# Patient Record
Sex: Female | Born: 1960
Health system: Southern US, Community
[De-identification: ages and names within clinical notes are randomized; demographics above are authoritative.]

## PROBLEM LIST (undated history)

## (undated) DIAGNOSIS — F32A Depression, unspecified: Secondary | ICD-10-CM

## (undated) DIAGNOSIS — K219 Gastro-esophageal reflux disease without esophagitis: Secondary | ICD-10-CM

## (undated) DIAGNOSIS — F319 Bipolar disorder, unspecified: Secondary | ICD-10-CM

## (undated) DIAGNOSIS — F419 Anxiety disorder, unspecified: Secondary | ICD-10-CM

## (undated) DIAGNOSIS — F329 Major depressive disorder, single episode, unspecified: Secondary | ICD-10-CM

## (undated) DIAGNOSIS — R0902 Hypoxemia: Secondary | ICD-10-CM

## (undated) DIAGNOSIS — T7840XA Allergy, unspecified, initial encounter: Secondary | ICD-10-CM

## (undated) DIAGNOSIS — I499 Cardiac arrhythmia, unspecified: Secondary | ICD-10-CM

## (undated) DIAGNOSIS — M1990 Unspecified osteoarthritis, unspecified site: Secondary | ICD-10-CM

## (undated) DIAGNOSIS — I219 Acute myocardial infarction, unspecified: Secondary | ICD-10-CM

## (undated) DIAGNOSIS — C801 Malignant (primary) neoplasm, unspecified: Secondary | ICD-10-CM

## (undated) DIAGNOSIS — F5105 Insomnia due to other mental disorder: Secondary | ICD-10-CM

## (undated) DIAGNOSIS — IMO0002 Reserved for concepts with insufficient information to code with codable children: Secondary | ICD-10-CM

## (undated) DIAGNOSIS — D689 Coagulation defect, unspecified: Secondary | ICD-10-CM

## (undated) DIAGNOSIS — E785 Hyperlipidemia, unspecified: Secondary | ICD-10-CM

## (undated) DIAGNOSIS — Z5189 Encounter for other specified aftercare: Secondary | ICD-10-CM

## (undated) DIAGNOSIS — D649 Anemia, unspecified: Secondary | ICD-10-CM

## (undated) DIAGNOSIS — M6740 Ganglion, unspecified site: Secondary | ICD-10-CM

## (undated) DIAGNOSIS — E039 Hypothyroidism, unspecified: Secondary | ICD-10-CM

## (undated) DIAGNOSIS — N189 Chronic kidney disease, unspecified: Secondary | ICD-10-CM

## (undated) DIAGNOSIS — Z800 Family history of malignant neoplasm of digestive organs: Secondary | ICD-10-CM

## (undated) DIAGNOSIS — G4730 Sleep apnea, unspecified: Secondary | ICD-10-CM

## (undated) HISTORY — DX: Unspecified osteoarthritis, unspecified site: M19.90

## (undated) HISTORY — PX: ABDOMINAL HYSTERECTOMY: SHX81

## (undated) HISTORY — DX: Allergy, unspecified, initial encounter: T78.40XA

## (undated) HISTORY — DX: Acute myocardial infarction, unspecified: I21.9

## (undated) HISTORY — DX: Hypoxemia: R09.02

## (undated) HISTORY — DX: Encounter for other specified aftercare: Z51.89

## (undated) HISTORY — PX: ABDOMINAL HYSTERECTOMY: SUR658

## (undated) HISTORY — PX: COLON SURGERY: SHX602

## (undated) HISTORY — DX: Insomnia due to other mental disorder: F51.05

## (undated) HISTORY — DX: Family history of malignant neoplasm of digestive organs: Z80.0

## (undated) HISTORY — PX: KNEE DISLOCATION SURGERY: SHX689

## (undated) HISTORY — PX: BREAST SURGERY: SHX581

## (undated) HISTORY — DX: Coagulation defect, unspecified: D68.9

## (undated) HISTORY — PX: HERNIA REPAIR: SHX51

## (undated) HISTORY — DX: Malignant (primary) neoplasm, unspecified: C80.1

## (undated) HISTORY — DX: Reserved for concepts with insufficient information to code with codable children: IMO0002

## (undated) HISTORY — DX: Sleep apnea, unspecified: G47.30

## (undated) HISTORY — DX: Gastro-esophageal reflux disease without esophagitis: K21.9

## (undated) HISTORY — DX: Hypothyroidism, unspecified: E03.9

## (undated) HISTORY — DX: Ganglion, unspecified site: M67.40

## (undated) HISTORY — PX: COLONOSCOPY: SHX174

## (undated) HISTORY — DX: Bipolar disorder, unspecified: F31.9

## (undated) HISTORY — DX: Chronic kidney disease, unspecified: N18.9

## (undated) HISTORY — DX: Depression, unspecified: F32.A

## (undated) HISTORY — DX: Anemia, unspecified: D64.9

## (undated) HISTORY — PX: GANGLION CYST EXCISION: SHX1691

## (undated) HISTORY — DX: Hyperlipidemia, unspecified: E78.5

## (undated) HISTORY — DX: Anxiety disorder, unspecified: F41.9

## (undated) HISTORY — DX: Cardiac arrhythmia, unspecified: I49.9

## (undated) HISTORY — DX: Major depressive disorder, single episode, unspecified: F32.9

---

## 1995-02-14 HISTORY — PX: LAPAROSCOPY: SHX197

## 1998-02-24 ENCOUNTER — Encounter: Admission: RE | Admit: 1998-02-24 | Discharge: 1998-02-24 | Payer: Self-pay | Admitting: *Deleted

## 2001-09-24 ENCOUNTER — Encounter (INDEPENDENT_AMBULATORY_CARE_PROVIDER_SITE_OTHER): Payer: Self-pay | Admitting: *Deleted

## 2001-09-24 ENCOUNTER — Ambulatory Visit (HOSPITAL_BASED_OUTPATIENT_CLINIC_OR_DEPARTMENT_OTHER): Admission: RE | Admit: 2001-09-24 | Discharge: 2001-09-24 | Payer: Self-pay | Admitting: Orthopedic Surgery

## 2004-02-14 HISTORY — PX: DILATION AND CURETTAGE OF UTERUS: SHX78

## 2004-03-20 ENCOUNTER — Emergency Department (HOSPITAL_COMMUNITY): Admission: EM | Admit: 2004-03-20 | Discharge: 2004-03-20 | Payer: Self-pay | Admitting: Family Medicine

## 2004-03-28 ENCOUNTER — Inpatient Hospital Stay (HOSPITAL_COMMUNITY): Admission: AD | Admit: 2004-03-28 | Discharge: 2004-03-28 | Payer: Self-pay | Admitting: Obstetrics & Gynecology

## 2004-03-29 ENCOUNTER — Inpatient Hospital Stay (HOSPITAL_COMMUNITY): Admission: AD | Admit: 2004-03-29 | Discharge: 2004-03-29 | Payer: Self-pay | Admitting: Obstetrics and Gynecology

## 2004-04-07 ENCOUNTER — Encounter (INDEPENDENT_AMBULATORY_CARE_PROVIDER_SITE_OTHER): Payer: Self-pay | Admitting: *Deleted

## 2004-04-07 ENCOUNTER — Ambulatory Visit (HOSPITAL_COMMUNITY): Admission: RE | Admit: 2004-04-07 | Discharge: 2004-04-07 | Payer: Self-pay | Admitting: Obstetrics and Gynecology

## 2004-10-28 ENCOUNTER — Ambulatory Visit: Payer: Self-pay | Admitting: Family Medicine

## 2004-11-08 ENCOUNTER — Ambulatory Visit: Payer: Self-pay | Admitting: Gastroenterology

## 2004-11-09 ENCOUNTER — Ambulatory Visit: Payer: Self-pay | Admitting: Gastroenterology

## 2004-11-14 ENCOUNTER — Emergency Department: Payer: Self-pay | Admitting: Emergency Medicine

## 2005-01-03 ENCOUNTER — Ambulatory Visit: Payer: Self-pay | Admitting: Family Medicine

## 2005-02-03 ENCOUNTER — Ambulatory Visit: Payer: Self-pay | Admitting: Family Medicine

## 2005-04-06 ENCOUNTER — Ambulatory Visit: Payer: Self-pay | Admitting: Family Medicine

## 2005-06-02 ENCOUNTER — Ambulatory Visit: Payer: Self-pay | Admitting: Family Medicine

## 2005-06-22 ENCOUNTER — Ambulatory Visit: Payer: Self-pay | Admitting: Family Medicine

## 2005-12-22 ENCOUNTER — Ambulatory Visit: Payer: Self-pay | Admitting: Family Medicine

## 2006-01-15 ENCOUNTER — Ambulatory Visit: Payer: Self-pay | Admitting: Family Medicine

## 2006-05-31 ENCOUNTER — Ambulatory Visit (HOSPITAL_COMMUNITY): Admission: RE | Admit: 2006-05-31 | Discharge: 2006-05-31 | Payer: Self-pay | Admitting: Obstetrics and Gynecology

## 2006-06-06 ENCOUNTER — Inpatient Hospital Stay (HOSPITAL_COMMUNITY): Admission: AD | Admit: 2006-06-06 | Discharge: 2006-06-06 | Payer: Self-pay | Admitting: Obstetrics and Gynecology

## 2006-08-13 ENCOUNTER — Telehealth (INDEPENDENT_AMBULATORY_CARE_PROVIDER_SITE_OTHER): Payer: Self-pay | Admitting: *Deleted

## 2006-09-12 ENCOUNTER — Ambulatory Visit: Payer: Self-pay | Admitting: Family Medicine

## 2006-09-12 DIAGNOSIS — J301 Allergic rhinitis due to pollen: Secondary | ICD-10-CM | POA: Insufficient documentation

## 2006-09-12 DIAGNOSIS — E039 Hypothyroidism, unspecified: Secondary | ICD-10-CM | POA: Insufficient documentation

## 2006-09-12 DIAGNOSIS — E119 Type 2 diabetes mellitus without complications: Secondary | ICD-10-CM | POA: Insufficient documentation

## 2006-09-12 DIAGNOSIS — F418 Other specified anxiety disorders: Secondary | ICD-10-CM | POA: Insufficient documentation

## 2006-09-12 DIAGNOSIS — N6019 Diffuse cystic mastopathy of unspecified breast: Secondary | ICD-10-CM | POA: Insufficient documentation

## 2006-09-12 DIAGNOSIS — Z87898 Personal history of other specified conditions: Secondary | ICD-10-CM | POA: Insufficient documentation

## 2006-09-12 DIAGNOSIS — E118 Type 2 diabetes mellitus with unspecified complications: Secondary | ICD-10-CM | POA: Insufficient documentation

## 2006-09-12 DIAGNOSIS — E1169 Type 2 diabetes mellitus with other specified complication: Secondary | ICD-10-CM | POA: Insufficient documentation

## 2006-11-07 ENCOUNTER — Encounter (INDEPENDENT_AMBULATORY_CARE_PROVIDER_SITE_OTHER): Payer: Self-pay | Admitting: Surgery

## 2006-11-07 ENCOUNTER — Ambulatory Visit (HOSPITAL_BASED_OUTPATIENT_CLINIC_OR_DEPARTMENT_OTHER): Admission: RE | Admit: 2006-11-07 | Discharge: 2006-11-07 | Payer: Self-pay | Admitting: Surgery

## 2006-12-12 ENCOUNTER — Inpatient Hospital Stay (HOSPITAL_COMMUNITY): Admission: RE | Admit: 2006-12-12 | Discharge: 2006-12-15 | Payer: Self-pay | Admitting: Obstetrics and Gynecology

## 2006-12-12 ENCOUNTER — Encounter (INDEPENDENT_AMBULATORY_CARE_PROVIDER_SITE_OTHER): Payer: Self-pay | Admitting: Obstetrics and Gynecology

## 2007-01-02 ENCOUNTER — Ambulatory Visit: Payer: Self-pay | Admitting: Family Medicine

## 2007-02-08 ENCOUNTER — Ambulatory Visit: Payer: Self-pay | Admitting: Family Medicine

## 2007-02-08 LAB — CONVERTED CEMR LAB
Bilirubin Urine: NEGATIVE
Nitrite: NEGATIVE
Specific Gravity, Urine: 1.025
Urobilinogen, UA: 0.2
WBC Urine, dipstick: NEGATIVE
pH: 5

## 2007-02-11 ENCOUNTER — Telehealth: Payer: Self-pay | Admitting: Family Medicine

## 2007-06-10 ENCOUNTER — Encounter: Payer: Self-pay | Admitting: Family Medicine

## 2007-06-14 ENCOUNTER — Telehealth: Payer: Self-pay | Admitting: Family Medicine

## 2007-06-17 ENCOUNTER — Ambulatory Visit: Payer: Self-pay | Admitting: Family Medicine

## 2007-06-17 LAB — CONVERTED CEMR LAB
Bacteria, UA: 0
Bilirubin Urine: NEGATIVE
Blood in Urine, dipstick: NEGATIVE
Casts: 0 /lpf
Epithelial cells, urine: 0 /lpf
Glucose, Urine, Semiquant: NEGATIVE
Nitrite: NEGATIVE
RBC / HPF: 1
Specific Gravity, Urine: >=1.03
pH: 6

## 2007-06-18 ENCOUNTER — Encounter: Payer: Self-pay | Admitting: Family Medicine

## 2007-07-01 ENCOUNTER — Telehealth: Payer: Self-pay | Admitting: Family Medicine

## 2007-07-05 ENCOUNTER — Ambulatory Visit: Payer: Self-pay | Admitting: Family Medicine

## 2007-08-02 ENCOUNTER — Ambulatory Visit: Payer: Self-pay | Admitting: Family Medicine

## 2007-08-03 LAB — CONVERTED CEMR LAB
AST: 31 units/L (ref 0–37)
Albumin: 3.8 g/dL (ref 3.5–5.2)
BUN: 13 mg/dL (ref 6–23)
Calcium: 9.8 mg/dL (ref 8.4–10.5)
Glucose, Bld: 120 mg/dL — ABNORMAL HIGH (ref 70–99)
HCT: 39.4 % (ref 36.0–46.0)
Hgb A1c MFr Bld: 6.5 % — ABNORMAL HIGH (ref 4.6–6.0)
Lymphocytes Relative: 36.1 % (ref 12.0–46.0)
Monocytes Absolute: 0.3 10*3/uL (ref 0.1–1.0)
Monocytes Relative: 5.5 % (ref 3.0–12.0)
Neutro Abs: 2.8 10*3/uL (ref 1.4–7.7)
Neutrophils Relative %: 55 % (ref 43.0–77.0)
Phosphorus: 4.7 mg/dL — ABNORMAL HIGH (ref 2.3–4.6)
Platelets: 247 10*3/uL (ref 150–400)
Sodium: 140 meq/L (ref 135–145)
Total CHOL/HDL Ratio: 6.9
Triglycerides: 162 mg/dL — ABNORMAL HIGH (ref 0–149)

## 2007-08-06 ENCOUNTER — Ambulatory Visit: Payer: Self-pay | Admitting: Professional

## 2008-02-14 DIAGNOSIS — C20 Malignant neoplasm of rectum: Secondary | ICD-10-CM | POA: Insufficient documentation

## 2008-06-08 ENCOUNTER — Ambulatory Visit: Payer: Self-pay | Admitting: Family Medicine

## 2008-06-08 LAB — CONVERTED CEMR LAB
Bilirubin Urine: NEGATIVE
Nitrite: NEGATIVE
Protein, U semiquant: NEGATIVE
Specific Gravity, Urine: 1.015
Urobilinogen, UA: 0.2
WBC Urine, dipstick: NEGATIVE

## 2008-06-12 LAB — CONVERTED CEMR LAB
ALT: 17 units/L (ref 0–35)
Cholesterol: 215 mg/dL — ABNORMAL HIGH (ref 0–200)
Creatinine, Ser: 0.7 mg/dL (ref 0.4–1.2)
Glucose, Bld: 107 mg/dL — ABNORMAL HIGH (ref 70–99)
HDL: 37.2 mg/dL — ABNORMAL LOW (ref >39.00)
Sodium: 144 meq/L (ref 135–145)
TSH: 0.15 microintl units/mL — ABNORMAL LOW (ref 0.35–5.50)
Total CHOL/HDL Ratio: 6

## 2008-06-15 ENCOUNTER — Encounter: Admission: RE | Admit: 2008-06-15 | Discharge: 2008-09-13 | Payer: Self-pay | Admitting: Family Medicine

## 2008-06-19 ENCOUNTER — Ambulatory Visit: Payer: Self-pay | Admitting: Family Medicine

## 2008-06-19 LAB — CONVERTED CEMR LAB
Bilirubin Urine: NEGATIVE
Blood in Urine, dipstick: NEGATIVE
Ketones, urine, test strip: NEGATIVE
Nitrite: NEGATIVE
Urobilinogen, UA: 0.2
pH: 5

## 2008-06-24 LAB — CONVERTED CEMR LAB
Basophils Relative: 0.7 % (ref 0.0–3.0)
Eosinophils Absolute: 0.3 10*3/uL (ref 0.0–0.7)
Eosinophils Relative: 3.6 % (ref 0.0–5.0)
HCT: 37.9 % (ref 36.0–46.0)
Hemoglobin: 12.9 g/dL (ref 12.0–15.0)
Lymphocytes Relative: 32.5 % (ref 12.0–46.0)
MCV: 89.7 fL (ref 78.0–100.0)
Neutrophils Relative %: 59.3 % (ref 43.0–77.0)

## 2008-07-08 ENCOUNTER — Ambulatory Visit: Payer: Self-pay | Admitting: Family Medicine

## 2008-07-08 DIAGNOSIS — E785 Hyperlipidemia, unspecified: Secondary | ICD-10-CM

## 2008-07-08 DIAGNOSIS — E1169 Type 2 diabetes mellitus with other specified complication: Secondary | ICD-10-CM | POA: Insufficient documentation

## 2008-07-10 LAB — CONVERTED CEMR LAB: TSH: 3.54 microintl units/mL (ref 0.35–5.50)

## 2008-07-14 HISTORY — PX: COLON RESECTION: SHX5231

## 2008-07-16 ENCOUNTER — Ambulatory Visit: Payer: Self-pay | Admitting: Gastroenterology

## 2008-07-23 ENCOUNTER — Telehealth: Payer: Self-pay | Admitting: Gastroenterology

## 2008-07-28 ENCOUNTER — Ambulatory Visit: Payer: Self-pay | Admitting: Gastroenterology

## 2008-08-03 ENCOUNTER — Telehealth: Payer: Self-pay | Admitting: Gastroenterology

## 2008-08-03 ENCOUNTER — Ambulatory Visit: Payer: Self-pay | Admitting: Gastroenterology

## 2008-08-03 ENCOUNTER — Encounter: Payer: Self-pay | Admitting: Gastroenterology

## 2008-08-04 ENCOUNTER — Telehealth: Payer: Self-pay | Admitting: Gastroenterology

## 2008-08-05 ENCOUNTER — Ambulatory Visit: Payer: Self-pay | Admitting: Cardiology

## 2008-08-11 ENCOUNTER — Ambulatory Visit: Payer: Self-pay | Admitting: Gastroenterology

## 2008-08-11 ENCOUNTER — Encounter: Payer: Self-pay | Admitting: Family Medicine

## 2008-08-11 DIAGNOSIS — C189 Malignant neoplasm of colon, unspecified: Secondary | ICD-10-CM | POA: Insufficient documentation

## 2008-08-11 DIAGNOSIS — C187 Malignant neoplasm of sigmoid colon: Secondary | ICD-10-CM | POA: Insufficient documentation

## 2008-08-11 DIAGNOSIS — Z85038 Personal history of other malignant neoplasm of large intestine: Secondary | ICD-10-CM | POA: Insufficient documentation

## 2008-08-13 ENCOUNTER — Encounter: Payer: Self-pay | Admitting: Family Medicine

## 2008-08-18 ENCOUNTER — Encounter (INDEPENDENT_AMBULATORY_CARE_PROVIDER_SITE_OTHER): Payer: Self-pay | Admitting: Surgery

## 2008-08-18 ENCOUNTER — Inpatient Hospital Stay (HOSPITAL_COMMUNITY): Admission: RE | Admit: 2008-08-18 | Discharge: 2008-08-26 | Payer: Self-pay | Admitting: Surgery

## 2008-08-28 ENCOUNTER — Ambulatory Visit: Payer: Self-pay | Admitting: Genetic Counselor

## 2008-08-29 ENCOUNTER — Emergency Department (HOSPITAL_COMMUNITY): Admission: EM | Admit: 2008-08-29 | Discharge: 2008-08-29 | Payer: Self-pay | Admitting: Emergency Medicine

## 2008-09-01 ENCOUNTER — Encounter: Payer: Self-pay | Admitting: Gastroenterology

## 2008-09-01 ENCOUNTER — Ambulatory Visit: Payer: Self-pay | Admitting: Oncology

## 2008-09-07 ENCOUNTER — Ambulatory Visit: Payer: Self-pay | Admitting: Family Medicine

## 2008-09-08 LAB — CONVERTED CEMR LAB
Albumin: 3.8 g/dL (ref 3.5–5.2)
Calcium: 9.9 mg/dL (ref 8.4–10.5)
Cholesterol: 197 mg/dL (ref 0–200)
Creatinine, Ser: 0.7 mg/dL (ref 0.4–1.2)
Glucose, Bld: 126 mg/dL — ABNORMAL HIGH (ref 70–99)
Hgb A1c MFr Bld: 6.4 % (ref 4.6–6.5)
Phosphorus: 4.3 mg/dL (ref 2.3–4.6)
Total CHOL/HDL Ratio: 6
Triglycerides: 248 mg/dL — ABNORMAL HIGH (ref 0.0–149.0)

## 2008-09-11 ENCOUNTER — Ambulatory Visit: Payer: Self-pay | Admitting: Family Medicine

## 2008-09-14 ENCOUNTER — Ambulatory Visit (HOSPITAL_COMMUNITY): Admission: RE | Admit: 2008-09-14 | Discharge: 2008-09-14 | Payer: Self-pay | Admitting: Gastroenterology

## 2008-09-17 ENCOUNTER — Encounter: Payer: Self-pay | Admitting: Family Medicine

## 2008-09-17 LAB — CBC WITH DIFFERENTIAL/PLATELET
Eosinophils Absolute: 0.2 10*3/uL (ref 0.0–0.5)
HCT: 36.3 % (ref 34.8–46.6)
LYMPH%: 35.9 % (ref 14.0–49.7)
MONO#: 0.3 10*3/uL (ref 0.1–0.9)
NEUT#: 3.6 10*3/uL (ref 1.5–6.5)
NEUT%: 56.5 % (ref 38.4–76.8)
Platelets: 270 10*3/uL (ref 145–400)
WBC: 6.4 10*3/uL (ref 3.9–10.3)

## 2008-09-17 LAB — COMPREHENSIVE METABOLIC PANEL
BUN: 14 mg/dL (ref 6–23)
CO2: 28 mEq/L (ref 19–32)
Creatinine, Ser: 0.57 mg/dL (ref 0.40–1.20)
Glucose, Bld: 143 mg/dL — ABNORMAL HIGH (ref 70–99)
Total Bilirubin: 0.5 mg/dL (ref 0.3–1.2)
Total Protein: 7 g/dL (ref 6.0–8.3)

## 2008-09-17 LAB — LACTATE DEHYDROGENASE: LDH: 97 U/L (ref 94–250)

## 2008-09-17 LAB — CEA: CEA: 1.3 ng/mL (ref 0.0–5.0)

## 2008-09-18 ENCOUNTER — Encounter: Payer: Self-pay | Admitting: Gastroenterology

## 2008-09-28 ENCOUNTER — Telehealth: Payer: Self-pay | Admitting: Gastroenterology

## 2008-09-29 ENCOUNTER — Encounter: Payer: Self-pay | Admitting: Gastroenterology

## 2008-10-01 ENCOUNTER — Ambulatory Visit: Payer: Self-pay | Admitting: Gastroenterology

## 2008-10-01 ENCOUNTER — Encounter (INDEPENDENT_AMBULATORY_CARE_PROVIDER_SITE_OTHER): Payer: Self-pay | Admitting: *Deleted

## 2008-10-02 ENCOUNTER — Encounter: Admission: RE | Admit: 2008-10-02 | Discharge: 2008-10-02 | Payer: Self-pay | Admitting: Surgery

## 2008-10-02 ENCOUNTER — Encounter (INDEPENDENT_AMBULATORY_CARE_PROVIDER_SITE_OTHER): Payer: Self-pay | Admitting: *Deleted

## 2008-10-05 ENCOUNTER — Encounter: Payer: Self-pay | Admitting: Gastroenterology

## 2008-10-09 ENCOUNTER — Encounter: Payer: Self-pay | Admitting: Gastroenterology

## 2008-10-14 ENCOUNTER — Ambulatory Visit: Payer: Self-pay | Admitting: Genetic Counselor

## 2008-10-14 ENCOUNTER — Encounter: Payer: Self-pay | Admitting: Gastroenterology

## 2008-11-10 ENCOUNTER — Encounter: Payer: Self-pay | Admitting: Gastroenterology

## 2008-11-27 ENCOUNTER — Encounter: Payer: Self-pay | Admitting: Gastroenterology

## 2008-12-11 ENCOUNTER — Ambulatory Visit: Payer: Self-pay | Admitting: Family Medicine

## 2008-12-11 ENCOUNTER — Telehealth (INDEPENDENT_AMBULATORY_CARE_PROVIDER_SITE_OTHER): Payer: Self-pay | Admitting: *Deleted

## 2008-12-15 LAB — CONVERTED CEMR LAB
ALT: 16 units/L (ref 0–35)
AST: 16 units/L (ref 0–37)
BUN: 12 mg/dL (ref 6–23)
CO2: 29 meq/L (ref 19–32)
Chloride: 105 meq/L (ref 96–112)
Cholesterol: 201 mg/dL — ABNORMAL HIGH (ref 0–200)
Direct LDL: 147.2 mg/dL
Potassium: 4.6 meq/L (ref 3.5–5.1)
Sodium: 144 meq/L (ref 135–145)
VLDL: 23.4 mg/dL (ref 0.0–40.0)

## 2008-12-25 ENCOUNTER — Encounter: Admission: RE | Admit: 2008-12-25 | Discharge: 2008-12-25 | Payer: Self-pay | Admitting: Surgery

## 2008-12-28 ENCOUNTER — Encounter (INDEPENDENT_AMBULATORY_CARE_PROVIDER_SITE_OTHER): Payer: Self-pay | Admitting: *Deleted

## 2008-12-28 ENCOUNTER — Encounter: Admission: RE | Admit: 2008-12-28 | Discharge: 2008-12-28 | Payer: Self-pay | Admitting: Surgery

## 2009-01-05 ENCOUNTER — Encounter: Payer: Self-pay | Admitting: Gastroenterology

## 2009-01-05 ENCOUNTER — Telehealth: Payer: Self-pay | Admitting: Gastroenterology

## 2009-01-09 ENCOUNTER — Encounter: Payer: Self-pay | Admitting: Family Medicine

## 2009-01-13 ENCOUNTER — Ambulatory Visit: Payer: Self-pay | Admitting: Oncology

## 2009-01-14 ENCOUNTER — Ambulatory Visit: Payer: Self-pay | Admitting: Gastroenterology

## 2009-01-15 ENCOUNTER — Ambulatory Visit: Payer: Self-pay | Admitting: Gastroenterology

## 2009-01-15 LAB — HM SIGMOIDOSCOPY

## 2009-01-25 ENCOUNTER — Encounter: Payer: Self-pay | Admitting: Family Medicine

## 2009-01-25 LAB — COMPREHENSIVE METABOLIC PANEL
ALT: 11 U/L (ref 0–35)
AST: 15 U/L (ref 0–37)
Albumin: 4 g/dL (ref 3.5–5.2)
BUN: 15 mg/dL (ref 6–23)
Calcium: 9.1 mg/dL (ref 8.4–10.5)
Chloride: 103 mEq/L (ref 96–112)
Potassium: 4.1 mEq/L (ref 3.5–5.3)

## 2009-01-25 LAB — CBC WITH DIFFERENTIAL/PLATELET
BASO%: 0.7 % (ref 0.0–2.0)
Eosinophils Absolute: 0.1 10*3/uL (ref 0.0–0.5)
HCT: 36.3 % (ref 34.8–46.6)
MCHC: 33.7 g/dL (ref 31.5–36.0)
MONO#: 0.2 10*3/uL (ref 0.1–0.9)
NEUT#: 4.1 10*3/uL (ref 1.5–6.5)
NEUT%: 61.7 % (ref 38.4–76.8)
WBC: 6.7 10*3/uL (ref 3.9–10.3)
lymph#: 2.2 10*3/uL (ref 0.9–3.3)

## 2009-01-25 LAB — LACTATE DEHYDROGENASE: LDH: 123 U/L (ref 94–250)

## 2009-01-26 ENCOUNTER — Encounter (INDEPENDENT_AMBULATORY_CARE_PROVIDER_SITE_OTHER): Payer: Self-pay | Admitting: *Deleted

## 2009-01-26 ENCOUNTER — Encounter: Payer: Self-pay | Admitting: Gastroenterology

## 2009-01-27 ENCOUNTER — Encounter: Payer: Self-pay | Admitting: Gastroenterology

## 2009-03-22 ENCOUNTER — Encounter: Admission: RE | Admit: 2009-03-22 | Discharge: 2009-03-22 | Payer: Self-pay | Admitting: Surgery

## 2009-03-25 ENCOUNTER — Encounter: Payer: Self-pay | Admitting: Gastroenterology

## 2009-03-30 ENCOUNTER — Inpatient Hospital Stay (HOSPITAL_COMMUNITY): Admission: RE | Admit: 2009-03-30 | Discharge: 2009-04-02 | Payer: Self-pay | Admitting: Surgery

## 2009-05-14 ENCOUNTER — Encounter: Payer: Self-pay | Admitting: Gastroenterology

## 2009-05-22 ENCOUNTER — Encounter: Payer: Self-pay | Admitting: Gastroenterology

## 2009-07-01 ENCOUNTER — Encounter: Admission: RE | Admit: 2009-07-01 | Discharge: 2009-07-01 | Payer: Self-pay | Admitting: Surgery

## 2009-07-02 ENCOUNTER — Telehealth: Payer: Self-pay | Admitting: Family Medicine

## 2009-07-15 ENCOUNTER — Telehealth: Payer: Self-pay | Admitting: Gastroenterology

## 2009-07-22 ENCOUNTER — Ambulatory Visit: Payer: Self-pay | Admitting: Oncology

## 2009-07-28 ENCOUNTER — Telehealth (INDEPENDENT_AMBULATORY_CARE_PROVIDER_SITE_OTHER): Payer: Self-pay | Admitting: *Deleted

## 2009-07-29 ENCOUNTER — Encounter (INDEPENDENT_AMBULATORY_CARE_PROVIDER_SITE_OTHER): Payer: Self-pay | Admitting: *Deleted

## 2009-09-02 ENCOUNTER — Encounter (INDEPENDENT_AMBULATORY_CARE_PROVIDER_SITE_OTHER): Payer: Self-pay | Admitting: *Deleted

## 2009-09-06 ENCOUNTER — Encounter (INDEPENDENT_AMBULATORY_CARE_PROVIDER_SITE_OTHER): Payer: Self-pay | Admitting: *Deleted

## 2009-09-06 ENCOUNTER — Ambulatory Visit: Payer: Self-pay | Admitting: Gastroenterology

## 2009-09-22 ENCOUNTER — Ambulatory Visit: Payer: Self-pay | Admitting: Gastroenterology

## 2009-09-22 LAB — HM COLONOSCOPY

## 2009-10-14 ENCOUNTER — Encounter: Payer: Self-pay | Admitting: Family Medicine

## 2009-10-14 LAB — HM MAMMOGRAPHY: HM Mammogram: NEGATIVE

## 2009-10-22 ENCOUNTER — Encounter: Payer: Self-pay | Admitting: Family Medicine

## 2009-12-06 ENCOUNTER — Telehealth: Payer: Self-pay | Admitting: Family Medicine

## 2009-12-07 ENCOUNTER — Encounter: Payer: Self-pay | Admitting: Family Medicine

## 2009-12-08 ENCOUNTER — Ambulatory Visit: Payer: Self-pay | Admitting: Family Medicine

## 2009-12-08 ENCOUNTER — Encounter: Payer: Self-pay | Admitting: Family Medicine

## 2009-12-09 ENCOUNTER — Encounter: Payer: Self-pay | Admitting: Family Medicine

## 2009-12-10 LAB — CONVERTED CEMR LAB
Albumin: 4.1 g/dL (ref 3.5–5.2)
Basophils Absolute: 0 10*3/uL (ref 0.0–0.1)
Basophils Relative: 0 % (ref 0–1)
Bilirubin, Direct: 0.1 mg/dL (ref 0.0–0.3)
Calcium: 9.3 mg/dL (ref 8.4–10.5)
Chloride: 103 meq/L (ref 96–112)
Eosinophils Absolute: 0.2 10*3/uL (ref 0.0–0.7)
HCT: 41.6 % (ref 36.0–46.0)
Hemoglobin: 13.4 g/dL (ref 12.0–15.0)
Indirect Bilirubin: 0.2 mg/dL (ref 0.0–0.9)
MCHC: 32.2 g/dL (ref 30.0–36.0)
MCV: 94.3 fL (ref 78.0–100.0)
Monocytes Absolute: 0.3 10*3/uL (ref 0.1–1.0)
Neutro Abs: 4.1 10*3/uL (ref 1.7–7.7)
Potassium: 4.7 meq/L (ref 3.5–5.3)
WBC: 6.8 10*3/uL (ref 4.0–10.5)

## 2009-12-13 ENCOUNTER — Encounter: Payer: Self-pay | Admitting: Family Medicine

## 2009-12-14 LAB — CONVERTED CEMR LAB
Cholesterol: 282 mg/dL — ABNORMAL HIGH (ref 0–200)
HDL: 41 mg/dL (ref >39)
Hgb A1c MFr Bld: 7.1 % — ABNORMAL HIGH (ref <5.7)
Total CHOL/HDL Ratio: 6.9

## 2009-12-16 ENCOUNTER — Encounter (INDEPENDENT_AMBULATORY_CARE_PROVIDER_SITE_OTHER): Payer: Self-pay | Admitting: *Deleted

## 2010-03-06 ENCOUNTER — Encounter: Payer: Self-pay | Admitting: *Deleted

## 2010-03-06 ENCOUNTER — Encounter (HOSPITAL_COMMUNITY): Payer: Self-pay | Admitting: Oncology

## 2010-03-14 ENCOUNTER — Ambulatory Visit: Admit: 2010-03-14 | Payer: Self-pay | Admitting: Family Medicine

## 2010-03-15 NOTE — Letter (Signed)
Summary: Generic Letter  [facility]  [address] [place], [place] [address]   Phone: [phone]  Fax: [phone]    12/16/2009    [facility] [address] [place], [place]  [address]     Dear Ms. Levingston,     Your mammogram was normal, please repeat this screening in one year.     Sincerely,   Liane Comber CMA (AAMA)

## 2010-03-15 NOTE — Procedures (Signed)
Summary: Colonoscopy  Patient: Kelli Cruz Note: All result statuses are Final unless otherwise noted.  Tests: (1) Colonoscopy (COL)   COL Colonoscopy           DONE     [facility]     [address]     [place], [place]  [address]           COLONOSCOPY PROCEDURE REPORT           PATIENT:  Kelli, Cruz  MR#:  [id number]     BIRTHDATE:  12/15/1960, 48 yrs. old  GENDER:  female           ENDOSCOPIST:  Barbette Hair. Arlyce Dice, MD     Referred by:           PROCEDURE DATE:  09/22/2009     PROCEDURE:  Diagnostic Colonoscopy     ASA CLASS:  Class II     INDICATIONS:  1) screening  2) history of colon cancer Colon Ca     dxed 6/10           MEDICATIONS:   Fentanyl 100 mcg IV, Versed 9 mg IV           DESCRIPTION OF PROCEDURE:   After the risks benefits and     alternatives of the procedure were thoroughly explained, informed     consent was obtained.  Digital rectal exam was performed and     revealed no abnormalities.   The LB CF-[id number] endoscope     was introduced through the anus and advanced to the cecum, which     was identified by the ileocecal valve, limited by poor     preparation.  Large amount of retained, liquid stool  The quality     of the prep was poor, using MoviPrep.  The instrument was then     slowly withdrawn as the colon was fully examined.     <<PROCEDUREIMAGES>>           FINDINGS:  A normal appearing cecum, ileocecal valve, and     appendiceal orifice were identified. The ascending, hepatic     flexure, transverse, splenic flexure, descending, sigmoid colon,     and rectum appeared unremarkable (see image1, image3, image4,     image5, image6, image7, image8, and image9).   Retroflexed views     in the rectum revealed Unable to retroflex.    The time to cecum =     5.30  minutes. The scope was then withdrawn (time =  6.5  min)     from the patient and the procedure completed.           COMPLICATIONS:  None           ENDOSCOPIC IMPRESSION:    1) Normal colon (limited due to poor prep)     RECOMMENDATIONS:     1) Colonoscopy in 1 year           REPEAT EXAM:  In 1 year(s) for Colonoscopy.           ______________________________     Barbette Hair. Arlyce Dice, MD           CC: Judy Pimple, MD, Kimberlee Nearing, MD           n.     Rosalie Doctor:   Barbette Hair. Kaplan at 09/22/2009 08:53 AM           Mcwilliams, [name], [id number]  Note: An exclamation mark (!) indicates  a result that was not dispersed into the flowsheet. Document Creation Date: 09/22/2009 8:54 AM _______________________________________________________________________  (1) Order result status: Final Collection or observation date-time: 09/22/2009 08:42 Requested date-time:  Receipt date-time:  Reported date-time:  Referring Physician:   Ordering Physician: Melvia Heaps [phone]) Specimen Source:  Source: Launa Grill Order Number: [id number] Lab site:   Appended Document: Colonoscopy    Clinical Lists Changes  Observations: Added new observation of COLONNXTDUE: 09/2010 (09/22/2009 12:45)

## 2010-03-15 NOTE — Letter (Signed)
Summary: [facility]   Imported By: Lester Luttrell 04/16/2009 08:35:44  _____________________________________________________________________  External Attachment:    Type:   Image     Comment:   External Document

## 2010-03-15 NOTE — Progress Notes (Signed)
Summary: Patient is due for colonoscopy  Phone Note Outgoing Call Call back at [facility] Phone [phone]   Call placed by: Harlow Mares CMA Duncan Dull),  July 15, 2009 9:11 AM Call placed to: Patient Summary of Call: Left message on patients machine to call back. patient is due for colonosocpy to follow up on her personal hx of colon cancer.  Initial call taken by: Harlow Mares CMA Duncan Dull),  July 15, 2009 9:12 AM  Follow-up for Phone Call        previsit scheduled for 08/27/2009, colonoscopy scheduled for 09/09/2009. Follow-up by: Harlow Mares CMA Duncan Dull),  July 26, 2009 2:59 PM

## 2010-03-15 NOTE — Progress Notes (Signed)
Summary: Metformin HCL 500mg  rx  Phone Note Refill Request Call back at [phone] Message from:  [facility] on Jul 02, 2009 1:08 PM  Refills Requested: Medication #1:  METFORMIN HCL 500 MG TABS 1 by mouth two times a day [facility] sent refill request for Metformin 500mg . Cannot see where our office has ever prescribed med according to med list. Also pt has not been seen by you since 09/11/08. No appt scheduled. Should pt be seen?Please advise.    Method Requested: Telephone to Pharmacy Initial call taken by: Lewanda Rife LPN,  Jul 02, 2009 1:10 PM  Follow-up for Phone Call        she has been out of the loop due to beingdx with colon cancer- and is now doing better schedule please fasting lab in mid summer lipid/ast/alt/renal / AIC 272, 250.0  then f/u px written on EMR for call in  Follow-up by: Judith Part MD,  Jul 02, 2009 1:25 PM  Additional Follow-up for Phone Call Additional follow up Details #1::        Medication phoned to [facility] pharmacy as instructed. Unable to reach pt by phone to schedule lab appt and then f/u appt with Dr Milinda Antis Mid summer. Irving Burton at [facility] will put note when pt picks up rx to call for appts.Lewanda Rife LPN  Jul 02, 2009 2:52 PM     New/Updated Medications: METFORMIN HCL 500 MG TABS (METFORMIN HCL) 1 by mouth two times a day Prescriptions: METFORMIN HCL 500 MG TABS (METFORMIN HCL) 1 by mouth two times a day  #60 x 11   Entered and Authorized by:   Judith Part MD   Signed by:   Lewanda Rife LPN on 98/12/9145   Method used:   Telephoned to ...       [id number] (retail)       [address]       [place], [place]  [address]       Ph: [phone] or [phone]       Fax: [phone]   RxID:   [id number]

## 2010-03-15 NOTE — Letter (Signed)
Summary: [facility]   Imported By: Lester Alta 02/17/2009 09:19:33  _____________________________________________________________________  External Attachment:    Type:   Image     Comment:   External Document

## 2010-03-15 NOTE — Letter (Signed)
[facility]   Imported By: Lester Prescott 06/03/2009 09:34:00  _____________________________________________________________________  External Attachment:    Type:   Image     Comment:   External Document

## 2010-03-15 NOTE — Progress Notes (Signed)
  Phone Note Outgoing Call   Call placed by: Clide Cliff RN,  July 28, 2009 10:39 AM Summary of Call: Called patient at  home due to NOS for previsit appointment, and there was no ID on the Voice Mail.  No message left.  Called work number and no one answered.  Will try calling again later.   Initial call taken by: Clide Cliff RN,  July 28, 2009 10:40 AM  Follow-up for Phone Call        Called both numbers and the same thing happened as earlier today.   Since the patient has a significant history, I will leave a note for the next previsit nurse to attempt to call her in the am.  I am reluctant to cancel her colonoscopy at this time. Follow-up by: Clide Cliff RN,  July 28, 2009 2:56 PM     Appended Document:  Called both phone numbers, no answer. Will cancel colonoscopy and send NOS letter.

## 2010-03-15 NOTE — Letter (Signed)
Summary: [facility]   Imported By: Lester Metcalf 06/03/2009 09:29:32  _____________________________________________________________________  External Attachment:    Type:   Image     Comment:   External Document

## 2010-03-15 NOTE — Letter (Signed)
Summary: Pre Visit No Show Letter  [facility]  [address] [place], [place] [address]   Phone: [phone]  Fax: [phone]        July 29, 2009 MRN: [id number]    [facility] [address] [place], [place]  [address]    Dear Kelli Cruz,   We have been unable to reach you by phone concerning the pre-procedure visit that you missed on 07/28/2009. For this reason,your procedure scheduled on Tuesday 08/10/2009 has been cancelled. Our scheduling staff will gladly assist you with rescheduling your appointments at a more convenient time. Please call our office at [phone] between the hours of 8:00am and 5:00pm, press option #2 to reach an appointment scheduler. Please consider updating your contact numbers at this time so that we can reach you by phone in the future with schedule changes or results.    Thank you,    Ezra Sites RN [facility]

## 2010-03-15 NOTE — Letter (Signed)
Summary: Moviprep Instructions  [facility]  [address]   [place], [place] [address]   Phone: [phone]  Fax: [phone]       Kelli Cruz    [birth year]    MRN: [id number]        Procedure Day Dorna Bloom: Wednesday, 09-22-09     Arrival Time: 7:30 a.m.      Procedure Time: 8:00 a.m.     Location of Procedure:                     x   [facility] (4th Floor)  PREPARATION FOR COLONOSCOPY WITH MOVIPREP   Starting 5 days prior to your procedure 09-17-09 do not eat nuts, seeds, popcorn, corn, beans, peas,  salads, or any raw vegetables.  Do not take any fiber supplements (e.g. Metamucil, Citrucel, and Benefiber).  THE DAY BEFORE YOUR PROCEDURE         DATE: 09-21-09   DAY: Tuesday  1.  Drink clear liquids the entire day-NO SOLID FOOD  2.  Do not drink anything colored red or purple.  Avoid juices with pulp.  No orange juice.  3.  Drink at least 64 oz. (8 glasses) of fluid/clear liquids during the day to prevent dehydration and help the prep work efficiently.  CLEAR LIQUIDS INCLUDE: Water Jello Ice Popsicles Tea (sugar ok, no milk/cream) Powdered fruit flavored drinks Coffee (sugar ok, no milk/cream) Gatorade Juice: apple, white grape, white cranberry  Lemonade Clear bullion, consomm, broth Carbonated beverages (any kind) Strained chicken noodle soup Hard Candy                             4.  In the morning, mix first dose of MoviPrep solution:    Empty 1 Pouch A and 1 Pouch B into the disposable container    Add lukewarm drinking water to the top line of the container. Mix to dissolve    Refrigerate (mixed solution should be used within 24 hrs)  5.  Begin drinking the prep at 5:00 p.m. The MoviPrep container is divided by 4 marks.   Every 15 minutes drink the solution down to the next mark (approximately 8 oz) until the full liter is complete.   6.  Follow completed prep with 16 oz of clear liquid of your choice (Nothing red or purple).   Continue to drink clear liquids until bedtime.  7.  Before going to bed, mix second dose of MoviPrep solution:    Empty 1 Pouch A and 1 Pouch B into the disposable container    Add lukewarm drinking water to the top line of the container. Mix to dissolve    Refrigerate  THE DAY OF YOUR PROCEDURE      DATE: 09-22-09  DAY: Wednesday  Beginning at 3:00 a.m. (5 hours before procedure):         1. Every 15 minutes, drink the solution down to the next mark (approx 8 oz) until the full liter is complete.  2. Follow completed prep with 16 oz. of clear liquid of your choice.    3. You may drink clear liquids until 6:00 a.m. (2 HOURS BEFORE PROCEDURE).   MEDICATION INSTRUCTIONS  Unless otherwise instructed, you should take regular prescription medications with a small sip of water   as early as possible the morning of your procedure.  Diabetic patients - see separate instructions.  OTHER INSTRUCTIONS  You will need a responsible adult at least 48 years of age to accompany you and drive you home.   This person must remain in the waiting room during your procedure.  Wear loose fitting clothing that is easily removed.  Leave jewelry and other valuables at home.  However, you may wish to bring a book to read or  an iPod/MP3 player to listen to music as you wait for your procedure to start.  Remove all body piercing jewelry and leave at home.  Total time from sign-in until discharge is approximately 2-3 hours.  You should go home directly after your procedure and rest.  You can resume normal activities the  day after your procedure.  The day of your procedure you should not:   Drive   Make legal decisions   Operate machinery   Drink alcohol   Return to work  You will receive specific instructions about eating, activities and medications before you leave.    The above instructions have been reviewed and explained to me by   Ezra Sites RN  September 06, 2009  10:28 AM     I fully understand and can verbalize these instructions _____________________________ Date _________

## 2010-03-15 NOTE — Progress Notes (Signed)
Summary: Lab Work  Phone Note Call from Patient   Caller: Patient Summary of Call: Patient called in this morning wanting to get a CPX. Dr. Milinda Antis had a cancellation this Lulu Riding so the patient took it. Patient would like to go the [place] office to have blood work done tomorrow morning on her way to work in [place] instead of having to drive all the way out here first. I spoke with [facility] office and they ok'd it. Thet just need the lab orders put into the chart so they can print them off and have time. Please advise.  Initial call taken by: Harold Barban,  December 06, 2009 1:42 PM  Follow-up for Phone Call        that sounds good please check wellness/ lipid/ AIC and microalbumin v70.0, 244.9, 250.0 thanks  Follow-up by: Judith Part MD,  December 06, 2009 4:59 PM  Additional Follow-up for Phone Call Additional follow up Details #1::        I tried to call order to [facility] office but they had closed. Left message on pt's cell # that order was in the EMR system for when she goes to [place] in AM. Will try again later.Lewanda Rife LPN  December 06, 2009 5:16 PM   Spoke with pt this AM. Pt did get my message on 12/06/09. I tried to call [facility] office  and got v/m  and I left v/m for the lab to call me.Lewanda Rife LPN  December 07, 2009 8:04 AM     Additional Follow-up for Phone Call Additional follow up Details #2::    Rodney Booze put lab order in system and [place] lab said they received.Lewanda Rife LPN  December 07, 2009 11:59 AM    Appended Document: Lab Work Cordelia Pen from [employer] called and when tasha put in orders from the phone note the cholesterol and A1c was omitted. sherry can add on those test. the microalbumin was also not ordered and cannot be done because a urine was not collected.  Appended Document: Lab Work thanks- can skip the microalb until next time  Appended Document: Lab Work Not sure what happened with this patient. I don't recall  ever ordering anything from [facility] b/c pt did not have labs here. I did order all labs according to phone note on 12/06/09 (see under orders tab)  I had initally mistakenly ordered for our lab but I canceled that and reordered in computer, all tests were included when I ordered. I don't recall the specifics ie if was the pt given the order or not but I did no do anything besides order in our system, not [facility]. Thanks Rodney Booze

## 2010-03-15 NOTE — Miscellaneous (Signed)
Summary: [facility]  Clinical Lists Changes  Medications: Added new medication of MOVIPREP 100 GM  SOLR (PEG-KCL-NACL-NASULF-NA ASC-C) As per prep instructions. - Signed Rx of MOVIPREP 100 GM  SOLR (PEG-KCL-NACL-NASULF-NA ASC-C) As per prep instructions.;  #1 x 0;  Signed;  Entered by: Ezra Sites RN;  Authorized by: Louis Meckel MD;  Method used: Electronically to [id number] [address] [place], [place]  [address], Ph: [phone] or [phone], Fax: [phone] Observations: Added new observation of ALLERGY REV: Done (09/06/2009 10:02)    Prescriptions: MOVIPREP 100 GM  SOLR (PEG-KCL-NACL-NASULF-NA ASC-C) As per prep instructions.  #1 x 0   Entered by:   Ezra Sites RN   Authorized by:   Louis Meckel MD   Signed by:   Ezra Sites RN on 09/06/2009   Method used:   Electronically to        [facility] (retail)       [address]       [place], [place]  [address]       Ph: [phone] or [phone]       Fax: [phone]   RxID:   [id number]

## 2010-03-15 NOTE — Letter (Signed)
Summary: Diabetic Instructions  [facility]  [address] [place], [place] [address]   Phone: [phone]  Fax: [phone]    Kelli Cruz 1960-11-10 MRN: [id number]   _  _   ORAL DIABETIC MEDICATION INSTRUCTIONS  The day before your procedure:   Take your diabetic pill as you do normally  The day of your procedure:   Do not take your diabetic pill    We will check your blood sugar levels during the admission process and again in Recovery before discharging you home  ________________________________________________________________________

## 2010-03-15 NOTE — Assessment & Plan Note (Signed)
Summary: cpx//lch   Vital Signs:  Patient profile:   50 year old female Height:      62.25 inches Weight:      211.75 pounds BMI:     38.56 Temp:     98.2 degrees F oral Pulse rate:   80 / minute Pulse rhythm:   regular BP sitting:   116 / 74  (left arm) Cuff size:   regular  Vitals Entered By: Lewanda Rife LPN (December 08, 2009 2:20 PM) CC: CPX LMP Hyst complete 2007   History of Present Illness: here for wellness exam and to disc chronic med problems  wt is up 16 lb  bp 116/74  Dm- pend AIc  sugars have been running in 150s-160s usually in the am  out of metfomin  thyroid--tsh is very high  ran out of her thyroid med -- and tired and gaining weight  wanted to see what would happen if she stopped taking it  wants to eat all the time   tot hyst for endometriosis no symptoms or problems  missed her gyn appt    mam 12/06 had a mammogram done - at gyn office  m with breast ca   colon 8/11  flu shot   Td06  lost her mother  has been close with her family   mentally doing pretty well - mental health  some grief -- and some relief also   also a lot going on with her job - her boss was fired   pend chol -- off zocor so it will be high     Allergies: 1)  Hydrocodone  Past History:  Past Medical History: Anxiety Depression ? bipolar diz Diabetes mellitus, type II Hypothyroidism endometriosis colon cancer - surgery  psychiatry- Dr Waverly [facility]  Review of Systems General:  Complains of fatigue; denies loss of appetite and malaise. Eyes:  Denies blurring and eye irritation. CV:  Denies chest pain or discomfort, fatigue, palpitations, shortness of breath with exertion, and swelling of feet. Resp:  Denies cough, shortness of breath, sputum productive, and wheezing. GI:  Complains of indigestion; one episode of heartburn last week. GU:  Denies discharge, dysuria, and urinary frequency. MS:  Denies muscle aches and cramps. Derm:  Denies itching,  lesion(s), poor wound healing, and rash. Neuro:  Denies headaches, numbness, and tingling. Psych:  Denies panic attacks, sense of great danger, and suicidal thoughts/plans. Endo:  Denies cold intolerance, excessive thirst, excessive urination, and heat intolerance. Heme:  Denies abnormal bruising, bleeding, and enlarge lymph nodes.  Physical Exam  General:  overweight but generally well appearing-- wt gain noted   Head:  normocephalic, atraumatic, and no abnormalities observed.   Eyes:  vision grossly intact, pupils equal, pupils round, and pupils reactive to light.  no conjunctival pallor, injection or icterus  Mouth:  pharynx pink and moist.   Neck:  supple with full rom and no masses or thyromegally, no JVD or carotid bruit  Chest Wall:  No deformities, masses, or tenderness noted. Breasts:  No mass, nodules, thickening, tenderness, bulging, retraction, inflamation, nipple discharge or skin changes noted.   Lungs:  Normal respiratory effort, chest expands symmetrically. Lungs are clear to auscultation, no crackles or wheezes. Heart:  Normal rate and regular rhythm. S1 and S2 normal without gallop, murmur, click, rub or other extra sounds. Abdomen:  Bowel sounds positive,abdomen soft and non-tender without masses, organomegaly or hernias noted. no renal bruits  Msk:  No deformity or scoliosis noted of thoracic or lumbar  spine.  no acute joint changes Pulses:  R and L carotid,radial,femoral,dorsalis pedis and posterior tibial pulses are full and equal bilaterally Extremities:  No clubbing, cyanosis, edema, or deformity noted with normal full range of motion of all joints.   Neurologic:  sensation intact to light touch, gait normal, and DTRs symmetrical and normal.   Skin:  Intact without suspicious lesions or rashes Cervical Nodes:  No lymphadenopathy noted Inguinal Nodes:  No significant adenopathy Psych:  affect seems overall ok - but pt seems somewhat indifferent about her current  health problems (? denial) -- and taking her medication  Diabetes Management Exam:    Foot Exam (with socks and/or shoes not present):       Sensory-Pinprick/Light touch:          Left medial foot (L-4): normal          Left dorsal foot (L-5): normal          Left lateral foot (S-1): normal          Right medial foot (L-4): normal          Right dorsal foot (L-5): normal          Right lateral foot (S-1): normal       Sensory-Monofilament:          Left foot: normal          Right foot: normal       Inspection:          Left foot: normal          Right foot: normal       Nails:          Left foot: normal          Right foot: normal   Impression & Recommendations:  Problem # 1:  HEALTH MAINTENANCE EXAM (ICD-V70.0) Assessment Comment Only reviewed health habits including diet, exercise and skin cancer prevention reviewed health maintenance list and family history   Problem # 2:  HYPERLIPIDEMIA (ICD-272.4) Assessment: Deteriorated  pt ran out of her zocor expect it to be high  inst to get back on it  rev low sat fat diet  Her updated medication list for this problem includes:    Zocor 20 Mg Tabs (Simvastatin) .Marland Kitchen... Take 1 tab by mouth at bedtime  Labs Reviewed: SGOT: 16 (12/11/2008)   SGPT: 16 (12/11/2008)   HDL:38.30 (12/11/2008), 35.70 (09/07/2008)  LDL:DEL (08/02/2007)  Chol:201 (12/11/2008), 197 (09/07/2008)  Trig:117.0 (12/11/2008), 248.0 (09/07/2008)  Problem # 3:  ADENOCARCINOMA, SIGMOID COLON (ICD-153.3) Assessment: Improved doing well and cancer free at this time   Problem # 4:  HYPOTHYROIDISM (ICD-244.9) Assessment: Deteriorated  out of thyroid med  will start back on it asap  disc need for tx tsh and imp to overall health Her updated medication list for this problem includes:    Synthroid 150 Mcg Tabs (Levothyroxine sodium) .Marland Kitchen... Take 1 tablet by mouth once a day  Labs Reviewed: TSH: 3.54 (07/08/2008)    HgBA1c: 6.6 (12/11/2008) Chol: 201  (12/11/2008)   HDL: 38.30 (12/11/2008)   LDL: DEL (08/02/2007)   TG: 117.0 (12/11/2008)  Problem # 5:  DIABETES MELLITUS, TYPE II (ICD-250.00) Assessment: Deteriorated expect AIC -out of metformin and not good diet plus wt gain  will get back on track / on med  f/u 3 mo  Her updated medication list for this problem includes:    Metformin Hcl 500 Mg Tabs (Metformin hcl) .Marland Kitchen... 1 by mouth two times a day  Problem # 6:  DEPRESSION (ICD-311) some grief but overall fairly stable with psychiatric care disc imp of getting the thyroid in check for depression control pt does seem a bit uninterested in general care -- interesting in light of stopping her meds  Her updated medication list for this problem includes:    Alprazolam 0.5 Mg Tabs (Alprazolam) .Marland Kitchen... Take 1-2 by mouth daily prn    Cymbalta 30 Mg Cpep (Duloxetine hcl) .Marland Kitchen... Take 3  tablet by mouth once a day  Complete Medication List: 1)  Synthroid 150 Mcg Tabs (Levothyroxine sodium) .... Take 1 tablet by mouth once a day 2)  Alprazolam 0.5 Mg Tabs (Alprazolam) .... Take 1-2 by mouth daily prn 3)  Cymbalta 30 Mg Cpep (Duloxetine hcl) .... Take 3  tablet by mouth once a day 4)  Diabetic Test Strips and Lancets  .... To check glucose two times a day and as needed for out of control dm 250.0 5)  Metformin Hcl 500 Mg Tabs (Metformin hcl) .Marland Kitchen.. 1 by mouth two times a day 6)  Adderall 10 Mg Tabs (Amphetamine-dextroamphetamine) .... One tablet by mouth  twice a day 7)  Reglan 5 Mg/ml Soln (Metoclopramide hcl) .... Take one tab one half hour before meals and at bedtime 8)  Glucose Monitor Accu- Check  .... To use to check glucose in pt with diabetes 250.0 as directed 9)  Zocor 20 Mg Tabs (Simvastatin) .... Take 1 tab by mouth at bedtime 10)  Advil 200 Mg Tabs (Ibuprofen) .... Otc as directed.  Other Orders: Admin 1st Vaccine (40102) Flu Vaccine 15yrs + [phone])  Patient Instructions: 1)  please call [place] office-- I'm waiting on AIC and  cholesterol profile  2)  please send for dexa and mam report from Dr Henderson Cloud  3)  the current recommendation for calcium intake is 1200-1500 mg daily with [phone] IU of vitamin D  4)  pending cholesterol and AIC I will update you  5)  flu shot today  6)  follow up with me in 3 months  Prescriptions: ZOCOR 20 MG TABS (SIMVASTATIN) Take 1 tab by mouth at bedtime  #30 x 11   Entered and Authorized by:   Judith Part MD   Signed by:   Judith Part MD on 12/08/2009   Method used:   Electronically to        [id number] (retail)       [address]       [place], [place]  [address]       Ph: [phone] or [phone]       Fax: [phone]   RxID:   [id number] METFORMIN HCL 500 MG TABS (METFORMIN HCL) 1 by mouth two times a day  #60 x 11   Entered and Authorized by:   Judith Part MD   Signed by:   Judith Part MD on 12/08/2009   Method used:   Electronically to        [id number] (retail)       [address]       [place], [place]  [address]       Ph: [phone] or [phone]       Fax: [phone]   RxID:   [id number] SYNTHROID 150 MCG TABS (LEVOTHYROXINE SODIUM) Take 1 tablet by mouth once a day  #30 x 11   Entered and Authorized by:   Judith Part MD   Signed by:   [employer]  MD on 12/08/2009   Method used:   Electronically to        [id number] (retail)       [address]       [place], [place]  [address]       Ph: [phone] or [phone]       Fax: [phone]   RxID:   [id number]    Orders Added: 1)  Admin 1st Vaccine [90471] 2)  Flu Vaccine 75yrs + [93235] 3)  Est. Patient 40-64 years [57322]    Current Allergies (reviewed today): HYDROCODONE   Flu Vaccine Consent Questions     Do you have a history of severe allergic reactions to this vaccine? no    Any prior history of allergic reactions to egg and/or gelatin? no    Do you have a sensitivity to the preservative Thimersol? no    Do you have a past  history of Guillan-Barre Syndrome? no    Do you currently have an acute febrile illness? no    Have you ever had a severe reaction to latex? no    Vaccine information given and explained to patient? yes    Are you currently pregnant? no    Lot Number:[id number]   Exp Date:08/13/2010   Site Given  Left Deltoid [id number] Lewanda Rife LPN  December 08, 2009 3:35 PM

## 2010-03-17 ENCOUNTER — Inpatient Hospital Stay (INDEPENDENT_AMBULATORY_CARE_PROVIDER_SITE_OTHER)
Admission: RE | Admit: 2010-03-17 | Discharge: 2010-03-17 | Disposition: A | Discharge status: Home / Self Care | Payer: BC Managed Care – PPO | Source: Ambulatory Visit | Attending: Emergency Medicine | Admitting: Emergency Medicine

## 2010-03-17 ENCOUNTER — Other Ambulatory Visit: Payer: Self-pay

## 2010-03-17 DIAGNOSIS — F411 Generalized anxiety disorder: Secondary | ICD-10-CM

## 2010-03-17 LAB — GLUCOSE, CAPILLARY: Glucose-Capillary: 175 mg/dL — ABNORMAL HIGH (ref 70–99)

## 2010-03-21 ENCOUNTER — Ambulatory Visit (HOSPITAL_COMMUNITY)
Admission: RE | Admit: 2010-03-21 | Discharge: 2010-03-21 | Disposition: A | Discharge status: Home / Self Care | Payer: BC Managed Care – PPO | Source: Intra-hospital | Attending: Psychiatry | Admitting: Psychiatry

## 2010-03-21 ENCOUNTER — Encounter: Payer: Self-pay | Admitting: Family Medicine

## 2010-03-21 ENCOUNTER — Ambulatory Visit (INDEPENDENT_AMBULATORY_CARE_PROVIDER_SITE_OTHER): Payer: BC Managed Care – PPO | Admitting: Family Medicine

## 2010-03-21 DIAGNOSIS — E785 Hyperlipidemia, unspecified: Secondary | ICD-10-CM

## 2010-03-21 DIAGNOSIS — E039 Hypothyroidism, unspecified: Secondary | ICD-10-CM

## 2010-03-21 DIAGNOSIS — F329 Major depressive disorder, single episode, unspecified: Secondary | ICD-10-CM | POA: Insufficient documentation

## 2010-03-21 DIAGNOSIS — F3289 Other specified depressive episodes: Secondary | ICD-10-CM | POA: Insufficient documentation

## 2010-03-21 DIAGNOSIS — E119 Type 2 diabetes mellitus without complications: Secondary | ICD-10-CM

## 2010-03-28 ENCOUNTER — Other Ambulatory Visit (HOSPITAL_COMMUNITY): Payer: BC Managed Care – PPO | Attending: Psychiatry | Admitting: Psychiatry

## 2010-03-28 DIAGNOSIS — E119 Type 2 diabetes mellitus without complications: Secondary | ICD-10-CM | POA: Insufficient documentation

## 2010-03-28 DIAGNOSIS — Z85038 Personal history of other malignant neoplasm of large intestine: Secondary | ICD-10-CM | POA: Insufficient documentation

## 2010-03-28 DIAGNOSIS — F39 Unspecified mood [affective] disorder: Secondary | ICD-10-CM

## 2010-03-28 DIAGNOSIS — E039 Hypothyroidism, unspecified: Secondary | ICD-10-CM | POA: Insufficient documentation

## 2010-03-28 DIAGNOSIS — Z818 Family history of other mental and behavioral disorders: Secondary | ICD-10-CM | POA: Insufficient documentation

## 2010-03-28 DIAGNOSIS — E7800 Pure hypercholesterolemia, unspecified: Secondary | ICD-10-CM | POA: Insufficient documentation

## 2010-03-29 ENCOUNTER — Other Ambulatory Visit (HOSPITAL_COMMUNITY): Payer: BC Managed Care – PPO | Admitting: Psychiatry

## 2010-03-30 ENCOUNTER — Other Ambulatory Visit (HOSPITAL_COMMUNITY): Payer: BC Managed Care – PPO | Admitting: Psychiatry

## 2010-03-31 ENCOUNTER — Other Ambulatory Visit (HOSPITAL_COMMUNITY): Payer: BC Managed Care – PPO | Admitting: Psychiatry

## 2010-04-01 ENCOUNTER — Other Ambulatory Visit (HOSPITAL_COMMUNITY): Payer: BC Managed Care – PPO | Admitting: Psychiatry

## 2010-04-04 ENCOUNTER — Other Ambulatory Visit (HOSPITAL_COMMUNITY): Payer: BC Managed Care – PPO | Admitting: Psychiatry

## 2010-04-05 ENCOUNTER — Encounter (INDEPENDENT_AMBULATORY_CARE_PROVIDER_SITE_OTHER): Payer: Self-pay | Admitting: *Deleted

## 2010-04-05 ENCOUNTER — Other Ambulatory Visit: Payer: Self-pay | Admitting: Family Medicine

## 2010-04-05 ENCOUNTER — Other Ambulatory Visit (HOSPITAL_COMMUNITY): Payer: BC Managed Care – PPO | Admitting: Psychiatry

## 2010-04-05 ENCOUNTER — Other Ambulatory Visit (INDEPENDENT_AMBULATORY_CARE_PROVIDER_SITE_OTHER): Payer: BC Managed Care – PPO

## 2010-04-05 DIAGNOSIS — E785 Hyperlipidemia, unspecified: Secondary | ICD-10-CM

## 2010-04-05 DIAGNOSIS — E119 Type 2 diabetes mellitus without complications: Secondary | ICD-10-CM

## 2010-04-05 DIAGNOSIS — E039 Hypothyroidism, unspecified: Secondary | ICD-10-CM

## 2010-04-05 DIAGNOSIS — R5383 Other fatigue: Secondary | ICD-10-CM

## 2010-04-05 DIAGNOSIS — R5381 Other malaise: Secondary | ICD-10-CM

## 2010-04-05 LAB — LIPID PANEL
Cholesterol: 166 mg/dL (ref 0–200)
HDL: 39.1 mg/dL (ref >39.00)
LDL Cholesterol: 87 mg/dL (ref 0–99)
VLDL: 39.8 mg/dL (ref 0.0–40.0)

## 2010-04-05 LAB — RENAL FUNCTION PANEL
Albumin: 3.8 g/dL (ref 3.5–5.2)
BUN: 18 mg/dL (ref 6–23)
Calcium: 9 mg/dL (ref 8.4–10.5)
Creatinine, Ser: 0.6 mg/dL (ref 0.4–1.2)
Glucose, Bld: 134 mg/dL — ABNORMAL HIGH (ref 70–99)
Phosphorus: 3.3 mg/dL (ref 2.3–4.6)
Potassium: 4.2 mEq/L (ref 3.5–5.1)

## 2010-04-05 LAB — HEMOGLOBIN A1C: Hgb A1c MFr Bld: 7.3 % — ABNORMAL HIGH (ref 4.6–6.5)

## 2010-04-06 ENCOUNTER — Other Ambulatory Visit (HOSPITAL_COMMUNITY): Payer: BC Managed Care – PPO | Admitting: Psychiatry

## 2010-04-06 ENCOUNTER — Telehealth (INDEPENDENT_AMBULATORY_CARE_PROVIDER_SITE_OTHER): Payer: Self-pay | Admitting: *Deleted

## 2010-04-06 LAB — VITAMIN B12: Vitamin B-12: 189 pg/mL — ABNORMAL LOW (ref 211–911)

## 2010-04-06 NOTE — Assessment & Plan Note (Signed)
Summary: 3 MONTH FOLLOW UP/[facility]  Nurse Visit   Vital Signs:  Patient profile:   50 year old female Height:      62.25 inches Weight:      209.75 pounds BMI:     38.19 Temp:     98.1 degrees F oral Pulse rate:   84 / minute Pulse rhythm:   regular BP sitting:   122 / 80  (left arm) Cuff size:   regular  Vitals Entered By: Lewanda Rife LPN (March 21, 2010 4:16 PM)  Physical Exam  General:  overweight but generally well appearing-- wt gain noted   Head:  normocephalic, atraumatic, and no abnormalities observed.   Eyes:  vision grossly intact, pupils equal, pupils round, and pupils reactive to light.  no conjunctival pallor, injection or icterus  Mouth:  pharynx pink and moist.   Neck:  supple with full rom and no masses or thyromegally, no JVD or carotid bruit  Chest Wall:  No deformities, masses, or tenderness noted. Lungs:  Normal respiratory effort, chest expands symmetrically. Lungs are clear to auscultation, no crackles or wheezes. Heart:  Normal rate and regular rhythm. S1 and S2 normal without gallop, murmur, click, rub or other extra sounds. Abdomen:  Bowel sounds positive,abdomen soft and non-tender without masses, organomegaly or hernias noted. no renal bruits  Msk:  No deformity or scoliosis noted of thoracic or lumbar spine.  no acute joint changes Pulses:  R and L carotid,radial,femoral,dorsalis pedis and posterior tibial pulses are full and equal bilaterally Extremities:  No clubbing, cyanosis, edema, or deformity noted with normal full range of motion of all joints.   Neurologic:  sensation intact to light touch, gait normal, and DTRs symmetrical and normal.   Skin:  Intact without suspicious lesions or rashes Cervical Nodes:  No lymphadenopathy noted Psych:  depressed / monotone poor eye contact  little facial exp no SI  Diabetes Management Exam:    Foot Exam (with socks and/or shoes not present):       Sensory-Pinprick/Light touch:          Left medial foot  (L-4): normal          Left dorsal foot (L-5): normal          Left lateral foot (S-1): normal          Right medial foot (L-4): normal          Right dorsal foot (L-5): normal          Right lateral foot (S-1): normal       Sensory-Monofilament:          Left foot: normal          Right foot: normal       Inspection:          Left foot: normal          Right foot: normal       Nails:          Left foot: normal          Right foot: normal  CC: three month f/u   History of Present Illness: here for f/u of DM and lipids and hypothyroidism  her depression is worse lately - her psychiatrist is going to take her out of work  a lot of changes at home  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  work is hard - a total change in management  will be going to intensive outpt plan [place]   was taking everything until last thursday    got a Cytogeneticist and a liscence   AIC last was 7.1 - which agreed with sugars in the 150s(off med) metformin --her strips ran out  her sugars had run in the 130s  for a while was doing better with diet - then depression caused her to get off the wagon   lipids were very high off zocor with LDL of 194 and trig 233 diet not great  is back on thyroid med- due for check as well  wt is down 3 lb  bp 122/80     Impression & Recommendations:  Problem # 1:  HYPERLIPIDEMIA (ICD-272.4) Assessment Deteriorated  will plan to check labs in 2 weeks after back on meds  rev low sat fat diet - though not very motivated due to depression Her updated medication list for this problem includes:    Zocor 20 Mg Tabs (Simvastatin) .Marland Kitchen... Take 1 tab by mouth at bedtime  Labs Reviewed: SGOT: 16 (12/08/2009)   SGPT: 20 (12/08/2009)   HDL:41 (12/13/2009), 38.30 (12/11/2008)  LDL:194 (12/13/2009), DEL (08/02/2007)  Chol:282 (12/13/2009), 274 (12/09/2009)  Trig:233 (12/13/2009), 117.0 (12/11/2008)  Problem # 2:  HYPOTHYROIDISM (ICD-244.9) Assessment:  Unchanged back on med  still depressed -otherwise clinically stable check tsh in 2 wk on current dose stressed imp of compliance  Her updated medication list for this problem includes:    Synthroid 150 Mcg Tabs (Levothyroxine sodium) .Marland Kitchen... Take 1 tablet by mouth once a day  Problem # 3:  DIABETES MELLITUS, TYPE II (ICD-250.00) Assessment: Deteriorated  this is not improved due to very bad diet and lack of exercise with depression labs 2 wk and make plan hope for imp in motivatio disc low glycemic diet- she knows what to do  Her updated medication list for this problem includes:    Metformin Hcl 500 Mg Tabs (Metformin hcl) .Marland Kitchen... 1 by mouth two times a day  Labs Reviewed: Creat: 0.64 (12/08/2009)     Last Eye Exam: normal (05/15/2007) Reviewed HgBA1c results: 7.1 (12/09/2009)  6.6 (12/11/2008)  Problem # 4:  DEPRESSION (ICD-311) Assessment: Deteriorated worse with continued f/u with psychiatrist  adv counseling disc stressors and coping tch in detail today tough situation - lot of loss latley- and it may be all catching up with her   Her updated medication list for this problem includes:    Alprazolam 0.5 Mg Tabs (Alprazolam) .Marland Kitchen... Take 1-2 by mouth daily as needed    Cymbalta 30 Mg Cpep (Duloxetine hcl) .Marland Kitchen... Take 4  tablets by mouth once a day  Complete Medication List: 1)  Synthroid 150 Mcg Tabs (Levothyroxine sodium) .... Take 1 tablet by mouth once a day 2)  Alprazolam 0.5 Mg Tabs (Alprazolam) .... Take 1-2 by mouth daily as needed 3)  Cymbalta 30 Mg Cpep (Duloxetine hcl) .... Take 4  tablets by mouth once a day 4)  Diabetic Test Strips and Lancets  .... To check glucose two times a day and as needed for out of control dm 250.0 5)  Metformin Hcl 500 Mg Tabs (Metformin hcl) .Marland Kitchen.. 1 by mouth two times a day 6)  Reglan 5 Mg/ml Soln (Metoclopramide hcl) .... Take one tab  at bedtime 7)  Glucose Monitor Accu- Check  .... To use to check glucose in pt with diabetes 250.0 as  directed 8)  Zocor 20 Mg Tabs (Simvastatin) .... Take 1 tab by mouth at bedtime 9)  Advil 200 Mg Tabs (Ibuprofen) .Marland KitchenMarland KitchenMarland Kitchen  Otc as directed. 10)  Adderall 15 Mg Tabs (Amphetamine-dextroamphetamine) .... Take 1 tablet by mouth once a day   Patient Instructions: 1)  schedule fasting labs for 2 weeks lipid/ast/alt/ renal/ TSh/ free /T4 and AIC for 24.9 and 272 , 250.0  2)  follow up with me in about 3 months  3)  try to aim for 20-30 minutes of exercise per day  4)  no change in medicines 5)  make big effort not to miss doses  6)  go forward with the therapy as planned     Past History:  Past Medical History: Last updated: 12/08/2009 Anxiety Depression ? bipolar diz Diabetes mellitus, type II Hypothyroidism endometriosis colon cancer - surgery  psychiatry- Dr Waverly [facility]  Past Surgical History: Last updated: 06/19/2008 GYN surgery- laparoscopy,  D & C for endometriosis (1997) Ganglion cyst (2003) D & C- miscarriage (2006) Knee surgery- dislocation (06/2004) Colonoscopy- normal (10/2004) R F- 51 years old total hysterectomy   Family History: Last updated: 07/05/2007 Father: CAD, DM, colon cancer, lung tumor Mother: breast cancer, DM, renal insuff, chol  Siblings:   Social History: Last updated: 10/01/2008 Marital Status: Married Children: none Occupation: [employer] 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  Alcohol Use - yes -socially Daily Caffeine Use  Risk Factors: Smoking Status: never (10/01/2008)   Review of Systems General:  Complains of fatigue; denies loss of appetite and malaise. Eyes:  Denies blurring and eye irritation. CV:  Denies chest pain or discomfort, palpitations, and shortness of breath with exertion. Resp:  Denies cough, shortness of breath, and wheezing. GI:  Denies indigestion and nausea. GU:  Denies urinary frequency. MS:  Denies muscle aches and cramps. Derm:  Denies lesion(s), poor wound healing, and rash. Neuro:   Denies headaches and tingling. Psych:  Complains of anxiety, depression, easily tearful, and irritability; denies sense of great danger and suicidal thoughts/plans. Endo:  Denies cold intolerance, excessive thirst, excessive urination, and heat intolerance. Heme:  Denies abnormal bruising and bleeding.   Allergies: 1)  Hydrocodone  Orders Added: 1)  Est. Patient Level IV [16109] Prescriptions: DIABETIC TEST STRIPS AND LANCETS to check glucose two times a day and as needed for out of control DM 250.0  #100 x 3   Entered and Authorized by:   Judith Part MD   Signed by:   Judith Part MD on 03/21/2010   Method used:   Print then Give to Patient   RxID:   [id number]   Current Allergies (reviewed today): HYDROCODONE

## 2010-04-07 ENCOUNTER — Other Ambulatory Visit (HOSPITAL_COMMUNITY): Payer: BC Managed Care – PPO | Admitting: Psychiatry

## 2010-04-08 ENCOUNTER — Other Ambulatory Visit (HOSPITAL_COMMUNITY): Payer: BC Managed Care – PPO | Admitting: Psychiatry

## 2010-04-11 ENCOUNTER — Other Ambulatory Visit (HOSPITAL_COMMUNITY): Payer: BC Managed Care – PPO | Admitting: Psychiatry

## 2010-04-12 ENCOUNTER — Other Ambulatory Visit (HOSPITAL_COMMUNITY): Payer: BC Managed Care – PPO | Admitting: Psychiatry

## 2010-04-12 NOTE — Progress Notes (Signed)
----   Converted from flag ---- ---- 04/05/2010 8:52 PM, Colon Flattery Tower MD wrote: yes - B12 level for dx of fatigue- thanks   ---- 04/05/2010 7:56 AM, Liane Comber CMA (AAMA) wrote: Pt came in for labs today, she mentioned that [facility] and [facility]iatrist there wanted her to have a b12 level. Is it ok to add that to these labs? Kelli Cruz ------------------------------

## 2010-04-13 ENCOUNTER — Encounter: Payer: Self-pay | Admitting: Family Medicine

## 2010-04-13 ENCOUNTER — Other Ambulatory Visit (HOSPITAL_COMMUNITY): Payer: BC Managed Care – PPO | Admitting: Psychiatry

## 2010-04-13 ENCOUNTER — Ambulatory Visit (INDEPENDENT_AMBULATORY_CARE_PROVIDER_SITE_OTHER): Payer: BC Managed Care – PPO

## 2010-04-13 DIAGNOSIS — E538 Deficiency of other specified B group vitamins: Secondary | ICD-10-CM | POA: Insufficient documentation

## 2010-04-14 ENCOUNTER — Other Ambulatory Visit (HOSPITAL_COMMUNITY): Payer: BC Managed Care – PPO | Attending: Psychiatry | Admitting: Psychiatry

## 2010-04-14 DIAGNOSIS — E7800 Pure hypercholesterolemia, unspecified: Secondary | ICD-10-CM | POA: Insufficient documentation

## 2010-04-14 DIAGNOSIS — Z818 Family history of other mental and behavioral disorders: Secondary | ICD-10-CM | POA: Insufficient documentation

## 2010-04-14 DIAGNOSIS — Z85038 Personal history of other malignant neoplasm of large intestine: Secondary | ICD-10-CM | POA: Insufficient documentation

## 2010-04-14 DIAGNOSIS — F39 Unspecified mood [affective] disorder: Secondary | ICD-10-CM | POA: Insufficient documentation

## 2010-04-14 DIAGNOSIS — E119 Type 2 diabetes mellitus without complications: Secondary | ICD-10-CM | POA: Insufficient documentation

## 2010-04-14 DIAGNOSIS — E039 Hypothyroidism, unspecified: Secondary | ICD-10-CM | POA: Insufficient documentation

## 2010-04-15 ENCOUNTER — Other Ambulatory Visit (HOSPITAL_COMMUNITY): Payer: BC Managed Care – PPO | Admitting: Psychiatry

## 2010-04-21 ENCOUNTER — Ambulatory Visit (INDEPENDENT_AMBULATORY_CARE_PROVIDER_SITE_OTHER): Payer: BC Managed Care – PPO

## 2010-04-21 ENCOUNTER — Ambulatory Visit: Payer: BC Managed Care – PPO

## 2010-04-21 ENCOUNTER — Encounter: Payer: Self-pay | Admitting: Family Medicine

## 2010-04-21 DIAGNOSIS — E538 Deficiency of other specified B group vitamins: Secondary | ICD-10-CM

## 2010-04-21 NOTE — Assessment & Plan Note (Signed)
Summary: Vitamin b12 injection  Nurse Visit   Allergies: 1)  Hydrocodone  Medication Administration  Injection # 1:    Medication: Vit B12 1000 mcg    Diagnosis: VITAMIN B12 DEFICIENCY (ICD-266.2)    Route: IM    Site: L deltoid    Exp Date: 11/14/2011    Lot #: [id number]    Mfr: American Regent    Patient tolerated injection without complications    Given by: Linde Gillis CMA Duncan Dull) (April 13, 2010 2:13 PM)  Orders Added: 1)  Vit B12 1000 mcg [J3420] 2)  Admin of Therapeutic Inj  intramuscular or subcutaneous [04540]

## 2010-04-23 ENCOUNTER — Encounter: Payer: Self-pay | Admitting: Family Medicine

## 2010-04-26 NOTE — Miscellaneous (Signed)
Summary: Cyanocobalamin 1079mcg/ml  Clinical Lists Changes  Medications: Added new medication of CYANOCOBALAMIN 1000 MCG/ML SOLN (CYANOCOBALAMIN) 1ml IM once weekly for 4 weeks then 5th week  f/u with Dr Milinda Antis.     Current Allergies: HYDROCODONE

## 2010-04-26 NOTE — Assessment & Plan Note (Signed)
Summary: B-12 INJECTION/CLE  Nurse Visit   Allergies: 1)  Hydrocodone  Medication Administration  Injection # 1:    Medication: Vit B12 1000 mcg    Diagnosis: VITAMIN B12 DEFICIENCY (ICD-266.2)    Route: IM    Site: R deltoid    Exp Date: 11/14/2011    Lot #: [id number]    Mfr: American Regent    Patient tolerated injection without complications    Given by: Lewanda Rife LPN (April 21, 3662 4:02 PM)  Orders Added: 1)  Vit B12 1000 mcg [J3420] 2)  Admin of Therapeutic Inj  intramuscular or subcutaneous [40347]

## 2010-04-29 ENCOUNTER — Ambulatory Visit (INDEPENDENT_AMBULATORY_CARE_PROVIDER_SITE_OTHER): Payer: BC Managed Care – PPO | Admitting: Family Medicine

## 2010-04-29 ENCOUNTER — Encounter: Payer: Self-pay | Admitting: Family Medicine

## 2010-04-29 DIAGNOSIS — E538 Deficiency of other specified B group vitamins: Secondary | ICD-10-CM

## 2010-05-03 NOTE — Assessment & Plan Note (Signed)
Summary: B12 SHOT / LFW  Nurse Visit   Allergies: 1)  Hydrocodone  Medication Administration  Injection # 1:    Medication: Vit B12 1000 mcg    Diagnosis: VITAMIN B12 DEFICIENCY (ICD-266.2)    Route: IM    Site: L deltoid    Exp Date: 11/14/2011    Lot #: [id number]    Mfr: American Regent    Patient tolerated injection without complications    Given by: Linde Gillis CMA Duncan Dull) (April 29, 2010 3:42 PM)  Orders Added: 1)  Vit B12 1000 mcg [J3420] 2)  Admin of Therapeutic Inj  intramuscular or subcutaneous [14782]

## 2010-05-04 LAB — COMPREHENSIVE METABOLIC PANEL
ALT: 18 U/L (ref 0–35)
AST: 19 U/L (ref 0–37)
Alkaline Phosphatase: 70 U/L (ref 39–117)
CO2: 29 mEq/L (ref 19–32)
Chloride: 102 mEq/L (ref 96–112)
GFR calc non Af Amer: >60 mL/min (ref >60)
Glucose, Bld: 154 mg/dL — ABNORMAL HIGH (ref 70–99)
Potassium: 3.8 mEq/L (ref 3.5–5.1)
Sodium: 137 mEq/L (ref 135–145)
Total Bilirubin: 0.8 mg/dL (ref 0.3–1.2)

## 2010-05-04 LAB — BASIC METABOLIC PANEL
BUN: 17 mg/dL (ref 6–23)
CO2: 29 mEq/L (ref 19–32)
Chloride: 107 mEq/L (ref 96–112)
Glucose, Bld: 126 mg/dL — ABNORMAL HIGH (ref 70–99)
Potassium: 4.5 mEq/L (ref 3.5–5.1)

## 2010-05-04 LAB — GLUCOSE, CAPILLARY
Glucose-Capillary: 114 mg/dL — ABNORMAL HIGH (ref 70–99)
Glucose-Capillary: 120 mg/dL — ABNORMAL HIGH (ref 70–99)
Glucose-Capillary: 123 mg/dL — ABNORMAL HIGH (ref 70–99)
Glucose-Capillary: 123 mg/dL — ABNORMAL HIGH (ref 70–99)
Glucose-Capillary: 152 mg/dL — ABNORMAL HIGH (ref 70–99)
Glucose-Capillary: 167 mg/dL — ABNORMAL HIGH (ref 70–99)

## 2010-05-04 LAB — DIFFERENTIAL
Eosinophils Absolute: 0.2 10*3/uL (ref 0.0–0.7)
Eosinophils Relative: 3 % (ref 0–5)
Lymphs Abs: 2.1 10*3/uL (ref 0.7–4.0)

## 2010-05-04 LAB — CBC
HCT: 38.9 % (ref 36.0–46.0)
Hemoglobin: 11 g/dL — ABNORMAL LOW (ref 12.0–15.0)
MCHC: 33.7 g/dL (ref 30.0–36.0)
MCHC: 34.1 g/dL (ref 30.0–36.0)
MCV: 87.7 fL (ref 78.0–100.0)
Platelets: 255 10*3/uL (ref 150–400)
RBC: 3.67 MIL/uL — ABNORMAL LOW (ref 3.87–5.11)
RDW: 15.1 % (ref 11.5–15.5)
WBC: 8 10*3/uL (ref 4.0–10.5)

## 2010-05-06 ENCOUNTER — Ambulatory Visit: Payer: BC Managed Care – PPO

## 2010-05-10 ENCOUNTER — Ambulatory Visit (INDEPENDENT_AMBULATORY_CARE_PROVIDER_SITE_OTHER): Payer: BC Managed Care – PPO | Admitting: Family Medicine

## 2010-05-10 DIAGNOSIS — E538 Deficiency of other specified B group vitamins: Secondary | ICD-10-CM

## 2010-05-10 MED ORDER — CYANOCOBALAMIN 1000 MCG/ML IJ SOLN
1000.0000 ug | Freq: Once | INTRAMUSCULAR | Status: AC
  Administered 2010-05-10: 1000 ug via INTRAMUSCULAR

## 2010-05-12 NOTE — Progress Notes (Signed)
  Subjective:    Patient ID: Kelli Cruz, female    DOB: Mar 12, 1960, 50 y.o.   MRN: [id number]  HPI    Review of Systems     Objective:   Physical Exam        Assessment & Plan:

## 2010-05-17 LAB — GLUCOSE, CAPILLARY: Glucose-Capillary: 97 mg/dL (ref 70–99)

## 2010-05-22 LAB — GLUCOSE, CAPILLARY
Glucose-Capillary: 102 mg/dL — ABNORMAL HIGH (ref 70–99)
Glucose-Capillary: 102 mg/dL — ABNORMAL HIGH (ref 70–99)
Glucose-Capillary: 104 mg/dL — ABNORMAL HIGH (ref 70–99)
Glucose-Capillary: 108 mg/dL — ABNORMAL HIGH (ref 70–99)
Glucose-Capillary: 110 mg/dL — ABNORMAL HIGH (ref 70–99)
Glucose-Capillary: 111 mg/dL — ABNORMAL HIGH (ref 70–99)
Glucose-Capillary: 113 mg/dL — ABNORMAL HIGH (ref 70–99)
Glucose-Capillary: 113 mg/dL — ABNORMAL HIGH (ref 70–99)
Glucose-Capillary: 114 mg/dL — ABNORMAL HIGH (ref 70–99)
Glucose-Capillary: 119 mg/dL — ABNORMAL HIGH (ref 70–99)
Glucose-Capillary: 121 mg/dL — ABNORMAL HIGH (ref 70–99)
Glucose-Capillary: 123 mg/dL — ABNORMAL HIGH (ref 70–99)
Glucose-Capillary: 133 mg/dL — ABNORMAL HIGH (ref 70–99)
Glucose-Capillary: 134 mg/dL — ABNORMAL HIGH (ref 70–99)
Glucose-Capillary: 149 mg/dL — ABNORMAL HIGH (ref 70–99)
Glucose-Capillary: 181 mg/dL — ABNORMAL HIGH (ref 70–99)
Glucose-Capillary: 88 mg/dL (ref 70–99)
Glucose-Capillary: 93 mg/dL (ref 70–99)
Glucose-Capillary: 97 mg/dL (ref 70–99)

## 2010-05-22 LAB — POCT I-STAT, CHEM 8
BUN: 14 mg/dL (ref 6–23)
Chloride: 106 mEq/L (ref 96–112)
Creatinine, Ser: 0.6 mg/dL (ref 0.4–1.2)
Potassium: 4.2 mEq/L (ref 3.5–5.1)
Sodium: 139 mEq/L (ref 135–145)
TCO2: 23 mmol/L (ref 0–100)

## 2010-05-22 LAB — CBC
HCT: 33.7 % — ABNORMAL LOW (ref 36.0–46.0)
Hemoglobin: 13.3 g/dL (ref 12.0–15.0)
Hemoglobin: 10.5 g/dL — ABNORMAL LOW (ref 12.0–15.0)
MCHC: 33.7 g/dL (ref 30.0–36.0)
MCHC: 33.9 g/dL (ref 30.0–36.0)
MCV: 88.3 fL (ref 78.0–100.0)
MCV: 88.2 fL (ref 78.0–100.0)
MCV: 88.7 fL (ref 78.0–100.0)
Platelets: 257 10*3/uL (ref 150–400)
Platelets: 275 10*3/uL (ref 150–400)
RBC: 4.47 MIL/uL (ref 3.87–5.11)
RBC: 3.54 MIL/uL — ABNORMAL LOW (ref 3.87–5.11)
RBC: 4.32 MIL/uL (ref 3.87–5.11)
RDW: 14.1 % (ref 11.5–15.5)
RDW: 14 % (ref 11.5–15.5)
RDW: 14.2 % (ref 11.5–15.5)
WBC: 5.1 10*3/uL (ref 4.0–10.5)

## 2010-05-22 LAB — COMPREHENSIVE METABOLIC PANEL
ALT: 29 U/L (ref 0–35)
AST: 22 U/L (ref 0–37)
AST: 39 U/L — ABNORMAL HIGH (ref 0–37)
Alkaline Phosphatase: 55 U/L (ref 39–117)
BUN: 12 mg/dL (ref 6–23)
CO2: 24 mEq/L (ref 19–32)
CO2: 29 mEq/L (ref 19–32)
CO2: 29 mEq/L (ref 19–32)
Calcium: 9.5 mg/dL (ref 8.4–10.5)
Calcium: 8.8 mg/dL (ref 8.4–10.5)
Calcium: 9.4 mg/dL (ref 8.4–10.5)
Chloride: 102 mEq/L (ref 96–112)
Creatinine, Ser: 0.78 mg/dL (ref 0.4–1.2)
Creatinine, Ser: 0.72 mg/dL (ref 0.4–1.2)
GFR calc Af Amer: >60 mL/min (ref >60)
GFR calc Af Amer: >60 mL/min (ref >60)
GFR calc non Af Amer: >60 mL/min (ref >60)
GFR calc non Af Amer: >60 mL/min (ref >60)
GFR calc non Af Amer: >60 mL/min (ref >60)
Glucose, Bld: 152 mg/dL — ABNORMAL HIGH (ref 70–99)
Glucose, Bld: 102 mg/dL — ABNORMAL HIGH (ref 70–99)
Potassium: 4.1 mEq/L (ref 3.5–5.1)
Sodium: 139 mEq/L (ref 135–145)
Sodium: 139 mEq/L (ref 135–145)
Total Protein: 6.9 g/dL (ref 6.0–8.3)

## 2010-05-22 LAB — URINALYSIS, ROUTINE W REFLEX MICROSCOPIC
Glucose, UA: NEGATIVE mg/dL
Glucose, UA: NEGATIVE mg/dL
Hgb urine dipstick: NEGATIVE
Ketones, ur: NEGATIVE mg/dL
Nitrite: NEGATIVE
Protein, ur: 30 mg/dL — AB
Specific Gravity, Urine: 1.002 — ABNORMAL LOW (ref 1.005–1.030)
Urobilinogen, UA: 0.2 mg/dL (ref 0.0–1.0)
pH: 8.5 — ABNORMAL HIGH (ref 5.0–8.0)

## 2010-05-22 LAB — TYPE AND SCREEN: ABO/RH(D): O NEG

## 2010-05-22 LAB — DIFFERENTIAL
Eosinophils Absolute: 0.3 10*3/uL (ref 0.0–0.7)
Eosinophils Relative: 3 % (ref 0–5)
Lymphocytes Relative: 23 % (ref 12–46)
Lymphocytes Relative: 33 % (ref 12–46)
Lymphs Abs: 2 10*3/uL (ref 0.7–4.0)
Lymphs Abs: 2.5 10*3/uL (ref 0.7–4.0)
Monocytes Relative: 4 % (ref 3–12)
Neutro Abs: 6.2 10*3/uL (ref 1.7–7.7)
Neutrophils Relative %: 70 % (ref 43–77)

## 2010-05-22 LAB — URINE MICROSCOPIC-ADD ON

## 2010-05-22 LAB — BASIC METABOLIC PANEL
BUN: 8 mg/dL (ref 6–23)
CO2: 28 mEq/L (ref 19–32)
Chloride: 104 mEq/L (ref 96–112)
Glucose, Bld: 130 mg/dL — ABNORMAL HIGH (ref 70–99)
Potassium: 4.4 mEq/L (ref 3.5–5.1)

## 2010-05-22 LAB — URINE CULTURE: Culture: NO GROWTH

## 2010-05-22 LAB — ABO/RH: ABO/RH(D): O NEG

## 2010-05-22 LAB — LACTIC ACID, PLASMA: Lactic Acid, Venous: 3.2 mmol/L — ABNORMAL HIGH (ref 0.5–2.2)

## 2010-05-23 LAB — GLUCOSE, CAPILLARY
Glucose-Capillary: 122 mg/dL — ABNORMAL HIGH (ref 70–99)
Glucose-Capillary: 95 mg/dL (ref 70–99)

## 2010-05-27 ENCOUNTER — Emergency Department (HOSPITAL_COMMUNITY)
Admission: EM | Admit: 2010-05-27 | Discharge: 2010-05-27 | Disposition: A | Discharge status: Home / Self Care | Payer: BC Managed Care – PPO | Attending: Emergency Medicine | Admitting: Emergency Medicine

## 2010-05-27 ENCOUNTER — Emergency Department (HOSPITAL_COMMUNITY): Payer: BC Managed Care – PPO

## 2010-05-27 DIAGNOSIS — F411 Generalized anxiety disorder: Secondary | ICD-10-CM | POA: Insufficient documentation

## 2010-05-27 DIAGNOSIS — R4701 Aphasia: Secondary | ICD-10-CM | POA: Insufficient documentation

## 2010-05-27 DIAGNOSIS — E119 Type 2 diabetes mellitus without complications: Secondary | ICD-10-CM | POA: Insufficient documentation

## 2010-05-27 DIAGNOSIS — E039 Hypothyroidism, unspecified: Secondary | ICD-10-CM | POA: Insufficient documentation

## 2010-05-27 DIAGNOSIS — R471 Dysarthria and anarthria: Secondary | ICD-10-CM | POA: Insufficient documentation

## 2010-05-27 DIAGNOSIS — R197 Diarrhea, unspecified: Secondary | ICD-10-CM | POA: Insufficient documentation

## 2010-05-27 DIAGNOSIS — H547 Unspecified visual loss: Secondary | ICD-10-CM | POA: Insufficient documentation

## 2010-05-27 DIAGNOSIS — F319 Bipolar disorder, unspecified: Secondary | ICD-10-CM | POA: Insufficient documentation

## 2010-05-27 DIAGNOSIS — R51 Headache: Secondary | ICD-10-CM | POA: Insufficient documentation

## 2010-05-27 DIAGNOSIS — F29 Unspecified psychosis not due to a substance or known physiological condition: Secondary | ICD-10-CM | POA: Insufficient documentation

## 2010-05-27 LAB — BASIC METABOLIC PANEL
BUN: 12 mg/dL (ref 6–23)
CO2: 24 mEq/L (ref 19–32)
Calcium: 8.8 mg/dL (ref 8.4–10.5)
Creatinine, Ser: 0.66 mg/dL (ref 0.4–1.2)
GFR calc Af Amer: >60 mL/min (ref >60)

## 2010-05-27 LAB — DIFFERENTIAL
Basophils Absolute: 0 10*3/uL (ref 0.0–0.1)
Eosinophils Relative: 2 % (ref 0–5)
Lymphocytes Relative: 35 % (ref 12–46)
Lymphs Abs: 2.3 10*3/uL (ref 0.7–4.0)
Neutro Abs: 3.8 10*3/uL (ref 1.7–7.7)

## 2010-05-27 LAB — CBC
Hemoglobin: 12.6 g/dL (ref 12.0–15.0)
MCH: 29.2 pg (ref 26.0–34.0)
MCHC: 33.1 g/dL (ref 30.0–36.0)

## 2010-05-31 ENCOUNTER — Ambulatory Visit (INDEPENDENT_AMBULATORY_CARE_PROVIDER_SITE_OTHER): Payer: BC Managed Care – PPO | Admitting: Family Medicine

## 2010-05-31 ENCOUNTER — Encounter: Payer: Self-pay | Admitting: Family Medicine

## 2010-05-31 DIAGNOSIS — R4789 Other speech disturbances: Secondary | ICD-10-CM

## 2010-05-31 DIAGNOSIS — F329 Major depressive disorder, single episode, unspecified: Secondary | ICD-10-CM

## 2010-05-31 DIAGNOSIS — E119 Type 2 diabetes mellitus without complications: Secondary | ICD-10-CM

## 2010-05-31 DIAGNOSIS — R479 Unspecified speech disturbances: Secondary | ICD-10-CM

## 2010-05-31 DIAGNOSIS — Z87898 Personal history of other specified conditions: Secondary | ICD-10-CM

## 2010-05-31 DIAGNOSIS — F411 Generalized anxiety disorder: Secondary | ICD-10-CM

## 2010-05-31 DIAGNOSIS — H538 Other visual disturbances: Secondary | ICD-10-CM

## 2010-05-31 NOTE — Progress Notes (Signed)
Subjective:    Patient ID: Kelli Cruz, female    DOB: [birth year], 50 y.o.   MRN: [id number]  HPI Here for ER follow up   Diarrhea Thursday and Friday- has had since her surgery Went home from work  [employer] to [employer] for vision change-- all the sudden R eye got strange -- almost like double vision with words  Did CT scan and blood work (nl) and eye specialist  opthy exam was normal  Describes R eye trouble when reading - letters are blurred-- this is still happening intermittently      Also some concerns about speech ? Repeating words or letters --last week would get beginning of words wrong -- and had to stop and think about what to say  Did disc in detail about this with her psychiatrist  In feb inc cymbalta from 90 to 120   Much stress -had to go back to work full time -- a lot of pressure and is exhausted and depression is worsening  This is distressing to her - problems with her boss -- is stern with her  She is looking for another job    Note pt is on reglan (? Extrapyramidal eff)--has not been taking it now When she lies down at night -- head will occ jerk  Wt is up 5 lb  Good bp at 118/80  Does have migraine hx --no major headaches lately or aura  Past Medical History  Diagnosis Date  . Anxiety   . Depression   . Diabetes mellitus     type II  . Hypothyroidism   . Endometriosis   . Cancer     colon  . Ganglion cyst   . H/O: knee surgery 06/2004    dislocatoin   Past Surgical History  Procedure Date  . Laparoscopy 1997    D & C for endometriosis  . Dilation and curettage of uterus 2006    miscarriage     reports that she has never smoked. She does not have any smokeless tobacco history on file. She reports that she drinks alcohol. Her drug history not on file. family history includes Cancer in her father and mother; Coronary artery disease in her father and mother; Diabetes in her father and mother; Hyperlipidemia in her mother; and Kidney disease in her  mother. Allergies  Allergen Reactions  . Hydrocodone     REACTION: nausea       Review of Systems  Constitutional: Positive for fatigue. Negative for fever and appetite change.  HENT: Negative for hearing loss, ear pain, facial swelling, neck pain and sinus pressure.   Eyes: Positive for visual disturbance. Negative for photophobia, pain, redness and itching.  Respiratory: Negative for shortness of breath and wheezing.   Cardiovascular: Negative.   Gastrointestinal: Negative for abdominal pain.  Genitourinary: Negative for urgency and frequency.  Musculoskeletal: Negative for myalgias.  Skin: Negative.   Neurological: Positive for speech difficulty. Negative for tremors, facial asymmetry and numbness.  Hematological: Negative for adenopathy. Does not bruise/bleed easily.  Psychiatric/Behavioral: Positive for dysphoric mood. Negative for suicidal ideas and self-injury. The patient is nervous/anxious.        Objective:   Physical Exam  Constitutional: She appears well-developed and well-nourished. She appears listless.       overwt and well appearing   HENT:  Head: Normocephalic and atraumatic.  Right Ear: External ear normal.  Left Ear: External ear normal.  Nose: Nose normal.  Mouth/Throat: Oropharynx is clear and moist.  Eyes:  Conjunctivae and EOM are normal. Pupils are equal, round, and reactive to light. Right eye exhibits no discharge. Left eye exhibits no discharge. No scleral icterus.  Neck: Normal range of motion. Neck supple. No JVD present. No thyromegaly present.  Cardiovascular: Normal rate, regular rhythm and normal heart sounds.   Pulmonary/Chest: Effort normal and breath sounds normal.  Abdominal: Soft. Bowel sounds are normal. She exhibits no mass. There is no tenderness.  Musculoskeletal: Normal range of motion. She exhibits no edema and no tenderness.  Lymphadenopathy:    She has no cervical adenopathy.  Neurological: She has normal strength and normal  reflexes. She appears listless. She is not disoriented. No cranial nerve deficit or sensory deficit. She displays a negative Romberg sign. Coordination normal.  Skin: Skin is warm and dry. No rash noted. No erythema. No pallor.  Psychiatric:       Some general psychomotor slowness- listless and tired Nl eye contact           Assessment & Plan:

## 2010-05-31 NOTE — Assessment & Plan Note (Signed)
This is intermettent with some R eye double vision -- and neg opthy w/u  Disc poss of atypical migraine? Early MS or stress rxn Esp in light of speech problems- ref to neurology  Disc stress reduction- return to her psychiatrist Also d/c her reglan due to the potential of extrapyramidal side eff

## 2010-05-31 NOTE — Patient Instructions (Signed)
We will do neurology referral at check out  Keep working on healthy low sugar diet  Consider job change  Continue psychiatric follow up and counseling  Discontinue reglan

## 2010-06-02 NOTE — Assessment & Plan Note (Signed)
Lab Results  Component Value Date   HGBA1C 7.3* 04/05/2010   disc diet - pt is stressed and not doing as well  Made plan for follow up for this

## 2010-06-02 NOTE — Assessment & Plan Note (Signed)
I think this is worse Enc further f/u with psychiatrist and counselor  Also anx worse surrounding work  ? If responsible for some symptoms

## 2010-06-02 NOTE — Assessment & Plan Note (Signed)
See assessment for depression  ? If resp for some physical symptoms

## 2010-06-02 NOTE — Assessment & Plan Note (Signed)
Occuring at same time as the vision change  Nl neuro exam today ? If rel to anx or other  ? If MS is in diff Ref to neurology

## 2010-06-20 ENCOUNTER — Encounter: Payer: Self-pay | Admitting: Family Medicine

## 2010-06-20 ENCOUNTER — Ambulatory Visit (INDEPENDENT_AMBULATORY_CARE_PROVIDER_SITE_OTHER): Payer: BC Managed Care – PPO | Admitting: Family Medicine

## 2010-06-20 DIAGNOSIS — F3289 Other specified depressive episodes: Secondary | ICD-10-CM

## 2010-06-20 DIAGNOSIS — E538 Deficiency of other specified B group vitamins: Secondary | ICD-10-CM

## 2010-06-20 DIAGNOSIS — E039 Hypothyroidism, unspecified: Secondary | ICD-10-CM

## 2010-06-20 DIAGNOSIS — H538 Other visual disturbances: Secondary | ICD-10-CM

## 2010-06-20 DIAGNOSIS — F329 Major depressive disorder, single episode, unspecified: Secondary | ICD-10-CM

## 2010-06-20 DIAGNOSIS — E119 Type 2 diabetes mellitus without complications: Secondary | ICD-10-CM

## 2010-06-20 NOTE — Progress Notes (Signed)
Subjective:    Patient ID: Kelli Cruz, female    DOB: 1961-01-16, 50 y.o.   MRN: [id number]  HPI Here for follow up of hypothyroidism and DM and B12 def   Wt is up 2 lb bmi is 38  Last lipids better in feb on med  Did dec thyroid dose after low tsh- due for that  Has been taking med - no missed doses  Still gets really tired -- no different than before her med change  No hair or skin change   Mood- thought her depression was getting  Better - and then regressing again -- worse at work Is looking for different work environment  Is working with her psychiatrist -may need to change her med   Has had B12 inj times 4- pend result  Does not feel any different with these   Last a1c was 7.3 Did inc metformin Still running 150-160 fasting in am  occ after a meal - was 189 after a meal at last check(not as often) --as high as 225  Going up on metformin helped for a while  Eating is not great -- but better than last time Is cutting back on fried foods  Thinks anxiety makes it worse  Some diarrhea at times - worse the last few weeks  Is trying to avoid sweets and sugar  Better with exercise - yardwork and mowing -- and may go to exercise class 2 times per week that she found free  Went to neurologist -- is pending mri report  ? Of bone cyst -- will be following up soon  Memory and vision are still not right   Past Medical History  Diagnosis Date  . Anxiety   . Depression   . Diabetes mellitus     type II  . Hypothyroidism   . Endometriosis   . Cancer     colon  . Ganglion cyst   . H/O: knee surgery 06/2004    dislocatoin    History   Social History  . Marital Status: Married    Spouse Name: N/A    Number of Children: N/A  . Years of Education: N/A   Occupational History  .  [employer]  . helps care for her mother     Social History Main Topics  . Smoking status: Never Smoker   . Smokeless tobacco: Not on file  . Alcohol Use: Yes     socially   .  Drug Use: Not on file  . Sexually Active: Not on file   Other Topics Concern  . Not on file   Social History Narrative   Daily caffeine use    Family History  Problem Relation Age of Onset  . Cancer Mother     breast   . Diabetes Mother   . Coronary artery disease Mother   . Kidney disease Mother     renal insufficiency  . Hyperlipidemia Mother   . Diabetes Father   . Coronary artery disease Father   . Cancer Father     colon       Review of Systems Review of Systems  Constitutional: Negative for fever, appetite change,  and unexpected weight change. pos for fatigue  Eyes: Negative for pain , pos for vision problem Respiratory: Negative for cough and shortness of breath.   Cardiovascular: Negative for cp or edema or sob   Gastrointestinal: Negative for nausea, diarrhea and constipation.  Genitourinary: Negative for urgency and frequency.  Skin: Negative  for pallor.  Neurological: Negative for weakness, light-headedness, numbness and headaches. , pos for dec in concentration and word finding Hematological: Negative for adenopathy. Does not bruise/bleed easily.  Psychiatric/Behavioral: pos for dysphoric mood and anx        Objective:   Physical Exam  Constitutional: She appears well-developed and well-nourished. No distress.       overwt and well appearing   HENT:  Head: Normocephalic and atraumatic.  Eyes: Conjunctivae and EOM are normal. Pupils are equal, round, and reactive to light.  Neck: Normal range of motion. Neck supple. No JVD present. Carotid bruit is not present. No thyromegaly present.  Cardiovascular: Normal rate, regular rhythm and normal heart sounds.   Pulmonary/Chest: Effort normal and breath sounds normal. No respiratory distress. She has no wheezes.  Abdominal: Soft. Bowel sounds are normal. She exhibits no distension and no mass. There is no tenderness.  Musculoskeletal: She exhibits no edema and no tenderness.  Lymphadenopathy:    She has no  cervical adenopathy.  Neurological: She is alert. No cranial nerve deficit. Coordination normal.  Skin: Skin is warm and dry. No erythema. No pallor.  Psychiatric:       Very blunted affect today Little facial expression Eye contact is fair -not good           Assessment & Plan:

## 2010-06-20 NOTE — Patient Instructions (Signed)
Labs today for thyroid and diabetes and B12  Will update with results  May need to add diabetes med  Work hard on low sugar/ low fat diet and exercise  We will plan follow up depending on how labs look

## 2010-06-20 NOTE — Assessment & Plan Note (Signed)
For f/u with neurology Per pt ? Orbital bone cyst Plan will be made

## 2010-06-20 NOTE — Assessment & Plan Note (Signed)
No clinical changes after last dose decrease  tsh today and advise

## 2010-06-20 NOTE — Assessment & Plan Note (Signed)
Check level after 4 b 12 shots in a month and adv as to further suppl Per pt this has not changed memory or energy level

## 2010-06-20 NOTE — Assessment & Plan Note (Signed)
Per pt report imp but not optimal with inc in metformin a1c today and may need to add glipizide (disc this) Rev DM diet and need for exercise and wt loss Depression and stress have prohibited this to an extent

## 2010-06-20 NOTE — Assessment & Plan Note (Signed)
With situational stress of bad work environment  Will continue to work with psychiatrist and look for new job

## 2010-06-21 LAB — HEMOGLOBIN A1C: Hgb A1c MFr Bld: 7.6 % — ABNORMAL HIGH (ref 4.6–6.5)

## 2010-06-23 ENCOUNTER — Telehealth: Payer: Self-pay | Admitting: Family Medicine

## 2010-06-23 MED ORDER — GLIPIZIDE ER 5 MG PO TB24: 5.0000 mg | ORAL_TABLET | Freq: Every day | ORAL | Status: DC

## 2010-06-23 NOTE — Telephone Encounter (Signed)
Vit B12 is improved but still on low side of normal I want her to continue getting monthly B12 shots - please schedule next one and add that to med list Also sugar control is worse - as expected - do best with diet you can Will add glucotrol xl 5 mg--Px written for call in   Update me if any low sugars  Check sugar bid  Schedule lab for aic 250.0 and then f/u in 3 months - bring sugar log

## 2010-06-24 ENCOUNTER — Other Ambulatory Visit: Payer: Self-pay | Admitting: *Deleted

## 2010-06-24 MED ORDER — METFORMIN HCL 1000 MG PO TABS: ORAL_TABLET | ORAL | Status: DC

## 2010-06-24 MED ORDER — LEVOTHYROXINE SODIUM 125 MCG PO TABS: 125.0000 ug | ORAL_TABLET | Freq: Every day | ORAL | Status: DC

## 2010-06-24 NOTE — Telephone Encounter (Signed)
Patient notified as instructed by telephone. Medication phoned to [facility] [address] [facility] as instructed. Offered to make appts for pt but she said she would call next week to schedule.

## 2010-06-28 NOTE — H&P (Signed)
Kelli Cruz, Kelli Cruz                ACCOUNT NO.:  [id number]   MEDICAL RECORD NO.:  [id number]          PATIENT TYPE:  INP   LOCATION:  NA                            FACILITY:  [facility]   PHYSICIAN:  Guy Sandifer. Henderson Cloud, M.D. DATE OF BIRTH:  05/19/1960   DATE OF ADMISSION:  12/12/2006  DATE OF DISCHARGE:                              HISTORY & PHYSICAL   CHIEF COMPLAINT:  Severe endometriosis.   HISTORY OF PRESENT ILLNESS:  This patient is a 50 year old married white  female, G1, P0, status post laparoscopy in April of this year which  revealed severe endometriosis, and a tubal ligation was also done.  She  continues to have significant pelvic pain.  After discussing the options  she is being admitted for total abdominal hysterectomy with bilateral  salpingo-oophorectomy.  She will have ureteral stents placed  preoperatively by the urologist.  Potential risks and complications as  well as issues of the menopause have been discussed preoperatively.   PAST MEDICAL HISTORY:  1. Bipolar disorder.  2. Hypothyroidism.  3. Severe endometriosis.   PAST SURGICAL HISTORY:  1. Laparoscopy with tubal ligation, April 2008.  2. Laparoscopy, 1997.  3. Ganglion cyst removed from thumb.  4. D&C for miscarriage.   OBSTETRICAL HISTORY:  Miscarriage x1.   MEDICATIONS:  Synthroid, Wellbutrin, Depakote, lorazepam.   ALLERGIES:  Question of allergy to PTU leading to hives.   SOCIAL HISTORY:  Consumes alcohol on an occasional basis.  Denies  tobacco or drug abuse.   FAMILY HISTORY:  Coronary artery disease in mother and father, diabetes  in mother, father and brother, paranoid schizophrenia in brother, and  breast cancer in mother.   REVIEW OF SYSTEMS:  NEUROLOGIC:  Denies headache.  CARDIAC:  No chest  pain.  PULMONARY:  Denies shortness of breath.  GI:  Denies recent  changes in bowel habits.   PHYSICAL EXAM:  Height 5 feet 2 inches, weight 220 pounds.  Blood  pressure 112/78.  HEENT:  Without  thyromegaly.  Lungs:  Clear to auscultation.  HEART:  Regular rate and rhythm.  BACK:  Without CVA tenderness.  BREASTS:  Fibrocystic change, without dominant mass, retraction or  discharge.  She is status post benign breast biopsy earlier this year.  ABDOMEN:  Soft, nontender, without masses.  PELVIC:  Vulva, vagina, cervix without lesion.  Uterus is normal size,  nontender.  Adnexa nontender without masses.  EXTREMITIES:  Grossly within normal limits.  NEUROLOGIC:  Grossly within normal limits.   ASSESSMENT:  Severe endometriosis.   PLAN:  Total abdominal hysterectomy with bilateral salpingo-  oophorectomy.      Guy Sandifer Henderson Cloud, M.D.  Electronically Signed     JET/MEDQ  D:  12/04/2006  T:  12/05/2006  Job:  [id number]

## 2010-06-28 NOTE — Discharge Summary (Signed)
NAMEALUNA, Kelli Cruz                ACCOUNT NO.:  [id number]   MEDICAL RECORD NO.:  [id number]          PATIENT TYPE:  INP   LOCATION:  1525                         [facility]ACILITY[facility]  [facility]   PHYSICIAN:  Wilmon Arms. Corliss Skains, M.D. DATE OF BIRTH:  03-12-1960   DATE OF ADMISSION:  08/18/2008  DATE OF DISCHARGE:  08/26/2008                               DISCHARGE SUMMARY   ADMISSION DIAGNOSIS:  Rectal cancer.   DISCHARGE DIAGNOSIS:  Rectal cancer.   PROCEDURE:  Low anterior resection.   BRIEF HISTORY:  This is a 50 year old female who has a family history of  colon cancer.  She recently presented with left lower quadrant abdominal  pain and gross blood per rectum.  Colonoscopy showed a circumferential  mass at about 12 cm.  Biopsy confirmed adenocarcinoma.  She presents now  for resection.  CT scan showed possible lymph node involvement.  Her  preoperative CEA level was normal.   HOSPITAL COURSE:  The patient was brought to the operating room on August 18, 2008 where she underwent a low anterior resection.  She had an end-to-  end stapled anastomosis at about 5 cm.  She was on the Entereg protocol.  Postoperatively, the patient had a prolonged colonic ileus.  This may  have been due to the extensive adhesions that were in the pelvis.  She  began  passing some flatus on postop day number 6.  Her diet was slowly  advanced and she began having bowel movements on postop day 7.  She is  being discharged home today.  Her wound looks good.  Percocet is  managing her pain.  She was given a prescription for p.r.n. Percocet.  Her pathology showed T2N0 lesion with 21 negative lymph nodes.   DISCHARGE INSTRUCTIONS:  1. The patient is to come to my office next week on Tuesday, September 01, 2008 for staple removal.  She is to arrive at 9 a.m.  2. She is given Percocet p.r.n. for pain.  3. She may resume a regular diet.  4. She should avoid constipation.  5. Nothing per rectum.  6. No heavy  lifting.      Wilmon Arms. Tsuei, M.D.  Electronically Signed     MKT/MEDQ  D:  08/26/2008  T:  08/26/2008  Job:  [id number]   cc:   Barbette Hair. Arlyce Dice, MD,FACG  [address]  [place]  [place] [address]

## 2010-06-28 NOTE — Op Note (Signed)
Kelli Cruz, Kelli Cruz                ACCOUNT NO.:  [id number]   MEDICAL RECORD NO.:  [id number]          PATIENT TYPE:  INP   LOCATION:  9302                          FACILITY:  [facility]   PHYSICIAN:  Lindaann Slough, M.D.  DATE OF BIRTH:  Sep 16, 1960   DATE OF PROCEDURE:  12/12/2006  DATE OF DISCHARGE:  11/07/2006                               OPERATIVE REPORT   PREOPERATIVE DIAGNOSIS:  Endometriosis.   POSTOPERATIVE DIAGNOSIS:  Endometriosis.   PROCEDURE:  Cystoscopy and insertion of bilateral ureteral catheters.   SURGEON:  Dr. Brunilda Payor.   ANESTHESIA:  General.   INDICATIONS:  The patient is 50 years old female who is scheduled for  total abdominal hysterectomy and salpingo-oophorectomy.  At Dr.  Huel Coventry request, I will put bilateral ureteral catheters for  identification of the ureters during the procedure.   Under general anesthesia the patient was prepped and draped and placed  in the dorsal lithotomy position.  A panendoscope was inserted in the  bladder.  The bladder mucosa is normal.  There is no stone or tumor in  the bladder.  The ureteral orifices  are in normal position and shape  with clear efflux.  A guide wire was then passed through a #6-French  open-ended ureteral catheter and the Glidewire was passed through the  cystoscope into the right ureteral orifice and the Glidewire was passed  in the ureter and the open-ended catheter was advanced over the  Glidewire up into the renal pelvis.  The same procedure was done on the  left side.  Then the cystoscope and Glidewire were removed.  A #16-  [place] Foley catheter was then placed in the bladder.  The ureteral  catheters were secured to the Foley catheter with pink tape.  The Foley  and ureteral catheters were then left to straight drainage.  The patient  was then placed in supine position for salpingo-oophorectomy by Dr.  Henderson Cloud.      Lindaann Slough, M.D.  Electronically Signed     MN/MEDQ  D:  12/12/2006  T:   12/12/2006  Job:  [id number]

## 2010-06-28 NOTE — Op Note (Signed)
NAMEJENIN, Kelli Cruz                ACCOUNT NO.:  [id number]   MEDICAL RECORD NO.:  [id number]          PATIENT TYPE:  INP   LOCATION:  9302                          [facility]ACILITY[facility]  [facility]   PHYSICIAN:  Guy Sandifer. Henderson Cloud, M.D. DATE OF BIRTH:  01-05-1961   DATE OF PROCEDURE:  12/12/2006  DATE OF DISCHARGE:  11/07/2006                               OPERATIVE REPORT   PREOPERATIVE DIAGNOSIS:  Severe endometriosis.   POSTOPERATIVE DIAGNOSIS:  Severe endometriosis.   PROCEDURE:  Total abdominal hysterectomy with bilateral salpingo-  oophorectomy and extensive lysis of adhesions.   SURGEON:  Harold Hedge, MD   ASSISTANT:  Richarda Overlie, MD   ANESTHESIA:  General with endotracheal intubation.   SPECIMENS:  Uterus, bilateral tubes and ovaries to pathology.   ESTIMATED BLOOD LOSS:  300 mL.   INDICATIONS AND CONSENT:  This patient is a 50 year old married white  female G1, P0 with known severe endometriosis.  Details dictated in  history and physical.  Total abdominal hysterectomy with bilateral  salpingo-oophorectomy is discussed with the patient preoperatively.  Potential risks and complications have been discussed preoperatively  including but not limited to infection, organ damage, bleeding requiring  transfusion of blood products with possible HIV and hepatitis  acquisition, DVT, PE, pneumonia, fistula formation, ovarian remnant  syndrome, supracervical hysterectomy.  All questions were answered and  consent is signed on the chart.  Due to the known pelvic adhesions, Dr.  Brunilda Payor will place ureteral stents prior to the procedure.  This was  discussed.   FINDINGS:  Upper abdomen palpates normally.  Uterus is normal in size.  There are bilateral 3-cm endometriomas.  The ovaries are densely  adherent to the posterior uterine fundus and posterior cul-de-sac.  The  rectum is adherent to the posterior cul-de-sac.   PROCEDURE:  The patient is taken to operating room where she is  identified,  placed dorsosupine position and general anesthesia is  induced via endotracheal intubation.  At this point Dr. Brunilda Payor places  ureteral stents which were dictated under a separate note.  The patient  is then prepped abdominally and vaginally and draped in sterile fashion.  Skin is entered through a Pfannenstiel incision and dissection is  carried out in layers to the peritoneum.  Peritoneum is incised,  extended superiorly and inferiorly.  O'Sullivan-O' Conner retractor is  placed.  Bowel is packed away superiorly.  Then using sharp dissection  the ovaries are dissected free as well as the cul-de-sac is freed up.  This is done carefully with sharp and blunt dissection.  The ureters  could be palpated throughout process well clear of the area of surgery.  The left round ligament is ligated taken down with cautery.  Anterior  posterior leaf of broad ligament taken down.  The infundibulopelvic  ligament is identified, again clear of the ureter and clamped and taken  down.  Pedicle was ligated with a free tie and a suture of 0 Monocryl.  All suture will be 0 Monocryl unless otherwise designated.  The anterior  leaf of broad ligament is taken down, the bladder is pushed away.  The  uterine artery is skeletonized and taken down and singly ligated in a  separate pedicle.  Similar procedure is carried out on the right side.  Progressive bites were taken down the cardinal ligaments bilaterally.  Curved clamps were used to enter the vagina bilaterally.  Specimen is  then completely cut free.  The cervix is completely removed.  The cuff  was closed with figure-of-eights.  Careful inspection reveals good  hemostasis all around.  Packs were removed.  Anterior peritoneum is  closed in a running fashion with 0 Monocryl suture which is also used to  reapproximate pyramidalis muscle in the midline.  Anterior rectus fascia  is closed in running fashion with 0 PDS suture and skin is closed with  clips.  The  ureteral stents were removed intact.  All counts correct.  The patient is awakened, taken to recovery room in stable condition.      Guy Sandifer Henderson Cloud, M.D.  Electronically Signed     JET/MEDQ  D:  12/12/2006  T:  12/12/2006  Job:  [id number]

## 2010-06-28 NOTE — Op Note (Signed)
NAMEVALARIA, KOHUT                ACCOUNT NO.:  [id number]   MEDICAL RECORD NO.:  [id number]          PATIENT TYPE:  INP   LOCATION:  0006                         FACILITY:  [facility]   PHYSICIAN:  Wilmon Arms. Corliss Skains, M.D. DATE OF BIRTH:  12/27/1960   DATE OF PROCEDURE:  08/18/2008  DATE OF DISCHARGE:                               OPERATIVE REPORT   PREOPERATIVE DIAGNOSIS:  Rectal adenocarcinoma.   POSTOPERATIVE DIAGNOSIS:  Rectal adenocarcinoma.   PROCEDURE PERFORMED:  1. Laparoscopic converted to open low anterior resection.  2. Rigid sigmoidoscopy.   SURGEON:  Manus Rudd, MD.   ASSISTANT:  Dr. Derrell Lolling.   ANESTHESIA:  General.   INDICATIONS:  This is a 50 year old female who was recently diagnosed  with a rectal adenocarcinoma after seeing gross blood per rectum.  Biopsy confirmed adenocarcinoma located about 12 cm from the anal verge.  Staging CT was unremarkable, the remainder of the colon was  unremarkable.  Her preoperative CEA level was 2.6.  She presents now for  colon resection.   DESCRIPTION OF PROCEDURE:  The patient was brought to the operating  room, placed in the supine position on the operating room table.  After  an adequate level of general anesthesia was obtained, her legs were  placed in yellow fin stirrups in the lithotomy position.  A Foley  catheter was placed under sterile technique.  I performed a digital  rectal examination.  There was some thin liquid stool in the rectum  which was suctioned out.  We inserted a rigid sigmoidoscope.  I  visualized the tumor at about 12 cm.  There was a little bit of gross  blood.  We removed the sigmoidoscope and the patient's abdomen and  perineum were prepped with Betadine and draped in a sterile fashion.  A  time-out was taken to assure the proper patient and proper procedure.  The patient has a previous transverse infraumbilical incision.  We  excised this scar, this was fairly thickened.  Dissection was carried  down to the fascia which was opened vertically.  We entered the  peritoneal cavity.  A stay suture of 0 Vicryl was placed around the  fascial opening.  The Hasson cannula was inserted and secured with a  stay suture.  Pneumoperitoneum was obtained by insufflating CO2,  maintaining a maximum pressure of 15 mmHg.  The laparoscope was inserted  and we visualized a lot of adhesions in the lower midline leading down  into the pelvis.  There were also some adhesions along the descending  colon all the way up to the splenic flexure.  Two 5-mm ports were placed  in the lower abdomen, one on each side.  We then used cautery scissors  to take down these adhesions along the left side.  We tried to take down  some of the adhesions of the anterior abdominal wall heading down  towards the pelvis.  However, it became fairly obvious that these  adhesions were fairly thickened.  Therefore, the decision was made to  convert to an open procedure as I did not think we could get adequate  mobilization laparoscopically.  We made a vertical midline incision from  3 cm above the umbilicus down to the symphysis pubis.  The fascia was  opened.  We took down the omental adhesions to the anterior abdominal  wall.  The Balfour retractor was inserted.  The small bowel was packed  away in the upper abdomen.  We began mobilizing the sigmoid colon from  the left side of the abdominal wall.  The patient has a lot of loose  redundant colon and all of this appeared healthy and free of  diverticula.  We opened the peritoneum along the right side of the  pelvis along the side of the rectum.  With bluntly dissected the  retroperitoneum we could easily identify the right ureter.  This was  preserved laterally.  We opened the peritoneum on the left side and also  identified the ureter.  Anterior to the rectum there was a lot of very  dense scar tissue from the patient's previous hysterectomy.  We then  selected an area in the  distal sigmoid colon.  We divided this with a  GIA 75 stapler.  The mesocolon was then taken down to the sacral  promontory with the LigaSure device.  We entered the presacral space and  used a sponge stick to bluntly dissect posteriorly.  The ureters were  preserved laterally.  We continued our dissection of tissue in the  posterior presacral tissue plane all the way down to the floor of the  pelvis.  We then began working along the lateral stalks.  These were  divided with the LigaSure.  We then dissected through the very dense  scar tissue anteriorly peeling the vaginal cuff off of the anterior  rectum.  With extensive dissection we were able to identify the anterior  wall of the rectum.  We continued dissecting inferiorly, pulling the  vaginal cuff anteriorly.  Once we had completely dissected down to the  floor of the pelvis and had taken all of the mesocolon attachments, the  contoured green load stapler was brought onto the field.  This was used  to transect the colon.  The distal margin was marked with a suture and  sent for gross pathologic examination.  The pelvis was then thoroughly  irrigated.  Several small bleeding areas were controlled with suture  ligature and cautery.  The pathologist called with a verbal report.  We  had a 2 cm distal margin and a 13 cm proximal margin.  Once we were  satisfied with hemostasis we identified the proximal staple line.  The  staple line was removed after we had cleared all of the pericolonic  adipose tissue.  The stapler was removed and we used EEA sizers to  select a 33-mm stapler.  The anvil was inserted after we placed a  pursestring suture of 2-0 Prolene.  The pursestring was tied down around  the stem of the anvil.  I then went below and dilated her rectum up to 3  fingers.  We inserted the stapler and advanced the spike just posterior  to the staple line.  This was mated with the anvil and was tightened  down making sure there were no  impinging structures.  The stapler was  held for several minutes and then was fired.  The stapler was removed.  Two intact doughnuts were removed from the stapler.  The distal donut  was sent for additional pathologic examination.  The pelvis was filled  with saline.  I inserted  the rigid sigmoidoscope and insufflated.  There  was good distention, but no sign of leak.  There was no bleeding  internally.  The sigmoidoscope was removed.  We thoroughly irrigated the  pelvis and removed all our sponges.  The fascia was closed with double-  stranded #1 PDS.  Staples were used to close the skin.  The patient was  extubated and brought to recovery in stable condition.  All sponge,  instrument and needle counts were correct.      Wilmon Arms. Tsuei, M.D.  Electronically Signed     MKT/MEDQ  D:  08/18/2008  T:  08/18/2008  Job:  [id number]   cc:   Barbette Hair. Arlyce Dice, MD,FACG  [address]  [place]  [place] [address]

## 2010-06-28 NOTE — Op Note (Signed)
NAMEGIULIETTA, Kelli Cruz                ACCOUNT NO.:  [id number]   MEDICAL RECORD NO.:  [id number]          PATIENT TYPE:  AMB   LOCATION:  DSC                          FACILITY:  [facility]   PHYSICIAN:  Wilmon Arms. Corliss Skains, M.D. DATE OF BIRTH:  [birth year]   DATE OF PROCEDURE:  11/07/2006  DATE OF DISCHARGE:  11/07/2006                               OPERATIVE REPORT   PREOPERATIVE DIAGNOSIS:  Left breast mass.   POSTOPERATIVE DIAGNOSIS:  Left breast mass.   PROCEDURE PERFORMED:  Left breast excisional biopsy.   SURGEON:  Wilmon Arms. Corliss Skains, M.D., FACS   ANESTHESIA:  General.   INDICATIONS:  The patient is a 50 year old female who presents with a  nodule on the left upper outer quadrant of her breast.  This was first  noted on screening mammogram.  Ultrasound showed this only to be a  complex cyst.  The patient has a family history with her mother having  had bilateral breast cancers.  Due to her history and the persistence of  this mass, the decision was made to proceed with an excisional biopsy.   DESCRIPTION OF PROCEDURE:  The patient was brought to the operating room  and placed in the supine position on the operating table.  After an  adequate level of general anesthesia was obtained, the patient's left  breast was prepped with Betadine and draped in sterile fashion.  I could  palpate some firmness at the edge of her left breast at 2 o'clock about  12 cm from the nipple.  This area was infiltrated 0.25% Marcaine.  I  made a transverse incision over this area.  I raised skin flaps in all  directions.  The area around the mass was grasped with an Allis clamp.  Cautery was used to remove a core of tissue around the palpable area.  We continued down until we were deep to the lesion.  The mass was  removed and oriented with a long suture lateral and short suture  superior.  The specimen sent for pathologic examination as a fresh  specimen.  The wound was then inspected for hemostasis.  The  wound was  closed with 3-0 Vicryl and subcuticular 4-0 Monocryl.  Steri-Strips and  clean dressings were applied.  The patient was extubated and brought to  the recovery room in stable condition.  All sponge, instrument, and  needle counts were correct.      Wilmon Arms. Tsuei, M.D.  Electronically Signed     MKT/MEDQ  D:  11/07/2006  T:  11/07/2006  Job:  [id number]   cc:   Guy Sandifer. Henderson Cloud, M.D.

## 2010-06-28 NOTE — Discharge Summary (Signed)
NAMECAELEN, HIGINBOTHAM                ACCOUNT NO.:  [id number]   MEDICAL RECORD NO.:  [id number]          PATIENT TYPE:  INP   LOCATION:  9302                          FACILITY:  [facility]   PHYSICIAN:  Guy Sandifer. Henderson Cloud, M.D. DATE OF BIRTH:  1961/02/05   DATE OF ADMISSION:  12/12/2006  DATE OF DISCHARGE:  12/15/2006                               DISCHARGE SUMMARY   ADMISSION DIAGNOSIS:  Severe endometriosis.   DISCHARGE DIAGNOSIS:  Severe endometriosis.   PROCEDURE:  On December 12, 2006, total abdominal hysterectomy with  bilateral salpingo-oophorectomy and extensive lysis of adhesions.   REASON FOR ADMISSION:  This patient is a 50 year old  Monday/Wednesday/Friday, gravida 1, para 0, with known severe  endometriosis.  She is admitted for surgical management.   HOSPITAL COURSE:  She is admitted to the hospital and undergoes the  above procedure.  Estimated blood loss was 300 mL.  On the evening of  surgery, she has good pain relief.  Vital signs are stable.  She is  afebrile.  Urine output is tinted red status post ureteral stent  placement during surgery which we removed at the completion of the  procedure.  On the first postoperative day, she is tolerating toast and  Jell-O.  She has not yet passed flatus, but has adequate pain relief.  Vital signs are stable.  She is afebrile with a hemoglobin of 10.6.  On  the second postoperative day she still has some mild nausea and quite a  bit of tenderness with difficulty moving around.  She is afebrile with  stable vital signs.  It is felt that she needs one more night to recover  adequately to go home the next day.  On the day of discharge, she is  feeling better, passing flatus, and able to ambulate better.  Vital  signs are stable and she is afebrile.  Abdomen is soft and the incision  is healing well.   CONDITION ON DISCHARGE:  Good.   DIET:  Regular as tolerated.   ACTIVITY:  No lifting, no operation of automobiles, no vaginal  entry.   She is to call the office for problems including, but not limited to  temperature over 101 degrees, increasing pain, persistent nausea and  vomiting, or heavy vaginal bleeding.   DISCHARGE MEDICATIONS:  1. Percocet 5/325 mg #40 one to two p.o. q.6 hours p.r.n.  2. Phenergan 25 mg #10 one p.o. q.6 hours p.r.n.  3. Ibuprofen 600 mg p.o. q.6 hours p.r.n.  4. Multivitamin daily.   FOLLOWUP:  In the office in two weeks.      Guy Sandifer Henderson Cloud, M.D.  Electronically Signed     JET/MEDQ  D:  12/15/2006  T:  12/16/2006  Job:  [id number]

## 2010-07-01 NOTE — H&P (Signed)
NAME:  Kelli Cruz, Kelli Cruz                ACCOUNT NO.:  [id number]   MEDICAL RECORD NO.:  [id number]          PATIENT TYPE:  AMB   LOCATION:  SDC                           FACILITY:  [facility]   PHYSICIAN:  Guy Sandifer. Tomblin II, M.D.DATE OF BIRTH:  08-Feb-1961   DATE OF ADMISSION:  DATE OF DISCHARGE:                                HISTORY & PHYSICAL   CHIEF COMPLAINT:  Miscarriage.   HISTORY OF PRESENT ILLNESS:  This patient is a 50 year old, married, white  female, G1, P0, with an uncertain LMP of February 19, 2004 who has had some  bleeding and some mild pelvic pain.  Ultrasound on March 28, 2004  revealed an intrauterine gestational sac consistent with five weeks and one  day.  A yolk sac and fetal pole were not noted at that time.  Follow up  ultrasound on April 04, 2004 revealed an intrauterine gestational sac  consistent with five weeks and four days.  A yolk sac is noted.  No fetal  pole was noted.  A right ovarian cyst measuring 3.2 cm with low level echoes  as well as a 3.6 cm left ovarian cyst with low level echoes were also noted.  Quantitative hCG on April 04, 2004 is 4744 and on April 04, 2004 is  4629.  After discussing probable missed AB, and possible ectopic pregnancy, options  of management were discussed.  The patient is being admitted for dilatation  and evacuation.  Potential risks and complications have been discussed with  the patient preoperatively.   PAST MEDICAL HISTORY:  1.  Hypothyroidism.  2.  Anxiety.   PAST SURGICAL HISTORY:  1.  Laparoscopy in 1997 diagnostic of endometriosis and ovarian cysts.  2.  Ganglion cyst removed from thumb.   MEDICATIONS:  Synthroid 0.2 mg daily.   ALLERGIES:  No known drug allergies.   SOCIAL HISTORY:  She consumes alcohol on an occasional basis.  She denies  tobacco or drug abuse.   FAMILY HISTORY:  Coronary artery disease in mother and father.  Diabetes in  mother, father, and brother and paranoid schizophrenia in  brother.   REVIEW OF SYSTEMS:  NEUROLOGIC:  She denies headache.  CARDIAC:  She denies chest pain.  PULMONARY:  She denies shortness of breath.   PHYSICAL EXAMINATION:  LUNGS:  Clear to auscultation.  HEART:  Regular rate and rhythm.  ABDOMEN:  Non-tender.  PELVIC:  Exam deferred.   LABORATORY DATA:  [name] blood type is O negative.  She did receive  RhoGAM on March 29, 2004.   ASSESSMENT:  Probable missed abortion.   PLAN:  Dilatation and evacuation.      JET/MEDQ  D:  04/06/2004  T:  04/06/2004  Job:  [id number]

## 2010-07-01 NOTE — H&P (Signed)
NAMEQUYEN, CUTSFORTH                ACCOUNT NO.:  [id number]   MEDICAL RECORD NO.:  [id number]          PATIENT TYPE:  AMB   LOCATION:  SDC                           FACILITY:  [facility]   PHYSICIAN:  Guy Sandifer. Henderson Cloud, M.D. DATE OF BIRTH:  25-Oct-1960   DATE OF ADMISSION:  05/31/2006  DATE OF DISCHARGE:                              HISTORY & PHYSICAL   CHIEF COMPLAINT:  Desires permanent sterilization;  painful menses.   HISTORY OF PRESENT ILLNESS:  The patient is a 50 year old married white  female, G1, P0, with known endometriosis. She is having current  increasing pain with her menses.  She has bilateral lower abdominal pain  occurring on most days.  In addition, she wants permanent sterilization.  Alternate methods of contraception have been reviewed.  Potential risks  of surgery have also been reviewed preoperatively.   PAST MEDICAL HISTORY:  1. Bipolar disorder.  2. Hypothyroidism.   PAST SURGICAL HISTORY:  1. Laparoscopy in 1997.  2. Ganglion cyst removed from thumb.  3. Dilatation and evacuation for miscarriage.   OBSTETRIC HISTORY:  Miscarriage x1.   MEDICATIONS:  Depakote, Lamictal, Wellbutrin, and Synthroid.   ALLERGIES:  Question of allergy to PTU leading to hives.   SOCIAL HISTORY:  Consumes alcohol on occasional basis, denies tobacco or  drug abuse.   FAMILY HISTORY:  Coronary artery disease in mother and father, diabetes  in mother, father, and brother, and paranoid schizophrenia in brother.   REVIEW OF SYSTEMS:  NEUROLOGIC:  Denies headache.  CARDIAC:  Denies  chest pain.  PULMONARY:  Denies shortness of breath.  GI:  Denies recent  changes in bowel habits.   PHYSICAL EXAMINATION:  VITAL SIGNS: Height 5 feet 2 inches, weight 220  pounds.  Blood pressure 102/78.  HEENT/NECK:  Without thyromegaly.  LUNGS:  Clear to auscultation.  HEART:  Regular rate and rhythm.  BACK:  Without CVA tenderness.  BREASTS:  Without mass, retraction, discharge.  ABDOMEN:  Soft,  nontender, without masses.  PELVIC:  Vulva, vagina, cervix without lesion.  Uterus is normal size,  mobile, nontender.  Adnexa nontender without mass.  EXTREMITIES:  Grossly within normal limits.  NEUROLOGIC:  Exam grossly within normal limits.   Pelvic ultrasound in my office on May 28, 2006, reveals bilateral  ovarian cyst measuring 2.8 x 1.6 cm each with internal echoes possibly  consistent with endometriomas versus hemorrhagic cysts.  Uterus measures  6.35 x 3.75 x 4.3 cm.   ASSESSMENT:  1. Dysmenorrhea with history of endometriosis.  2. Desires permanent sterilization.   PLAN:  Laparoscopy with bilateral tubal ligation with  Filshie clips,  possible bilateral ovarian cystectomy, possible unilateral salpingo-  oophorectomy.      Guy Sandifer Henderson Cloud, M.D.  Electronically Signed     JET/MEDQ  D:  05/28/2006  T:  05/28/2006  Job:  [id number]

## 2010-07-01 NOTE — Op Note (Signed)
NAME:  Kelli Cruz, Kelli Cruz                ACCOUNT NO.:  [id number]   MEDICAL RECORD NO.:  [id number]          PATIENT TYPE:  AMB   LOCATION:  SDC                           FACILITY:  [facility]   PHYSICIAN:  Guy Sandifer. Tomblin II, M.D.DATE OF BIRTH:  02-Oct-1960   DATE OF PROCEDURE:  04/07/2004  DATE OF DISCHARGE:                                 OPERATIVE REPORT   PREOPERATIVE DIAGNOSES:  Missed abortion.   POSTOPERATIVE DIAGNOSES:  Missed abortion.   PROCEDURE:  1.  Dilatation evacuation.  2.  1% Xylocaine paracervical block.   SURGEON:  Guy Sandifer. Henderson Cloud, M.D.   ANESTHESIA:  General with LMA.   SPECIMENS:  Endometrial curettings.   ESTIMATED BLOOD LOSS:  Less than 30 mL.   INDICATIONS FOR PROCEDURE:  This patient is a 50 year old, married white  female, G1, P0 with a missed abortion. Details are dictated in the history  and physical. Dilatation and evacuation was discussed with the patient. The  potential risks and complications have been reviewed preoperatively  including but not limited to infection, uterine perforation, bowel, bladder  and ureteral damage, bleeding requiring transfusion of blood products,  possible transfusion reaction, HIV and hepatitis acquisition, DVT, PE,  pneumonia, laparoscopy, laparotomy, hysterectomy. All questions are answered  and consent is signed on the chart.   DESCRIPTION OF PROCEDURE:  The patient is taken to the operating room where  she is identified, placed in dorsal supine position and general anesthesia  is secured with LMA. She is then placed in the dorsal lithotomy position  where she is prepped, bladder straight catheterized and she was draped in a  sterile fashion.  Examination reveals the uterus to be 5-6 weeks in size. A  bivalve speculum was placed in the vagina and the anterior cervical lip was  injected with 1% Xylocaine and grasped with a single tooth tenaculum.  Paracervical block at the 2, 4, 5, 7, 8 and 10 o'clock positions with  approximately 20 mL total of 1% Xylocaine is placed. The cervix is gently  and progressively dilated to a 29 Pratt dilator. A #7 curved curette is  placed in the endometrial cavity and suction curettage is carried out for a  small amount of tissue. Alternating sharp and suction curettage are carried  out and the cavity is cleaned and excellent hemostasis is noted. 20 units of  Pitocin are added to the remaining 700 mL of IV  fluids. All instruments are removed. All counts are correct. The patient is  transferred to the recovery room in stable condition. RhoGAM will be  administered per protocol. Methergine has been prescribed. Followup is in  the office in two weeks.      JET/MEDQ  D:  04/07/2004  T:  04/07/2004  Job:  [id number]

## 2010-07-01 NOTE — Op Note (Signed)
   NAME:  Kelli Cruz, Kelli Cruz NO.:  [id number]   MEDICAL RECORD NO.:  [id number]                   PATIENT TYPE:   LOCATION:                                       FACILITY:   PHYSICIAN:  Nicki Reaper, M.D.                 DATE OF BIRTH:   DATE OF PROCEDURE:  09/24/2001  DATE OF DISCHARGE:                                 OPERATIVE REPORT   PREOPERATIVE DIAGNOSIS:  Mucoid cyst, right thumb interphalangeal joint.   POSTOPERATIVE DIAGNOSIS:  Mucoid cyst, right thumb interphalangeal joint.   OPERATION:  Excision of cyst, debridement of interphalangeal joint, right  thumb.   SURGEON:  Nicki Reaper, M.D.   ASSISTANT:  __________   ANESTHESIA:  Forearm-based IV regional.   ANESTHESIOLOGIST:  Maren Beach, M.D.   HISTORY:  The patient is a 50 year old female with a history of a mass on  the dorsal  radial aspect of the  IP joint of her thumb and proximal  phalanx.   DESCRIPTION OF PROCEDURE:  The patient was brought to the operating room  where a forearm-based IV regional anesthetic was carried out without  difficulty. She was prepped and draped using Betadine scrub and solution  with the right arm free.   A curvilinear incision was made over the IP joint and mass and carried down  through the subcutaneous tissue. Bleeders were electrocauterized and the  mass was immediately encountered to the radial side of the  extensor tendon.  Blunt and sharp dissection and this was dissected free, followed beneath the  tendon into the capsule. The cyst was excised and sent to pathology.   The joint was then opened. Debridement was performed to  both radial and  ulnar aspects with small rongeurs. A specimen was sent to pathology. The  wound was irrigated. The skin was closed with interrupted 5-0 nylon sutures.  A sterile compressive dressing and  a splint to the finger were applied.   The patient tolerated the procedure well and was taken to the recovery  room  in satisfactory condition. She is discharged to return to the [facility]  [place] in one week, on Vicodin and Keflex.                                               Nicki Reaper, M.D.    GRK/MEDQ  D:  09/24/2001  T:  09/27/2001  Job:  [id number]

## 2010-07-01 NOTE — Op Note (Signed)
Kelli Cruz, Kelli Cruz                ACCOUNT NO.:  [id number]   MEDICAL RECORD NO.:  [id number]          PATIENT TYPE:  AMB   LOCATION:  SDC                           FACILITY:  [facility]   PHYSICIAN:  Guy Sandifer. Henderson Cloud, M.D. DATE OF BIRTH:  1960/11/16   DATE OF PROCEDURE:  05/31/2006  DATE OF DISCHARGE:                               OPERATIVE REPORT   PREOPERATIVE DIAGNOSIS:  1. Desires permanent sterilization.  2. Dysmenorrhea.   POSTOPERATIVE DIAGNOSIS:  1. Desires permanent sterilization.  2. Severe endometriosis.   PROCEDURE:  Laparoscopy with bilateral tubal ligation with Filshie clips  and cautery.   SURGEON:  Harold Hedge, M.D.   ANESTHESIA:  General endotracheal intubation.   ESTIMATED BLOOD LOSS:  Drops.   INDICATIONS AND CONSENT:  The patient is a 50 year old married white  female G1, P0 with known endometriosis.  She is having increasing pain  with her menses.  Also desires permanent sterilization.  Details  dictated in the history and physical.  Laparoscopic tubal ligation,  possible bilateral ovarian cystectomy, possible unilateral salpingo-  oophorectomy has been discussed with the patient preoperatively.  Potential risks and complications have been discussed preoperatively  including but not limited to infection, bowel, bladder, ureteral damage,  bleeding requiring transfusion of blood products with possible  transfusion reaction, HIV and hepatitis acquisition, DVT, PE, pneumonia,  recurrent pain.  Permanence, failure rate, and increased ectopic risk of  tubal ligation has also been reviewed.   FINDINGS:  Upper abdomen is grossly normal.  Uterus is normal in size.  Anterior cul-de-sac is normal.  Both ovaries are firmly adherent to the  posterior lower uterine segment as well as the posterior cul-de-sac.  The rectum is also adherent to the posterior lower uterine segment.  All  of which completely obliterate the posterior cul-de-sac.  Both fallopian  tubes  identified.  The right ovary contains a 2-cm smooth translucent  cyst.  The left ovary contains a 2 cm cyst.   PROCEDURE:  The patient taken to operating room where she is identified,  placed dorsosupine position and general anesthesia is induced via  endotracheal intubation.  She is placed in dorsal lithotomy position  where she is prepped abdominally and vaginally. Bladder straight  catheterized.  Hulka tenaculum was placed in uterus as manipulator and  she is draped in sterile fashion.  The infraumbilical and suprapubic  areas injected in the midline with 0.5% plain Marcaine.  A small  infraumbilical incision was made and a disposable Veress needle was  placed on the first attempt without difficulty.  Placement verified with  the syringe drop test and 2 liters of gas were insufflated under low  pressure with good tympany in the right upper quadrant.  Veress needle  was then removed and a 10/11 XL bladeless disposable trocar sleeve was  placed using direct visualization with the diagnostic laparoscopic.  After placement this was changed out to the operative laparoscopic.  A  small suprapubic incision was made in the midline and a 5-mm XL  bladeless disposable trocar sleeve was placed under direct visualization  without difficulty.  The  above findings were noted.  The right fallopian  tube was identified from cornu to fimbria and a Filshie clip was placed  in the proximal 1/3 of fallopian tube.  Similar procedure is carried out  on the left side.  A Filshie clip applicator was then removed and  inspection reveals the right fallopian tube to be clearly within the  Filshie clip.  The heel of the clip can be seen through the mesosalpinx.  On the left side, on one side of the fallopian tube, the entire tube  appeared to be within the clip.  However, on the other side secondary to  edema it was not absolutely certain that the entire width of the tube  was then the width the clip.  Therefore  the Filshie clip applicator was  reinserted and a second clip was placed immediately distal to the first  clip.  After removal of the Filshie clip applicator inspection revealed  again essentially the same findings.  The mesosalpinx on the left side  was edematous which made it very difficult to tell whether the entire  width of the tube was within the clip reviewing it from the topside.  Therefore immediately distal to the Filshie clips.  Bipolar cautery is  carried out the entire width of the fallopian tube as well.  The right  fallopian tube spontaneously drained for a small amount of clear serous  fluid.  The left ovarian cyst drained a small amount of clot.  Posterior  cul-de-sac was totally obliterated. Copious irrigation is carried out  and all returns as clear.  The suprapubic trocar sleeve was removed and  no bleeding is noted.  Pneumoperitoneum is completely reduced and the  umbilical trocar sleeve was removed.  The umbilical incision is closed  with 3-0 Vicryl sutures in interrupted fashion on the skin.  Both  incisions were closed with Dermabond.  Hulka tenaculum was removed and  good hemostasis was noted.  All counts correct.  The patient is  awakened, taken to recovery room in stable condition.      Guy Sandifer Henderson Cloud, M.D.  Electronically Signed     JET/MEDQ  D:  05/31/2006  T:  05/31/2006  Job:  [id number]

## 2010-07-06 ENCOUNTER — Telehealth: Payer: Self-pay | Admitting: *Deleted

## 2010-07-06 DIAGNOSIS — E538 Deficiency of other specified B group vitamins: Secondary | ICD-10-CM

## 2010-07-06 NOTE — Telephone Encounter (Signed)
Pt is to start getting monthly B12 shots.  She lives in [place] and works in [place] so she is asking if she can get this at another location, other than here- maybe at [place].  Please advise.

## 2010-07-06 NOTE — Telephone Encounter (Signed)
I'm fine with that if they are  Please check with [facility] office and see if that is possible --thanks

## 2010-07-06 NOTE — Telephone Encounter (Signed)
Left message on nurse v/m at [facility] office.

## 2010-07-13 NOTE — Telephone Encounter (Signed)
Nicki Guadalajara from [facility] office called back and she had cked with Melissa the nurse practioner and she said it was OK for pt to come to [facility] office to receive B12 injections as long as Dr Milinda Antis would put the order in the system for B12 injections with frequency and also that Dr Milinda Antis would follow and monitor pt's B12 levels. I told Nicki Guadalajara I would send this note to Dr. Milinda Antis and if she said OK I would notify pt. Nicki Guadalajara said she would let her front staff also know and anticipate a call from the pt to schedule appt for B12 injection. Thank you.

## 2010-07-13 NOTE — Telephone Encounter (Signed)
Pt was also notified that she is to follow up with Dr Milinda Antis to be followed for her B12 levels.

## 2010-07-13 NOTE — Telephone Encounter (Signed)
Patient notified as instructed by telephone. Pt will call [facility]  to schedule appt for B12 injections.

## 2010-07-13 NOTE — Telephone Encounter (Signed)
Thanks- for putting on med list  Good plan

## 2010-07-14 ENCOUNTER — Telehealth: Payer: Self-pay | Admitting: Family

## 2010-07-14 NOTE — Telephone Encounter (Signed)
Patient of Dr Milinda Antis @  [facility] want to come here for her B12  Injection ,she works in [place]    Please call

## 2010-07-15 ENCOUNTER — Ambulatory Visit: Payer: BC Managed Care – PPO | Admitting: Family

## 2010-07-15 ENCOUNTER — Ambulatory Visit: Payer: BC Managed Care – PPO

## 2010-07-15 DIAGNOSIS — E538 Deficiency of other specified B group vitamins: Secondary | ICD-10-CM

## 2010-07-15 MED ORDER — CYANOCOBALAMIN 1000 MCG/ML IJ SOLN
1000.0000 ug | Freq: Once | INTRAMUSCULAR | Status: AC
  Administered 2010-07-15: 1000 ug via INTRAMUSCULAR

## 2010-07-15 NOTE — Telephone Encounter (Signed)
Pt has nurse visit for b 12 injection today.

## 2010-08-10 ENCOUNTER — Telehealth: Payer: Self-pay | Admitting: Gastroenterology

## 2010-08-11 NOTE — Telephone Encounter (Signed)
Pt states she had surgery for colon cancer and has a colon every year. She has noticed a change in her bowel habits, she has urgency and diarrhea. Pt requesting an appt. Pt scheduled to see Dr. Arlyce Dice 08/25/10@11 :15am. Pt aware of appt date and time.

## 2010-08-11 NOTE — Telephone Encounter (Signed)
Left message for pt to call back  °

## 2010-08-15 ENCOUNTER — Ambulatory Visit (INDEPENDENT_AMBULATORY_CARE_PROVIDER_SITE_OTHER): Payer: BC Managed Care – PPO | Admitting: Family

## 2010-08-15 DIAGNOSIS — E538 Deficiency of other specified B group vitamins: Secondary | ICD-10-CM

## 2010-08-15 MED ORDER — CYANOCOBALAMIN 1000 MCG/ML IJ SOLN
1000.0000 ug | Freq: Once | INTRAMUSCULAR | Status: AC
  Administered 2010-08-15: 1000 ug via INTRAMUSCULAR

## 2010-08-16 ENCOUNTER — Ambulatory Visit: Payer: BC Managed Care – PPO | Admitting: Family Medicine

## 2010-08-22 ENCOUNTER — Other Ambulatory Visit: Payer: Self-pay | Admitting: *Deleted

## 2010-08-22 MED ORDER — GLIPIZIDE ER 5 MG PO TB24: 5.0000 mg | ORAL_TABLET | Freq: Every day | ORAL | Status: DC

## 2010-08-25 ENCOUNTER — Encounter: Payer: Self-pay | Admitting: Gastroenterology

## 2010-08-25 ENCOUNTER — Ambulatory Visit (INDEPENDENT_AMBULATORY_CARE_PROVIDER_SITE_OTHER): Payer: BC Managed Care – PPO | Admitting: Gastroenterology

## 2010-08-25 VITALS — BP 130/90 | HR 104 | Ht 62.5 in | Wt 203.6 lb

## 2010-08-25 DIAGNOSIS — C187 Malignant neoplasm of sigmoid colon: Secondary | ICD-10-CM

## 2010-08-25 DIAGNOSIS — R197 Diarrhea, unspecified: Secondary | ICD-10-CM | POA: Insufficient documentation

## 2010-08-25 MED ORDER — RIFAXIMIN 200 MG PO TABS: 200.0000 mg | ORAL_TABLET | Freq: Three times a day (TID) | ORAL | Status: AC

## 2010-08-25 NOTE — Assessment & Plan Note (Signed)
Plan followup colonoscopy 

## 2010-08-25 NOTE — Assessment & Plan Note (Addendum)
This remains a severe problem but actually preceded her colon surgery. She may have bacterial overgrowth with her diabetes. Microscopic colitis is also a consideration.  Recommendations #1 trial of xifaxan 200 mg three times day for 7 days #2 colonoscopy. If no abnormalities are  seen and the patient is not improved with antibiotic therapy  I'll take biopsies to rule out microscopic colitis

## 2010-08-25 NOTE — Progress Notes (Signed)
History of Present Illness:  Kelli Cruz has returned for  reevaluation of diarrhea. This continues to be a severe problem. She had may have multiple loose stools a day accompanied by severe urgency and incontinence. She will awaken at night to move her bowels. Diarrhea preceded her colon surgery for colon cancer. Colonoscopy one year ago was limited because of a poor prep. No gross abnormalities were seen. The patient has non-insulin-dependent diabetes.    Review of Systems: Pertinent positive and negative review of systems were noted in the above HPI section. All other review of systems were otherwise negative.    Current Medications, Allergies, Past Medical History, Past Surgical History, Family History and Social History were reviewed in [employer] [facility] electronic medical record  Vital signs were reviewed in today's medical record. Physical Exam: General: Well developed , well nourished, no acute distress Head: Normocephalic and atraumatic Eyes:  sclerae anicteric, EOMI Ears: Normal auditory acuity Mouth: No deformity or lesions Lungs: Clear throughout to auscultation Heart: Regular rate and rhythm; no murmurs, rubs or bruits Abdomen: Soft,and non distended. No masses, hepatosplenomegaly or hernias noted. Normal Bowel sounds; there is mild tenderness bilaterally in the lower quadrants Rectal:deferred Musculoskeletal: Symmetrical with no gross deformities  Pulses:  Normal pulses noted Extremities: No clubbing, cyanosis, edema or deformities noted Neurological: Alert oriented x 4, grossly nonfocal Psychological:  Alert and cooperative. Normal mood and affect

## 2010-08-25 NOTE — Patient Instructions (Signed)
Colonoscopy A colonoscopy is an exam to evaluate your entire colon. In this exam, your colon is cleansed. A long fiberoptic tube is inserted through your rectum and into your colon. The fiberoptic scope (endoscope) is a long bundle of enclosed and very flexible fibers. These fibers transmit light to the area examined and send images from that area to your caregiver. Discomfort is usually minimal. You may be given a drug to help you sleep (sedative) during or prior to the procedure. This exam helps to detect lumps (tumors), polyps, inflammation, and areas of bleeding. Your caregiver may also take a small piece of tissue (biopsy) that will be examined under a microscope. BEFORE THE PROCEDURE  A clear liquid diet may be required for 2 days before the exam.   Liquid injections (enemas) or laxatives may be required.   A large amount of electrolyte solution may be given to you to drink over a short period of time. This solution is used to clean out your colon.   You should be present 1 prior to your procedure or as directed by your caregiver.   Check in at the admissions desk to fill out necessary forms if not preregistered. There will be consent forms to sign prior to the procedure. If accompanied by friends or family, there is a waiting area for them while you are having your procedure.  LET YOUR CAREGIVER KNOW ABOUT:  Allergies to food or medicine.  Medicines taken, including vitamins, herbs, eyedrops, over-the-counter medicines, and creams.   Use of steroids (by mouth or creams).   Previous problems with anesthetics or numbing medicines.   History of bleeding problems or blood clots.  Previous surgery.   Other health problems, including diabetes and kidney problems.   Possibility of pregnancy, if this applies.   AFTER THE PROCEDURE  If you received a sedative and/or pain medicine, you will need to arrange for someone to drive you home.   Occasionally, there is a little blood passed  with the first bowel movement. DO NOT be concerned.  HOME CARE INSTRUCTIONS  It is not unusual to pass moderate amounts of gas and experience mild abdominal cramping following the procedure. This is due to air being used to inflate your colon during the exam. Walking or a warm pack on your belly (abdomen) may help.   You may resume all normal meals and activities after sedatives and medicines have worn off.   Only take over-the-counter or prescription medicines for pain, discomfort, or fever as directed by your caregiver. DO NOT use aspirin or blood thinners if a biopsy was taken. Consult your caregiver for medicine usage if biopsies were taken.  FINDING OUT THE RESULTS OF YOUR TEST Not all test results are available during your visit. If your test results are not back during the visit, make an appointment with your caregiver to find out the results. Do not assume everything is normal if you have not heard from your caregiver or the medical facility. It is important for you to follow up on all of your test results. SEEK IMMEDIATE MEDICAL CARE IF:  You pass large blood clots or fill a toilet with blood following the procedure. This may also occur 10 to 14 days following the procedure. This is more likely if a biopsy was taken.   You develop abdominal pain that keeps getting worse and cannot be relieved with medicine.  Document Released: 01/28/2000 Document Re-Released: 04/26/2009 [facility] Patient Information 2011 [place], [place]. Your Colonoscopy is scheduled on 09/07/2010 at  3pm We are giving you a sample of Suprep

## 2010-08-26 ENCOUNTER — Encounter: Payer: Self-pay | Admitting: Gastroenterology

## 2010-08-31 ENCOUNTER — Telehealth: Payer: Self-pay | Admitting: *Deleted

## 2010-08-31 NOTE — Telephone Encounter (Signed)
Completed form faxed to [phone] as instructed and sent to be scanned.

## 2010-08-31 NOTE — Telephone Encounter (Signed)
Form for diabetic supplies is on your shelf.  I checked with the patient and she does want these supplies.

## 2010-08-31 NOTE — Telephone Encounter (Signed)
Form in IN box-thanks

## 2010-09-07 ENCOUNTER — Encounter: Payer: Self-pay | Admitting: Gastroenterology

## 2010-09-07 ENCOUNTER — Ambulatory Visit (AMBULATORY_SURGERY_CENTER): Payer: BC Managed Care – PPO | Admitting: Gastroenterology

## 2010-09-07 VITALS — BP 101/65 | HR 63 | Temp 98.4°F | Resp 23 | Ht 63.0 in | Wt 197.0 lb

## 2010-09-07 DIAGNOSIS — Z85038 Personal history of other malignant neoplasm of large intestine: Secondary | ICD-10-CM

## 2010-09-07 DIAGNOSIS — R197 Diarrhea, unspecified: Secondary | ICD-10-CM

## 2010-09-07 MED ORDER — SODIUM CHLORIDE 0.9 % IV SOLN: 500.0000 mL | INTRAVENOUS | Status: DC

## 2010-09-07 NOTE — Patient Instructions (Signed)
Normal colon  Await biopsy results  Call office next 1-3 days to schedule f/u visit in 2-3 wks  See green and blue sheets for additional d/c instructions.

## 2010-09-08 ENCOUNTER — Telehealth: Payer: Self-pay | Admitting: *Deleted

## 2010-09-08 NOTE — Telephone Encounter (Signed)
Follow up Call- Patient questions:  Do you have a fever, pain , or abdominal swelling? no Pain Score  0 *  Have you tolerated food without any problems? yes  Have you been able to return to your normal activities? yes  Do you have any questions about your discharge instructions: Diet   no Medications  no Follow up visit  no  Do you have questions or concerns about your Care? no  Actions: * If pain score is 4 or above: No action needed, pain <4. Pt states has no pain but a little sore in lower abdominal area. Told pt could be trapped air so pass gas if feels pressure, try warm fluids to get air to pass out and if the soreness increases or if pain starts please call office. Pt returned verbal understanding of instructions. E Hunter Bachar RN

## 2010-09-15 ENCOUNTER — Ambulatory Visit: Payer: BC Managed Care – PPO

## 2010-09-15 DIAGNOSIS — Z0289 Encounter for other administrative examinations: Secondary | ICD-10-CM

## 2010-09-22 ENCOUNTER — Telehealth: Payer: Self-pay | Admitting: Gastroenterology

## 2010-09-22 NOTE — Telephone Encounter (Signed)
Pt states that when she eats something it is going right through her. Pt states nothing was found on her colon. She states that sometimes she doesn't even make it to the bathroom. Pt scheduled to see Willette Cluster NP 09/23/10@11am . Pt aware of appt date and time.

## 2010-09-23 ENCOUNTER — Other Ambulatory Visit: Payer: BC Managed Care – PPO

## 2010-09-23 ENCOUNTER — Encounter: Payer: Self-pay | Admitting: Nurse Practitioner

## 2010-09-23 ENCOUNTER — Ambulatory Visit (INDEPENDENT_AMBULATORY_CARE_PROVIDER_SITE_OTHER): Payer: BC Managed Care – PPO | Admitting: Nurse Practitioner

## 2010-09-23 VITALS — BP 108/78 | HR 88 | Ht 63.0 in | Wt 197.0 lb

## 2010-09-23 DIAGNOSIS — C187 Malignant neoplasm of sigmoid colon: Secondary | ICD-10-CM

## 2010-09-23 DIAGNOSIS — R197 Diarrhea, unspecified: Secondary | ICD-10-CM

## 2010-09-23 MED ORDER — CHOLESTYRAMINE 4 G PO PACK: PACK | ORAL | Status: DC

## 2010-09-23 NOTE — Assessment & Plan Note (Signed)
Random colon biopsies normal on recent colonoscopy. Medications could be contributing to diarrhea (Glucophage and Adderall). Trial of Questran BID, check stool studies. Check celiac studies. She may ultimately need to try holding / switching some of her home medications which are known to cause diarrhea.  Follow up with Dr. Arlyce Dice in 3-4 weeks.

## 2010-09-23 NOTE — Patient Instructions (Signed)
Go to the lab before you in leave today.  It is located in the basement. We have sent a prescription for Questran to [facility].

## 2010-09-23 NOTE — Progress Notes (Signed)
Kelli Cruz [id number] [birth year]   HISTORY OR PRESENT ILLNESS : Kelli Cruz is a 50 year old female known to Dr. Arlyce Dice for personal history of colon cancer for which she is s/p resection 2010. She is currently being worked up by Dr. Arlyce Dice for diarrhea. She had her surveillance colonoscopy last month at which time random colon biopsies were done for evaluation of diarrhea. She has several loose stools, sometimes as many as eight. Diarrhea associated with urgency and pre-defecatory discomfort.  Tried Xifaxin last month but it didn't help and caused nausea and vomiting. Pepto Bismul hasn't helped. Her symptoms have not worsened since last visit but she is just at "wits end". Declares and 18 pound loss in less than 2 months. Last TSH two months ago. No recent antibiotic or out of country travel. Uses well water. Some use of diet products. No excessive caffeine. She takes Adderall 1-2 times a day but diarrhea still occurs if she skips a dose. On Glucophage for a year.   Current Medications, Allergies, Past Medical History, Past Surgical History, Family History and Social History were reviewed in [employer] record.   PHYSICAL EXAMINATION : General: Well developed  female in no acute distress Head: Normocephalic and atraumatic Eyes:  sclerae anicteric,conjunctive pink. Ears: Normal auditory acuity Mouth: No deformity or lesions Neck: Supple, no masses.  Lungs: Clear throughout to auscultation Heart: Regular rate and rhythm; no murmurs heard Abdomen: Soft, nondistended, nontender. No masses or hepatomegaly noted. Normal bowel sounds Rectal: not done Musculoskeletal: Symmetrical with no gross deformities  Skin: No lesions on visible extremities Extremities: No edema or deformities noted Neurological: Alert oriented x 4, grossly nonfocal Cervical Nodes:  No significant cervical adenopathy Psychological:  Alert and cooperative. Normal mood and affect  ASSESSMENT AND PLAN  :

## 2010-09-25 NOTE — Assessment & Plan Note (Signed)
Up to date on surveillance examinations, last one being late July 2012.

## 2010-09-26 NOTE — Progress Notes (Signed)
I AGREE WITH ASSESSMENT AND PLANS 

## 2010-09-30 ENCOUNTER — Other Ambulatory Visit: Payer: BC Managed Care – PPO

## 2010-09-30 DIAGNOSIS — R197 Diarrhea, unspecified: Secondary | ICD-10-CM

## 2010-10-01 LAB — FECAL LACTOFERRIN, QUANT: Lactoferrin: NEGATIVE

## 2010-10-03 ENCOUNTER — Telehealth: Payer: Self-pay | Admitting: *Deleted

## 2010-10-03 LAB — CLOSTRIDIUM DIFFICILE BY PCR: Toxigenic C. Difficile by PCR: NOT DETECTED

## 2010-10-03 NOTE — Telephone Encounter (Signed)
Spoke with patient and gave her the results. Questran stops the diarrhea but then she does not go at all. When she stops the Questran, the diarrhea comes back after several bowel movements.

## 2010-10-03 NOTE — Telephone Encounter (Signed)
Message copied by Daphine Deutscher on Mon Oct 03, 2010 12:42 PM ------      Message from: Meredith Pel      Created: Mon Oct 03, 2010 11:41 AM       Awaiting Cdiff but stool studies otherwise normal. Did Questran help?

## 2010-10-03 NOTE — Telephone Encounter (Signed)
If taking twice daily then decrease to once daily. If constipated with once daily then try taking once every other day. Please make sure she has follow up with [place]. Thanks

## 2010-10-03 NOTE — Telephone Encounter (Signed)
Patient given Willette Cluster, NP recommendations.

## 2010-10-12 ENCOUNTER — Telehealth: Payer: Self-pay | Admitting: *Deleted

## 2010-10-12 NOTE — Telephone Encounter (Signed)
Message copied by Daphine Deutscher on Wed Oct 12, 2010 10:53 AM ------      Message from: Meredith Pel      Created: Tue Oct 11, 2010  7:03 PM       Her stool studies were all negative. Rene Kocher, if we haven't touched base with her since our appt. Please see how she is feeling. If you have already spoken to her then disregard. I think she has a follow up with Dr. Arlyce Dice next week.       Thanks

## 2010-10-12 NOTE — Telephone Encounter (Signed)
Patient given results. She has OV with Dr. Arlyce Dice on 10/21/10 at 3:30 PM

## 2010-10-21 ENCOUNTER — Encounter: Payer: Self-pay | Admitting: Gastroenterology

## 2010-10-21 ENCOUNTER — Ambulatory Visit (INDEPENDENT_AMBULATORY_CARE_PROVIDER_SITE_OTHER): Payer: BC Managed Care – PPO | Admitting: Gastroenterology

## 2010-10-21 ENCOUNTER — Ambulatory Visit: Payer: BC Managed Care – PPO

## 2010-10-21 VITALS — BP 120/70 | HR 78 | Ht 63.0 in | Wt 195.8 lb

## 2010-10-21 DIAGNOSIS — R197 Diarrhea, unspecified: Secondary | ICD-10-CM

## 2010-10-21 MED ORDER — HYOSCYAMINE SULFATE ER 0.375 MG PO TBCR: EXTENDED_RELEASE_TABLET | ORAL | Status: DC

## 2010-10-21 NOTE — Progress Notes (Signed)
History of Present Illness:  Kelli Cruz has returned for followup of her diarrhea. Colonoscopy was negative as were random biopsies of the colon. On cholestyramine if she is having some solid stools although she still has crampy pain and occasional loose stools.    Review of Systems: Pertinent positive and negative review of systems were noted in the above HPI section. All other review of systems were otherwise negative.    Current Medications, Allergies, Past Medical History, Past Surgical History, Family History and Social History were reviewed in [employer] electronic medical record  Vital signs were reviewed in today's medical record. Physical Exam: General: Well developed , well nourished, no acute distress

## 2010-10-21 NOTE — Assessment & Plan Note (Addendum)
Etiology is not exactly certain. I have some suspicion that it could be related to her medicines for diabetes.  Recommendations #1 continued cholestyramine one packet every one to 3 days. #2 trial hyomax 0.375 mg twice a day  If this is unsuccessful we may have to try holding her medications one at a time

## 2010-10-21 NOTE — Patient Instructions (Signed)
Please make a return office visit for 3 weeks with Dr Arlyce Dice.

## 2010-10-24 ENCOUNTER — Ambulatory Visit (INDEPENDENT_AMBULATORY_CARE_PROVIDER_SITE_OTHER): Payer: BC Managed Care – PPO | Admitting: Family

## 2010-10-24 DIAGNOSIS — E538 Deficiency of other specified B group vitamins: Secondary | ICD-10-CM

## 2010-10-24 MED ORDER — CYANOCOBALAMIN 1000 MCG/ML IJ SOLN
1000.0000 ug | Freq: Once | INTRAMUSCULAR | Status: AC
  Administered 2010-10-24: 1000 ug via INTRAMUSCULAR

## 2010-11-13 ENCOUNTER — Other Ambulatory Visit: Payer: Self-pay | Admitting: Nurse Practitioner

## 2010-11-18 ENCOUNTER — Other Ambulatory Visit: Payer: Self-pay | Admitting: *Deleted

## 2010-11-18 MED ORDER — CHOLESTYRAMINE 4 G PO PACK: PACK | ORAL | Status: DC

## 2010-11-18 NOTE — Telephone Encounter (Signed)
Called pharmacy and sent prescription for Kelli Cruz to [facility].. Pt saw Dr Arlyce Dice on 10-25-2010 and he said the pt should continue the Questran 1 packet 1-3 days a week.

## 2010-11-23 ENCOUNTER — Ambulatory Visit (INDEPENDENT_AMBULATORY_CARE_PROVIDER_SITE_OTHER): Payer: BC Managed Care – PPO | Admitting: Family

## 2010-11-23 DIAGNOSIS — E538 Deficiency of other specified B group vitamins: Secondary | ICD-10-CM

## 2010-11-23 LAB — COMPREHENSIVE METABOLIC PANEL
Albumin: 3.9
BUN: 12
Creatinine, Ser: 0.69
Total Bilirubin: 0.9
Total Protein: 7.1

## 2010-11-23 LAB — CBC
HCT: 38.5
Hemoglobin: 10.6 — ABNORMAL LOW
MCHC: 34.5
MCV: 93.4
Platelets: 341
RBC: 3.3 — ABNORMAL LOW
RDW: 13.4

## 2010-11-23 MED ORDER — CYANOCOBALAMIN 1000 MCG/ML IJ SOLN
1000.0000 ug | Freq: Once | INTRAMUSCULAR | Status: AC
  Administered 2010-11-23: 1000 ug via INTRAMUSCULAR

## 2010-11-24 LAB — POCT HEMOGLOBIN-HEMACUE
Hemoglobin: 12.5
Operator id: 116011

## 2010-11-25 ENCOUNTER — Telehealth: Payer: Self-pay | Admitting: Family Medicine

## 2010-11-25 NOTE — Telephone Encounter (Signed)
Patient did not return my call. I believe the issue has already been handled. She called and spoke with [place] earlier. She wanted to know if it was okay to do labs on the same day as her appointment. I advised that it was and [place] scheduled her appointment.

## 2010-11-25 NOTE — Telephone Encounter (Signed)
Returned our call.  She can be called at [phone].  Please call her back.

## 2010-11-25 NOTE — Telephone Encounter (Signed)
Prior to my first documentation, I had note from triage calling patient back yesterday and leaving a message to return the call.  Patient states that she see's Dr. Milinda Antis; however, another PCP listed.  Patient is asking about labwork for appointment on Oct 22nd at 4pm for follow up and if she can have blood drawn on Oct 22nd.  Patient called since and said she had saw where we called and was calling us back.  Please return patient's call again.  Thanks, [employer]

## 2010-11-25 NOTE — Telephone Encounter (Signed)
I can't find where anyone here called her. I called and left a message that if she knew who it was by name that called or if she knew what it was in regards to to call me back. I also suggested that it may have been another office. Will await return call.

## 2010-11-25 NOTE — Telephone Encounter (Signed)
Message left for patient to return my call.  

## 2010-11-29 ENCOUNTER — Ambulatory Visit (INDEPENDENT_AMBULATORY_CARE_PROVIDER_SITE_OTHER): Payer: BC Managed Care – PPO | Admitting: Gastroenterology

## 2010-11-29 ENCOUNTER — Encounter: Payer: Self-pay | Admitting: Gastroenterology

## 2010-11-29 VITALS — BP 100/70 | HR 78 | Ht 63.0 in | Wt 184.0 lb

## 2010-11-29 DIAGNOSIS — R197 Diarrhea, unspecified: Secondary | ICD-10-CM

## 2010-11-29 DIAGNOSIS — C187 Malignant neoplasm of sigmoid colon: Secondary | ICD-10-CM

## 2010-11-29 NOTE — Assessment & Plan Note (Signed)
Plan followup colonoscopy 2015 

## 2010-11-29 NOTE — Patient Instructions (Signed)
Follow up as needed

## 2010-11-29 NOTE — Progress Notes (Signed)
Kelli Cruz has returned for followup of diarrhea. On a regimen of hyoscyamine and cholestyramine her symptoms are quite well controlled.  She currently is without GI complaints.

## 2010-11-30 NOTE — Assessment & Plan Note (Signed)
Well controlled with cholestyramine and hyomax

## 2010-12-05 ENCOUNTER — Encounter: Payer: Self-pay | Admitting: Family Medicine

## 2010-12-05 ENCOUNTER — Ambulatory Visit (INDEPENDENT_AMBULATORY_CARE_PROVIDER_SITE_OTHER): Payer: BC Managed Care – PPO | Admitting: Family Medicine

## 2010-12-05 VITALS — BP 98/64 | HR 84 | Temp 97.9°F | Ht 63.0 in | Wt 189.2 lb

## 2010-12-05 DIAGNOSIS — E039 Hypothyroidism, unspecified: Secondary | ICD-10-CM

## 2010-12-05 DIAGNOSIS — Z23 Encounter for immunization: Secondary | ICD-10-CM

## 2010-12-05 DIAGNOSIS — E538 Deficiency of other specified B group vitamins: Secondary | ICD-10-CM

## 2010-12-05 DIAGNOSIS — E119 Type 2 diabetes mellitus without complications: Secondary | ICD-10-CM

## 2010-12-05 DIAGNOSIS — E785 Hyperlipidemia, unspecified: Secondary | ICD-10-CM

## 2010-12-05 NOTE — Patient Instructions (Addendum)
Labs today including a1c , cholesterol , chemistry and B12 level and will update you and thyroid  We will make a plan for the B12 when labs return  Flu shot today  Pneumonia vaccine today

## 2010-12-05 NOTE — Progress Notes (Signed)
Subjective:    Patient ID: Kelli Cruz, female    DOB: 11-14-1960, 50 y.o.   MRN: [id number]  HPI Here for f/u of B12 def and DM and hyperlipidemia   Was having a lot of diarrhea -- and saw GI and had colonoscopy  Given questran - and that helped , also on hyoscamine   Had a memory test done abd told that it was mood disorder that affects her memory  Is on adderall and alprazolam --off antidepressant , would be more up and down than deal with the side effects  Memory issues are improved    Last B12 inj 10/10 Lives [place] and works in [place] -- new job - Production designer, theatre/television/film in [employer]- is happier  Is hard to get her injection there -- still working on once per month  May have a friend who can do it    Needs level checked   Wt is up 5 lb with bmi of 33  Per her scales has overall lost 20-30 lb from her high weight  Is walking 2 days per week and wants to do more   Lab Results  Component Value Date   TSH 1.79 06/20/2010   Was in nl range No clinical changes  Hair/skin /energy level  DM - last a1c 7.6 opthy On glipizide and metformin Sugars- not checking optimally often enough  In the am - usually runs about 100-120 if she takes her medicine  After eating -- 2 hours -- usually runs 140s-150s   Flu shot today- got her flu shot  Pneumovax  Lipids - on zocor - is fasting today  Lab Results  Component Value Date   CHOL 166 04/05/2010   CHOL 282* 12/13/2009   CHOL 274* 12/09/2009   Lab Results  Component Value Date   HDL 39.10 04/05/2010   HDL 41 12/13/2009   HDL 38.30* 12/11/2008   Lab Results  Component Value Date   LDLCALC 87 04/05/2010   LDLCALC 194* 12/13/2009   Lab Results  Component Value Date   TRIG 199.0* 04/05/2010   TRIG 233* 12/13/2009   TRIG 117.0 12/11/2008   Lab Results  Component Value Date   CHOLHDL 4 04/05/2010   CHOLHDL 6.9 Ratio 12/13/2009   CHOLHDL 5 12/11/2008   Lab Results  Component Value Date   LDLDIRECT 147.2 12/11/2008   LDLDIRECT 121.9 09/07/2008   LDLDIRECT 148.0 06/08/2008   diet - has been eating more but healthier than she was  Not enough fruit -- is staying away from the sugar a bit more  Avoiding red meat and has a tiny bit of fried foods -- once per week   Has done DM teaching in the past and still has materials   Saw her gyn recently - disc her low libido and also vaginal dryness Given low dose vaginal cream - will be trying that   Patient Active Problem List  Diagnoses  . ADENOCARCINOMA, SIGMOID COLON  . HYPOTHYROIDISM  . DIABETES MELLITUS, TYPE II  . HYPERLIPIDEMIA  . ANXIETY  . DEPRESSION  . ALLERGIC RHINITIS, SEASONAL  . FIBROCYSTIC BREAST DISEASE  . MIGRAINES, HX OF  . VITAMIN B12 DEFICIENCY  . Blurred vision  . Speech abnormality  . Diarrhea   Past Medical History  Diagnosis Date  . Anxiety   . Depression   . Diabetes mellitus     type II  . Hypothyroidism   . Endometriosis   . Colon cancer   . Ganglion cyst   .  Bipolar 1 disorder   . Family history of malignant neoplasm of gastrointestinal tract    Past Surgical History  Procedure Date  . Laparoscopy 1997    D & C for endometriosis  . Dilation and curettage of uterus 2006    miscarriage   . Abdominal hysterectomy   . Knee dislocation surgery   . Ganglion cyst excision   . Colon resection June 2010  . Colon surgery   . Colonoscopy    History  Substance Use Topics  . Smoking status: Never Smoker   . Smokeless tobacco: Never Used  . Alcohol Use: 0.0 oz/week    0 drink(s) per week     socially twice a month at most   Family History  Problem Relation Age of Onset  . Breast cancer Mother   . Diabetes Mother   . Coronary artery disease Mother   . Kidney disease Mother     renal insufficiency  . Hyperlipidemia Mother   . Diabetes Father   . Coronary artery disease Father   . Colon cancer Father   . Colon cancer Paternal Grandmother   . Breast cancer Maternal Aunt   . Breast cancer Paternal Aunt     Allergies  Allergen Reactions  . Hydrocodone     REACTION: nausea   Current Outpatient Prescriptions on File Prior to Visit  Medication Sig Dispense Refill  . acetaminophen (TYLENOL) 325 MG tablet Take 650 mg by mouth every 6 (six) hours as needed.        . ALPRAZolam (XANAX) 0.5 MG tablet Take 1-2 by mouth daily as needed      . cholestyramine (QUESTRAN) 4 G packet Mix with juice or water and drink every 1-3 days.  30 each  1  . cyanocobalamin (,VITAMIN B-12,) 1000 MCG/ML injection Inject 1,000 mcg into the muscle every 30 (thirty) days.        . Diabetic Sterile Lancets MISC by Does not apply route. Check glucose two times a day and as needed for out of control DM 250.00       . glipiZIDE (GLUCOTROL XL) 5 MG 24 hr tablet Take 1 tablet (5 mg total) by mouth daily.  90 tablet  3  . glucose blood test strip 1 each by Other route. Check glucose two times a day and as needed for out of control DM 250.00       . Hyoscyamine Sulfate 0.375 MG TBCR Take one tablet twice a day  60 each  1  . levothyroxine (SYNTHROID, LEVOTHROID) 125 MCG tablet Take 1 tablet (125 mcg total) by mouth daily.  90 tablet  3  . metFORMIN (GLUCOPHAGE) 1000 MG tablet Take one tablet by mouth two times daily with a meal  180 tablet  3  . simvastatin (ZOCOR) 20 MG tablet Take 1 tab by mouth at bedtime      . ibuprofen (ADVIL,MOTRIN) 200 MG tablet Take 200 mg by mouth as directed.             Review of Systems Review of Systems  Constitutional: Negative for fever, appetite change,  and unexpected weight change. pos for baseline fatigue  Eyes: Negative for pain and visual disturbance.  Respiratory: Negative for cough and shortness of breath.   Cardiovascular: Negative for cp or palpitations    Gastrointestinal: Negative for nausea, diarrhea and constipation.  Genitourinary: Negative for urgency and frequency.  Skin: Negative for pallor or rash   Neurological: Negative for weakness, light-headedness, numbness and  headaches.  Hematological: Negative for adenopathy. Does not bruise/bleed easily.  Psychiatric/Behavioral: pos for depression and attention problems- overall improved , no SI.          Objective:   Physical Exam  Constitutional: She appears well-developed and well-nourished. No distress.       overwt and well appearing   HENT:  Head: Normocephalic and atraumatic.  Mouth/Throat: Oropharynx is clear and moist.  Eyes: Conjunctivae and EOM are normal. Pupils are equal, round, and reactive to light. No scleral icterus.  Neck: Normal range of motion. Neck supple. No JVD present. No thyromegaly present.  Cardiovascular: Normal rate, regular rhythm, normal heart sounds and intact distal pulses.  Exam reveals no gallop.   No murmur heard. Pulmonary/Chest: Effort normal and breath sounds normal. No respiratory distress. She has no wheezes. She exhibits no tenderness.  Abdominal: Soft. Bowel sounds are normal. She exhibits no distension and no mass. There is no tenderness.  Musculoskeletal: Normal range of motion. She exhibits no edema and no tenderness.  Lymphadenopathy:    She has no cervical adenopathy.  Neurological: She is alert. She has normal reflexes. No cranial nerve deficit. Coordination normal.  Skin: Skin is warm and dry. No rash noted. No erythema. No pallor.  Psychiatric: She has a normal mood and affect.       Seems less depressed Still some psychomotor slowing  Nl eye contact and communication skills today          Assessment & Plan:

## 2010-12-06 LAB — COMPREHENSIVE METABOLIC PANEL
ALT: 25 U/L (ref 0–35)
AST: 26 U/L (ref 0–37)
Chloride: 106 mEq/L (ref 96–112)
Creatinine, Ser: 0.6 mg/dL (ref 0.4–1.2)
Total Bilirubin: 0.4 mg/dL (ref 0.3–1.2)

## 2010-12-06 LAB — LIPID PANEL
HDL: 41.3 mg/dL (ref >39.00)
LDL Cholesterol: 71 mg/dL (ref 0–99)
Total CHOL/HDL Ratio: 3
VLDL: 26.6 mg/dL (ref 0.0–40.0)

## 2010-12-06 LAB — HEMOGLOBIN A1C: Hgb A1c MFr Bld: 6 % (ref 4.6–6.5)

## 2010-12-08 ENCOUNTER — Telehealth: Payer: Self-pay | Admitting: Family Medicine

## 2010-12-08 MED ORDER — LEVOTHYROXINE SODIUM 112 MCG PO TABS: 112.0000 ug | ORAL_TABLET | Freq: Every day | ORAL | Status: DC

## 2010-12-08 NOTE — Assessment & Plan Note (Signed)
Check a1c [place] for improvement Disc low glycemic diet- more motivated now Also needs exercise and wt loss

## 2010-12-08 NOTE — Assessment & Plan Note (Signed)
Has not been optimal - but avoiding inc in zocor because of warnings about it  Rev last lab with pt  Lab today  Rev low sat fat diet in detail

## 2010-12-08 NOTE — Assessment & Plan Note (Signed)
Last tsh theraputic without clinical changes  Will re check tsh at pt request - she does stay chronically tired - likely multifactorial

## 2010-12-08 NOTE — Assessment & Plan Note (Signed)
Check level Difficult for her to get inj in [place] B12 today

## 2010-12-08 NOTE — Telephone Encounter (Signed)
Need to decrease thyroid dose - re check tsh in 6 weeks please Px written for call in   Chol and sugar improved- good job! B12 is rising very slowly - needs to continue monthly injections here or outside office- let me know how she needs to do it

## 2010-12-09 NOTE — Telephone Encounter (Signed)
Patient notified as instructed by telephone. Medication phoned to [facility] [address] pharmacy as instructed.  Lab appt scheduled as instructed 01/25/11 at 4pm. Pt wants to go to [facility] near where she works to get B12 injections. Pt will call back after talking with [facility] to ck their policy on giving injections and if pt needs rx she will let you know at that time.

## 2010-12-09 NOTE — Telephone Encounter (Signed)
That sounds good, thanks

## 2010-12-19 ENCOUNTER — Other Ambulatory Visit: Payer: Self-pay | Admitting: Gastroenterology

## 2011-01-17 ENCOUNTER — Other Ambulatory Visit: Payer: Self-pay | Admitting: Family Medicine

## 2011-01-17 DIAGNOSIS — E039 Hypothyroidism, unspecified: Secondary | ICD-10-CM

## 2011-01-18 ENCOUNTER — Telehealth: Payer: Self-pay | Admitting: Internal Medicine

## 2011-01-18 NOTE — Telephone Encounter (Signed)
Re schedule labs for 2 weeks later please  Here is order for the B12 shots in IN box

## 2011-01-18 NOTE — Telephone Encounter (Signed)
Patient called and stated she would like to get her B12 shots done at [facility] since she is working in [place] it will be closer and wanted to know if we could submit a order to them.  Attention Noreene Larsson fax: [phone].  Also that she is supposed to come in on the 12th of December and have labs done and wanted to know since she hasn't received her B12 shot in a month should she rescedule her labs.

## 2011-01-25 ENCOUNTER — Ambulatory Visit (INDEPENDENT_AMBULATORY_CARE_PROVIDER_SITE_OTHER): Payer: BC Managed Care – PPO | Admitting: *Deleted

## 2011-01-25 ENCOUNTER — Other Ambulatory Visit (INDEPENDENT_AMBULATORY_CARE_PROVIDER_SITE_OTHER): Payer: BC Managed Care – PPO

## 2011-01-25 DIAGNOSIS — E039 Hypothyroidism, unspecified: Secondary | ICD-10-CM

## 2011-01-25 DIAGNOSIS — E538 Deficiency of other specified B group vitamins: Secondary | ICD-10-CM

## 2011-01-25 MED ORDER — CYANOCOBALAMIN 1000 MCG/ML IJ SOLN: 1000.0000 ug | Freq: Once | INTRAMUSCULAR | Status: DC

## 2011-01-25 NOTE — Telephone Encounter (Signed)
Patient notified as instructed by telephone. Pt is getting labs today because she is in town and will pick up order at front desk for Vit B 12.

## 2011-01-25 NOTE — Progress Notes (Signed)
B12 injection given during nurse visit today. 

## 2011-01-26 ENCOUNTER — Telehealth: Payer: Self-pay | Admitting: Family Medicine

## 2011-01-26 MED ORDER — LEVOTHYROXINE SODIUM 88 MCG PO TABS: 88.0000 ug | ORAL_TABLET | Freq: Every day | ORAL | Status: DC

## 2011-01-26 NOTE — Telephone Encounter (Signed)
I need to decrease thyroid dose again , tsh is persistantly low  Px written for call in   Re check tsh and free T4 in 1 mo for hypothyroid please

## 2011-01-27 NOTE — Telephone Encounter (Signed)
Left v/m at all contact #s for pt to call back.

## 2011-01-31 NOTE — Telephone Encounter (Signed)
Patient notified as instructed by telephone. Pt has already picked up med and started. Lab appt scheduled as instructed 03/07/10 at 4pm..

## 2011-03-03 ENCOUNTER — Other Ambulatory Visit: Payer: Self-pay | Admitting: Family Medicine

## 2011-03-03 DIAGNOSIS — E039 Hypothyroidism, unspecified: Secondary | ICD-10-CM

## 2011-03-08 ENCOUNTER — Other Ambulatory Visit: Payer: BC Managed Care – PPO

## 2011-03-31 ENCOUNTER — Other Ambulatory Visit (INDEPENDENT_AMBULATORY_CARE_PROVIDER_SITE_OTHER): Payer: BC Managed Care – PPO

## 2011-03-31 ENCOUNTER — Ambulatory Visit (INDEPENDENT_AMBULATORY_CARE_PROVIDER_SITE_OTHER): Payer: BC Managed Care – PPO

## 2011-03-31 DIAGNOSIS — E538 Deficiency of other specified B group vitamins: Secondary | ICD-10-CM

## 2011-03-31 DIAGNOSIS — E039 Hypothyroidism, unspecified: Secondary | ICD-10-CM

## 2011-03-31 MED ORDER — CYANOCOBALAMIN 1000 MCG/ML IJ SOLN
1000.0000 ug | Freq: Once | INTRAMUSCULAR | Status: AC
  Administered 2011-03-31: 1000 ug via INTRAMUSCULAR

## 2011-04-03 ENCOUNTER — Other Ambulatory Visit: Payer: Self-pay

## 2011-04-03 MED ORDER — LEVOTHYROXINE SODIUM 88 MCG PO TABS: 88.0000 ug | ORAL_TABLET | Freq: Every day | ORAL | Status: DC

## 2011-04-03 NOTE — Telephone Encounter (Signed)
[facility] [place] request for 90 day supply Levothyroxine 88 mcg #90 x 3.

## 2011-04-04 ENCOUNTER — Other Ambulatory Visit: Payer: Self-pay | Admitting: *Deleted

## 2011-04-04 MED ORDER — LEVOTHYROXINE SODIUM 88 MCG PO TABS: 88.0000 ug | ORAL_TABLET | Freq: Every day | ORAL | Status: DC

## 2011-05-01 ENCOUNTER — Other Ambulatory Visit: Payer: Self-pay

## 2011-05-01 ENCOUNTER — Emergency Department (HOSPITAL_COMMUNITY)
Admission: EM | Admit: 2011-05-01 | Discharge: 2011-05-01 | Disposition: A | Discharge status: Home / Self Care | Payer: No Typology Code available for payment source | Attending: Emergency Medicine | Admitting: Emergency Medicine

## 2011-05-01 ENCOUNTER — Emergency Department (HOSPITAL_COMMUNITY): Payer: No Typology Code available for payment source

## 2011-05-01 ENCOUNTER — Encounter (HOSPITAL_COMMUNITY): Payer: Self-pay

## 2011-05-01 DIAGNOSIS — M549 Dorsalgia, unspecified: Secondary | ICD-10-CM | POA: Insufficient documentation

## 2011-05-01 DIAGNOSIS — IMO0002 Reserved for concepts with insufficient information to code with codable children: Secondary | ICD-10-CM | POA: Insufficient documentation

## 2011-05-01 DIAGNOSIS — S22009A Unspecified fracture of unspecified thoracic vertebra, initial encounter for closed fracture: Secondary | ICD-10-CM | POA: Insufficient documentation

## 2011-05-01 DIAGNOSIS — Y92410 Unspecified street and highway as the place of occurrence of the external cause: Secondary | ICD-10-CM | POA: Insufficient documentation

## 2011-05-01 DIAGNOSIS — S60811A Abrasion of right wrist, initial encounter: Secondary | ICD-10-CM

## 2011-05-01 DIAGNOSIS — E119 Type 2 diabetes mellitus without complications: Secondary | ICD-10-CM | POA: Insufficient documentation

## 2011-05-01 DIAGNOSIS — Z79899 Other long term (current) drug therapy: Secondary | ICD-10-CM | POA: Insufficient documentation

## 2011-05-01 DIAGNOSIS — E039 Hypothyroidism, unspecified: Secondary | ICD-10-CM | POA: Insufficient documentation

## 2011-05-01 DIAGNOSIS — T07XXXA Unspecified multiple injuries, initial encounter: Secondary | ICD-10-CM | POA: Insufficient documentation

## 2011-05-01 DIAGNOSIS — S22000A Wedge compression fracture of unspecified thoracic vertebra, initial encounter for closed fracture: Secondary | ICD-10-CM

## 2011-05-01 DIAGNOSIS — R079 Chest pain, unspecified: Secondary | ICD-10-CM | POA: Insufficient documentation

## 2011-05-01 DIAGNOSIS — M542 Cervicalgia: Secondary | ICD-10-CM | POA: Insufficient documentation

## 2011-05-01 DIAGNOSIS — M25579 Pain in unspecified ankle and joints of unspecified foot: Secondary | ICD-10-CM | POA: Insufficient documentation

## 2011-05-01 DIAGNOSIS — R51 Headache: Secondary | ICD-10-CM | POA: Insufficient documentation

## 2011-05-01 LAB — URINALYSIS, ROUTINE W REFLEX MICROSCOPIC
Glucose, UA: NEGATIVE mg/dL
Hgb urine dipstick: NEGATIVE
Leukocytes, UA: NEGATIVE
Specific Gravity, Urine: >1.046 — ABNORMAL HIGH (ref 1.005–1.030)
Urobilinogen, UA: 0.2 mg/dL (ref 0.0–1.0)

## 2011-05-01 MED ORDER — OXYCODONE-ACETAMINOPHEN 5-325 MG PO TABS: 1.0000 | ORAL_TABLET | ORAL | Status: AC | PRN

## 2011-05-01 MED ORDER — ONDANSETRON HCL 4 MG/2ML IJ SOLN
4.0000 mg | Freq: Once | INTRAMUSCULAR | Status: AC
  Administered 2011-05-01: 4 mg via INTRAVENOUS
  Filled 2011-05-01: qty 2

## 2011-05-01 MED ORDER — IOHEXOL 300 MG/ML  SOLN
80.0000 mL | Freq: Once | INTRAMUSCULAR | Status: AC | PRN
  Administered 2011-05-01: 80 mL via INTRAVENOUS

## 2011-05-01 MED ORDER — FENTANYL CITRATE 0.05 MG/ML IJ SOLN
100.0000 ug | Freq: Once | INTRAMUSCULAR | Status: AC
  Administered 2011-05-01: 100 ug via INTRAVENOUS
  Filled 2011-05-01: qty 2

## 2011-05-01 NOTE — ED Notes (Signed)
Pt medicated for pain as ordered. Will monitor 

## 2011-05-01 NOTE — ED Notes (Addendum)
Dr. Effie Shy cleared the patient from the backboard

## 2011-05-01 NOTE — ED Notes (Signed)
C-collar was discontinued by Dr. Effie Shy

## 2011-05-01 NOTE — ED Notes (Addendum)
Pt is c/o neck, back pain, will notify the ERMD
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
## 2011-05-01 NOTE — ED Notes (Signed)
Pt able to ambulate on own but complained of 9/10 back pain that made it "hard to breath"

## 2011-05-10 ENCOUNTER — Telehealth: Payer: Self-pay | Admitting: Gastroenterology

## 2011-05-10 NOTE — Telephone Encounter (Signed)
Okay. Have her take fiber daily and call back early next week to see where she is with her symptoms.

## 2011-05-10 NOTE — Telephone Encounter (Signed)
Pt took a few percocet last week for pain.

## 2011-05-10 NOTE — Telephone Encounter (Signed)
Left message for pt to call back  °

## 2011-05-10 NOTE — Telephone Encounter (Signed)
Is she taking narcotics for pain?  She may have overflow diarrhea. For now I would not take any medications. She should call back early in the week if symptoms persist.

## 2011-05-10 NOTE — Telephone Encounter (Signed)
Spoke with pt and she is aware.
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
## 2011-06-19 ENCOUNTER — Other Ambulatory Visit: Payer: Self-pay | Admitting: *Deleted

## 2011-06-19 MED ORDER — GLIPIZIDE ER 5 MG PO TB24: 5.0000 mg | ORAL_TABLET | Freq: Every day | ORAL | Status: DC

## 2011-06-19 NOTE — Telephone Encounter (Signed)
Received faxed refill request from pharmacy. Refill sent to pharmacy electronically. 

## 2011-07-19 ENCOUNTER — Ambulatory Visit (HOSPITAL_COMMUNITY): Payer: No Typology Code available for payment source | Admitting: Physical Therapy

## 2011-07-25 ENCOUNTER — Ambulatory Visit (HOSPITAL_COMMUNITY)
Admission: RE | Admit: 2011-07-25 | Discharge: 2011-07-25 | Disposition: A | Discharge status: Home / Self Care | Payer: BC Managed Care – PPO | Source: Ambulatory Visit | Attending: Orthopedic Surgery | Admitting: Orthopedic Surgery

## 2011-07-25 DIAGNOSIS — M542 Cervicalgia: Secondary | ICD-10-CM | POA: Insufficient documentation

## 2011-07-25 DIAGNOSIS — M546 Pain in thoracic spine: Secondary | ICD-10-CM | POA: Insufficient documentation

## 2011-07-25 DIAGNOSIS — E785 Hyperlipidemia, unspecified: Secondary | ICD-10-CM | POA: Insufficient documentation

## 2011-07-25 DIAGNOSIS — IMO0001 Reserved for inherently not codable concepts without codable children: Secondary | ICD-10-CM | POA: Insufficient documentation

## 2011-07-25 DIAGNOSIS — M545 Low back pain, unspecified: Secondary | ICD-10-CM | POA: Insufficient documentation

## 2011-07-25 DIAGNOSIS — R262 Difficulty in walking, not elsewhere classified: Secondary | ICD-10-CM | POA: Insufficient documentation

## 2011-07-25 DIAGNOSIS — E119 Type 2 diabetes mellitus without complications: Secondary | ICD-10-CM | POA: Insufficient documentation

## 2011-07-26 DIAGNOSIS — M546 Pain in thoracic spine: Secondary | ICD-10-CM | POA: Insufficient documentation

## 2011-07-26 DIAGNOSIS — M545 Low back pain, unspecified: Secondary | ICD-10-CM | POA: Insufficient documentation

## 2011-07-26 DIAGNOSIS — M791 Myalgia, unspecified site: Secondary | ICD-10-CM | POA: Insufficient documentation

## 2011-07-26 DIAGNOSIS — R293 Abnormal posture: Secondary | ICD-10-CM | POA: Insufficient documentation

## 2011-07-26 DIAGNOSIS — M542 Cervicalgia: Secondary | ICD-10-CM | POA: Insufficient documentation
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
## 2011-08-01 ENCOUNTER — Ambulatory Visit (HOSPITAL_COMMUNITY)
Admission: RE | Admit: 2011-08-01 | Discharge: 2011-08-01 | Disposition: A | Discharge status: Home / Self Care | Payer: BC Managed Care – PPO | Source: Ambulatory Visit | Attending: Family Medicine | Admitting: Family Medicine

## 2011-08-01 NOTE — Progress Notes (Signed)
Physical Therapy Treatment Patient Details  Name: Kelli Cruz MRN: [id number] Date of Birth: 02-Oct-1960  Today's Date: 08/01/2011 Time: 7829-5621 PT Time Calculation (min): 52 min  Visit#: 2  of 12   Re-eval: 08/24/11  Charge: self care 15 min therex 23 min Manual 12 min  Subjective: Symptoms/Limitations Symptoms: Pt stated compliance with HEP, stated increased numbness while walking dog and trying to complete the HEP though.  Pain increased while trying to sit tall and breath deep.  The more I walk the more pressure I can feel on lower back. Pain Assessment Currently in Pain?: Yes Pain Score:  (ranges from 3-8/10) Pain Location: Back (Cervical to lumbar) Pain Orientation: Right;Left;Upper;Mid;Lower  Objective:   Exercise/Treatments Standing Exercises Other Standing Exercises: Corner elbow presses 3x10 sec holds cueing to initiate lower traps prior mid to reduce UT activation Other Standing Exercises: quad thoracic stretch 1x 30" Seated Exercises Postural Training: Chin Tuck w/scap retraction x10 sec holds and diaphragmatic breathing , HEP Other Seated Exercise: seated diaphragmatic breathing with hand placements for deep breathing Supine Exercises Other Supine Exercise: SKTC too painful at hip flexion 90 degrees Prone Exercises Other Prone Exercise: NMR for multifidus 5x 5" with 1 pilllow below hips  Manual Therapy Manual Therapy: Massage Massage: STM to thoracic area, L/R Ribs x 15 min to reduce spasms  Physical Therapy Assessment and Plan PT Assessment and Plan Clinical Impression Statement: Session focus on education for musculature purpose/activation, diaphragmatic breathing, and positioning for pain relief.  Pt required demonstration and tactile cueing for proper mm activation.  Pt was able to demonstrate proper technique getting in and out of bed for LBP.  Pt stated increased radiculat pain with SKTC at 90 degrees hip flexion, unable to hold stretch.  Pt  hypersensititive in thoracic region as well as rib cage with STM.  Pt stated pain reduced to 5/10 following massage. PT Plan: NO MODALITIES SECONDARY TO HX OF CANCER.  Reduce pain with use of gentle manual techniques  Continue with breathing exercises to improve rib mobility,.  Begin prone press ups next session.    Goals    Problem List Patient Active Problem List  Diagnosis  . ADENOCARCINOMA, SIGMOID COLON  . HYPOTHYROIDISM  . DIABETES MELLITUS, TYPE II  . HYPERLIPIDEMIA  . ANXIETY  . DEPRESSION  . ALLERGIC RHINITIS, SEASONAL  . FIBROCYSTIC BREAST DISEASE  . MIGRAINES, HX OF  . VITAMIN B12 DEFICIENCY  . Blurred vision  . Speech abnormality  . Diarrhea  . Cervical pain  . Pain in thoracic spine  . Lumbar pain  . Muscle pain  . Poor posture    PT - End of Session Activity Tolerance: Patient tolerated treatment well;Patient limited by pain General Behavior During Session: [facility] for tasks performed Cognition: [facility] for tasks performed  GP No functional reporting required  Juel Burrow, PTA 08/01/2011, 6:50 PM

## 2011-08-03 ENCOUNTER — Inpatient Hospital Stay (HOSPITAL_COMMUNITY)
Admission: RE | Admit: 2011-08-03 | Discharge: 2011-08-03 | Payer: No Typology Code available for payment source | Source: Ambulatory Visit

## 2011-08-03 NOTE — Progress Notes (Signed)
Physical Therapy Treatment Patient Details  Name: Kelli Cruz MRN: [id number] Date of Birth: 06-21-1960  Today's Date: 08/03/2011 Time: 1740-1830 PT Time Calculation (min): 50 min  Visit#: 3  of 12   Re-eval: 08/24/11  Charge: manual 38 min therex 12 min  Subjective: Symptoms/Limitations Symptoms: Had busy day at work, been up and down all day long.  Pain increases throughout the day, pain scale R big toe 7/10 feels almost numb, LBP 6/10. Pain Assessment Currently in Pain?: Yes Pain Score:   6 Pain Location: Back (Cervical to lumbar)  Objective:   Exercise/Treatments Seated Exercises UBE 4' no resistance backwards for postureOther Seated Exercise: seated diaphragmatic breathing with hand placements for deep breathing Other Seated Exercise: shoulder rolls 5 x Prone Exercises Other Prone Exercise: POE x 2 min  Physical Therapy Assessment and Plan PT Assessment and Plan Clinical Impression Statement: Session focus on spasm reduction with diaphragmatic breathing.  Pt hypersensitive initially with STM but able to reduce spams and increase pressure over time.  Pt stated pain reduced at end of session with improved posture and gait noted. PT Plan: NO MODALITIES SECONDARY TO HX OF CANCER. Reduce pain with use of gentle manual techniques Continue with breathing exercises to improve rib mobility,. Begin prone press ups next session.    Goals    Problem List Patient Active Problem List  Diagnosis  . ADENOCARCINOMA, SIGMOID COLON  . HYPOTHYROIDISM  . DIABETES MELLITUS, TYPE II  . HYPERLIPIDEMIA  . ANXIETY  . DEPRESSION  . ALLERGIC RHINITIS, SEASONAL  . FIBROCYSTIC BREAST DISEASE  . MIGRAINES, HX OF  . VITAMIN B12 DEFICIENCY  . Blurred vision  . Speech abnormality  . Diarrhea  . Cervical pain  . Pain in thoracic spine  . Lumbar pain  . Muscle pain  . Poor posture    PT - End of Session Activity Tolerance: Patient tolerated treatment well;Patient limited by  pain General Behavior During Session: [facility] for tasks performed Cognition: [facility] for tasks performed  GP No functional reporting required  Juel Burrow, PTA 08/03/2011, 7:16 PM

## 2011-08-07 ENCOUNTER — Ambulatory Visit (HOSPITAL_COMMUNITY)
Admission: RE | Admit: 2011-08-07 | Discharge: 2011-08-07 | Disposition: A | Discharge status: Home / Self Care | Payer: BC Managed Care – PPO | Source: Ambulatory Visit | Attending: Orthopedic Surgery | Admitting: Orthopedic Surgery

## 2011-08-07 NOTE — Progress Notes (Signed)
Physical Therapy Treatment Patient Details  Name: Kelli Cruz MRN: [id number] Date of Birth: [birth year]  Today's Date: 08/07/2011 Time: 1655-1740 PT Time Calculation (min): 45 min Visit#: 4  of 12   Re-eval: 08/24/11 Charges:  therex 23', massage 20'    Subjective: Symptoms/Limitations Symptoms: Pt. states it feels much better today than last visit.  3/10 in neck and scapular regions, 2/10 in lumbar area. Pain Assessment Currently in Pain?: Yes   Exercise/Treatments Machines for Strengthening UBE (Upper Arm Bike): 4'backward, 1.5 resistance Theraband Exercises Scapula Retraction: 10 reps;Red Shoulder Extension: 10 reps;Red Rows: 10 reps;Red Standing Exercises Other Standing Exercises: Corner elbow presses 10X5 sec holds cueing to initiate lower traps prior mid to reduce UT activation Other Standing Exercises: seated mid back stretch 2X30" Seated Exercises Shoulder Flexion: Both;10 reps Prone Exercises Other Prone Exercise: POE x 2 min   Manual Therapy Manual Therapy: Massage Massage: STM to thoracic and scapular regions x 20 min to reduce spasms while in prone  Physical Therapy Assessment and Plan PT Assessment and Plan Clinical Impression Statement: Pt. hypersensitive initially to [facility] but able to reduce sensitivity and spasms.  Noted tightness in B thoracic paraspinals and L mid trap region.  Added postural 3 exercises with theraband to strengthen postural muscles.  Pt. expressed interest in learning exercises to do with physioball. PT Plan: Progress core stability and postural strength, incorporate use of physioball.  NO MODALITIES SECONDARY TO HX OF CANCER. Reduce pain with use of gentle manual techniques Continue with breathing exercises to improve rib mobility,. Begin prone press ups next session.     Problem List Patient Active Problem List  Diagnosis  . ADENOCARCINOMA, SIGMOID COLON  . HYPOTHYROIDISM  . DIABETES MELLITUS, TYPE II  . HYPERLIPIDEMIA  .  ANXIETY  . DEPRESSION  . ALLERGIC RHINITIS, SEASONAL  . FIBROCYSTIC BREAST DISEASE  . MIGRAINES, HX OF  . VITAMIN B12 DEFICIENCY  . Blurred vision  . Speech abnormality  . Diarrhea  . Cervical pain  . Pain in thoracic spine  . Lumbar pain  . Muscle pain  . Poor posture    PT - End of Session Activity Tolerance: Patient tolerated treatment well General Behavior During Session: [facility] for tasks performed Cognition: [facility] for tasks performed  GP No functional reporting required  Lurena Nida, PTA/CLT 08/07/2011, 5:46 PM

## 2011-08-09 ENCOUNTER — Ambulatory Visit (HOSPITAL_COMMUNITY): Payer: No Typology Code available for payment source | Admitting: Physical Therapy

## 2011-08-10 ENCOUNTER — Ambulatory Visit (HOSPITAL_COMMUNITY)
Admission: RE | Admit: 2011-08-10 | Discharge: 2011-08-10 | Disposition: A | Discharge status: Home / Self Care | Payer: BC Managed Care – PPO | Source: Ambulatory Visit | Attending: Orthopedic Surgery | Admitting: Orthopedic Surgery

## 2011-08-10 NOTE — Progress Notes (Signed)
Physical Therapy Treatment Patient Details  Name: Kelli Cruz MRN: [id number] Date of Birth: February 07, 1961  Today's Date: 08/10/2011 Time: 4098-1191 PT Time Calculation (min): 43 min Visit#: 5  of 12   Re-eval: 08/24/11 Charges:  therex 23', manual 16'    Subjective: Symptoms/Limitations Symptoms: Pt. states her LBP is 3/10 but everywhere else is "much worse" but unalble to give a pain number.  Pt. also reports pain in her jaws/gums and states she made an appt. with her primary physician for tomorrow regarding this.   Exercise/Treatments Machines for Strengthening UBE (Upper Arm Bike): 4'backward, 1.5 resistance Theraband Exercises Scapula Retraction: 10 reps;Green Shoulder Extension: 10 reps;Green Rows: 10 reps;Green Standing Exercises Other Standing Exercises: seated mid back stretch 3X30" Seated Exercises Shoulder Flexion: 10 reps Other Seated Exercise: shoulder rolls 10 x Prone Exercises Other Prone Exercise: POE x 2 min   Manual Therapy Manual Therapy: Massage Massage: STM to thoracic and scapular regions x 20 min to reduce spasms while in prone   Physical Therapy Assessment and Plan PT Assessment and Plan Clinical Impression Statement: Pt with less sensitivity to STM with noted overall reduction of spasms/general tightness.  Pt. able to tolerate POE better without spasm and improved relaxation, however states relief does not last long after therapy session.   PT Plan: Progress core stability; add stretches with physioball and prone press ups next session.  NO MODALITIES SECONDARY TO HX OF CANCER.     Problem List Patient Active Problem List  Diagnosis  . ADENOCARCINOMA, SIGMOID COLON  . HYPOTHYROIDISM  . DIABETES MELLITUS, TYPE II  . HYPERLIPIDEMIA  . ANXIETY  . DEPRESSION  . ALLERGIC RHINITIS, SEASONAL  . FIBROCYSTIC BREAST DISEASE  . MIGRAINES, HX OF  . VITAMIN B12 DEFICIENCY  . Blurred vision  . Speech abnormality  . Diarrhea  . Cervical pain  .  Pain in thoracic spine  . Lumbar pain  . Muscle pain  . Poor posture    Lurena Nida, PTA/CLT 08/10/2011, 5:46 PM

## 2011-08-11 ENCOUNTER — Encounter: Payer: Self-pay | Admitting: Family Medicine

## 2011-08-11 ENCOUNTER — Ambulatory Visit (INDEPENDENT_AMBULATORY_CARE_PROVIDER_SITE_OTHER): Payer: No Typology Code available for payment source | Admitting: Family Medicine

## 2011-08-11 VITALS — BP 108/80 | HR 78 | Temp 98.3°F | Wt 191.8 lb

## 2011-08-11 DIAGNOSIS — E538 Deficiency of other specified B group vitamins: Secondary | ICD-10-CM

## 2011-08-11 DIAGNOSIS — M26629 Arthralgia of temporomandibular joint, unspecified side: Secondary | ICD-10-CM

## 2011-08-11 MED ORDER — CYANOCOBALAMIN 1000 MCG/ML IJ SOLN
1000.0000 ug | Freq: Once | INTRAMUSCULAR | Status: AC
  Administered 2011-08-11: 1000 ug via INTRAMUSCULAR

## 2011-08-11 NOTE — Patient Instructions (Addendum)
Use sensodyne toothpaste for the tooth sensitivity.  Drink plenty of water for the dry mouth.  For the TMJ pain- ice bag, limit chewing (esp tough meat) and take ibuprofen with food.  You may need to talk to your dentist about the pain; you may need a mouth guard.

## 2011-08-11 NOTE — Progress Notes (Signed)
B upper jaw pain.  Aching more yesterday.  Lips were dry and had a stinging sensation in mouth.  Pain opening the jaw, R>L.  Sx had been going on intermittently for a few months.  Worse recently.  Spicy foods make the mouth stinging worse.  Mouth is dry.  Teeth are more sensitive to temp changes.    In PT now for T11 fx.    Meds, vitals, and allergies reviewed.   ROS: See HPI.  Otherwise, noncontributory.  nad ncat Tm wnl R TMJ ttp Sinuses not ttp Mildly dry mouth No OP erythema Neck supple No LA rrr ctab

## 2011-08-14 ENCOUNTER — Ambulatory Visit (HOSPITAL_COMMUNITY)
Admission: RE | Admit: 2011-08-14 | Discharge: 2011-08-14 | Disposition: A | Discharge status: Home / Self Care | Payer: BC Managed Care – PPO | Source: Ambulatory Visit | Attending: Orthopedic Surgery | Admitting: Orthopedic Surgery

## 2011-08-14 DIAGNOSIS — M545 Low back pain, unspecified: Secondary | ICD-10-CM | POA: Insufficient documentation

## 2011-08-14 DIAGNOSIS — IMO0001 Reserved for inherently not codable concepts without codable children: Secondary | ICD-10-CM | POA: Insufficient documentation

## 2011-08-14 DIAGNOSIS — M542 Cervicalgia: Secondary | ICD-10-CM | POA: Insufficient documentation

## 2011-08-14 DIAGNOSIS — E119 Type 2 diabetes mellitus without complications: Secondary | ICD-10-CM | POA: Insufficient documentation

## 2011-08-14 DIAGNOSIS — E785 Hyperlipidemia, unspecified: Secondary | ICD-10-CM | POA: Insufficient documentation

## 2011-08-14 DIAGNOSIS — M26629 Arthralgia of temporomandibular joint, unspecified side: Secondary | ICD-10-CM | POA: Insufficient documentation

## 2011-08-14 DIAGNOSIS — R262 Difficulty in walking, not elsewhere classified: Secondary | ICD-10-CM | POA: Insufficient documentation

## 2011-08-14 DIAGNOSIS — M546 Pain in thoracic spine: Secondary | ICD-10-CM | POA: Insufficient documentation

## 2011-08-14 NOTE — Assessment & Plan Note (Signed)
Use sensodyne toothpaste for the tooth sensitivity.  Drink plenty of water for the dry mouth.  For the TMJ pain- ice bag, limit chewing (esp tough meat) and take ibuprofen with food.  You may need to talk to your dentist about the pain; you may need a mouth guard.  

## 2011-08-14 NOTE — Progress Notes (Signed)
Physical Therapy Treatment Patient Details  Name: Kelli Cruz MRN: [id number] Date of Birth: [birth year]  Today's Date: 08/14/2011 Time: 4098-1191 PT Time Calculation (min): 24 min Charges: 14' TE, 10' manual  Visit#: 6  of 12   Re-eval: 08/24/11   Subjective: Symptoms/Limitations Symptoms: Pt reports that her MD told her to take ibuprofen for all of her pain, but it did not help.  She states they told her she may have TMJD.   Pain Assessment Currently in Pain?: Yes Pain Score:   3 Pain Location: Back  Precautions/Restrictions     Exercise/Treatments Theraband Exercises Scapula Retraction: 10 reps;Blue Shoulder Extension: 10 reps;Blue Rows: 10 reps;Blue Shoulder ADduction: 10 reps;Blue (BUE) Standing Exercises Other Standing Exercises: Corner elbow presses 10X10 sec holds   Manual Therapy Manual Therapy: Other (comment) Myofascial Release: to ears and B SCM  Other Manual Therapy: cervical distraction x10 minutes at end of treatment.  had increased tingling to BUE  Physical Therapy Assessment and Plan PT Assessment and Plan Clinical Impression Statement: continues to be impaired with numbness and tingling to B UE, even with cervical distraction.  Reports some toe numbness with adduction exercises.  unable to palpate significant muscle spasms.   PT Plan: Progress core stability; add stretches with physioball and prone press ups next session.  NO MODALITIES SECONDARY TO HX OF CANCER.    Goals    Problem List Patient Active Problem List  Diagnosis  . ADENOCARCINOMA, SIGMOID COLON  . HYPOTHYROIDISM  . DIABETES MELLITUS, TYPE II  . HYPERLIPIDEMIA  . ANXIETY  . DEPRESSION  . ALLERGIC RHINITIS, SEASONAL  . FIBROCYSTIC BREAST DISEASE  . MIGRAINES, HX OF  . VITAMIN B12 DEFICIENCY  . Blurred vision  . Speech abnormality  . Diarrhea  . Cervical pain  . Pain in thoracic spine  . Lumbar pain  . Muscle pain  . Poor posture  . TMJ arthralgia       GP    Kaion Tisdale 08/14/2011, 5:30 PM

## 2011-08-16 ENCOUNTER — Ambulatory Visit (HOSPITAL_COMMUNITY)
Admission: RE | Admit: 2011-08-16 | Discharge: 2011-08-16 | Disposition: A | Discharge status: Home / Self Care | Payer: BC Managed Care – PPO | Source: Ambulatory Visit | Attending: Physical Therapy | Admitting: Physical Therapy

## 2011-08-16 NOTE — Progress Notes (Deleted)
Physical Therapy Treatment Patient Details  Name: Kelli Cruz MRN: [id number] Date of Birth: [birth year]  Today's Date: 08/16/2011 Time: 7829-5621 PT Time Calculation (min): 40 min Charges: 25' TE, 15' manual  Visit#: 7  of 12   Re-eval: 08/24/11   Subjective: Symptoms/Limitations Symptoms: I am still hurting all over, but I do have less tingling into BUE.   Pain Assessment Currently in Pain?: Yes Pain Location: Back  Exercise/Treatments Theraband Exercises Scapula Retraction: 15 reps;Blue Shoulder Extension: Blue;15 reps Rows: 15 reps;Green Shoulder ADduction: 15 reps;Blue (BUE) Prone Exercises Other Prone Exercise: POE: cervical rotation R and L, PNF R and L, serratus anterior: x15 each Standing Functional Squats: 10 reps Other Standing Lumbar Exercises: Lumbar flexion x10, lumbar extension x10, R side sag x10 ( with relief), L side sage x10  Physical Therapy Assessment and Plan PT Assessment and Plan Clinical Impression Statement: Pt has greatest relief with R sided sagging into wall.  continues to have increased radicular symptoms with cervical distraction.  posture has improved, cervical ROM continues to be slow and guarded.  PT Plan: Progress core stability; add stretches with physioball and prone press ups next session.  NO MODALITIES SECONDARY TO HX OF CANCER.    Goals    Problem List Patient Active Problem List  Diagnosis  . ADENOCARCINOMA, SIGMOID COLON  . HYPOTHYROIDISM  . DIABETES MELLITUS, TYPE II  . HYPERLIPIDEMIA  . ANXIETY  . DEPRESSION  . ALLERGIC RHINITIS, SEASONAL  . FIBROCYSTIC BREAST DISEASE  . MIGRAINES, HX OF  . VITAMIN B12 DEFICIENCY  . Blurred vision  . Speech abnormality  . Diarrhea  . Cervical pain  . Pain in thoracic spine  . Lumbar pain  . Muscle pain  . Poor posture  . TMJ arthralgia       GP    Caidin Heidenreich 08/16/2011, 6:00 PM

## 2011-08-16 NOTE — Progress Notes (Signed)
Physical Therapy Treatment Patient Details  Name: Kelli Cruz MRN: [id number] Date of Birth: [birth year]  Today's Date: 08/16/2011 Time: 7829-5621 PT Time Calculation (min): 40 min Charges: 25' Manual, 15' TE Visit#: 7  of 12   Re-eval: 08/24/11   Subjective: Symptoms/Limitations Symptoms: I am still hurting all over, but I do have less tingling into BUE.   Pain Assessment Currently in Pain?: Yes Pain Location: Back  Exercise/Treatments Theraband Exercises Scapula Retraction: 15 reps;Blue Shoulder Extension: Blue;15 reps Rows: 15 reps;Green Shoulder ADduction: 15 reps;Blue (BUE) Prone Exercises Other Prone Exercise: POE: cervical rotation R and L, PNF R and L, serratus anterior: x15 each Standing Functional Squats: 10 reps Other Standing Lumbar Exercises: Lumbar flexion x10, lumbar extension x10, R side sag x10 ( with relief), L side sage x10  Manual Therapy Manual Therapy: Joint mobilization Joint Mobilization: Grade I-II R Ribs 7-10 PA mobs to decrease pain, Grade I to C7 SP  Other Manual Therapy: cervical distraction x10 minutes at end of treatment. had increased tingling to BUE and BLE into toe  Physical Therapy Assessment and Plan PT Assessment and Plan Clinical Impression Statement: Pt has greatest relief with R sided sagging into wall.  continues to have increased radicular symptoms with cervical distraction.  posture has improved, cervical ROM continues to be slow and guarded.  PT Plan: Progress core stability; add stretches with physioball and prone press ups next session.  NO MODALITIES SECONDARY TO HX OF CANCER.    Goals    Problem List Patient Active Problem List  Diagnosis  . ADENOCARCINOMA, SIGMOID COLON  . HYPOTHYROIDISM  . DIABETES MELLITUS, TYPE II  . HYPERLIPIDEMIA  . ANXIETY  . DEPRESSION  . ALLERGIC RHINITIS, SEASONAL  . FIBROCYSTIC BREAST DISEASE  . MIGRAINES, HX OF  . VITAMIN B12 DEFICIENCY  . Blurred vision  . Speech abnormality  .  Diarrhea  . Cervical pain  . Pain in thoracic spine  . Lumbar pain  . Muscle pain  . Poor posture  . TMJ arthralgia       GP    Shirleen Mcfaul 08/16/2011, 6:03 PM

## 2011-08-21 ENCOUNTER — Ambulatory Visit (HOSPITAL_COMMUNITY)
Admission: RE | Admit: 2011-08-21 | Discharge: 2011-08-21 | Disposition: A | Discharge status: Home / Self Care | Payer: BC Managed Care – PPO | Source: Ambulatory Visit | Attending: Physical Therapy | Admitting: Physical Therapy

## 2011-08-21 ENCOUNTER — Ambulatory Visit (HOSPITAL_COMMUNITY): Payer: No Typology Code available for payment source | Admitting: Physical Therapy

## 2011-08-21 NOTE — Progress Notes (Signed)
Physical Therapy Treatment Patient Details  Name: Kelli Cruz MRN: [id number] Date of Birth: Jul 01, 1960  Today's Date: 08/21/2011 Time: 1655 (started by PTA (RS))-1753 PT Time Calculation (min): 58 min Charges: 25' TE, 25' Manual, 8' Self Care Visit#: 8  of 12   Re-eval: 08/28/11    Subjective: Symptoms/Limitations Symptoms: I didn't do much yesterday and I got very stiff. Pain Assessment Currently in Pain?: Yes Pain Score:   4 Pain Location: Back Pain Orientation: Right;Left;Upper;Mid;Lower  Precautions/Restrictions     Exercise/Treatments Theraband Exercises Scapula Retraction: 15 reps;Blue Shoulder Extension: 15 reps;Blue Rows: 15 reps;Blue Shoulder ADduction: 15 reps;Blue Standing Exercises Other Standing Exercises: Corner elbow presses 10X10 sec holds  Prone Exercises Shoulder Extension: 10 reps (palm down) Rows: 10 reps Other Prone Exercise: Shoulder Abduction w/thumbs down x10 Other Prone Exercise: POE: cervical rotation R and L, serratus anterior: x15 each  Manual Therapy Manual Therapy: Joint mobilization Joint Mobilization: Grade II to T1-T3, reports increased tenderness and radicular symtoms.   Other Manual Therapy: Cervical Distraction.TP release to R subscapular region.  Nerve glides to R and L shoulder region. Subocciptal release.  Manual x25'    Physical Therapy Assessment and Plan PT Assessment and Plan Clinical Impression Statement: Pt continues to describe radiating symptoms into her forearms and toes with cervical distraction.  had increased radicular symptoms into RUE when pressing on subscapular TP.  Does not have any fascial restrcitons or muscular spasms to cervical region, however with palpation reports increased pain (especially to subocciptal region) with greatest pain when pressing on L subocciptal region produces "pressure" symptoms to R side. Pt may likely have symptoms associated with TOS.  PT Plan: Continue to address possible TOS  symtoms.  Continue with prone exercises to improve core strength.  Re-eval w/[facility] on 7/15.  NO MODALITIES SECONDARY TO HX OF CANCER.    Goals    Problem List Patient Active Problem List  Diagnosis  . ADENOCARCINOMA, SIGMOID COLON  . HYPOTHYROIDISM  . DIABETES MELLITUS, TYPE II  . HYPERLIPIDEMIA  . ANXIETY  . DEPRESSION  . ALLERGIC RHINITIS, SEASONAL  . FIBROCYSTIC BREAST DISEASE  . MIGRAINES, HX OF  . VITAMIN B12 DEFICIENCY  . Blurred vision  . Speech abnormality  . Diarrhea  . Cervical pain  . Pain in thoracic spine  . Lumbar pain  . Muscle pain  . Poor posture  . TMJ arthralgia    PT Plan of Care PT Patient Instructions: Discussed with patient TOS symtoms and POC moving forward.  Pt asked if she should recieve a second opinion from another MD due to her increaed radiculopathy.  Consulted and Agree with Plan of Care: Patient  GP    Brenten Janney 08/21/2011, 6:03 PM

## 2011-08-23 ENCOUNTER — Ambulatory Visit (HOSPITAL_COMMUNITY): Payer: No Typology Code available for payment source | Admitting: Physical Therapy

## 2011-08-24 ENCOUNTER — Ambulatory Visit (HOSPITAL_COMMUNITY): Payer: No Typology Code available for payment source | Admitting: Physical Therapy

## 2011-08-28 ENCOUNTER — Ambulatory Visit (HOSPITAL_COMMUNITY)
Admission: RE | Admit: 2011-08-28 | Discharge: 2011-08-28 | Disposition: A | Discharge status: Home / Self Care | Payer: BC Managed Care – PPO | Source: Ambulatory Visit | Attending: Physical Therapy | Admitting: Physical Therapy

## 2011-08-28 NOTE — Evaluation (Signed)
Physical Therapy Discharge Summary  Patient Details  Name: Kelli Cruz MRN: [id number] Date of Birth: 21-Mar-1960  Today's Date: 08/28/2011 Time: 1702-1740 PT Time Calculation (min): 38 min Charges 1 ROM, Self Care x32'  Visit#: 9  of 12   Re-eval:   Assessment Diagnosis: Thoracic and Cervical - Lumbar pain Surgical Date: 05/01/11 Next MD Visit: Dr. Lajoyce Corners - unscheduleded   Past Medical History:  Past Medical History  Diagnosis Date  . Anxiety   . Depression   . Diabetes mellitus     type II  . Hypothyroidism   . Endometriosis   . Colon cancer   . Ganglion cyst   . Bipolar 1 disorder   . Family history of malignant neoplasm of gastrointestinal tract    Past Surgical History:  Past Surgical History  Procedure Date  . Laparoscopy 1997    D & C for endometriosis  . Dilation and curettage of uterus 2006    miscarriage   . Abdominal hysterectomy   . Knee dislocation surgery   . Ganglion cyst excision   . Colon resection June 2010  . Colon surgery   . Colonoscopy   . Hernia repair     Subjective Symptoms/Limitations Symptoms: My pain is not as bad, but I still have a lot of tingling into both of my arms and into my legs.  Pain Assessment Currently in Pain?: Yes Pain Score:   3  Assessment Cervical Strength Overall Cervical Strength Comments: Improved ROM and coordination to shoulder and cervical region.  Significant improvement in postural endurance. Lumbar AROM Lumbar Flexion: WNL -  (decreased 30%) Lumbar Extension: WNL -  moderate pain Lumbar - Right Side Bend: decreased 25% (decreased 25%) Lumbar - Left Side Bend: decreased 25% - increased pain (was decreased 40%) Lumbar - Right Rotation: decreased 25% (was decreased 50%) Lumbar - Left Rotation: WNL - moderate pain (was decreased 50%) Palpation Palpation: without muscular spams to cervical-lumbar region.  mild decreased PA mobility to C7-T1 SP  Exercise/Treatments Education only on continuing HEP and  f/u w/MD.    Physical Therapy Assessment and Plan PT Assessment and Plan Clinical Impression Statement: re-eval complete and d/c summary sent to MD.  Ms. Cedotal has attended 9 OP PT visits to address cervical-lumbar pain w/the following findings: she has improved her lumbar and cervical ROM and posture, however continues to have significant limiations with her radicular pain into her BUE and her RLE>LLE.  I remain concerned about increasing radiculopathy with daily activities and increases with distraction techniques.  Recommend f/u w/Dr. Lajoyce Corners for further testing.  Will D/C patient w/home HEP.  PT Plan: D/C w/home HEP    Goals Home Exercise Program Pt will Perform Home Exercise Program: Independently PT Goal: Perform Home Exercise Program - Progress: Met PT Short Term Goals Time to Complete Short Term Goals: 2 weeks PT Short Term Goal 1: Pt will report pain less than 3/10 for 50% of her day.  PT Short Term Goal 1 - Progress: Met (average 3/10) PT Short Term Goal 2: Pt will present with decreased fascial restrictions.  PT Short Term Goal 2 - Progress: Met PT Short Term Goal 3: Pt will present with appropriate posture during an entire therapy regime.  PT Short Term Goal 3 - Progress: Met PT Short Term Goal 4: Pt will improve LE strength by 1 muscle grade. PT Short Term Goal 4 - Progress: Met PT Short Term Goal 5: Pt will improve core coordination and demonstrate independent activation of TrA  and multifidus.  PT Short Term Goal 5 - Progress: Met PT Long Term Goals Time to Complete Long Term Goals: 4 weeks PT Long Term Goal 1: Pt will report pain less than 2/10 for 75% of her day for improved QOL. PT Long Term Goal 1 - Progress: Progressing toward goal PT Long Term Goal 2: Pt will report less than or equal to a 50% on her ODI for improved percieved functional ability.  PT Long Term Goal 2 - Progress: Met Long Term Goal 3: Pt will improve LE strength to WNL in order to tolerate walking for  greater than 30 minutes to return to exercise routine.  Long Term Goal 3 Progress: Met (reoports she is walking more and faster, inc. radiculopathy) Long Term Goal 4: Pt will improve her core coordination in order to tolerate standing for greater than 1 hour to complete household work.  Long Term Goal 4 Progress: Not met (continued radicular pain. )  Problem List Patient Active Problem List  Diagnosis  . ADENOCARCINOMA, SIGMOID COLON  . HYPOTHYROIDISM  . DIABETES MELLITUS, TYPE II  . HYPERLIPIDEMIA  . ANXIETY  . DEPRESSION  . ALLERGIC RHINITIS, SEASONAL  . FIBROCYSTIC BREAST DISEASE  . MIGRAINES, HX OF  . VITAMIN B12 DEFICIENCY  . Blurred vision  . Speech abnormality  . Diarrhea  . Cervical pain  . Pain in thoracic spine  . Lumbar pain  . Muscle pain  . Poor posture  . TMJ arthralgia    PT Plan of Care PT Patient Instructions: Discussed HEP and continueing with core stabilization exercises to help with controlling radicular pain.  Consulted and Agree with Plan of Care: Patient  GP    Mikaeel Petrow 08/28/2011, 5:42 PM  Physician Documentation Your signature is required to indicate approval of the treatment plan as stated above.  Please sign and either send electronically or make a copy of this report for your files and return this physician signed original.   Please mark one 1.__approve of plan  2. ___approve of plan with the following conditions.   ______________________________                                                          _____________________ Physician Signature                                                                                                             Date

## 2011-10-02 ENCOUNTER — Telehealth: Payer: Self-pay | Admitting: Family Medicine

## 2011-10-02 NOTE — Telephone Encounter (Signed)
I'm fine with doing her labs the day I see her for her PE- just as long as she does not eat very fatty foods/ junk food the day of the visit  If she prefers to send a lab order- I will write one- just let me know

## 2011-10-02 NOTE — Telephone Encounter (Signed)
Pt is needing a CPE for med refill's. She was wondering if she could have a late day because she lives in [place] and she was wondering if her lab work could be sent over to a place near her work so she wouldn't have to drive so far away twice in a row.  Fax number for lab work to be sent over///[facility] : [phone]

## 2011-10-03 NOTE — Telephone Encounter (Signed)
Left message on patient vm about her getting labs done on the same day as instructed.

## 2011-10-05 ENCOUNTER — Telehealth: Payer: Self-pay
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
## 2011-10-06 NOTE — Telephone Encounter (Signed)
I put order in IN box on px to fax We will disc numbness at her visit

## 2011-10-06 NOTE — Telephone Encounter (Signed)
Lab order faxed to [facility] [phone].

## 2011-10-09 ENCOUNTER — Other Ambulatory Visit: Payer: Self-pay | Admitting: *Deleted

## 2011-10-09 MED ORDER — METFORMIN HCL 1000 MG PO TABS: ORAL_TABLET | ORAL | Status: DC

## 2011-10-11 ENCOUNTER — Other Ambulatory Visit: Payer: Self-pay

## 2011-10-17 ENCOUNTER — Other Ambulatory Visit: Payer: Self-pay | Admitting: *Deleted

## 2011-10-17 MED ORDER — SIMVASTATIN 20 MG PO TABS: 20.0000 mg | ORAL_TABLET | Freq: Every day | ORAL | Status: DC

## 2011-10-24 ENCOUNTER — Ambulatory Visit (INDEPENDENT_AMBULATORY_CARE_PROVIDER_SITE_OTHER): Payer: BC Managed Care – PPO | Admitting: Family Medicine

## 2011-10-24 ENCOUNTER — Encounter: Payer: Self-pay | Admitting: Family Medicine

## 2011-10-24 VITALS — BP 117/64 | HR 80 | Temp 97.9°F | Ht 63.0 in | Wt 193.2 lb

## 2011-10-24 DIAGNOSIS — E538 Deficiency of other specified B group vitamins: Secondary | ICD-10-CM

## 2011-10-24 DIAGNOSIS — E039 Hypothyroidism, unspecified: Secondary | ICD-10-CM

## 2011-10-24 DIAGNOSIS — Z23 Encounter for immunization: Secondary | ICD-10-CM

## 2011-10-24 DIAGNOSIS — E119 Type 2 diabetes mellitus without complications: Secondary | ICD-10-CM

## 2011-10-24 DIAGNOSIS — E785 Hyperlipidemia, unspecified: Secondary | ICD-10-CM

## 2011-10-24 DIAGNOSIS — R200 Anesthesia of skin: Secondary | ICD-10-CM | POA: Insufficient documentation

## 2011-10-24 DIAGNOSIS — Z0000 Encounter for general adult medical examination without abnormal findings: Secondary | ICD-10-CM

## 2011-10-24 DIAGNOSIS — R209 Unspecified disturbances of skin sensation: Secondary | ICD-10-CM

## 2011-10-24 MED ORDER — CYANOCOBALAMIN 1000 MCG/ML IJ SOLN
1000.0000 ug | Freq: Once | INTRAMUSCULAR | Status: AC
  Administered 2011-10-24: 1000 ug via INTRAMUSCULAR

## 2011-10-24 MED ORDER — GLIPIZIDE ER 5 MG PO TB24: 5.0000 mg | ORAL_TABLET | Freq: Every day | ORAL | Status: DC

## 2011-10-24 MED ORDER — SIMVASTATIN 20 MG PO TABS: 20.0000 mg | ORAL_TABLET | Freq: Every day | ORAL | Status: DC

## 2011-10-24 MED ORDER — METFORMIN HCL 1000 MG PO TABS: ORAL_TABLET | ORAL | Status: DC
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
## 2011-10-24 NOTE — Assessment & Plan Note (Signed)
a1c today Has been very well controlled with glipizide and metformin (she does get diarrhea from metformin-would love to decrease that in future if possible)  Will see how labs look Disc need for low glycemic diet and wt loss

## 2011-10-24 NOTE — Assessment & Plan Note (Signed)
B12 shot today- has not been able to get shots as regularly as planned in [place] where she lives Disc the imp of compliance of this  Also check level with lab today -- ? If could relate to some of the numbness she is having

## 2011-10-24 NOTE — Assessment & Plan Note (Signed)
tsh today Has been clinically stable - feels pretty good  Will advise from there

## 2011-10-24 NOTE — Patient Instructions (Addendum)
B12 shot today Flu shot today  Please call GI and see when colonoscopy is due- thanks  Labs today  Keep working on healthy habits - diet and exercise We will do a neurology consult at check out
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
## 2011-10-24 NOTE — Assessment & Plan Note (Signed)
Labs today Has been fairly controlled on statin and diet  Rev low sat fat diet

## 2011-10-24 NOTE — Assessment & Plan Note (Signed)
Reviewed health habits including diet and exercise and skin cancer prevention Also reviewed health mt list, fam hx and immunizations  Labs today for wellness Lifestyle habits improved since her depression has improved

## 2011-10-25 LAB — CBC WITH DIFFERENTIAL/PLATELET
Basophils Relative: 0.2 % (ref 0.0–3.0)
Eosinophils Relative: 2 % (ref 0.0–5.0)
HCT: 38.8 % (ref 36.0–46.0)
MCV: 93.5 fl (ref 78.0–100.0)
Monocytes Absolute: 0.3 10*3/uL (ref 0.1–1.0)
Monocytes Relative: 3.3 % (ref 3.0–12.0)
Neutrophils Relative %: 65.4 % (ref 43.0–77.0)
RBC: 4.15 Mil/uL (ref 3.87–5.11)
WBC: 8 10*3/uL (ref 4.5–10.5)

## 2011-10-25 LAB — HEMOGLOBIN A1C: Hgb A1c MFr Bld: 6 % (ref 4.6–6.5)

## 2011-10-25 LAB — TSH: TSH: 1.51 u[IU]/mL (ref 0.35–5.50)

## 2011-10-25 LAB — VITAMIN B12: Vitamin B-12: >1500 pg/mL — ABNORMAL HIGH (ref 211–911)

## 2011-10-26 LAB — COMPREHENSIVE METABOLIC PANEL
Albumin: 4.1 g/dL (ref 3.5–5.2)
Alkaline Phosphatase: 56 U/L (ref 39–117)
BUN: 17 mg/dL (ref 6–23)
CO2: 27 mEq/L (ref 19–32)
Calcium: 9.3 mg/dL (ref 8.4–10.5)
GFR: 97.79 mL/min (ref >60.00)
Glucose, Bld: 70 mg/dL (ref 70–99)
Potassium: 4.1 mEq/L (ref 3.5–5.1)
Sodium: 139 mEq/L (ref 135–145)
Total Protein: 7.5 g/dL (ref 6.0–8.3)

## 2011-10-26 LAB — LIPID PANEL: Total CHOL/HDL Ratio: 4

## 2012-02-05 ENCOUNTER — Other Ambulatory Visit: Payer: Self-pay | Admitting: *Deleted

## 2012-02-05 MED ORDER — SIMVASTATIN 20 MG PO TABS: 20.0000 mg | ORAL_TABLET | Freq: Every day | ORAL | Status: DC

## 2012-05-27 ENCOUNTER — Other Ambulatory Visit: Payer: Self-pay | Admitting: *Deleted

## 2012-05-27 MED ORDER — GLIPIZIDE ER 5 MG PO TB24: 5.0000 mg | ORAL_TABLET | Freq: Every day | ORAL | Status: DC

## 2012-06-25 ENCOUNTER — Other Ambulatory Visit: Payer: Self-pay | Admitting: Family Medicine

## 2012-10-11 ENCOUNTER — Other Ambulatory Visit: Payer: Self-pay | Admitting: Family Medicine

## 2012-10-16 ENCOUNTER — Encounter: Payer: Self-pay | Admitting: Family Medicine

## 2012-10-16 ENCOUNTER — Ambulatory Visit (INDEPENDENT_AMBULATORY_CARE_PROVIDER_SITE_OTHER): Payer: BC Managed Care – PPO | Admitting: Family Medicine

## 2012-10-16 VITALS — BP 110/80 | HR 76 | Temp 98.2°F | Ht 63.0 in | Wt 205.0 lb

## 2012-10-16 DIAGNOSIS — E119 Type 2 diabetes mellitus without complications: Secondary | ICD-10-CM

## 2012-10-16 DIAGNOSIS — E785 Hyperlipidemia, unspecified: Secondary | ICD-10-CM

## 2012-10-16 DIAGNOSIS — Z23 Encounter for immunization: Secondary | ICD-10-CM

## 2012-10-16 DIAGNOSIS — E039 Hypothyroidism, unspecified: Secondary | ICD-10-CM

## 2012-10-16 MED ORDER — GLIPIZIDE ER 5 MG PO TB24: 5.0000 mg | ORAL_TABLET | Freq: Every day | ORAL | Status: DC

## 2012-10-16 MED ORDER — METFORMIN HCL 1000 MG PO TABS: ORAL_TABLET | ORAL | Status: DC

## 2012-10-16 MED ORDER — SIMVASTATIN 20 MG PO TABS: 20.0000 mg | ORAL_TABLET | Freq: Every day | ORAL | Status: DC

## 2012-10-16 MED ORDER — LEVOTHYROXINE SODIUM 88 MCG PO TABS: ORAL_TABLET | ORAL | Status: DC
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
## 2012-10-16 NOTE — Patient Instructions (Addendum)
Schedule fasting labs in about 2 weeks please  Flu shot today Take care of yourself and keep exercising  Eat a healthy diet and keep working on weight loss  Don't forget to get your mammogram in October

## 2012-10-17 NOTE — Assessment & Plan Note (Signed)
Per pt home glucose is good No low sugars Working on exercise and wt loss/ counseled on low glycemic diet  Rev imms/ meds /opthy and foot exam is nl

## 2012-10-17 NOTE — Assessment & Plan Note (Signed)
Pt out of medicine-will get back on current dose and then check tsh 2 wk later  No clinical changes

## 2012-10-17 NOTE — Assessment & Plan Note (Signed)
Lipid panel ordered fasting with 2 wk labs Rev low sat fat diet Rev goals ofr lipids

## 2012-10-30 ENCOUNTER — Other Ambulatory Visit: Payer: BC Managed Care – PPO

## 2012-11-25 ENCOUNTER — Telehealth: Payer: Self-pay | Admitting: *Deleted

## 2012-11-25 NOTE — Telephone Encounter (Signed)
That is fine 

## 2012-11-25 NOTE — Telephone Encounter (Signed)
Left voicemail requesting pt to call office to scheduled lab/ nurse visit for b12 inj.

## 2012-11-25 NOTE — Telephone Encounter (Signed)
Pt called because she is about to scheduled her f/u lab appt but wanted to know if she can get a b12 shot, no recent b12 shot on chart last one was 10/2011, is it okay to schedule b12 nurse visit, please advise

## 2012-11-26 NOTE — Telephone Encounter (Signed)
Pt scheduled appts with front desk

## 2012-11-28 ENCOUNTER — Other Ambulatory Visit (INDEPENDENT_AMBULATORY_CARE_PROVIDER_SITE_OTHER): Payer: BC Managed Care – PPO

## 2012-11-28 ENCOUNTER — Ambulatory Visit (INDEPENDENT_AMBULATORY_CARE_PROVIDER_SITE_OTHER): Payer: BC Managed Care – PPO | Admitting: *Deleted

## 2012-11-28 DIAGNOSIS — E538 Deficiency of other specified B group vitamins: Secondary | ICD-10-CM

## 2012-11-28 DIAGNOSIS — IMO0001 Reserved for inherently not codable concepts without codable children: Secondary | ICD-10-CM

## 2012-11-28 DIAGNOSIS — R209 Unspecified disturbances of skin sensation: Secondary | ICD-10-CM

## 2012-11-28 DIAGNOSIS — E119 Type 2 diabetes mellitus without complications: Secondary | ICD-10-CM

## 2012-11-28 DIAGNOSIS — M791 Myalgia, unspecified site: Secondary | ICD-10-CM

## 2012-11-28 DIAGNOSIS — Z87898 Personal history of other specified conditions: Secondary | ICD-10-CM

## 2012-11-28 DIAGNOSIS — E039 Hypothyroidism, unspecified: Secondary | ICD-10-CM

## 2012-11-28 DIAGNOSIS — R200 Anesthesia of skin: Secondary | ICD-10-CM

## 2012-11-28 DIAGNOSIS — E785 Hyperlipidemia, unspecified: Secondary | ICD-10-CM

## 2012-11-28 LAB — COMPREHENSIVE METABOLIC PANEL
ALT: 22 U/L (ref 0–35)
AST: 31 U/L (ref 0–37)
BUN: 12 mg/dL (ref 6–23)
CO2: 28 mEq/L (ref 19–32)
Calcium: 9.2 mg/dL (ref 8.4–10.5)
Chloride: 103 mEq/L (ref 96–112)
Creatinine, Ser: 0.8 mg/dL (ref 0.4–1.2)
GFR: 80.71 mL/min (ref >60.00)
Glucose, Bld: 94 mg/dL (ref 70–99)
Sodium: 141 mEq/L (ref 135–145)

## 2012-11-28 LAB — CBC WITH DIFFERENTIAL/PLATELET
Basophils Relative: 0.7 % (ref 0.0–3.0)
Eosinophils Absolute: 0.2 10*3/uL (ref 0.0–0.7)
Eosinophils Relative: 3.6 % (ref 0.0–5.0)
Hemoglobin: 12.5 g/dL (ref 12.0–15.0)
Lymphocytes Relative: 26.3 % (ref 12.0–46.0)
MCHC: 33.7 g/dL (ref 30.0–36.0)
MCV: 90.7 fl (ref 78.0–100.0)
Monocytes Absolute: 0.2 10*3/uL (ref 0.1–1.0)
Neutro Abs: 4.5 10*3/uL (ref 1.4–7.7)
RBC: 4.08 Mil/uL (ref 3.87–5.11)
WBC: 6.9 10*3/uL (ref 4.5–10.5)

## 2012-11-28 LAB — LIPID PANEL
Cholesterol: 134 mg/dL (ref 0–200)
LDL Cholesterol: 70 mg/dL (ref 0–99)
Total CHOL/HDL Ratio: 3
Triglycerides: 117 mg/dL (ref 0.0–149.0)

## 2012-11-28 LAB — HEMOGLOBIN A1C: Hgb A1c MFr Bld: 6.4 % (ref 4.6–6.5)

## 2012-11-28 LAB — MICROALBUMIN / CREATININE URINE RATIO
Creatinine,U: 130.2 mg/dL
Microalb Creat Ratio: 0.2 mg/g (ref 0.0–30.0)
Microalb, Ur: 0.3 mg/dL (ref 0.0–1.9)

## 2012-11-28 MED ORDER — CYANOCOBALAMIN 1000 MCG/ML IJ SOLN
1000.0000 ug | Freq: Once | INTRAMUSCULAR | Status: AC
  Administered 2012-11-28: 1000 ug via INTRAMUSCULAR

## 2012-11-29 ENCOUNTER — Encounter: Payer: Self-pay | Admitting: *Deleted

## 2012-12-11 HISTORY — PX: INTERSTIM IMPLANT PLACEMENT: SHX5130

## 2013-02-10 ENCOUNTER — Emergency Department (HOSPITAL_COMMUNITY): Payer: BC Managed Care – PPO

## 2013-02-10 ENCOUNTER — Encounter (HOSPITAL_COMMUNITY): Payer: Self-pay | Admitting: Emergency Medicine

## 2013-02-10 ENCOUNTER — Emergency Department (HOSPITAL_COMMUNITY)
Admission: EM | Admit: 2013-02-10 | Discharge: 2013-02-11 | Disposition: A | Discharge status: Home / Self Care | Payer: BC Managed Care – PPO | Attending: Emergency Medicine | Admitting: Emergency Medicine

## 2013-02-10 DIAGNOSIS — R0600 Dyspnea, unspecified: Secondary | ICD-10-CM

## 2013-02-10 DIAGNOSIS — Z85038 Personal history of other malignant neoplasm of large intestine: Secondary | ICD-10-CM | POA: Insufficient documentation

## 2013-02-10 DIAGNOSIS — R0609 Other forms of dyspnea: Secondary | ICD-10-CM | POA: Insufficient documentation

## 2013-02-10 DIAGNOSIS — Z8742 Personal history of other diseases of the female genital tract: Secondary | ICD-10-CM | POA: Insufficient documentation

## 2013-02-10 DIAGNOSIS — E119 Type 2 diabetes mellitus without complications: Secondary | ICD-10-CM | POA: Insufficient documentation

## 2013-02-10 DIAGNOSIS — F411 Generalized anxiety disorder: Secondary | ICD-10-CM | POA: Insufficient documentation

## 2013-02-10 DIAGNOSIS — Z79899 Other long term (current) drug therapy: Secondary | ICD-10-CM | POA: Insufficient documentation

## 2013-02-10 DIAGNOSIS — Z8739 Personal history of other diseases of the musculoskeletal system and connective tissue: Secondary | ICD-10-CM | POA: Insufficient documentation

## 2013-02-10 DIAGNOSIS — E039 Hypothyroidism, unspecified: Secondary | ICD-10-CM | POA: Insufficient documentation

## 2013-02-10 DIAGNOSIS — F319 Bipolar disorder, unspecified: Secondary | ICD-10-CM | POA: Insufficient documentation

## 2013-02-10 DIAGNOSIS — R0989 Other specified symptoms and signs involving the circulatory and respiratory systems: Secondary | ICD-10-CM | POA: Insufficient documentation

## 2013-02-10 NOTE — ED Notes (Signed)
Patient states she was lying down to go to sleep and became short of breath and states that she is having difficulty swallowing.

## 2013-02-10 NOTE — ED Provider Notes (Signed)
CSN: [id number]     Arrival date & time 02/10/13  2319 History  This chart was scribed for Joya Gaskins, MD by Karle Plumber, ED Scribe. This patient was seen in room APA11/APA11 and the patient's care was started at 11:42 PM.    Chief Complaint  Patient presents with  . Shortness of Breath   Patient is a 52 y.o. female presenting with shortness of breath. The history is provided by the patient. No language interpreter was used.  Shortness of Breath Associated symptoms: no chest pain, no fever, no rash, no sore throat and no vomiting    HPI Comments:  Kelli Cruz is a 52 y.o. female who presents to the Emergency Department complaining of shortness of breath. She states she went to lay down in the bed to go to sleep and stated she started having a hard time breathing. She reports associated difficulty swallowing. She denies fever, vomiting, diarrhea, CP, back pain, rash, pain with swallowing, or leg swelling.  She reports she has never had this before Nothing improves her symptoms   Past Medical History  Diagnosis Date  . Anxiety   . Depression   . Diabetes mellitus     type II  . Hypothyroidism   . Endometriosis   . Colon cancer   . Ganglion cyst   . Bipolar 1 disorder   . Family history of malignant neoplasm of gastrointestinal tract    Past Surgical History  Procedure Laterality Date  . Laparoscopy  1997    D & C for endometriosis  . Dilation and curettage of uterus  2006    miscarriage   . Abdominal hysterectomy    . Knee dislocation surgery    . Ganglion cyst excision    . Colon resection  June 2010  . Colon surgery    . Colonoscopy    . Hernia repair    . Abdominal hysterectomy     Family History  Problem Relation Age of Onset  . Breast cancer Mother   . Diabetes Mother   . Coronary artery disease Mother   . Kidney disease Mother     renal insufficiency  . Hyperlipidemia Mother   . Diabetes Father   . Coronary artery disease Father   . Colon  cancer Father   . Colon cancer Paternal Grandmother   . Breast cancer Maternal Aunt   . Breast cancer Paternal Aunt    History  Substance Use Topics  . Smoking status: Passive Smoke Exposure - Never Smoker  . Smokeless tobacco: Never Used  . Alcohol Use: 0.0 oz/week    0 drink(s) per week     Comment: socially twice a month at most   OB History   Grav Para Term Preterm Abortions TAB SAB Ect Mult Living                 Review of Systems  Constitutional: Negative for fever.  HENT: Positive for trouble swallowing. Negative for drooling and sore throat.   Respiratory: Positive for shortness of breath.   Cardiovascular: Negative for chest pain and leg swelling.  Gastrointestinal: Negative for vomiting.  Skin: Negative for rash.  Neurological: Negative for syncope and weakness.  Psychiatric/Behavioral: The patient is nervous/anxious.   All other systems reviewed and are negative.    Allergies  Hydrocodone and Milk-related compounds  Home Medications   Current Outpatient Rx  Name  Route  Sig  Dispense  Refill  . acetaminophen (TYLENOL) 325 MG tablet  Oral   Take 650 mg by mouth every 6 (six) hours as needed. For pain         . ALPRAZolam (XANAX) 0.5 MG tablet   Oral   Take 0.5 mg by mouth at bedtime as needed. For anxiety         . amphetamine-dextroamphetamine (ADDERALL XR) 30 MG 24 hr capsule   Oral   Take 30 mg by mouth every morning.         . Diabetic Sterile Lancets MISC   Does not apply   by Does not apply route. Check glucose two times a day and as needed for out of control DM 250.00          . ESTRADIOL VA   Vaginal   Place vaginally 2 (two) times a week.           . gabapentin (NEURONTIN) 300 MG capsule   Oral   Take 300 mg by mouth 2 (two) times daily.         Marland Kitchen glipiZIDE (GLUCOTROL XL) 5 MG 24 hr tablet   Oral   Take 1 tablet (5 mg total) by mouth daily.   90 tablet   3   . glucose blood test strip   Other   1 each by Other  route. Check glucose two times a day and as needed for out of control DM 250.00          . ibuprofen (ADVIL,MOTRIN) 200 MG tablet   Oral   Take 400 mg by mouth as directed. For pain         . levothyroxine (SYNTHROID, LEVOTHROID) 88 MCG tablet      TAKE 1 TABLET BY MOUTH DAILY.   90 tablet   3   . metFORMIN (GLUCOPHAGE) 1000 MG tablet      Take one tablet by mouth two times daily with a meal   180 tablet   3   . Mirabegron (MYRBETRIQ PO)      Take one by mouth daily, pt unsure of strength         . simvastatin (ZOCOR) 20 MG tablet   Oral   Take 1 tablet (20 mg total) by mouth at bedtime. Take 1 tab by mouth at bedtime   90 tablet   3   . VITAMIN B1-B12 IJ   Injection   Inject 1,000 mcg as directed every 30 (thirty) days. About middle of month         . zolpidem (AMBIEN) 5 MG tablet   Oral   Take 5 mg by mouth at bedtime as needed. For sleep          Triage Vitals: BP 122/60  Pulse 79  Temp(Src) 97.9 F (36.6 C) (Oral)  Resp 24  Ht 5\' 3"  (1.6 m)  Wt 205 lb (92.987 kg)  BMI 36.32 kg/m2  SpO2 100%  LMP 07/15/2006 Physical Exam CONSTITUTIONAL: Well developed/well nourished, anxious HEAD: Normocephalic/atraumatic EYES: EOMI/PERRL ENMT: Mucous membranes moist; no stridor; no facial swelling; uvula midline without edema or erythema to oropharynx, no trismus.  Normal phonation.  She is handling secretions.   NECK: supple no meningeal signs; no anterior neck swelling noted, no cervical adenopathy CV: S1/S2 noted, no murmurs/rubs/gallops noted LUNGS: Lungs are clear to auscultation bilaterally, no apparent distress ABDOMEN: soft, nontender, no rebound or guarding GU:no cva tenderness NEURO: Pt is awake/alert, moves all extremitiesx4 EXTREMITIES: pulses normal, full ROM, no LE edema SKIN: warm, color normal PSYCH: anxious; hyperventilating  ED Course  Procedures (including critical care time) DIAGNOSTIC STUDIES: Oxygen Saturation is 100% on RA, normal  by my interpretation.   COORDINATION OF CARE: 11:47 PM- Will obtain a CXR and EKG. Pt verbalizes understanding and agrees to plan.  Pt feeling improved Imaging/ekg unremarkable No hypoxia, no CP, doubt ACS/PE No signs of CHF No signs of airway compromise Suspect symptoms may be caused by anxiety I spoke at length with patient/family about strict return precautions They are agreeable with plan  Medications - No data to display  Labs Review Labs Reviewed - No data to display Imaging Review No results found.  EKG Interpretation    Date/Time:  Monday February 10 2013 23:48:44 EST Ventricular Rate:  74 PR Interval:  136 QRS Duration: 82 QT Interval:  404 QTC Calculation: 448 R Axis:   55 Text Interpretation:  Normal sinus rhythm Normal ECG Confirmed by Bebe Shaggy  MD, Chane Magner ([id number]) on 02/11/2013 12:03:39 AM            MDM  No diagnosis found. Nursing notes including past medical history and social history reviewed and considered in documentation xrays reviewed and considered     Joya Gaskins, MD 02/11/13 [phone]

## 2013-02-10 NOTE — ED Notes (Signed)
Patient states she feels like her throat is closing up and she can't get a good breath.  02 sats 100% on room air, attempts made to reassure her that she is getting good oxygen level in her blood and showed her the 100% sat with explanation of the meaning,  encouraged to slow her breathing if possible.

## 2013-02-11 ENCOUNTER — Encounter (HOSPITAL_COMMUNITY): Payer: Self-pay | Admitting: Emergency Medicine

## 2013-02-11 MED ORDER — LORAZEPAM 1 MG PO TABS
1.0000 mg | ORAL_TABLET | Freq: Once | ORAL | Status: AC
  Administered 2013-02-11: 1 mg via ORAL
  Filled 2013-02-11: qty 1

## 2013-02-11 NOTE — ED Notes (Signed)
Patient on bed at this time. Breathing heavily. Instructed patient that she needs to take slow deep breaths. Oxygen stat are 100 percent.

## 2013-02-11 NOTE — ED Notes (Signed)
Ambulatory without assistance to bathroom-

## 2013-02-11 NOTE — ED Notes (Signed)
EKG completed - patient still breathing rapidly.  Encouraged to slow her breathing down- acknowledges but states she cant.  Anterior neck is red from patient hold that area

## 2013-05-05 ENCOUNTER — Encounter: Payer: Self-pay | Admitting: Family Medicine

## 2013-05-26 ENCOUNTER — Other Ambulatory Visit: Payer: Self-pay | Admitting: Obstetrics and Gynecology

## 2013-06-04 ENCOUNTER — Encounter: Payer: Self-pay | Admitting: *Deleted

## 2013-06-06 ENCOUNTER — Encounter: Payer: Self-pay | Admitting: Gastroenterology

## 2013-07-17 ENCOUNTER — Encounter: Payer: Self-pay | Admitting: Gastroenterology

## 2013-08-11 ENCOUNTER — Encounter: Payer: Self-pay | Admitting: Gastroenterology

## 2013-10-10 ENCOUNTER — Ambulatory Visit (AMBULATORY_SURGERY_CENTER): Payer: Self-pay

## 2013-10-10 VITALS — Ht 62.25 in | Wt 178.0 lb

## 2013-10-10 DIAGNOSIS — Z85038 Personal history of other malignant neoplasm of large intestine: Secondary | ICD-10-CM

## 2013-10-10 MED ORDER — SUPREP BOWEL PREP KIT 17.5-3.13-1.6 GM/177ML PO SOLN: 1.0000 | Freq: Once | ORAL | Status: DC

## 2013-10-10 NOTE — Progress Notes (Signed)
No allergies to eggs or soy No past problems with anesthesia except PONV(usually is pre-medicated)  Advised pt that propofol usually doesn't cause N/V but pt stated since colon CA gets sick immediately or shortly after no matter what kind of anesthesia No diet/weight loss meds No home oxygen  Has email  Emmi instructions given for colonoscopy

## 2013-10-24 ENCOUNTER — Encounter: Payer: Self-pay | Admitting: Gastroenterology

## 2013-10-24 ENCOUNTER — Ambulatory Visit (AMBULATORY_SURGERY_CENTER): Payer: BC Managed Care – PPO | Admitting: Gastroenterology

## 2013-10-24 VITALS — BP 108/63 | HR 58 | Temp 97.9°F | Resp 16 | Ht 62.25 in | Wt 178.0 lb

## 2013-10-24 DIAGNOSIS — Z85038 Personal history of other malignant neoplasm of large intestine: Secondary | ICD-10-CM

## 2013-10-24 LAB — GLUCOSE, CAPILLARY
GLUCOSE-CAPILLARY: 108 mg/dL — AB (ref 70–99)
Glucose-Capillary: 93 mg/dL (ref 70–99)

## 2013-10-24 MED ORDER — SODIUM CHLORIDE 0.9 % IV SOLN: 500.0000 mL | INTRAVENOUS | Status: DC

## 2013-10-24 NOTE — Op Note (Signed)
[place]  [address] [place], [id number]   COLONOSCOPY PROCEDURE REPORT  PATIENT: Kelli Cruz, Kelli Cruz  MR#: [id number] BIRTHDATE: 03/14/1960 , 52  yrs. old GENDER: Female ENDOSCOPIST: Inda Castle, MD REFERRED BY: PROCEDURE DATE:  10/24/2013 PROCEDURE:   Colonoscopy, diagnostic First Screening Colonoscopy - Avg.  risk and is 50 yrs.  old or older - No.  Prior Negative Screening - Now for repeat screening. N/A  History of Adenoma - Now for follow-up colonoscopy & has been > or = to 3 yrs.  N/A  Polyps Removed Today? No.  Recommend repeat exam, <10 yrs? Yes.  High risk (family or personal hx). ASA CLASS:   Class II INDICATIONS:High risk patient with personal history of colon cancer.  MEDICATIONS: MAC sedation, administered by CRNA and propofol (Diprivan) 200mg  IV  DESCRIPTION OF PROCEDURE:   After the risks benefits and alternatives of the procedure were thoroughly explained, informed consent was obtained.  A digital rectal exam revealed no abnormalities of the rectum.   The LB [id number]  endoscope was introduced through the anus and advanced to the cecum, which was identified by both the appendix and ileocecal valve. No adverse events experienced.   The quality of the prep was excellent using Suprep  The instrument was then slowly withdrawn as the colon was fully examined.      COLON FINDINGS: A normal appearing cecum, ileocecal valve, and appendiceal orifice were identified.  The ascending, hepatic flexure, transverse, splenic flexure, descending, sigmoid colon and rectum appeared unremarkable.  No polyps or cancers were seen. Retroflexed views revealed no abnormalities. The time to cecum=2 minutes 36 seconds.  Withdrawal time=7 minutes 56 seconds.  The scope was withdrawn and the procedure completed. COMPLICATIONS: There were no complications.  ENDOSCOPIC IMPRESSION: Normal colon  RECOMMENDATIONS: Colonoscopy 5 years   eSigned:   Inda Castle, MD 10/24/2013 9:31 AM   cc: Abner Greenspan, MD

## 2013-10-24 NOTE — Patient Instructions (Signed)

## 2013-10-24 NOTE — Progress Notes (Signed)
Procedure ends, to recovery, report given and VSS. 

## 2013-10-27 ENCOUNTER — Telehealth: Payer: Self-pay | Admitting: *Deleted

## 2013-10-27 NOTE — Telephone Encounter (Signed)
  Follow up Call-  Call back number 10/24/2013  Post procedure Call Back phone  # [phone]  Permission to leave phone message Yes    Voicemail full; not able to leave a message

## 2013-11-20 ENCOUNTER — Other Ambulatory Visit: Payer: Self-pay | Admitting: Family Medicine

## 2013-11-23 ENCOUNTER — Other Ambulatory Visit: Payer: Self-pay | Admitting: Family Medicine

## 2014-01-04 ENCOUNTER — Other Ambulatory Visit: Payer: Self-pay | Admitting: Family Medicine

## 2014-04-10 ENCOUNTER — Encounter: Payer: BC Managed Care – PPO | Admitting: Family Medicine

## 2014-05-19 ENCOUNTER — Other Ambulatory Visit: Payer: Self-pay | Admitting: Family Medicine

## 2014-05-30 ENCOUNTER — Other Ambulatory Visit: Payer: Self-pay | Admitting: Family Medicine

## 2014-06-01 NOTE — Telephone Encounter (Signed)
Received refill request electronically from pharmacy. Last office visit 10/26/12, cancelled appointment 04/10/14, rescheduled 10/09/14. Is it okay to refill medication?

## 2014-06-01 NOTE — Telephone Encounter (Signed)
Please refill to get through to her visit in Aug thanks

## 2014-06-02 NOTE — Telephone Encounter (Signed)
Done

## 2014-06-03 ENCOUNTER — Other Ambulatory Visit: Payer: Self-pay | Admitting: Family Medicine

## 2014-07-14 ENCOUNTER — Encounter: Payer: Self-pay | Admitting: Gastroenterology

## 2014-08-12 ENCOUNTER — Encounter: Payer: Self-pay | Admitting: Family Medicine

## 2014-08-12 ENCOUNTER — Ambulatory Visit (INDEPENDENT_AMBULATORY_CARE_PROVIDER_SITE_OTHER): Payer: BLUE CROSS/BLUE SHIELD | Admitting: Family Medicine

## 2014-08-12 VITALS — BP 116/76 | HR 67 | Temp 98.1°F | Ht 63.0 in | Wt 199.8 lb

## 2014-08-12 DIAGNOSIS — R208 Other disturbances of skin sensation: Secondary | ICD-10-CM | POA: Diagnosis not present

## 2014-08-12 DIAGNOSIS — E785 Hyperlipidemia, unspecified: Secondary | ICD-10-CM | POA: Diagnosis not present

## 2014-08-12 DIAGNOSIS — R202 Paresthesia of skin: Secondary | ICD-10-CM | POA: Insufficient documentation

## 2014-08-12 DIAGNOSIS — E538 Deficiency of other specified B group vitamins: Secondary | ICD-10-CM

## 2014-08-12 DIAGNOSIS — E119 Type 2 diabetes mellitus without complications: Secondary | ICD-10-CM

## 2014-08-12 DIAGNOSIS — E039 Hypothyroidism, unspecified: Secondary | ICD-10-CM | POA: Diagnosis not present

## 2014-08-12 MED ORDER — MAGIC MOUTHWASH W/LIDOCAINE: 5.0000 mL | Freq: Three times a day (TID) | ORAL | Status: DC
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
## 2014-08-12 NOTE — Patient Instructions (Signed)
Try magic mouthwash for mouth burning  We may want to consider a neurology evaluation for your foot in the future - keep me posted  Labs today

## 2014-08-12 NOTE — Progress Notes (Signed)
Pre visit review using our clinic review tool, if applicable. No additional management support is needed unless otherwise documented below in the visit note. 

## 2014-08-13 ENCOUNTER — Encounter: Payer: Self-pay | Admitting: *Deleted

## 2014-08-13 LAB — COMPREHENSIVE METABOLIC PANEL
ALBUMIN: 4.4 g/dL (ref 3.5–5.2)
ALK PHOS: 69 U/L (ref 39–117)
ALT: 32 U/L (ref 0–35)
AST: 33 U/L (ref 0–37)
BUN: 18 mg/dL (ref 6–23)
CALCIUM: 10.1 mg/dL (ref 8.4–10.5)
CO2: 32 mEq/L (ref 19–32)
CREATININE: 0.79 mg/dL (ref 0.40–1.20)
Chloride: 103 mEq/L (ref 96–112)
GFR: 81.33 mL/min (ref >60.00)
GLUCOSE: 89 mg/dL (ref 70–99)
POTASSIUM: 4.6 meq/L (ref 3.5–5.1)
Sodium: 141 mEq/L (ref 135–145)
Total Bilirubin: 0.6 mg/dL (ref 0.2–1.2)
Total Protein: 7.5 g/dL (ref 6.0–8.3)

## 2014-08-13 LAB — CBC WITH DIFFERENTIAL/PLATELET
Basophils Absolute: 0 10*3/uL (ref 0.0–0.1)
Basophils Relative: 0.4 % (ref 0.0–3.0)
Eosinophils Absolute: 0.1 10*3/uL (ref 0.0–0.7)
Eosinophils Relative: 1.8 % (ref 0.0–5.0)
HEMATOCRIT: 39.5 % (ref 36.0–46.0)
Hemoglobin: 13 g/dL (ref 12.0–15.0)
LYMPHS PCT: 36.1 % (ref 12.0–46.0)
Lymphs Abs: 2.9 10*3/uL (ref 0.7–4.0)
MCHC: 32.8 g/dL (ref 30.0–36.0)
MCV: 92.6 fl (ref 78.0–100.0)
MONO ABS: 0.3 10*3/uL (ref 0.1–1.0)
MONOS PCT: 3.3 % (ref 3.0–12.0)
NEUTROS ABS: 4.7 10*3/uL (ref 1.4–7.7)
Neutrophils Relative %: 58.4 % (ref 43.0–77.0)
Platelets: 311 10*3/uL (ref 150.0–400.0)
RBC: 4.27 Mil/uL (ref 3.87–5.11)
RDW: 13.8 % (ref 11.5–15.5)
WBC: 8.1 10*3/uL (ref 4.0–10.5)

## 2014-08-13 LAB — LIPID PANEL
CHOL/HDL RATIO: 4
Cholesterol: 153 mg/dL (ref 0–200)
HDL: 41.5 mg/dL (ref >39.00)
LDL CALC: 91 mg/dL (ref 0–99)
NonHDL: 111.5
Triglycerides: 103 mg/dL (ref 0.0–149.0)
VLDL: 20.6 mg/dL (ref 0.0–40.0)

## 2014-08-13 LAB — HEMOGLOBIN A1C: Hgb A1c MFr Bld: 6.2 % (ref 4.6–6.5)

## 2014-08-13 LAB — VITAMIN B12

## 2014-08-13 LAB — TSH: TSH: 1.35 u[IU]/mL (ref 0.35–4.50)

## 2014-08-13 NOTE — Assessment & Plan Note (Signed)
?   Etiology Since localized ? If poss radicular  Nl foot exam otherwise Already on gabapentin as well  Consider neuro ref in future for NCV

## 2014-08-13 NOTE — Assessment & Plan Note (Signed)
A1C today  Has been lost to f/u but home glucose readings are good  Diet improved Wt gain noted Now making effort to get to the gym   Will plan PE as well

## 2014-08-13 NOTE — Assessment & Plan Note (Signed)
Suspect burning mouth syndrome  Lab today  Nl exam  Will use nite guard to reduce bruxism Given px for magic mouthwash with lidocaine to try  Will update

## 2014-08-13 NOTE — Assessment & Plan Note (Signed)
tsh today No clinical changes except better mood/ better control of depression

## 2014-08-13 NOTE — Assessment & Plan Note (Signed)
Lab today  Diet has been improved

## 2014-08-13 NOTE — Assessment & Plan Note (Signed)
Level today  Getting injections in office closer to where she lives

## 2014-08-25 ENCOUNTER — Encounter: Payer: Self-pay | Admitting: Genetic Counselor

## 2014-08-28 LAB — HM DIABETES EYE EXAM

## 2014-10-09 ENCOUNTER — Encounter: Payer: Self-pay | Admitting: Family Medicine

## 2014-10-09 ENCOUNTER — Other Ambulatory Visit: Payer: Self-pay | Admitting: Obstetrics and Gynecology

## 2014-10-12 LAB — CYTOLOGY - PAP

## 2014-10-16 LAB — HM MAMMOGRAPHY: HM MAMMO: NORMAL (ref 0–4)

## 2014-10-21 DIAGNOSIS — Z0289 Encounter for other administrative examinations: Secondary | ICD-10-CM

## 2014-12-01 ENCOUNTER — Other Ambulatory Visit: Payer: Self-pay | Admitting: Family Medicine

## 2014-12-01 NOTE — Telephone Encounter (Signed)
Electronic refill request, pt has cancelled her last 2 CPE appt and no future appts. scheduled, pt has only had an acute appt recently, please advise

## 2014-12-01 NOTE — Telephone Encounter (Signed)
Please schedule f/u in Dec and refill until then

## 2014-12-02 NOTE — Telephone Encounter (Signed)
appt scheduled and meds refilled 

## 2014-12-10 ENCOUNTER — Telehealth: Payer: Self-pay | Admitting: Family Medicine

## 2014-12-10 DIAGNOSIS — R200 Anesthesia of skin: Secondary | ICD-10-CM

## 2014-12-10 NOTE — Telephone Encounter (Signed)
I ordered the neuro ref  I am not back until Tues to do an order - for the B12 shots Can it be done electronically?

## 2014-12-10 NOTE — Telephone Encounter (Signed)
Spoke with pt and advise her referral done and one of our PCCs will call to schedule appt  Pt said they already gave her the b12 inj they just need the order faxed to them when we can so she is okay with [place] sending it next week when Dr. Glori Bickers returns. I will resend message to Dr. Glori Bickers on Tuesday when she returns

## 2014-12-10 NOTE — Telephone Encounter (Signed)
Patient was told to do her B-12 shots every other month.  Patient has her B-12 shots given at [facility].  Patient needs an order for her B-12 shots faxed to [phone]. Patient was told if her left foot continues to bother her, she could be referred for a nerve conduction study.  Patient would like to be referred to Dr.Stern or Dr.Hawkins at [facility] on [facility].  Patient can go anytime, but she'd prefer late in the day since she works in [place].  Patient has been seen at this office before for tingling in her hands and feet and she had a nerve conduction study done there.

## 2014-12-10 NOTE — Addendum Note (Signed)
Addended by: Loura Pardon A on: 12/10/2014 01:59 PM   Modules accepted: Orders

## 2014-12-15 MED ORDER — CYANOCOBALAMIN 1000 MCG/ML IJ SOLN: 1000.0000 ug | INTRAMUSCULAR | Status: DC

## 2014-12-15 NOTE — Addendum Note (Signed)
Addended by: Loura Pardon A on: 12/15/2014 01:18 PM   Modules accepted: Orders

## 2014-12-15 NOTE — Telephone Encounter (Signed)
Printed and in IN box  

## 2014-12-15 NOTE — Telephone Encounter (Signed)
See prev note

## 2014-12-17 NOTE — Telephone Encounter (Signed)
Rx faxed

## 2015-01-06 ENCOUNTER — Encounter: Payer: Self-pay | Admitting: Neurology

## 2015-01-06 ENCOUNTER — Ambulatory Visit (INDEPENDENT_AMBULATORY_CARE_PROVIDER_SITE_OTHER): Payer: BLUE CROSS/BLUE SHIELD | Admitting: Neurology

## 2015-01-06 VITALS — BP 132/84 | HR 86 | Resp 20 | Ht 62.5 in | Wt 202.0 lb

## 2015-01-06 DIAGNOSIS — F5105 Insomnia due to other mental disorder: Secondary | ICD-10-CM | POA: Insufficient documentation

## 2015-01-06 DIAGNOSIS — R0683 Snoring: Secondary | ICD-10-CM

## 2015-01-06 DIAGNOSIS — R5383 Other fatigue: Secondary | ICD-10-CM | POA: Diagnosis not present

## 2015-01-06 DIAGNOSIS — F32A Depression, unspecified: Secondary | ICD-10-CM

## 2015-01-06 DIAGNOSIS — G4719 Other hypersomnia: Secondary | ICD-10-CM

## 2015-01-06 DIAGNOSIS — F329 Major depressive disorder, single episode, unspecified: Secondary | ICD-10-CM

## 2015-01-06 DIAGNOSIS — G4730 Sleep apnea, unspecified: Secondary | ICD-10-CM

## 2015-01-06 DIAGNOSIS — G4710 Hypersomnia, unspecified: Secondary | ICD-10-CM | POA: Diagnosis not present

## 2015-01-06 NOTE — Patient Instructions (Signed)

## 2015-01-06 NOTE — Progress Notes (Signed)
SLEEP MEDICINE CLINIC   Provider:  Larey Seat, M D  Referring Provider: Eino Farber, PA-C Primary Care Physician:  Loura Pardon, MD  Chief Complaint  Patient presents with  . New Patient (Initial Visit)    snores, witnessed apnea, feels like she never gets a good nights sleep, rm 10, alone    Chief complaint according to patient : Patient reports that she has been taking Ambien regularly now as she suffers from insomnia. Even with Ambien she wakes sometimes up at 30 3:30 AM has trouble to go back to sleep.  HPI:  Kelli Cruz is a 54 y.o. female , seen here as a referral from Dr. Glori Bickers and PA  Ysidro Evert  @[email or web address] for a sleep evaluation,  Mrs. Husser reports that she feels not refreshed and restored and make waking up in the morning. 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 She feels excessively daytime sleepy as well as fatigued and she has been told that she snores. Insomnia developed slowly and gradually. Over the duration of her 37 year marriage her husband has always known her to snore but the insomnia component seems to have exacerbated over the last year or so. The degree of fatigue and daytime is also new to her. She has gained about 20 pounds over the last 2-1/2 years after injuring a lumbar vertebra. She has been a patient of [facility] for several years. Since she was first seen in 2012 her degree of anxiety and depression is greatly improved.   I reviewed her list of medication , of family history /social history and past medical history. The patient is not a tobacco user she does not use any illegal drugs she drinks socially alcohol she drinks daily [facility], she drinks coffee about once a week, no iced tea.  She endorsed the fatigue severity questionnaire today at 43 points which is elevated.  She endorsed the Epworth sleepiness score at 12 points one point was sitting  and reading 1.4 sitting inactive, 2 points as a passenger in a car and as sitting quietly after lunch, 3 points each for lying down to rest and watching television.   Sleep habits are as follows: By 8 PM the patient feels often so tired that she will go to bed at this early. She relies on Ambien to initiate sleep otherwise she will lay awake for 3 or 4 hours she reports. With Ambien she will sleep  uninterrupted for 6 or 7 hours. She has an InterStim implant and she has no nocturia since. Dr. Titus Mould at [facility]. She usually rests on one pillow and she will cover her eyes at night. The bedroom is cool, quiet and dark except for her husband snoring. She started wearing ear plugs. She has noted that if she goes to bed with a full stomach she is more prone to have nightmarish dreams. But she does not drink frequently mostly she cannot remember if she trained at all. Her husband has noted her to sleep talk. She has not enacted dreams otherwise. She has never been woken by headaches .She usually does not have significant pain at night.  Once she finds a comfortable sleep position, she usually rests supine. She wakes up with a dry mouth in the morning usually rises at about 5 AM , not with headaches . She was diagnosed with a burning mouth syndrome and lingula geographica. She has woken up  with chest pain and palpitations.    Sleep medical history and family sleep history:  Father and brother but diagnosed with sleep apnea.   Social history:   Review of Systems: Out of a complete 14 system review, the patient complains of only the following symptoms, and all other reviewed systems are negative. Urinary incontinence, snoring, fatigue, depression, anxiety, not enough sleep, decreased energy, insomnia. Excessive daytime sleepiness. Witnessed snoring and witnessed apnea. History of diabetes, hypercholesterolemia and colon cancer.     Social History   Social History  . Marital Status: Married      Spouse Name: N/A  . Number of Children: 0  . Years of Education: N/A   Occupational History  . [employer]   . helps care for her mother    .  [employer]   Social History Main Topics  . Smoking status: Passive Smoke Exposure - Never Smoker  . Smokeless tobacco: Never Used  . Alcohol Use: 0.0 oz/week    0 drink(s) per week     Comment: socially twice a month at most or less  . Drug Use: No  . Sexual Activity: Not on file   Other Topics Concern  . Not on file   Social History Narrative   Daily caffeine use    Family History  Problem Relation Age of Onset  . Breast cancer Mother   . Diabetes Mother   . Coronary artery disease Mother   . Kidney disease Mother     renal insufficiency  . Hyperlipidemia Mother   . Diabetes Father   . Coronary artery disease Father   . Colon cancer Father   . Colon cancer Paternal Grandmother   . Breast cancer Maternal Aunt   . Breast cancer Paternal Aunt     Past Medical History  Diagnosis Date  . Anxiety   . Depression   . Diabetes mellitus     type II  . Hypothyroidism   . Endometriosis   . Ganglion cyst   . Bipolar 1 disorder ([place])   . Family history of malignant neoplasm of gastrointestinal tract     Past Surgical History  Procedure Laterality Date  . Laparoscopy  1997    D & C for endometriosis  . Dilation and curettage of uterus  2006    miscarriage   . Abdominal hysterectomy    . Knee dislocation surgery    . Ganglion cyst excision    . Colon resection  June 2010  . Colon surgery    . Colonoscopy    . Hernia repair    . Abdominal hysterectomy    . Interstim implant placement Left 12/11/2012    stimulator is on the left but the electrodes go to the right    Current Outpatient Prescriptions  Medication Sig Dispense Refill  . acetaminophen (TYLENOL) 325 MG tablet Take 650 mg by mouth every 6 (six) hours as needed. For pain    . ALPRAZolam (XANAX) 0.5 MG tablet Take 0.5 mg by mouth at bedtime as needed.  For anxiety    . Alum & Mag Hydroxide-Simeth (MAGIC MOUTHWASH W/LIDOCAINE) SOLN Take 5 mLs by mouth 3 (three) times daily. 150 mL 5  . amphetamine-dextroamphetamine (ADDERALL XR) 30 MG 24 hr capsule Take 30 mg by mouth every morning.    Marland Kitchen asenapine (SAPHRIS) 5 MG SUBL 24 hr tablet 10 mg 1 day or 1 dose. Takes for depression    . citalopram (CELEXA) 40 MG tablet     . cyanocobalamin (,  VITAMIN B-12,) 1000 MCG/ML injection Inject 1 mL (1,000 mcg total) into the muscle every 30 (thirty) days. (Patient taking differently: Inject 1,000 mcg into the muscle every 8 (eight) weeks. ) 1 mL 11  . Diabetic Sterile Lancets MISC by Does not apply route. Check glucose two times a day and as needed for out of control DM 250.00     . ESTRADIOL VA Place vaginally 2 (two) times a week.      . gabapentin (NEURONTIN) 300 MG capsule Take 300 mg by mouth 2 (two) times daily.    Marland Kitchen glipiZIDE (GLUCOTROL XL) 5 MG 24 hr tablet TAKE 1 TABLET BY MOUTH EVERY DAY 90 tablet 3  . glucose blood test strip 1 each by Other route. Check glucose two times a day and as needed for out of control DM 250.00     . ibuprofen (ADVIL,MOTRIN) 200 MG tablet Take 400 mg by mouth as directed. For pain    . levothyroxine (SYNTHROID, LEVOTHROID) 88 MCG tablet TAKE 1 TABLET BY MOUTH DAILY 90 tablet 0  . metFORMIN (GLUCOPHAGE) 1000 MG tablet TAKE 1 TABLET BY MOUTH TWICE A DAY WITH MEAL 180 tablet 0  . simvastatin (ZOCOR) 20 MG tablet TAKE 1 TABLET BY MOUTH AT BEDTIME 90 tablet 0  . VITAMIN B1-B12 IJ Inject 1,000 mcg as directed every 30 (thirty) days. About middle of month    . zolpidem (AMBIEN) 5 MG tablet Take 5 mg by mouth at bedtime as needed. For sleep    . [DISCONTINUED] cholestyramine (QUESTRAN) 4 G packet Mix with juice or water and drink every 1-3 days. 30 each 1   No current facility-administered medications for this visit.    Allergies as of 01/06/2015 - Review Complete 01/06/2015  Allergen Reaction Noted  . Hydrocodone  09/12/2006  .  Milk-related compounds Nausea And Vomiting 05/01/2011    Vitals: BP 132/84 mmHg  Pulse 86  Resp 20  Ht 5' 2.5" (1.588 m)  Wt 202 lb (91.627 kg)  BMI 36.33 kg/m2  LMP 07/15/2006 Last Weight:  Wt Readings from Last 1 Encounters:  01/06/15 202 lb (91.627 kg)   [id number] mass index is 36.33 kg/(m^2).     Last Height:   Ht Readings from Last 1 Encounters:  01/06/15 5' 2.5" (1.588 m)    Physical exam:  General: The patient is awake, alert and appears not in acute distress. The patient is well groomed. Head: Normocephalic, atraumatic. Neck is supple. Mallampati 3,  neck circumference:16. Nasal airflow unrestricted, TMJ clicking evident . Retrognathia is not seen.  Cardiovascular:  Regular rate and rhythm , without  murmurs or carotid bruit, and without distended neck veins. Respiratory: Lungs are clear to auscultation. Skin:  Without evidence of edema, or rash Trunk: BMI is elevated . [name] posture is erect  Neurologic exam : The patient is awake and alert, oriented to place and time.   Memory subjective  described as intact.  Attention span & concentration ability appears normal.  Speech is fluent,  without ysarthria, dysphonia or aphasia.  Mood and affect are appropriate.  Cranial nerves: Pupils are equal and briskly reactive to light.  Extraocular movements  in vertical and horizontal planes intact and without nystagmus. Visual fields by finger perimetry are intact. Hearing to finger rub intact. Facial sensation intact to fine touch.Facial motor strength is symmetric and tongue and uvula move midline. Shoulder shrug was symmetrical.   Motor exam:  Normal tone, muscle bulk and symmetric strength in all extremities. Sensory:  Fine touch, pinprick and vibration were tested in all extremities.  Coordination: Rapid alternating movements in the fingers/hands ,Finger-to-nose maneuver without evidence of ataxia, dysmetria or tremor. Gait and station: Patient walks without  assistive device and is able unassisted to climb up to the exam table. Strength within normal limits.  Stance is stable and normal.    Deep tendon reflexes: in the upper and lower extremities are symmetric and intact. Babinski maneuver response is downgoing.  The patient was advised of the nature of the diagnosed sleep disorder , the treatment options and risks for general a health and wellness arising from not treating the condition.  I spent more than 40 minutes of face to face time with the patient. Greater than 50% of time was spent in counseling and coordination of care. We have discussed the diagnosis and differential and I answered the patient's questions.     Assessment:  After physical and neurologic examination, review of laboratory studies,  Personal review of imaging studies, reports of other /same  Imaging studies ,  Results of polysomnography/ neurophysiology testing and pre-existing records as far as provided in visit., my assessment is   1) Mrs. Kornegay has chronic insomnia which could be related to obstructive sleep apnea and could also be related to anxiety or depression.Marland Kitchen   2) OSA;Sometimes she snores sometimes she gasps for air and she has gained significant weight since her back injury. She has risk factors by BMI, neck circumference at Mallampati to have obstructive sleep apnea.  3) hypersomnia and fatigue. The patient endorsed a high degree of daytime fatigue which I attributed to not getting restful and restorative sleep. She's not necessarily struggling to stay awake all day but she feels exhausted to point where she cannot longer function will be productive. She had some micro-sleep attacks at work where she sits at a desk. Her office has natural daylight. But she sits with her back to the window.     Plan:  Treatment plan and additional workup :     Larey Seat MD  01/06/2015   CC: Eino Farber, [place], [place]  [id number]

## 2015-01-27 ENCOUNTER — Ambulatory Visit: Payer: BLUE CROSS/BLUE SHIELD | Admitting: Family Medicine

## 2015-02-03 ENCOUNTER — Ambulatory Visit (INDEPENDENT_AMBULATORY_CARE_PROVIDER_SITE_OTHER): Payer: BLUE CROSS/BLUE SHIELD | Admitting: Family Medicine

## 2015-02-03 ENCOUNTER — Encounter: Payer: Self-pay | Admitting: Family Medicine

## 2015-02-03 VITALS — BP 110/80 | HR 79 | Temp 98.2°F | Ht 62.5 in | Wt 202.8 lb

## 2015-02-03 DIAGNOSIS — E538 Deficiency of other specified B group vitamins: Secondary | ICD-10-CM

## 2015-02-03 DIAGNOSIS — E669 Obesity, unspecified: Secondary | ICD-10-CM

## 2015-02-03 DIAGNOSIS — E119 Type 2 diabetes mellitus without complications: Secondary | ICD-10-CM

## 2015-02-03 DIAGNOSIS — E785 Hyperlipidemia, unspecified: Secondary | ICD-10-CM | POA: Diagnosis not present

## 2015-02-03 DIAGNOSIS — E039 Hypothyroidism, unspecified: Secondary | ICD-10-CM

## 2015-02-03 DIAGNOSIS — R208 Other disturbances of skin sensation: Secondary | ICD-10-CM

## 2015-02-03 MED ORDER — MAGIC MOUTHWASH W/LIDOCAINE: 5.0000 mL | Freq: Three times a day (TID) | ORAL | Status: DC

## 2015-02-03 NOTE — Progress Notes (Signed)
Subjective:    Patient ID: Kelli Cruz, female    DOB: October 26, 1960, 54 y.o.   MRN: [id number]  HPI Here for f/u of chronic medical problems   Feeling about the same overall   Has geographic tongue/ with burning mouth syndrome- going to the school of dentistry at [place] has not helped  Also working on getting a mouth guard  Unsure what the cause is   Has upcoming appt for foot pain   Had a cystoscopy for blood in urine- nl cysto (with some urethritis) Then CT scan  Saw a kidney stone   Hx of B12 def Lab Results  Component Value Date   VITAMINB12 >1500* 08/12/2014      Wt is stable with bmi of 37  Diet is about the same   Hypothyroidism  Pt has no clinical changes No change in energy level/ hair or skin/ edema and no tremor Lab Results  Component Value Date   TSH 1.35 08/12/2014     Diabetes Home sugar results - a few in the 80s (feels bad if under 90) -nothing high  DM diet -fair  Exercise - when she exercises her foot hurt - waiting to get foot evaluated  Symptoms-none (? If burning mouth is a symptom) A1C last  Lab Results  Component Value Date   HGBA1C 6.2 08/12/2014    No problems with medications  Renal protection- bp too low for ace  Last eye exam - no retinopathy - July   Hyperlipidemia zocor and diet Lab Results  Component Value Date   CHOL 153 08/12/2014   HDL 41.50 08/12/2014   LDLCALC 91 08/12/2014   LDLDIRECT 147.2 12/11/2008   TRIG 103.0 08/12/2014   CHOLHDL 4 08/12/2014    She is trying to stay away from fatty foods Had shrimp last night - (not often)   Mood is overall fairly stable - despite stress at work   Patient Active Problem List   Diagnosis Date Noted  . Hypersomnia with sleep apnea 01/06/2015  . Insomnia due to mental condition   . Burning sensation of the foot 08/12/2014  . Burning sensation of mouth 08/12/2014  . Numbness 10/24/2011  . Routine general medical examination at a health care facility  10/24/2011  . TMJ arthralgia 08/14/2011  . Cervical pain 07/26/2011  . Pain in thoracic spine 07/26/2011  . Lumbar pain 07/26/2011  . Muscle pain 07/26/2011  . Poor posture 07/26/2011  . Diarrhea 08/25/2010  . Blurred vision 05/31/2010  . Speech abnormality 05/31/2010  . B12 deficiency 04/13/2010  . ADENOCARCINOMA, SIGMOID COLON 08/11/2008  . Hyperlipidemia 07/08/2008  . Hypothyroidism 09/12/2006  . Diabetes type 2, controlled ([place]) 09/12/2006  . ANXIETY 09/12/2006  . DEPRESSION 09/12/2006  . ALLERGIC RHINITIS, SEASONAL 09/12/2006  . FIBROCYSTIC BREAST DISEASE 09/12/2006  . MIGRAINES, HX OF 09/12/2006   Past Medical History  Diagnosis Date  . Anxiety   . Depression   . Diabetes mellitus     type II  . Hypothyroidism   . Endometriosis   . Ganglion cyst   . Bipolar 1 disorder ([place])   . Family history of malignant neoplasm of gastrointestinal tract   . Insomnia due to mental condition    Past Surgical History  Procedure Laterality Date  . Laparoscopy  1997    D & C for endometriosis  . Dilation and curettage of uterus  2006    miscarriage   . Abdominal hysterectomy    . Knee dislocation surgery    .  Ganglion cyst excision    . Colon resection  June 2010  . Colon surgery    . Colonoscopy    . Hernia repair    . Abdominal hysterectomy    . Interstim implant placement Left 12/11/2012    stimulator is on the left but the electrodes go to the right   Social History  Substance Use Topics  . Smoking status: Passive Smoke Exposure - Never Smoker  . Smokeless tobacco: Never Used  . Alcohol Use: 0.0 oz/week    0 drink(s) per week     Comment: socially twice a month at most or less   Family History  Problem Relation Age of Onset  . Breast cancer Mother   . Diabetes Mother   . Coronary artery disease Mother   . Kidney disease Mother     renal insufficiency  . Hyperlipidemia Mother   . Diabetes Father   . Coronary artery disease Father   . Colon cancer Father   .  Colon cancer Paternal Grandmother   . Breast cancer Maternal Aunt   . Breast cancer Paternal Aunt    Allergies  Allergen Reactions  . Hydrocodone     REACTION: nausea  . Milk-Related Compounds Nausea And Vomiting   Current Outpatient Prescriptions on File Prior to Visit  Medication Sig Dispense Refill  . acetaminophen (TYLENOL) 325 MG tablet Take 650 mg by mouth every 6 (six) hours as needed. For pain    . ALPRAZolam (XANAX) 0.5 MG tablet Take 0.5 mg by mouth at bedtime as needed. For anxiety    . amphetamine-dextroamphetamine (ADDERALL XR) 30 MG 24 hr capsule Take 30 mg by mouth every morning.    Marland Kitchen asenapine (SAPHRIS) 5 MG SUBL 24 hr tablet 10 mg 1 day or 1 dose. Takes for depression    . citalopram (CELEXA) 40 MG tablet Take 40 mg by mouth daily.     . Diabetic Sterile Lancets MISC by Does not apply route. Check glucose two times a day and as needed for out of control DM 250.00     . gabapentin (NEURONTIN) 300 MG capsule Take 300 mg by mouth 2 (two) times daily.    Marland Kitchen glipiZIDE (GLUCOTROL XL) 5 MG 24 hr tablet TAKE 1 TABLET BY MOUTH EVERY DAY 90 tablet 3  . glucose blood test strip 1 each by Other route. Check glucose two times a day and as needed for out of control DM 250.00     . ibuprofen (ADVIL,MOTRIN) 200 MG tablet Take 400 mg by mouth as directed. For pain    . levothyroxine (SYNTHROID, LEVOTHROID) 88 MCG tablet TAKE 1 TABLET BY MOUTH DAILY 90 tablet 0  . metFORMIN (GLUCOPHAGE) 1000 MG tablet TAKE 1 TABLET BY MOUTH TWICE A DAY WITH MEAL 180 tablet 0  . simvastatin (ZOCOR) 20 MG tablet TAKE 1 TABLET BY MOUTH AT BEDTIME 90 tablet 0  . zolpidem (AMBIEN) 5 MG tablet Take 5 mg by mouth at bedtime as needed. For sleep    . ESTRADIOL VA Place vaginally 2 (two) times a week. Reported on 02/03/2015    . [DISCONTINUED] cholestyramine (QUESTRAN) 4 G packet Mix with juice or water and drink every 1-3 days. 30 each 1   No current facility-administered medications on file prior to visit.      Review of Systems    Review of Systems  Constitutional: Negative for fever, appetite change, fatigue and unexpected weight change.  Eyes: Negative for pain and visual disturbance.  ENT pos  for ongoing burning mouth sensation  Respiratory: Negative for cough and shortness of breath.   Cardiovascular: Negative for cp or palpitations    Gastrointestinal: Negative for nausea, diarrhea and constipation.  Genitourinary: Negative for urgency and frequency.  Skin: Negative for pallor or rash   Neurological: Negative for weakness, light-headedness, numbness and headaches.  Hematological: Negative for adenopathy. Does not bruise/bleed easily.  Psychiatric/Behavioral: pos for stable depression and anxiety      Objective:   Physical Exam  Constitutional: She appears well-developed and well-nourished. No distress.  obese and well appearing   HENT:  Head: Normocephalic and atraumatic.  Mouth/Throat: Oropharynx is clear and moist.  Eyes: Conjunctivae and EOM are normal. Pupils are equal, round, and reactive to light.  Neck: Normal range of motion. Neck supple. No JVD present. Carotid bruit is not present. No thyromegaly present.  Cardiovascular: Normal rate, regular rhythm, normal heart sounds and intact distal pulses.  Exam reveals no gallop.   Pulmonary/Chest: Effort normal and breath sounds normal. No respiratory distress. She has no wheezes. She has no rales.  No crackles  Abdominal: Soft. Bowel sounds are normal. She exhibits no distension, no abdominal bruit and no mass. There is no tenderness.  Musculoskeletal: She exhibits no edema.  Lymphadenopathy:    She has no cervical adenopathy.  Neurological: She is alert. She has normal reflexes.  Skin: Skin is warm and dry. No rash noted.  Psychiatric: She has a normal mood and affect.          Assessment & Plan:   Problem List Items Addressed This Visit      Digestive   B12 deficiency    Lab today for level ? If too high can back  off shots ( ? If rel to her burning mouth problem)      Relevant Orders   Vitamin B12 (Completed)     Endocrine   Diabetes type 2, controlled ([place])    A1C today  Enc low glycemic diet and exercise and wt loss microalb also today      Relevant Orders   Hemoglobin A1c (Completed)   Microalbumin / creatinine urine ratio (Completed)   Hypothyroidism - Primary    Lab Results  Component Value Date   TSH 1.35 08/12/2014   No clinical changes         Other   Burning sensation of mouth    Ongoing Lab for B12 level today  Continue magic mouthwash and f/u at [place] [facility]/ denistry school      Hyperlipidemia    Has been well controlled with simvastatin and diet Disc goals for lipids and reasons to control them Lab today Rev low sat fat diet in detail       Relevant Orders   Lipid panel (Completed)

## 2015-02-03 NOTE — Patient Instructions (Signed)
Lab today Urine microalbumin today  Work on healthy diet and exercise when you can  Follow up in approx 6 months for annual exam

## 2015-02-03 NOTE — Progress Notes (Signed)
Pre visit review using our clinic review tool, if applicable. No additional management support is needed unless otherwise documented below in the visit note. 

## 2015-02-04 DIAGNOSIS — E669 Obesity, unspecified: Secondary | ICD-10-CM | POA: Insufficient documentation

## 2015-02-04 LAB — MICROALBUMIN / CREATININE URINE RATIO
CREATININE, U: 177.4 mg/dL
MICROALB UR: 10 mg/dL — AB (ref 0.0–1.9)
MICROALB/CREAT RATIO: 5.6 mg/g (ref 0.0–30.0)

## 2015-02-04 LAB — LIPID PANEL
CHOL/HDL RATIO: 4
Cholesterol: 150 mg/dL (ref 0–200)
HDL: 40.8 mg/dL (ref >39.00)
LDL Cholesterol: 84 mg/dL (ref 0–99)
NonHDL: 109.54
TRIGLYCERIDES: 126 mg/dL (ref 0.0–149.0)
VLDL: 25.2 mg/dL (ref 0.0–40.0)

## 2015-02-04 LAB — HEMOGLOBIN A1C: Hgb A1c MFr Bld: 6.4 % (ref 4.6–6.5)

## 2015-02-04 LAB — VITAMIN B12: Vitamin B-12: 600 pg/mL (ref 211–911)

## 2015-02-04 NOTE — Assessment & Plan Note (Signed)
Lab Results  Component Value Date   TSH 1.35 08/12/2014   No clinical changes

## 2015-02-04 NOTE — Assessment & Plan Note (Signed)
Lab today for level ? If too high can back off shots ( ? If rel to her burning mouth problem)

## 2015-02-04 NOTE — Assessment & Plan Note (Signed)
Ongoing Lab for B12 level today  Continue magic mouthwash and f/u at [place] [facility]/ denistry school

## 2015-02-04 NOTE — Assessment & Plan Note (Signed)
Has been well controlled with simvastatin and diet Disc goals for lipids and reasons to control them Lab today Rev low sat fat diet in detail

## 2015-02-04 NOTE — Assessment & Plan Note (Signed)
A1C today  Enc low glycemic diet and exercise and wt loss microalb also today

## 2015-02-05 ENCOUNTER — Telehealth: Payer: Self-pay | Admitting: *Deleted

## 2015-02-05 MED ORDER — LISINOPRIL 5 MG PO TABS: 2.5000 mg | ORAL_TABLET | Freq: Every day | ORAL | Status: DC

## 2015-02-05 NOTE — Telephone Encounter (Signed)
-----   Message from Abner Greenspan, MD sent at 02/04/2015  2:29 PM EST ----- B12 level is normal- if you want to space your B12 shots out by another month, you can  Good cholesterol control  A1C is up a bit to 6.4- still well controlled microalbumin is up a bit - that is protein in urine from diabetes  It would be a good idea to start you on a very low dose ace inhibitor (lisinopril) for renal protection-as long as we make sure your blood pressure does not get too low  If agreeable please send in lisinopril 5 mg 1/2 pill once daily #15 11 ref to her pharmacy Thanks

## 2015-02-05 NOTE — Telephone Encounter (Signed)
Rx sent to pharmacy   

## 2015-02-08 ENCOUNTER — Ambulatory Visit (INDEPENDENT_AMBULATORY_CARE_PROVIDER_SITE_OTHER): Payer: BLUE CROSS/BLUE SHIELD | Admitting: Neurology

## 2015-02-08 DIAGNOSIS — G4730 Sleep apnea, unspecified: Secondary | ICD-10-CM

## 2015-02-08 DIAGNOSIS — F32A Depression, unspecified: Secondary | ICD-10-CM

## 2015-02-08 DIAGNOSIS — R5383 Other fatigue: Secondary | ICD-10-CM

## 2015-02-08 DIAGNOSIS — R0683 Snoring: Secondary | ICD-10-CM

## 2015-02-08 DIAGNOSIS — G4719 Other hypersomnia: Secondary | ICD-10-CM

## 2015-02-08 DIAGNOSIS — F329 Major depressive disorder, single episode, unspecified: Secondary | ICD-10-CM

## 2015-02-08 DIAGNOSIS — G4710 Hypersomnia, unspecified: Secondary | ICD-10-CM

## 2015-02-09 NOTE — Sleep Study (Signed)
Please see the scanned sleep study interpretation located in the procedure tab in the chart view section.  

## 2015-02-10 ENCOUNTER — Telehealth: Payer: Self-pay

## 2015-02-10 NOTE — Telephone Encounter (Signed)
Spoke to pt and advised her that her sleep study did not reveal any significant sleep apnea or significant PLMs results in sleep disruption. There were no indications for an organic sleep disorder and no relation to hypersomnia complaint found. Pt verbalized understanding. I advised her to consider a dedicated sleep psychology referral if insomnia is of clinical concern. A follow up appt is not necessary unless she desires it. Pt verbalized understanding. Pt has no further questions or concerns. Pt asked that I fax this sleep study result to Eino Farber, [place].

## 2015-02-10 NOTE — Telephone Encounter (Signed)
Pt returned call. May call back

## 2015-02-10 NOTE — Telephone Encounter (Signed)
Called pt to discuss sleep study results. No answer, left a message asking her to call me back. 

## 2015-03-07 ENCOUNTER — Other Ambulatory Visit: Payer: Self-pay | Admitting: Family Medicine

## 2015-03-10 ENCOUNTER — Other Ambulatory Visit: Payer: Self-pay | Admitting: *Deleted

## 2015-03-10 MED ORDER — LISINOPRIL 5 MG PO TABS: 2.5000 mg | ORAL_TABLET | Freq: Every day | ORAL | Status: DC

## 2015-03-10 MED ORDER — METFORMIN HCL 1000 MG PO TABS: 1000.0000 mg | ORAL_TABLET | Freq: Two times a day (BID) | ORAL | Status: DC

## 2015-03-10 MED ORDER — LEVOTHYROXINE SODIUM 88 MCG PO TABS: 88.0000 ug | ORAL_TABLET | Freq: Every day | ORAL | Status: DC

## 2015-03-10 MED ORDER — GLIPIZIDE ER 5 MG PO TB24: 5.0000 mg | ORAL_TABLET | Freq: Every day | ORAL | Status: DC

## 2015-03-10 MED ORDER — SIMVASTATIN 20 MG PO TABS: 20.0000 mg | ORAL_TABLET | Freq: Every day | ORAL | Status: DC

## 2015-03-10 NOTE — Telephone Encounter (Signed)
Received fax saying Rx have to go through mail order pharmacy, Rxs [facility]

## 2015-04-05 ENCOUNTER — Telehealth: Payer: Self-pay

## 2015-04-05 NOTE — Telephone Encounter (Signed)
Left detailed message on voicemail.  

## 2015-04-05 NOTE — Telephone Encounter (Signed)
Pt left v/m and pt was advised per lab result note 02/05/15 that pt could start on lisinopril as renal protector; pt could not remember if pt had let Dr Glori Bickers know that she has hx of kidney stones and wants to know if kidney stones could affect the 01/2015 lab results. Pt request cb.

## 2015-04-05 NOTE — Telephone Encounter (Signed)
No -I don't think so  It was protein in urine from diabetes -not caused by kidney stone Let her know that Good question, though

## 2015-06-09 DIAGNOSIS — E039 Hypothyroidism, unspecified: Secondary | ICD-10-CM | POA: Diagnosis not present

## 2015-06-09 DIAGNOSIS — F411 Generalized anxiety disorder: Secondary | ICD-10-CM | POA: Diagnosis not present

## 2015-06-09 DIAGNOSIS — F900 Attention-deficit hyperactivity disorder, predominantly inattentive type: Secondary | ICD-10-CM | POA: Diagnosis not present

## 2015-06-09 DIAGNOSIS — F331 Major depressive disorder, recurrent, moderate: Secondary | ICD-10-CM | POA: Diagnosis not present

## 2015-06-12 ENCOUNTER — Other Ambulatory Visit: Payer: Self-pay | Admitting: Family Medicine

## 2015-06-30 DIAGNOSIS — F411 Generalized anxiety disorder: Secondary | ICD-10-CM | POA: Diagnosis not present

## 2015-06-30 DIAGNOSIS — F331 Major depressive disorder, recurrent, moderate: Secondary | ICD-10-CM | POA: Diagnosis not present

## 2015-06-30 DIAGNOSIS — F900 Attention-deficit hyperactivity disorder, predominantly inattentive type: Secondary | ICD-10-CM | POA: Diagnosis not present

## 2015-06-30 DIAGNOSIS — E039 Hypothyroidism, unspecified: Secondary | ICD-10-CM | POA: Diagnosis not present

## 2015-07-27 ENCOUNTER — Other Ambulatory Visit: Payer: Self-pay | Admitting: Family Medicine

## 2015-08-04 ENCOUNTER — Encounter: Payer: Self-pay | Admitting: Family Medicine

## 2015-08-04 ENCOUNTER — Ambulatory Visit (INDEPENDENT_AMBULATORY_CARE_PROVIDER_SITE_OTHER): Payer: BLUE CROSS/BLUE SHIELD | Admitting: Family Medicine

## 2015-08-04 VITALS — BP 112/76 | HR 72 | Temp 98.6°F | Ht 62.0 in | Wt 191.0 lb

## 2015-08-04 DIAGNOSIS — E538 Deficiency of other specified B group vitamins: Secondary | ICD-10-CM

## 2015-08-04 DIAGNOSIS — E039 Hypothyroidism, unspecified: Secondary | ICD-10-CM

## 2015-08-04 DIAGNOSIS — Z209 Contact with and (suspected) exposure to unspecified communicable disease: Secondary | ICD-10-CM | POA: Diagnosis not present

## 2015-08-04 DIAGNOSIS — Z0000 Encounter for general adult medical examination without abnormal findings: Secondary | ICD-10-CM | POA: Diagnosis not present

## 2015-08-04 DIAGNOSIS — C187 Malignant neoplasm of sigmoid colon: Secondary | ICD-10-CM | POA: Diagnosis not present

## 2015-08-04 DIAGNOSIS — Z23 Encounter for immunization: Secondary | ICD-10-CM

## 2015-08-04 DIAGNOSIS — E119 Type 2 diabetes mellitus without complications: Secondary | ICD-10-CM

## 2015-08-04 DIAGNOSIS — E669 Obesity, unspecified: Secondary | ICD-10-CM

## 2015-08-04 DIAGNOSIS — E785 Hyperlipidemia, unspecified: Secondary | ICD-10-CM

## 2015-08-04 LAB — CBC WITH DIFFERENTIAL/PLATELET
Basophils Absolute: 0 10*3/uL (ref 0.0–0.1)
Basophils Relative: 0.6 % (ref 0.0–3.0)
EOS PCT: 2.1 % (ref 0.0–5.0)
Eosinophils Absolute: 0.2 10*3/uL (ref 0.0–0.7)
HEMATOCRIT: 37.3 % (ref 36.0–46.0)
Hemoglobin: 12.3 g/dL (ref 12.0–15.0)
LYMPHS ABS: 3.4 10*3/uL (ref 0.7–4.0)
LYMPHS PCT: 40.3 % (ref 12.0–46.0)
MCHC: 32.8 g/dL (ref 30.0–36.0)
MCV: 89.8 fl (ref 78.0–100.0)
MONOS PCT: 3.8 % (ref 3.0–12.0)
Monocytes Absolute: 0.3 10*3/uL (ref 0.1–1.0)
NEUTROS PCT: 53.2 % (ref 43.0–77.0)
Neutro Abs: 4.5 10*3/uL (ref 1.4–7.7)
Platelets: 278 10*3/uL (ref 150.0–400.0)
RBC: 4.16 Mil/uL (ref 3.87–5.11)
RDW: 13.9 % (ref 11.5–15.5)
WBC: 8.4 10*3/uL (ref 4.0–10.5)

## 2015-08-04 LAB — COMPREHENSIVE METABOLIC PANEL
ALBUMIN: 4.3 g/dL (ref 3.5–5.2)
ALT: 21 U/L (ref 0–35)
AST: 21 U/L (ref 0–37)
Alkaline Phosphatase: 62 U/L (ref 39–117)
BILIRUBIN TOTAL: 0.5 mg/dL (ref 0.2–1.2)
BUN: 14 mg/dL (ref 6–23)
CALCIUM: 9.9 mg/dL (ref 8.4–10.5)
CO2: 30 meq/L (ref 19–32)
Chloride: 103 mEq/L (ref 96–112)
Creatinine, Ser: 0.72 mg/dL (ref 0.40–1.20)
GFR: 90.18 mL/min (ref >60.00)
Glucose, Bld: 82 mg/dL (ref 70–99)
POTASSIUM: 3.9 meq/L (ref 3.5–5.1)
Sodium: 137 mEq/L (ref 135–145)
TOTAL PROTEIN: 6.8 g/dL (ref 6.0–8.3)

## 2015-08-04 LAB — LIPID PANEL
CHOL/HDL RATIO: 3
Cholesterol: 152 mg/dL (ref 0–200)
HDL: 48 mg/dL (ref >39.00)
LDL CALC: 80 mg/dL (ref 0–99)
NONHDL: 103.58
Triglycerides: 118 mg/dL (ref 0.0–149.0)
VLDL: 23.6 mg/dL (ref 0.0–40.0)

## 2015-08-04 LAB — TSH: TSH: 1.15 u[IU]/mL (ref 0.35–4.50)

## 2015-08-04 LAB — VITAMIN B12: Vitamin B-12: 205 pg/mL — ABNORMAL LOW (ref 211–911)

## 2015-08-04 LAB — HEMOGLOBIN A1C: Hgb A1c MFr Bld: 6.4 % (ref 4.6–6.5)

## 2015-08-04 NOTE — Progress Notes (Signed)
Pre visit review using our clinic review tool, if applicable. No additional management support is needed unless otherwise documented below in the visit note. 

## 2015-08-04 NOTE — Patient Instructions (Signed)
Tdap vaccine today  Labs today  Don't forget a flu shot in the fall Let's see how B12 level is before making a plan from that  Don't forget your eye exam next month   Follow up in 6 months

## 2015-08-04 NOTE — Progress Notes (Signed)
Subjective:    Patient ID: Kelli Cruz, female    DOB: 1960-07-08, 55 y.o.   MRN: [id number]  HPI Here for health maintenance exam and to review chronic medical problems  Doing ok - working a lot   She turned her ankle and fell in May- could not move it well for 3 days  It improved but knee still hurts and feels tight posteriorly  Has a brace to wear  Used ice initially  Took ibuprofen- it bothered her stomach and she could not eat  Some swelling initially with bruising-not severe  Worse when going up and down steps   Wt Readings from Last 3 Encounters:  08/04/15 191 lb (86.637 kg)  02/03/15 202 lb 12 oz (91.967 kg)  01/06/15 202 lb (91.627 kg)     Wt is down 9 lb ! Lost more and then gained a few lb back   Hep C/HIV screening - not high risk , but would like to screen   Mammogram - with gyn Dr Gaetano Net - 9/16 neg Self exam- no lumps   Pap 8/16-gyn   Colonoscopy 9/15- hx of colon cancer in the past (sigmoid)    Td 11/06- wants to do that today  Eye exam 7/16- will make appt for next month  Flu shot 10/16  pna vaccine 10/12  Overdue for B12 shot (last shot in Jan) Lab Results  Component Value Date   VITAMINB12 600 02/03/2015  ENT - disc poss of high B12 causing burning mouth  She has geographic tongue  Using a mouth guard  sensidyne helps    Due for labs   DM2- good blood sugars at home for the most part  Lab Results  Component Value Date   HGBA1C 6.4 02/03/2015  on metformin  Glipizide Ace for renal protection   Hypothyroidism  Pt has no clinical changes besides a little fatigue  No change in energy level/ hair or skin/ edema and no tremor Lab Results  Component Value Date   TSH 1.35 08/12/2014     Hx of hyperlipidemia Lab Results  Component Value Date   CHOL 150 02/03/2015   CHOL 153 08/12/2014   CHOL 134 11/28/2012   Lab Results  Component Value Date   HDL 40.80 02/03/2015   HDL 41.50 08/12/2014   HDL 40.60 11/28/2012   Lab  Results  Component Value Date   LDLCALC 84 02/03/2015   LDLCALC 91 08/12/2014   [place] 70 11/28/2012   Lab Results  Component Value Date   TRIG 126.0 02/03/2015   TRIG 103.0 08/12/2014   TRIG 117.0 11/28/2012   Lab Results  Component Value Date   CHOLHDL 4 02/03/2015   CHOLHDL 4 08/12/2014   CHOLHDL 3 11/28/2012   Lab Results  Component Value Date   LDLDIRECT 147.2 12/11/2008   LDLDIRECT 121.9 09/07/2008   LDLDIRECT 148.0 06/08/2008   zocor and diet  Does not expect changes   Mood has been fairly stable  Still adjusting medicines with her psychiatrist   Patient Active Problem List   Diagnosis Date Noted  . Exposure to communicable disease 08/04/2015  . Obesity 02/04/2015  . Hypersomnia with sleep apnea 01/06/2015  . Insomnia due to mental condition   . Burning sensation of the foot 08/12/2014  . Burning sensation of mouth 08/12/2014  . Numbness 10/24/2011  . Routine general medical examination at a health care facility 10/24/2011  . TMJ arthralgia 08/14/2011  . Cervical pain 07/26/2011  . Pain in thoracic  spine 07/26/2011  . Lumbar pain 07/26/2011  . Muscle pain 07/26/2011  . Poor posture 07/26/2011  . Diarrhea 08/25/2010  . Blurred vision 05/31/2010  . Speech abnormality 05/31/2010  . B12 deficiency 04/13/2010  . ADENOCARCINOMA, SIGMOID COLON 08/11/2008  . Hyperlipidemia 07/08/2008  . Hypothyroidism 09/12/2006  . Diabetes type 2, controlled ([place]) 09/12/2006  . ANXIETY 09/12/2006  . DEPRESSION 09/12/2006  . ALLERGIC RHINITIS, SEASONAL 09/12/2006  . FIBROCYSTIC BREAST DISEASE 09/12/2006  . MIGRAINES, HX OF 09/12/2006   Past Medical History  Diagnosis Date  . Anxiety   . Depression   . Diabetes mellitus     type II  . Hypothyroidism   . Endometriosis   . Ganglion cyst   . Bipolar 1 disorder ([place])   . Family history of malignant neoplasm of gastrointestinal tract   . Insomnia due to mental condition    Past Surgical History  Procedure Laterality  Date  . Laparoscopy  1997    D & C for endometriosis  . Dilation and curettage of uterus  2006    miscarriage   . Abdominal hysterectomy    . Knee dislocation surgery    . Ganglion cyst excision    . Colon resection  June 2010  . Colon surgery    . Colonoscopy    . Hernia repair    . Abdominal hysterectomy    . Interstim implant placement Left 12/11/2012    stimulator is on the left but the electrodes go to the right   Social History  Substance Use Topics  . Smoking status: Passive Smoke Exposure - Never Smoker  . Smokeless tobacco: Never Used     Comment: husband quit smoking  . Alcohol Use: 0.0 oz/week    0 Standard drinks or equivalent per week     Comment: socially twice a month at most or less   Family History  Problem Relation Age of Onset  . Breast cancer Mother   . Diabetes Mother   . Coronary artery disease Mother   . Kidney disease Mother     renal insufficiency  . Hyperlipidemia Mother   . Diabetes Father   . Coronary artery disease Father   . Colon cancer Father   . Colon cancer Paternal Grandmother   . Breast cancer Maternal Aunt   . Breast cancer Paternal Aunt    Allergies  Allergen Reactions  . Hydrocodone     REACTION: nausea  . Milk-Related Compounds Nausea And Vomiting   Current Outpatient Prescriptions on File Prior to Visit  Medication Sig Dispense Refill  . acetaminophen (TYLENOL) 325 MG tablet Take 650 mg by mouth every 6 (six) hours as needed. For pain    . ALPRAZolam (XANAX) 0.5 MG tablet Take 0.5 mg by mouth at bedtime as needed. For anxiety    . amphetamine-dextroamphetamine (ADDERALL XR) 30 MG 24 hr capsule Take 30 mg by mouth every morning.    Marland Kitchen asenapine (SAPHRIS) 5 MG SUBL 24 hr tablet 5 mg 1 day or 1 dose. Takes for depression    . citalopram (CELEXA) 40 MG tablet Take 40 mg by mouth daily.     . cyanocobalamin (,VITAMIN B-12,) 1000 MCG/ML injection Inject 1,000 mcg into the muscle. every 8 weeks    . Diabetic Sterile Lancets MISC  by Does not apply route. Check glucose two times a day and as needed for out of control DM 250.00     . ESTRADIOL VA Place vaginally 2 (two) times a week. Reported on  02/03/2015    . gabapentin (NEURONTIN) 300 MG capsule Take 300 mg by mouth 2 (two) times daily.    Marland Kitchen GLIPIZIDE XL 5 MG 24 hr tablet TAKE 1 TABLET DAILY 90 tablet 0  . glucose blood test strip 1 each by Other route. Check glucose two times a day and as needed for out of control DM 250.00     . ibuprofen (ADVIL,MOTRIN) 200 MG tablet Take 400 mg by mouth as directed. For pain    . levothyroxine (SYNTHROID, LEVOTHROID) 88 MCG tablet TAKE 1 TABLET DAILY 90 tablet 0  . lisinopril (PRINIVIL,ZESTRIL) 5 MG tablet TAKE ONE-HALF (1/2) TABLET DAILY 45 tablet 0  . magic mouthwash w/lidocaine SOLN Take 5 mLs by mouth 3 (three) times daily. 150 mL 5  . metFORMIN (GLUCOPHAGE) 1000 MG tablet TAKE 1 TABLET TWICE A DAY WITH MEALS 180 tablet 0  . simvastatin (ZOCOR) 20 MG tablet TAKE 1 TABLET AT BEDTIME 90 tablet 0  . zolpidem (AMBIEN) 5 MG tablet Take 5 mg by mouth at bedtime as needed. For sleep    . [DISCONTINUED] cholestyramine (QUESTRAN) 4 G packet Mix with juice or water and drink every 1-3 days. 30 each 1   No current facility-administered medications on file prior to visit.     Review of Systems Review of Systems  Constitutional: Negative for fever, appetite change, fatigue and unexpected weight change.  ENT pos for chronic burning mouth-no ST Eyes: Negative for pain and visual disturbance.  Respiratory: Negative for cough and shortness of breath.   Cardiovascular: Negative for cp or palpitations    Gastrointestinal: Negative for nausea, diarrhea and constipation.  Genitourinary: Negative for urgency and frequency.  Skin: Negative for pallor or rash   Neurological: Negative for weakness, light-headedness, numbness and headaches.  Hematological: Negative for adenopathy. Does not bruise/bleed easily.  Psychiatric/Behavioral: Negative for  dysphoric mood. The patient is not nervous/anxious.         Objective:   Physical Exam  Constitutional: She appears well-developed and well-nourished. No distress.  obese and well appearing   HENT:  Head: Normocephalic and atraumatic.  Right Ear: External ear normal.  Left Ear: External ear normal.  Nose: Nose normal.  Mouth/Throat: Oropharynx is clear and moist.  Eyes: Conjunctivae and EOM are normal. Pupils are equal, round, and reactive to light. Right eye exhibits no discharge. Left eye exhibits no discharge. No scleral icterus.  Neck: Normal range of motion. Neck supple. No JVD present. Carotid bruit is not present. No thyromegaly present.  Cardiovascular: Normal rate, regular rhythm, normal heart sounds and intact distal pulses.  Exam reveals no gallop.   Pulmonary/Chest: Effort normal and breath sounds normal. No respiratory distress. She has no wheezes. She has no rales.  Abdominal: Soft. Bowel sounds are normal. She exhibits no distension and no mass. There is no tenderness.  Musculoskeletal: She exhibits no edema or tenderness.  Nl rom both ankles without edema  Lymphadenopathy:    She has no cervical adenopathy.  Neurological: She is alert. She has normal reflexes. No cranial nerve deficit. She exhibits normal muscle tone. Coordination normal.  Skin: Skin is warm and dry. No rash noted. No erythema. No pallor.  Psychiatric: She has a normal mood and affect.  Brighter affect today          Assessment & Plan:   Problem List Items Addressed This Visit      Digestive   B12 deficiency    Unsure if high level in the past contrib  to burning mouth symptoms Level today Then adv re: shots  None since jan currently       Relevant Orders   Vitamin B12 (Completed)   ADENOCARCINOMA, SIGMOID COLON - Primary    Doing well - /GI follows for periodic monitoring         Endocrine   Hypothyroidism    TSH today  No clinical changes       Diabetes type 2, controlled  (H[facility])    A1C today  Diet and exercise are improved Commended on wt loss Pt will make her own opth appt for next month      Relevant Orders   Hemoglobin A1c (Completed)     Other   Routine general medical examination at a health care facility    Reviewed health habits including diet and exercise and skin cancer prevention Reviewed appropriate screening tests for age  Also reviewed health mt list, fam hx and immunization status , as well as social and family history    See HPI Labs today Tdap vaccine today  Labs today  Don't forget a flu shot in the fall Let's see how B12 level is before making a plan from that  Don't forget your eye exam next month       Relevant Orders   CBC with Differential/Platelet (Completed)   Comprehensive metabolic panel (Completed)   TSH (Completed)   Lipid panel (Completed)   Obesity    In pt with DM2 Discussed how this problem influences overall health and the risks it imposes  Reviewed plan for weight loss with lower calorie diet (via better food choices and also portion control or program like weight watchers) and exercise building up to or more than 30 minutes 5 days per week including some aerobic activity   Commended on good work so far       Hyperlipidemia    Disc goals for lipids and reasons to control them Rev labs with pt (last check) On statin  Lab today Rev low sat fat diet in detail       Exposure to communicable disease    Screening for HIV and hep C today      Relevant Orders   HIV antibody (with reflex) (Completed)   Hepatitis C antibody (Completed)    Other Visit Diagnoses    Need for Tdap vaccination        Relevant Orders    Tdap vaccine greater than or equal to 7yo IM (Completed)

## 2015-08-05 LAB — HEPATITIS C ANTIBODY: HCV Ab: NEGATIVE

## 2015-08-05 LAB — HIV ANTIBODY (ROUTINE TESTING W REFLEX): HIV: NONREACTIVE

## 2015-08-05 NOTE — Assessment & Plan Note (Signed)
Disc goals for lipids and reasons to control them Rev labs with pt (last check) On statin  Lab today Rev low sat fat diet in detail

## 2015-08-05 NOTE — Assessment & Plan Note (Signed)
A1C today  Diet and exercise are improved Commended on wt loss Pt will make her own opth appt for next month

## 2015-08-05 NOTE — Assessment & Plan Note (Signed)
TSH today  No clinical changes  

## 2015-08-05 NOTE — Assessment & Plan Note (Signed)
Unsure if high level in the past contrib to burning mouth symptoms Level today Then adv re: shots  None since jan currently

## 2015-08-05 NOTE — Assessment & Plan Note (Signed)
Screening for HIV and hep C today

## 2015-08-05 NOTE — Assessment & Plan Note (Signed)
Reviewed health habits including diet and exercise and skin cancer prevention Reviewed appropriate screening tests for age  Also reviewed health mt list, fam hx and immunization status , as well as social and family history    See HPI Labs today Tdap vaccine today  Labs today  Don't forget a flu shot in the fall Let's see how B12 level is before making a plan from that  Don't forget your eye exam next month

## 2015-08-05 NOTE — Assessment & Plan Note (Signed)
In pt with DM2 Discussed how this problem influences overall health and the risks it imposes  Reviewed plan for weight loss with lower calorie diet (via better food choices and also portion control or program like weight watchers) and exercise building up to or more than 30 minutes 5 days per week including some aerobic activity   Commended on good work so far

## 2015-08-05 NOTE — Assessment & Plan Note (Signed)
Doing well - /GI follows for periodic monitoring

## 2015-08-09 DIAGNOSIS — F900 Attention-deficit hyperactivity disorder, predominantly inattentive type: Secondary | ICD-10-CM | POA: Diagnosis not present

## 2015-08-09 DIAGNOSIS — F331 Major depressive disorder, recurrent, moderate: Secondary | ICD-10-CM | POA: Diagnosis not present

## 2015-08-09 DIAGNOSIS — F411 Generalized anxiety disorder: Secondary | ICD-10-CM | POA: Diagnosis not present

## 2015-08-12 LAB — HM DIABETES EYE EXAM

## 2015-08-25 DIAGNOSIS — F331 Major depressive disorder, recurrent, moderate: Secondary | ICD-10-CM | POA: Diagnosis not present

## 2015-08-25 DIAGNOSIS — E039 Hypothyroidism, unspecified: Secondary | ICD-10-CM | POA: Diagnosis not present

## 2015-08-25 DIAGNOSIS — F411 Generalized anxiety disorder: Secondary | ICD-10-CM | POA: Diagnosis not present

## 2015-09-22 ENCOUNTER — Telehealth: Payer: Self-pay

## 2015-09-22 ENCOUNTER — Other Ambulatory Visit: Payer: Self-pay

## 2015-09-22 DIAGNOSIS — F900 Attention-deficit hyperactivity disorder, predominantly inattentive type: Secondary | ICD-10-CM | POA: Diagnosis not present

## 2015-09-22 DIAGNOSIS — E039 Hypothyroidism, unspecified: Secondary | ICD-10-CM | POA: Diagnosis not present

## 2015-09-22 DIAGNOSIS — F331 Major depressive disorder, recurrent, moderate: Secondary | ICD-10-CM | POA: Diagnosis not present

## 2015-09-22 DIAGNOSIS — F411 Generalized anxiety disorder: Secondary | ICD-10-CM | POA: Diagnosis not present

## 2015-09-22 NOTE — Telephone Encounter (Signed)
Pt is requesting refills to be sent to [facility].

## 2015-09-22 NOTE — Telephone Encounter (Signed)
Pt is calling for B12 shot rx to be sent to [facility].

## 2015-09-23 NOTE — Telephone Encounter (Signed)
Order sent ([facility] Phone [phone], fax [phone])

## 2015-10-13 DIAGNOSIS — F900 Attention-deficit hyperactivity disorder, predominantly inattentive type: Secondary | ICD-10-CM | POA: Diagnosis not present

## 2015-10-13 DIAGNOSIS — F411 Generalized anxiety disorder: Secondary | ICD-10-CM | POA: Diagnosis not present

## 2015-10-13 DIAGNOSIS — F331 Major depressive disorder, recurrent, moderate: Secondary | ICD-10-CM | POA: Diagnosis not present

## 2015-10-13 DIAGNOSIS — E039 Hypothyroidism, unspecified: Secondary | ICD-10-CM | POA: Diagnosis not present

## 2015-11-04 DIAGNOSIS — Z1231 Encounter for screening mammogram for malignant neoplasm of breast: Secondary | ICD-10-CM | POA: Diagnosis not present

## 2015-11-04 DIAGNOSIS — Z6833 Body mass index (BMI) 33.0-33.9, adult: Secondary | ICD-10-CM | POA: Diagnosis not present

## 2015-11-04 DIAGNOSIS — Z01419 Encounter for gynecological examination (general) (routine) without abnormal findings: Secondary | ICD-10-CM | POA: Diagnosis not present

## 2015-11-08 DIAGNOSIS — F411 Generalized anxiety disorder: Secondary | ICD-10-CM | POA: Diagnosis not present

## 2015-11-08 DIAGNOSIS — F900 Attention-deficit hyperactivity disorder, predominantly inattentive type: Secondary | ICD-10-CM | POA: Diagnosis not present

## 2015-11-08 DIAGNOSIS — F331 Major depressive disorder, recurrent, moderate: Secondary | ICD-10-CM | POA: Diagnosis not present

## 2015-11-12 ENCOUNTER — Other Ambulatory Visit: Payer: Self-pay | Admitting: Family Medicine

## 2015-11-15 LAB — HM MAMMOGRAPHY: HM Mammogram: NORMAL (ref 0–4)

## 2015-11-16 ENCOUNTER — Other Ambulatory Visit: Payer: Self-pay

## 2015-11-16 MED ORDER — CYANOCOBALAMIN 1000 MCG/ML IJ SOLN: 1000.0000 ug | INTRAMUSCULAR | 11 refills | Status: DC

## 2015-11-16 NOTE — Telephone Encounter (Signed)
Pt left v/m; the prescription for Vit B 12 was not received at doctors office in [place] back in 07/2015. Pt request rx sent to her verified home address [address] [id number].

## 2015-11-16 NOTE — Telephone Encounter (Signed)
Rx mailed.

## 2015-11-16 NOTE — Telephone Encounter (Signed)
I printed it to mail

## 2015-12-02 DIAGNOSIS — F331 Major depressive disorder, recurrent, moderate: Secondary | ICD-10-CM | POA: Diagnosis not present

## 2015-12-02 DIAGNOSIS — F411 Generalized anxiety disorder: Secondary | ICD-10-CM | POA: Diagnosis not present

## 2015-12-02 DIAGNOSIS — E039 Hypothyroidism, unspecified: Secondary | ICD-10-CM | POA: Diagnosis not present

## 2015-12-02 DIAGNOSIS — F900 Attention-deficit hyperactivity disorder, predominantly inattentive type: Secondary | ICD-10-CM | POA: Diagnosis not present

## 2015-12-03 DIAGNOSIS — F331 Major depressive disorder, recurrent, moderate: Secondary | ICD-10-CM | POA: Diagnosis not present

## 2015-12-22 DIAGNOSIS — F331 Major depressive disorder, recurrent, moderate: Secondary | ICD-10-CM | POA: Diagnosis not present

## 2015-12-22 DIAGNOSIS — E039 Hypothyroidism, unspecified: Secondary | ICD-10-CM | POA: Diagnosis not present

## 2015-12-22 DIAGNOSIS — F900 Attention-deficit hyperactivity disorder, predominantly inattentive type: Secondary | ICD-10-CM | POA: Diagnosis not present

## 2015-12-22 DIAGNOSIS — F411 Generalized anxiety disorder: Secondary | ICD-10-CM | POA: Diagnosis not present

## 2016-01-19 DIAGNOSIS — E039 Hypothyroidism, unspecified: Secondary | ICD-10-CM | POA: Diagnosis not present

## 2016-01-19 DIAGNOSIS — F900 Attention-deficit hyperactivity disorder, predominantly inattentive type: Secondary | ICD-10-CM | POA: Diagnosis not present

## 2016-01-19 DIAGNOSIS — F331 Major depressive disorder, recurrent, moderate: Secondary | ICD-10-CM | POA: Diagnosis not present

## 2016-01-19 DIAGNOSIS — F411 Generalized anxiety disorder: Secondary | ICD-10-CM | POA: Diagnosis not present

## 2016-02-04 ENCOUNTER — Ambulatory Visit: Payer: BLUE CROSS/BLUE SHIELD | Admitting: Family Medicine

## 2016-02-18 ENCOUNTER — Encounter: Payer: Self-pay | Admitting: Family Medicine

## 2016-02-18 ENCOUNTER — Ambulatory Visit (INDEPENDENT_AMBULATORY_CARE_PROVIDER_SITE_OTHER): Payer: BLUE CROSS/BLUE SHIELD | Admitting: Family Medicine

## 2016-02-18 VITALS — BP 112/68 | HR 99 | Temp 98.7°F | Ht 62.0 in | Wt 190.2 lb

## 2016-02-18 DIAGNOSIS — R809 Proteinuria, unspecified: Secondary | ICD-10-CM | POA: Insufficient documentation

## 2016-02-18 DIAGNOSIS — C187 Malignant neoplasm of sigmoid colon: Secondary | ICD-10-CM

## 2016-02-18 DIAGNOSIS — E538 Deficiency of other specified B group vitamins: Secondary | ICD-10-CM

## 2016-02-18 DIAGNOSIS — R202 Paresthesia of skin: Secondary | ICD-10-CM

## 2016-02-18 DIAGNOSIS — E119 Type 2 diabetes mellitus without complications: Secondary | ICD-10-CM

## 2016-02-18 DIAGNOSIS — E039 Hypothyroidism, unspecified: Secondary | ICD-10-CM

## 2016-02-18 DIAGNOSIS — E7800 Pure hypercholesterolemia, unspecified: Secondary | ICD-10-CM | POA: Diagnosis not present

## 2016-02-18 LAB — LIPID PANEL
Cholesterol: 189 mg/dL (ref <200)
HDL: 47 mg/dL — ABNORMAL LOW (ref >50)
LDL CALC: 118 mg/dL — AB (ref <100)
TRIGLYCERIDES: 119 mg/dL (ref <150)
Total CHOL/HDL Ratio: 4 Ratio (ref <5.0)
VLDL: 24 mg/dL (ref <30)

## 2016-02-18 LAB — HEMOGLOBIN A1C
Hgb A1c MFr Bld: 6 % — ABNORMAL HIGH (ref <5.7)
Mean Plasma Glucose: 126 mg/dL

## 2016-02-18 LAB — VITAMIN B12: VITAMIN B 12: 314 pg/mL (ref 200–1100)

## 2016-02-18 MED ORDER — CYANOCOBALAMIN 1000 MCG/ML IJ SOLN
1000.0000 ug | Freq: Once | INTRAMUSCULAR | Status: AC
  Administered 2016-02-18: 1000 ug via INTRAMUSCULAR

## 2016-02-18 NOTE — Progress Notes (Signed)
Pre visit review using our clinic review tool, if applicable. No additional management support is needed unless otherwise documented below in the visit note. 

## 2016-02-18 NOTE — Patient Instructions (Addendum)
Keep working on [employer] and exercise for weight loss Labs today  B12 shot today  Make sure you drink enough water - goal of at least 64 oz per day   Take care of yourself

## 2016-02-18 NOTE — Progress Notes (Signed)
Subjective:    Patient ID: Kelli Cruz, female    DOB: 10-05-1960, 56 y.o.   MRN: [id number]  HPI  Here for f/u of chronic health problems   Feeling ok   Wt Readings from Last 3 Encounters:  02/18/16 190 lb 4 oz (86.3 kg)  08/04/15 191 lb (86.6 kg)  02/03/15 202 lb 12 oz (92 kg)  down another lb  Some stress eating -esp over the holidays (lost some and gained it back)  Has a wt loss program and contest at work  bmi of 34.8  Had labs in [place] (some) Cbc nl  Renal panel nl Had protein in urine - her urologist sent her to nephrologist    Mental health -newest med is Vraylar 3 mg  More stress lately with the holidays   Exercise-was using bike and [place] machine until she hurt her knee  Will get back to it   Hypothyroidism  Pt has no clinical changes No change in energy level/ hair or skin/ edema and no tremor Lab Results  Component Value Date   TSH 1.15 08/04/2015     Had flu shot in sept   Hx of B12 def Lab Results  Component Value Date   VITAMINB12 205 (L) 08/04/2015   Desire B12 shot today Last shot 2.5 months ago  Hard to get into her clinic to get it    Hx of hyperlipidemia On simvastatin  Lab Results  Component Value Date   CHOL 152 08/04/2015   HDL 48.00 08/04/2015   LDLCALC 80 08/04/2015   LDLDIRECT 147.2 12/11/2008   TRIG 118.0 08/04/2015   CHOLHDL 3 08/04/2015    Hx of DM2 Due for a1c Lab Results  Component Value Date   HGBA1C 6.4 08/04/2015  thinks she is stable  No highs or lows   Is fasting today   Has R knee problems  She has had this since a fall and a 2nd fall  A little better today  Using some ice   Review of Systems Review of Systems  Constitutional: Negative for fever, appetite change, fatigue and unexpected weight change.  ENT pos for chronic burning mouth Eyes: Negative for pain and visual disturbance.  Respiratory: Negative for cough and shortness of breath.   Cardiovascular: Negative for cp or palpitations     Gastrointestinal: Negative for nausea, diarrhea and constipation.  Genitourinary: Negative for urgency and frequency.  Skin: Negative for pallor or rash   MSK pos for R knee pain  Neurological: Negative for weakness, light-headedness, numbness and headaches. pos for tingling of extremities  Hematological: Negative for adenopathy. Does not bruise/bleed easily.  Psychiatric/Behavioral: Negative for dysphoric mood. The patient is not nervous/anxious.         Objective:   Physical Exam  Constitutional: She appears well-developed and well-nourished. No distress.  obese and well appearing   HENT:  Head: Normocephalic and atraumatic.  Mouth/Throat: Oropharynx is clear and moist.  Eyes: Conjunctivae and EOM are normal. Pupils are equal, round, and reactive to light.  Neck: Normal range of motion. Neck supple. No JVD present. Carotid bruit is not present. No thyromegaly present.  Cardiovascular: Normal rate, regular rhythm, normal heart sounds and intact distal pulses.  Exam reveals no gallop.   Pulmonary/Chest: Effort normal and breath sounds normal. No respiratory distress. She has no wheezes. She has no rales.  No crackles  Abdominal: Soft. Bowel sounds are normal. She exhibits no distension, no abdominal bruit and no mass. There is no  tenderness.  Musculoskeletal: She exhibits no edema or tenderness.  Lymphadenopathy:    She has no cervical adenopathy.  Neurological: She is alert. She has normal reflexes.  Skin: Skin is warm and dry. No rash noted.  Psychiatric: She has a normal mood and affect.          Assessment & Plan:   Problem List Items Addressed This Visit      Digestive   ADENOCARCINOMA, SIGMOID COLON    Doing well without recurrence        Endocrine   Diabetes type 2, controlled ([place])    A1C today  Doing well  Suspect stable  No high or low readings Counseled on wt loss/ exercise and low glycemic diet       Relevant Orders   Hemoglobin A1c (Completed)     Hypothyroidism - Primary    Hypothyroidism  Pt has no clinical changes No change in energy level/ hair or skin/ edema and no tremor Lab Results  Component Value Date   TSH 1.15 08/04/2015            Other   B12 deficiency    Low level last check- due for shot  Re check level on current supplementation and then give inj      Relevant Medications   cyanocobalamin ((VITAMIN B-12)) injection 1,000 mcg (Completed)   Other Relevant Orders   Vitamin B12 (Completed)   Hyperlipidemia    Disc goals for lipids and reasons to control them Rev labs with pt (last visit0 Rev low sat fat diet in detail  Re check today On simvastatin       Relevant Orders   Lipid panel (Completed)   Proteinuria    Seeing renal specialist in [facility] for this  She is asymptomatic       Tingling    Tingling of upper and lower extremities and burning mouth as well  Has seen neurology  Will change her gabapentin to [place] for her next px

## 2016-02-18 NOTE — Assessment & Plan Note (Signed)
Tingling of upper and lower extremities and burning mouth as well  Has seen neurology  Will change her gabapentin to [place] for her next px

## 2016-02-19 NOTE — Assessment & Plan Note (Signed)
A1C today  Doing well  Suspect stable  No high or low readings Counseled on wt loss/ exercise and low glycemic diet

## 2016-02-19 NOTE — Assessment & Plan Note (Signed)
Hypothyroidism  Pt has no clinical changes No change in energy level/ hair or skin/ edema and no tremor Lab Results  Component Value Date   TSH 1.15 08/04/2015

## 2016-02-19 NOTE — Assessment & Plan Note (Signed)
Seeing renal specialist in [place] for this  She is asymptomatic

## 2016-02-19 NOTE — Assessment & Plan Note (Signed)
Disc goals for lipids and reasons to control them Rev labs with pt (last visit0 Rev low sat fat diet in detail  Re check today On simvastatin

## 2016-02-19 NOTE — Assessment & Plan Note (Signed)
Doing well without recurrence.

## 2016-02-19 NOTE — Assessment & Plan Note (Signed)
Low level last check- due for shot  Re check level on current supplementation and then give inj

## 2016-02-19 NOTE — Assessment & Plan Note (Signed)
Discussed how this problem influences overall health and the risks it imposes  Reviewed plan for weight loss with lower calorie diet (via better food choices and also portion control or program like weight watchers) and exercise building up to or more than 30 minutes 5 days per week including some aerobic activity    

## 2016-02-21 ENCOUNTER — Encounter: Payer: Self-pay | Admitting: *Deleted

## 2016-02-22 ENCOUNTER — Encounter: Payer: Self-pay | Admitting: *Deleted

## 2016-03-22 DIAGNOSIS — E039 Hypothyroidism, unspecified: Secondary | ICD-10-CM | POA: Diagnosis not present

## 2016-03-22 DIAGNOSIS — F331 Major depressive disorder, recurrent, moderate: Secondary | ICD-10-CM | POA: Diagnosis not present

## 2016-03-22 DIAGNOSIS — F411 Generalized anxiety disorder: Secondary | ICD-10-CM | POA: Diagnosis not present

## 2016-03-22 DIAGNOSIS — F900 Attention-deficit hyperactivity disorder, predominantly inattentive type: Secondary | ICD-10-CM | POA: Diagnosis not present

## 2016-04-13 DIAGNOSIS — F900 Attention-deficit hyperactivity disorder, predominantly inattentive type: Secondary | ICD-10-CM | POA: Diagnosis not present

## 2016-04-13 DIAGNOSIS — F411 Generalized anxiety disorder: Secondary | ICD-10-CM | POA: Diagnosis not present

## 2016-04-13 DIAGNOSIS — F331 Major depressive disorder, recurrent, moderate: Secondary | ICD-10-CM | POA: Diagnosis not present

## 2016-04-27 DIAGNOSIS — E039 Hypothyroidism, unspecified: Secondary | ICD-10-CM | POA: Diagnosis not present

## 2016-04-27 DIAGNOSIS — F331 Major depressive disorder, recurrent, moderate: Secondary | ICD-10-CM | POA: Diagnosis not present

## 2016-04-27 DIAGNOSIS — F900 Attention-deficit hyperactivity disorder, predominantly inattentive type: Secondary | ICD-10-CM | POA: Diagnosis not present

## 2016-04-27 DIAGNOSIS — F411 Generalized anxiety disorder: Secondary | ICD-10-CM | POA: Diagnosis not present

## 2016-07-05 DIAGNOSIS — F900 Attention-deficit hyperactivity disorder, predominantly inattentive type: Secondary | ICD-10-CM | POA: Diagnosis not present

## 2016-07-05 DIAGNOSIS — F331 Major depressive disorder, recurrent, moderate: Secondary | ICD-10-CM | POA: Diagnosis not present

## 2016-07-05 DIAGNOSIS — F411 Generalized anxiety disorder: Secondary | ICD-10-CM | POA: Diagnosis not present

## 2016-07-05 DIAGNOSIS — E039 Hypothyroidism, unspecified: Secondary | ICD-10-CM | POA: Diagnosis not present

## 2016-08-09 DIAGNOSIS — E039 Hypothyroidism, unspecified: Secondary | ICD-10-CM | POA: Diagnosis not present

## 2016-08-09 DIAGNOSIS — F900 Attention-deficit hyperactivity disorder, predominantly inattentive type: Secondary | ICD-10-CM | POA: Diagnosis not present

## 2016-08-09 DIAGNOSIS — F331 Major depressive disorder, recurrent, moderate: Secondary | ICD-10-CM | POA: Diagnosis not present

## 2016-08-09 DIAGNOSIS — F411 Generalized anxiety disorder: Secondary | ICD-10-CM | POA: Diagnosis not present

## 2016-08-18 ENCOUNTER — Ambulatory Visit (INDEPENDENT_AMBULATORY_CARE_PROVIDER_SITE_OTHER): Payer: BLUE CROSS/BLUE SHIELD | Admitting: Family Medicine

## 2016-08-18 ENCOUNTER — Encounter: Payer: Self-pay | Admitting: Family Medicine

## 2016-08-18 VITALS — BP 112/70 | HR 71 | Temp 98.3°F | Ht 62.0 in | Wt 188.5 lb

## 2016-08-18 DIAGNOSIS — E538 Deficiency of other specified B group vitamins: Secondary | ICD-10-CM

## 2016-08-18 DIAGNOSIS — E6609 Other obesity due to excess calories: Secondary | ICD-10-CM

## 2016-08-18 DIAGNOSIS — F418 Other specified anxiety disorders: Secondary | ICD-10-CM | POA: Diagnosis not present

## 2016-08-18 DIAGNOSIS — E039 Hypothyroidism, unspecified: Secondary | ICD-10-CM

## 2016-08-18 DIAGNOSIS — E7800 Pure hypercholesterolemia, unspecified: Secondary | ICD-10-CM | POA: Diagnosis not present

## 2016-08-18 DIAGNOSIS — E119 Type 2 diabetes mellitus without complications: Secondary | ICD-10-CM | POA: Diagnosis not present

## 2016-08-18 DIAGNOSIS — Z6834 Body mass index (BMI) 34.0-34.9, adult: Secondary | ICD-10-CM

## 2016-08-18 LAB — CBC WITH DIFFERENTIAL/PLATELET
BASOS ABS: 0 {cells}/uL (ref 0–200)
BASOS PCT: 0 %
EOS ABS: 182 {cells}/uL (ref 15–500)
Eosinophils Relative: 2 %
HEMATOCRIT: 35.1 % (ref 35.0–45.0)
Hemoglobin: 11.1 g/dL — ABNORMAL LOW (ref 11.7–15.5)
LYMPHS PCT: 34 %
Lymphs Abs: 3094 cells/uL (ref 850–3900)
MCH: 27.3 pg (ref 27.0–33.0)
MCHC: 31.6 g/dL — ABNORMAL LOW (ref 32.0–36.0)
MCV: 86.2 fL (ref 80.0–100.0)
MONO ABS: 364 {cells}/uL (ref 200–950)
MPV: 9.5 fL (ref 7.5–12.5)
Monocytes Relative: 4 %
NEUTROS PCT: 60 %
Neutro Abs: 5460 cells/uL (ref 1500–7800)
Platelets: 370 10*3/uL (ref 140–400)
RBC: 4.07 MIL/uL (ref 3.80–5.10)
RDW: 15.5 % — AB (ref 11.0–15.0)
WBC: 9.1 10*3/uL (ref 3.8–10.8)

## 2016-08-18 LAB — COMPREHENSIVE METABOLIC PANEL
ALT: 10 U/L (ref 6–29)
AST: 17 U/L (ref 10–35)
Albumin: 4.2 g/dL (ref 3.6–5.1)
Alkaline Phosphatase: 79 U/L (ref 33–130)
BUN: 14 mg/dL (ref 7–25)
CALCIUM: 9.5 mg/dL (ref 8.6–10.4)
CHLORIDE: 99 mmol/L (ref 98–110)
CO2: 25 mmol/L (ref 20–31)
Creat: 0.81 mg/dL (ref 0.50–1.05)
GLUCOSE: 76 mg/dL (ref 65–99)
POTASSIUM: 4.1 mmol/L (ref 3.5–5.3)
Sodium: 138 mmol/L (ref 135–146)
TOTAL PROTEIN: 7 g/dL (ref 6.1–8.1)
Total Bilirubin: 0.6 mg/dL (ref 0.2–1.2)

## 2016-08-18 LAB — LIPID PANEL
CHOL/HDL RATIO: 3.2 ratio (ref <5.0)
CHOLESTEROL: 152 mg/dL (ref <200)
HDL: 48 mg/dL — AB (ref >50)
LDL Cholesterol: 77 mg/dL (ref <100)
Triglycerides: 136 mg/dL (ref <150)
VLDL: 27 mg/dL (ref <30)

## 2016-08-18 LAB — TSH: TSH: 1.58 mIU/L

## 2016-08-18 MED ORDER — GLIPIZIDE ER 5 MG PO TB24: 5.0000 mg | ORAL_TABLET | Freq: Every day | ORAL | 3 refills | Status: DC

## 2016-08-18 MED ORDER — LISINOPRIL 5 MG PO TABS: 2.5000 mg | ORAL_TABLET | Freq: Every day | ORAL | 3 refills | Status: DC

## 2016-08-18 MED ORDER — METFORMIN HCL 1000 MG PO TABS: 1000.0000 mg | ORAL_TABLET | Freq: Two times a day (BID) | ORAL | 3 refills | Status: DC

## 2016-08-18 MED ORDER — CYANOCOBALAMIN 1000 MCG/ML IJ SOLN: 1000.0000 ug | INTRAMUSCULAR | 11 refills | Status: DC

## 2016-08-18 MED ORDER — CYANOCOBALAMIN 1000 MCG/ML IJ SOLN
1000.0000 ug | Freq: Once | INTRAMUSCULAR | Status: AC
  Administered 2016-08-18: 1000 ug via INTRAMUSCULAR

## 2016-08-18 MED ORDER — SIMVASTATIN 20 MG PO TABS: 20.0000 mg | ORAL_TABLET | Freq: Every day | ORAL | 3 refills | Status: DC

## 2016-08-18 MED ORDER — LEVOTHYROXINE SODIUM 88 MCG PO TABS: 88.0000 ug | ORAL_TABLET | Freq: Every day | ORAL | 3 refills | Status: DC

## 2016-08-18 NOTE — Assessment & Plan Note (Signed)
Discussed how this problem influences overall health and the risks it imposes  Reviewed plan for weight loss with lower calorie diet (via better food choices and also portion control or program like weight watchers) and exercise building up to or more than 30 minutes 5 days per week including some aerobic activity    

## 2016-08-18 NOTE — Assessment & Plan Note (Signed)
Pt has not been able to get B12 shots  Px written for her clinic where she lives  B12 shot today  Level drawn before shot-suspect low

## 2016-08-18 NOTE — Progress Notes (Signed)
Subjective:    Patient ID: Kelli Cruz, female    DOB: Sep 19, 1960, 56 y.o.   MRN: [id number]  HPI  Here for f/u of chronic medical problems   Doing ok  Depression is doing a lot better overall - she is on vraylar now - had to cut dose to every other day  Takes ambien occ Also saphris   Wt Readings from Last 3 Encounters:  08/18/16 188 lb 8 oz (85.5 kg)  02/18/16 190 lb 4 oz (86.3 kg)  08/04/15 191 lb (86.6 kg)  wt is down 2lb  Not as much exercise- but wants to get back to it / she has a recumbent bike and treadmill and [employer]  Also a member of [employer] 34.4  Diet is fair  She eats cheese crackers a lot  Take out food too much  Fast food also   DM2 Lab Results  Component Value Date   HGBA1C 6.0 (H) 02/18/2016   eye exam nl 7/16 On ace for renal protection Takes glipizide and metformin  Due for labs -suspects stable  occ gets low blood sugar symptoms -much improved from previous   Went to nephrology for proteinuria  It was fairly low - just monitoring   Hypothyroidism  Pt has no clinical changes No change in energy level/ hair or skin/ edema and no tremor Lab Results  Component Value Date   TSH 1.15 08/04/2015      Hx of hyperlipidemia Lab Results  Component Value Date   CHOL 189 02/18/2016   HDL 47 (L) 02/18/2016   LDLCALC 118 (H) 02/18/2016   LDLDIRECT 147.2 12/11/2008   TRIG 119 02/18/2016   CHOLHDL 4.0 02/18/2016   Simvastatin and diet  Diet has not been great for cholesterol (take out and fast food)   Hx of B12 def Lab Results  Component Value Date   VITAMINB12 314 02/18/2016   Has only had one B12 shot  Her clinic is having difficulty getting the orders   Patient Active Problem List   Diagnosis Date Noted  . Proteinuria 02/18/2016  . Exposure to communicable disease 08/04/2015  . Obesity 02/04/2015  . Hypersomnia with sleep apnea 01/06/2015  . Insomnia due to mental condition   . Tingling 08/12/2014  . Burning sensation  of mouth 08/12/2014  . Numbness 10/24/2011  . Routine general medical examination at a health care facility 10/24/2011  . TMJ arthralgia 08/14/2011  . Poor posture 07/26/2011  . B12 deficiency 04/13/2010  . ADENOCARCINOMA, SIGMOID COLON 08/11/2008  . Hyperlipidemia 07/08/2008  . Hypothyroidism 09/12/2006  . Diabetes type 2, controlled ([place]) 09/12/2006  . Depression with anxiety 09/12/2006  . ALLERGIC RHINITIS, SEASONAL 09/12/2006  . FIBROCYSTIC BREAST DISEASE 09/12/2006  . MIGRAINES, HX OF 09/12/2006   Past Medical History:  Diagnosis Date  . Anxiety   . Bipolar 1 disorder ([place])   . Depression   . Diabetes mellitus    type II  . Endometriosis   . Family history of malignant neoplasm of gastrointestinal tract   . Ganglion cyst   . Hypothyroidism   . Insomnia due to mental condition    Past Surgical History:  Procedure Laterality Date  . ABDOMINAL HYSTERECTOMY    . ABDOMINAL HYSTERECTOMY    . COLON RESECTION  June 2010  . COLON SURGERY    . COLONOSCOPY    . DILATION AND CURETTAGE OF UTERUS  2006   miscarriage   . GANGLION CYST EXCISION    .  HERNIA REPAIR    . INTERSTIM IMPLANT PLACEMENT Left 12/11/2012   stimulator is on the left but the electrodes go to the right  . KNEE DISLOCATION SURGERY    . LAPAROSCOPY  1997   D & C for endometriosis   Social History  Substance Use Topics  . Smoking status: Passive Smoke Exposure - Never Smoker  . Smokeless tobacco: Never Used     Comment: husband quit smoking  . Alcohol use 0.0 oz/week     Comment: socially twice a month at most or less   Family History  Problem Relation Age of Onset  . Breast cancer Mother   . Diabetes Mother   . Coronary artery disease Mother   . Kidney disease Mother        renal insufficiency  . Hyperlipidemia Mother   . Diabetes Father   . Coronary artery disease Father   . Colon cancer Father   . Colon cancer Paternal Grandmother   . Breast cancer Maternal Aunt   . Breast cancer Paternal  Aunt    Allergies  Allergen Reactions  . Hydrocodone     REACTION: nausea  . Milk-Related Compounds Nausea And Vomiting   Current Outpatient Prescriptions on File Prior to Visit  Medication Sig Dispense Refill  . acetaminophen (TYLENOL) 325 MG tablet Take 650 mg by mouth every 6 (six) hours as needed. For pain    . ALPRAZolam (XANAX) 0.5 MG tablet Take 0.5 mg by mouth at bedtime as needed. For anxiety    . amphetamine-dextroamphetamine (ADDERALL XR) 30 MG 24 hr capsule Take 30 mg by mouth every morning.    Marland Kitchen asenapine (SAPHRIS) 5 MG SUBL 24 hr tablet 5 mg 1 day or 1 dose. Takes for depression    . citalopram (CELEXA) 40 MG tablet Take 40 mg by mouth daily.     . Diabetic Sterile Lancets MISC by Does not apply route. Check glucose two times a day and as needed for out of control DM 250.00     . ESTRADIOL VA Place vaginally 2 (two) times a week. Reported on 02/03/2015    . gabapentin (NEURONTIN) 300 MG capsule Take 300 mg by mouth 2 (two) times daily.    Marland Kitchen glucose blood test strip 1 each by Other route. Check glucose two times a day and as needed for out of control DM 250.00     . ibuprofen (ADVIL,MOTRIN) 200 MG tablet Take 400 mg by mouth as directed. For pain    . magic mouthwash w/lidocaine SOLN Take 5 mLs by mouth 3 (three) times daily. 150 mL 5  . VRAYLAR 3 MG CAPS Take 1 capsule by mouth daily.    Marland Kitchen zolpidem (AMBIEN) 5 MG tablet Take 5 mg by mouth at bedtime as needed. For sleep    . [DISCONTINUED] cholestyramine (QUESTRAN) 4 G packet Mix with juice or water and drink every 1-3 days. 30 each 1   No current facility-administered medications on file prior to visit.      Review of Systems    Review of Systems  Constitutional: Negative for fever, appetite change, and unexpected weight change. pos for intermittent fatigue and long work hours  Eyes: Negative for pain and visual disturbance.  Respiratory: Negative for cough and shortness of breath.   Cardiovascular: Negative for cp or  palpitations    Gastrointestinal: Negative for nausea, diarrhea and constipation.  Genitourinary: Negative for urgency and frequency.  Skin: Negative for pallor or rash   Neurological: Negative for  weakness, light-headedness, numbness and headaches.  Hematological: Negative for adenopathy. Does not bruise/bleed easily.  Psychiatric/Behavioral: pos for anxiety and depression that is improved       Objective:   Physical Exam  Constitutional: She appears well-developed and well-nourished. No distress.  obese and well appearing   HENT:  Head: Normocephalic and atraumatic.  Mouth/Throat: Oropharynx is clear and moist.  Eyes: Conjunctivae and EOM are normal. Pupils are equal, round, and reactive to light.  Neck: Normal range of motion. Neck supple. No JVD present. Carotid bruit is not present. No thyromegaly present.  Cardiovascular: Normal rate, regular rhythm, normal heart sounds and intact distal pulses.  Exam reveals no gallop.   Pulmonary/Chest: Effort normal and breath sounds normal. No respiratory distress. She has no wheezes. She has no rales.  No crackles  Abdominal: Soft. Bowel sounds are normal. She exhibits no distension, no abdominal bruit and no mass. There is no tenderness.  Musculoskeletal: She exhibits no edema or tenderness.  Lymphadenopathy:    She has no cervical adenopathy.  Neurological: She is alert. She has normal reflexes.  Skin: Skin is warm and dry. No rash noted.  Psychiatric: She has a normal mood and affect.          Assessment & Plan:   Problem List Items Addressed This Visit      Endocrine   Diabetes type 2, controlled ([place]) - Primary    A1C today Formerly 6.0-but pt is eating out/fast food  Suspect this will be up  On glipizide and metformin Disc diet and exercise and need for wt loss  She is due for eye exam-will schedule that herself  Has been eval by nephrologist for proteinuria On ace      Relevant Medications   glipiZIDE (GLUCOTROL  XL) 5 MG 24 hr tablet   lisinopril (PRINIVIL,ZESTRIL) 5 MG tablet   metFORMIN (GLUCOPHAGE) 1000 MG tablet   simvastatin (ZOCOR) 20 MG tablet   Other Relevant Orders   Hemoglobin A1c   Hypothyroidism    TSH today No clinical changes  Wt is down 2 lb       Relevant Medications   levothyroxine (SYNTHROID, LEVOTHROID) 88 MCG tablet   Other Relevant Orders   TSH     Other   B12 deficiency    Pt has not been able to get B12 shots  Px written for her clinic where she lives  B12 shot today  Level drawn before shot-suspect low      Relevant Medications   cyanocobalamin ((VITAMIN B-12)) injection 1,000 mcg (Completed)   Other Relevant Orders   CBC with Differential/Platelet   Vitamin B12   Depression with anxiety    Under psychiatric care  Doing better currently      Hyperlipidemia    Lipid panel today  On simvastatin-not to goal last time Suspect LDL will be up based on diet Disc goals for lipids and reasons to control them Rev labs with pt Rev low sat fat diet in detail        Relevant Medications   lisinopril (PRINIVIL,ZESTRIL) 5 MG tablet   simvastatin (ZOCOR) 20 MG tablet   Other Relevant Orders   Comprehensive metabolic panel   Lipid panel   Obesity    Discussed how this problem influences overall health and the risks it imposes  Reviewed plan for weight loss with lower calorie diet (via better food choices and also portion control or program like weight watchers) and exercise building up to or more than  30 minutes 5 days per week including some aerobic activity         Relevant Medications   glipiZIDE (GLUCOTROL XL) 5 MG 24 hr tablet   metFORMIN (GLUCOPHAGE) 1000 MG tablet

## 2016-08-18 NOTE — Assessment & Plan Note (Signed)
A1C today Formerly 6.0-but pt is eating out/fast food  Suspect this will be up  On glipizide and metformin Disc diet and exercise and need for wt loss  She is due for eye exam-will schedule that herself  Has been eval by nephrologist for proteinuria On ace

## 2016-08-18 NOTE — Assessment & Plan Note (Signed)
Under care of psychiatrist Doing better overall

## 2016-08-18 NOTE — Assessment & Plan Note (Signed)
Lipid panel today  On simvastatin-not to goal last time Suspect LDL will be up based on diet Disc goals for lipids and reasons to control them Rev labs with pt Rev low sat fat diet in detail

## 2016-08-18 NOTE — Patient Instructions (Addendum)
Don't forget to get your annual eye exam - you can schedule that   B12 shot today  Labs today   Try to get your B12 shots every 2 months- here is an order   Try to stick to a healthier diet  Less eating out and fast food   Aim for exercise 5 days per week- different kinds  Use your bike  Check out water aerobics and swimming

## 2016-08-18 NOTE — Assessment & Plan Note (Signed)
TSH today No clinical changes  Wt is down 2 lb

## 2016-08-18 NOTE — Assessment & Plan Note (Addendum)
Under psychiatric care  Doing better currently

## 2016-08-19 LAB — VITAMIN B12: Vitamin B-12: 273 pg/mL (ref 200–1100)

## 2016-08-19 LAB — HEMOGLOBIN A1C
HEMOGLOBIN A1C: 6.1 % — AB (ref <5.7)
Mean Plasma Glucose: 128 mg/dL

## 2016-08-21 ENCOUNTER — Telehealth: Payer: Self-pay

## 2016-08-21 NOTE — Telephone Encounter (Signed)
Pt left v/m at 4:42 ; pt concerned because last time had labs pt was asked if she had any bleeding; pt had not seen any bleeding. Pt went to nephrology clinic and pt was advised iron was really low at 8. Pt has to go for five iron infusions and wanted Dr Glori Bickers to be aware and should pt be concerned. Pt request cb.

## 2016-08-21 NOTE — Telephone Encounter (Signed)
Please send for her nephrology note= it sounds like she is not absorbing iron and that may be the problem Get the iron infusions as planned You can cancel the heme cards  Thanks

## 2016-08-22 NOTE — Telephone Encounter (Signed)
Pt notified of Dr. Marliss Coots comments and verbalized understanding. OV and lab request sent to Dr. Lindalou Hose with [facility]

## 2016-08-24 DIAGNOSIS — F411 Generalized anxiety disorder: Secondary | ICD-10-CM | POA: Diagnosis not present

## 2016-08-24 DIAGNOSIS — F900 Attention-deficit hyperactivity disorder, predominantly inattentive type: Secondary | ICD-10-CM | POA: Diagnosis not present

## 2016-08-24 DIAGNOSIS — F331 Major depressive disorder, recurrent, moderate: Secondary | ICD-10-CM | POA: Diagnosis not present

## 2016-11-10 ENCOUNTER — Telehealth: Payer: Self-pay | Admitting: Family Medicine

## 2016-11-10 DIAGNOSIS — G8929 Other chronic pain: Secondary | ICD-10-CM

## 2016-11-10 DIAGNOSIS — M25561 Pain in right knee: Secondary | ICD-10-CM | POA: Insufficient documentation

## 2016-11-10 NOTE — Telephone Encounter (Signed)
PT called to request a referral for knee pain that was previously discussed at [place]. Please call with any questions. She said her knee pain is getting worse.

## 2016-11-10 NOTE — Telephone Encounter (Signed)
Appt made with Dr Mardelle Matte and patient aware.

## 2016-11-10 NOTE — Telephone Encounter (Signed)
Ref done  Will route to [facility] 

## 2016-11-20 DIAGNOSIS — M1711 Unilateral primary osteoarthritis, right knee: Secondary | ICD-10-CM | POA: Diagnosis not present

## 2016-11-22 DIAGNOSIS — F331 Major depressive disorder, recurrent, moderate: Secondary | ICD-10-CM | POA: Diagnosis not present

## 2016-11-22 DIAGNOSIS — F900 Attention-deficit hyperactivity disorder, predominantly inattentive type: Secondary | ICD-10-CM | POA: Diagnosis not present

## 2016-11-22 DIAGNOSIS — F411 Generalized anxiety disorder: Secondary | ICD-10-CM | POA: Diagnosis not present

## 2016-11-22 DIAGNOSIS — E039 Hypothyroidism, unspecified: Secondary | ICD-10-CM | POA: Diagnosis not present

## 2016-12-18 ENCOUNTER — Telehealth: Payer: Self-pay | Admitting: Family Medicine

## 2016-12-18 MED ORDER — GABAPENTIN 300 MG PO CAPS: 300.0000 mg | ORAL_CAPSULE | Freq: Two times a day (BID) | ORAL | 3 refills | Status: DC

## 2016-12-18 MED ORDER — GABAPENTIN 300 MG PO CAPS: 300.0000 mg | ORAL_CAPSULE | Freq: Two times a day (BID) | ORAL | 1 refills | Status: DC

## 2016-12-18 NOTE — Telephone Encounter (Signed)
Copied from [place] [id number]. Topic: Inquiry >> Dec 18, 2016  1:10 PM Neva Seat wrote: Pt was prescribed  Gabapentin by her specialist.  Pt doesn't see the specialist and wants Dr. Glori Bickers to have the Rx refilled ASAP.  Call to patient- she was seeing Dr Shearon Stalls and was Rx'd Gabapentin 300 mg for tingling in arms and legs. She was prescribed the medication to use 3 times a day- but she only needed 2 times a day to control her symptoms. She would like Dr tower to prescribe and she states Dr tower is aware that she is using it. She uses [facility] and would like a 90 day Rx if approved. Told patient her request would be forwarded and she would be notified about her providers decision.

## 2016-12-18 NOTE — Telephone Encounter (Signed)
Copied from [place] [id number]. Topic: Inquiry >> Dec 18, 2016  1:10 PM Neva Seat wrote: Pt was prescribed  Gabapentin by her specialist.  Pt doesn't see the specialist and wants Dr. Glori Bickers to have the Rx refilled ASAP.
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
## 2016-12-18 NOTE — Telephone Encounter (Signed)
Last office visit: 08/18/16

## 2016-12-18 NOTE — Telephone Encounter (Signed)
Px written for call in   Unsure which pharmacy  Please also ask which medical problem she takes this for Thanks

## 2016-12-18 NOTE — Telephone Encounter (Signed)
That answers my prev question   Px written for call in   Thanks

## 2016-12-18 NOTE — Telephone Encounter (Signed)
See attached

## 2016-12-20 DIAGNOSIS — F900 Attention-deficit hyperactivity disorder, predominantly inattentive type: Secondary | ICD-10-CM | POA: Diagnosis not present

## 2016-12-20 DIAGNOSIS — F411 Generalized anxiety disorder: Secondary | ICD-10-CM | POA: Diagnosis not present

## 2016-12-20 DIAGNOSIS — E039 Hypothyroidism, unspecified: Secondary | ICD-10-CM | POA: Diagnosis not present

## 2016-12-20 DIAGNOSIS — F331 Major depressive disorder, recurrent, moderate: Secondary | ICD-10-CM | POA: Diagnosis not present

## 2017-01-08 DIAGNOSIS — F900 Attention-deficit hyperactivity disorder, predominantly inattentive type: Secondary | ICD-10-CM | POA: Diagnosis not present

## 2017-01-08 DIAGNOSIS — F331 Major depressive disorder, recurrent, moderate: Secondary | ICD-10-CM | POA: Diagnosis not present

## 2017-01-08 DIAGNOSIS — F411 Generalized anxiety disorder: Secondary | ICD-10-CM | POA: Diagnosis not present

## 2017-01-08 DIAGNOSIS — M1711 Unilateral primary osteoarthritis, right knee: Secondary | ICD-10-CM | POA: Diagnosis not present

## 2017-01-17 DIAGNOSIS — F411 Generalized anxiety disorder: Secondary | ICD-10-CM | POA: Diagnosis not present

## 2017-01-17 DIAGNOSIS — E039 Hypothyroidism, unspecified: Secondary | ICD-10-CM | POA: Diagnosis not present

## 2017-01-17 DIAGNOSIS — F331 Major depressive disorder, recurrent, moderate: Secondary | ICD-10-CM | POA: Diagnosis not present

## 2017-01-17 DIAGNOSIS — F900 Attention-deficit hyperactivity disorder, predominantly inattentive type: Secondary | ICD-10-CM | POA: Diagnosis not present

## 2017-01-25 ENCOUNTER — Encounter: Payer: Self-pay | Admitting: Family Medicine

## 2017-01-25 DIAGNOSIS — N390 Urinary tract infection, site not specified: Secondary | ICD-10-CM | POA: Diagnosis not present

## 2017-01-25 DIAGNOSIS — Z6833 Body mass index (BMI) 33.0-33.9, adult: Secondary | ICD-10-CM | POA: Diagnosis not present

## 2017-01-25 DIAGNOSIS — Z1231 Encounter for screening mammogram for malignant neoplasm of breast: Secondary | ICD-10-CM | POA: Diagnosis not present

## 2017-01-25 DIAGNOSIS — Z01419 Encounter for gynecological examination (general) (routine) without abnormal findings: Secondary | ICD-10-CM | POA: Diagnosis not present

## 2017-01-31 DIAGNOSIS — F411 Generalized anxiety disorder: Secondary | ICD-10-CM | POA: Diagnosis not present

## 2017-01-31 DIAGNOSIS — F331 Major depressive disorder, recurrent, moderate: Secondary | ICD-10-CM | POA: Diagnosis not present

## 2017-01-31 DIAGNOSIS — F900 Attention-deficit hyperactivity disorder, predominantly inattentive type: Secondary | ICD-10-CM | POA: Diagnosis not present

## 2017-02-19 ENCOUNTER — Ambulatory Visit: Payer: BLUE CROSS/BLUE SHIELD | Admitting: Family Medicine

## 2017-02-19 ENCOUNTER — Encounter: Payer: Self-pay | Admitting: Family Medicine

## 2017-02-19 VITALS — BP 122/64 | HR 77 | Temp 98.2°F | Ht 62.0 in | Wt 186.5 lb

## 2017-02-19 DIAGNOSIS — C187 Malignant neoplasm of sigmoid colon: Secondary | ICD-10-CM | POA: Diagnosis not present

## 2017-02-19 DIAGNOSIS — D649 Anemia, unspecified: Secondary | ICD-10-CM | POA: Diagnosis not present

## 2017-02-19 DIAGNOSIS — E6609 Other obesity due to excess calories: Secondary | ICD-10-CM | POA: Diagnosis not present

## 2017-02-19 DIAGNOSIS — E559 Vitamin D deficiency, unspecified: Secondary | ICD-10-CM | POA: Diagnosis not present

## 2017-02-19 DIAGNOSIS — E039 Hypothyroidism, unspecified: Secondary | ICD-10-CM | POA: Diagnosis not present

## 2017-02-19 DIAGNOSIS — E119 Type 2 diabetes mellitus without complications: Secondary | ICD-10-CM | POA: Diagnosis not present

## 2017-02-19 DIAGNOSIS — E538 Deficiency of other specified B group vitamins: Secondary | ICD-10-CM

## 2017-02-19 DIAGNOSIS — Z6834 Body mass index (BMI) 34.0-34.9, adult: Secondary | ICD-10-CM | POA: Diagnosis not present

## 2017-02-19 DIAGNOSIS — E7800 Pure hypercholesterolemia, unspecified: Secondary | ICD-10-CM | POA: Diagnosis not present

## 2017-02-19 MED ORDER — CYANOCOBALAMIN 1000 MCG/ML IJ SOLN
1000.0000 ug | Freq: Once | INTRAMUSCULAR | Status: AC
  Administered 2017-02-19: 1000 ug via INTRAMUSCULAR

## 2017-02-19 NOTE — Assessment & Plan Note (Signed)
Disc goals for lipids and reasons to control them Rev labs with pt  (last check) Lipid panel today with statin and diet  Rev low sat fat diet in detail

## 2017-02-19 NOTE — Patient Instructions (Addendum)
Get back to healthy diet and the stationary bike   Get away from drinking soda   Don't forget to get your eye exam- have them send [place] a copy   B12 shot today   Labs today   Take care of yourself

## 2017-02-19 NOTE — Assessment & Plan Note (Signed)
A1C today  Suspect stable  Disc eye and foot care  Ace for renal protection  Lipid check also today-on statin  Wt loss enc

## 2017-02-19 NOTE — Assessment & Plan Note (Signed)
Mild with last check  Cbc with ferritin today Hx of colon cancer  Hx of ckd

## 2017-02-19 NOTE — Progress Notes (Signed)
Subjective:    Patient ID: Kelli Cruz, female    DOB: 03/23/1960, 57 y.o.   MRN: [id number]  HPI  Here for f/u of chronic health problems   Feeling well overall  She works a lot - needs some vacation time    [facility] Readings from Last 3 Encounters:  02/19/17 186 lb 8 oz (84.6 kg)  08/18/16 188 lb 8 oz (85.5 kg)  02/18/16 190 lb 4 oz (86.3 kg)  had lost 13 lb (down to 173) but then gained back over holidays  Getting back to weight loss with better diet / no snacks and no pm snacks  Had to give up peanuts at bedtime  Not as much exercise as she wants to  34.11 kg/m   Went to the doctor for her knee - wants to start back with stationary bike   DM2 Lab Results  Component Value Date   HGBA1C 6.1 (H) 08/18/2016  will check today (she thinks it will be up a bit) - was eating sweets over the holidays   (also soda)  Due for labs  Takes glipizide and metformin Ace for renal protection  Eye exam - several years ago  She will make her own appt , no vision changes   Hyperlipidemia Lab Results  Component Value Date   CHOL 152 08/18/2016   HDL 48 (L) 08/18/2016   LDLCALC 77 08/18/2016   LDLDIRECT 147.2 12/11/2008   TRIG 136 08/18/2016   CHOLHDL 3.2 08/18/2016  simvastatin and diet  May be up a bit from holiday eating   Hypothyroidism  Pt has no clinical changes No change in energy level/ hair or skin/ edema and no tremor Lab Results  Component Value Date   TSH 1.58 08/18/2016     B12 def  Lab Results  Component Value Date   VITAMINB12 273 08/18/2016   Due for an injection today Last injection was 2 months ago (trying to stay on track for that)   slt anemia at last labs  No results found for: FERRITIN Lab Results  Component Value Date   WBC 9.1 08/18/2016   HGB 11.1 (L) 08/18/2016   HCT 35.1 08/18/2016   MCV 86.2 08/18/2016   PLT 370 08/18/2016  hx of colon cancer in the past  Was found to be vit D deficient  Given 50,000 iu twice weekly for 5 doses - from  her nephrologist  ? Or was it iron -not sure   Patient Active Problem List   Diagnosis Date Noted  . Anemia 02/19/2017  . Vitamin D deficiency 02/19/2017  . Knee pain, right 11/10/2016  . Proteinuria 02/18/2016  . Exposure to communicable disease 08/04/2015  . Obesity 02/04/2015  . Hypersomnia with sleep apnea 01/06/2015  . Insomnia due to mental condition   . Tingling 08/12/2014  . Burning sensation of mouth 08/12/2014  . Numbness 10/24/2011  . Routine general medical examination at a health care facility 10/24/2011  . TMJ arthralgia 08/14/2011  . Poor posture 07/26/2011  . B12 deficiency 04/13/2010  . ADENOCARCINOMA, SIGMOID COLON 08/11/2008  . Hyperlipidemia 07/08/2008  . Hypothyroidism 09/12/2006  . Diabetes type 2, controlled ([place]) 09/12/2006  . Depression with anxiety 09/12/2006  . ALLERGIC RHINITIS, SEASONAL 09/12/2006  . FIBROCYSTIC BREAST DISEASE 09/12/2006  . MIGRAINES, HX OF 09/12/2006   Past Medical History:  Diagnosis Date  . Anxiety   . Bipolar 1 disorder ([place])   . Depression   . Diabetes mellitus    type II  .  Endometriosis   . Family history of malignant neoplasm of gastrointestinal tract   . Ganglion cyst   . Hypothyroidism   . Insomnia due to mental condition    Past Surgical History:  Procedure Laterality Date  . ABDOMINAL HYSTERECTOMY    . ABDOMINAL HYSTERECTOMY    . COLON RESECTION  June 2010  . COLON SURGERY    . COLONOSCOPY    . DILATION AND CURETTAGE OF UTERUS  2006   miscarriage   . GANGLION CYST EXCISION    . HERNIA REPAIR    . INTERSTIM IMPLANT PLACEMENT Left 12/11/2012   stimulator is on the left but the electrodes go to the right  . KNEE DISLOCATION SURGERY    . LAPAROSCOPY  1997   D & C for endometriosis   Social History   Tobacco Use  . Smoking status: Passive Smoke Exposure - Never Smoker  . Smokeless tobacco: Never Used  . Tobacco comment: husband quit smoking  Substance Use Topics  . Alcohol use: Yes    Alcohol/week:  0.0 oz    Comment: socially twice a month at most or less  . Drug use: No   Family History  Problem Relation Age of Onset  . Breast cancer Mother   . Diabetes Mother   . Coronary artery disease Mother   . Kidney disease Mother        renal insufficiency  . Hyperlipidemia Mother   . Diabetes Father   . Coronary artery disease Father   . Colon cancer Father   . Colon cancer Paternal Grandmother   . Breast cancer Maternal Aunt   . Breast cancer Paternal Aunt    Allergies  Allergen Reactions  . Hydrocodone     REACTION: nausea  . Milk-Related Compounds Nausea And Vomiting   Current Outpatient Medications on File Prior to Visit  Medication Sig Dispense Refill  . acetaminophen (TYLENOL) 325 MG tablet Take 650 mg by mouth every 6 (six) hours as needed. For pain    . ALPRAZolam (XANAX) 0.5 MG tablet Take 0.5 mg by mouth at bedtime as needed. For anxiety    . amphetamine-dextroamphetamine (ADDERALL XR) 30 MG 24 hr capsule Take 30 mg by mouth every morning.    Marland Kitchen asenapine (SAPHRIS) 5 MG SUBL 24 hr tablet 5 mg 1 day or 1 dose. Takes for depression    . citalopram (CELEXA) 40 MG tablet Take 40 mg by mouth daily.     . cyanocobalamin (,VITAMIN B-12,) 1000 MCG/ML injection Inject 1 mL (1,000 mcg total) into the muscle every 8 (eight) weeks. every 8 weeks 1 mL 11  . Diabetic Sterile Lancets MISC by Does not apply route. Check glucose two times a day and as needed for out of control DM 250.00     . doxepin (SINEQUAN) 50 MG capsule Take 1 capsule by mouth at bedtime.  1  . ESTRADIOL VA Place vaginally 2 (two) times a week. Reported on 02/03/2015    . ESTRING 2 MG vaginal ring INSERT 1 RING VAGINALLY EVERY 3 MONTHS  3  . gabapentin (NEURONTIN) 300 MG capsule Take 1 capsule (300 mg total) 2 (two) times daily by mouth. 180 capsule 1  . glipiZIDE (GLUCOTROL XL) 5 MG 24 hr tablet Take 1 tablet (5 mg total) by mouth daily. 90 tablet 3  . glucose blood test strip 1 each by Other route. Check glucose  two times a day and as needed for out of control DM 250.00     .  ibuprofen (ADVIL,MOTRIN) 200 MG tablet Take 400 mg by mouth as directed. For pain    . levothyroxine (SYNTHROID, LEVOTHROID) 88 MCG tablet Take 1 tablet (88 mcg total) by mouth daily. 90 tablet 3  . lisinopril (PRINIVIL,ZESTRIL) 5 MG tablet Take 0.5 tablets (2.5 mg total) by mouth daily. 45 tablet 3  . magic mouthwash w/lidocaine SOLN Take 5 mLs by mouth 3 (three) times daily. 150 mL 5  . metFORMIN (GLUCOPHAGE) 1000 MG tablet Take 1 tablet (1,000 mg total) by mouth 2 (two) times daily with a meal. 180 tablet 3  . simvastatin (ZOCOR) 20 MG tablet Take 1 tablet (20 mg total) by mouth at bedtime. 90 tablet 3  . VRAYLAR 3 MG CAPS Take 1 capsule by mouth daily.    Marland Kitchen zolpidem (AMBIEN) 5 MG tablet Take 5 mg by mouth at bedtime as needed. For sleep    . [DISCONTINUED] cholestyramine (QUESTRAN) 4 G packet Mix with juice or water and drink every 1-3 days. 30 each 1   No current facility-administered medications on file prior to visit.      Review of Systems  Constitutional: Positive for fatigue. Negative for activity change, appetite change, fever and unexpected weight change.  HENT: Negative for congestion, ear pain, rhinorrhea, sinus pressure and sore throat.   Eyes: Negative for pain, redness and visual disturbance.  Respiratory: Negative for cough, shortness of breath and wheezing.   Cardiovascular: Negative for chest pain and palpitations.  Gastrointestinal: Negative for abdominal pain, blood in stool, constipation and diarrhea.  Endocrine: Negative for polydipsia and polyuria.  Genitourinary: Negative for dysuria, frequency and urgency.  Musculoskeletal: Negative for arthralgias, back pain and myalgias.  Skin: Negative for pallor and rash.  Allergic/Immunologic: Negative for environmental allergies.  Neurological: Negative for dizziness, syncope and headaches.  Hematological: Negative for adenopathy. Does not bruise/bleed easily.   Psychiatric/Behavioral: Negative for decreased concentration and dysphoric mood. The patient is not nervous/anxious.        Mood is stable       Objective:   Physical Exam  Constitutional: She appears well-developed and well-nourished. No distress.  obese and well appearing   HENT:  Head: Normocephalic and atraumatic.  Mouth/Throat: Oropharynx is clear and moist.  Eyes: Conjunctivae and EOM are normal. Pupils are equal, round, and reactive to light.  Neck: Normal range of motion. Neck supple. No JVD present. Carotid bruit is not present. No thyromegaly present.  Cardiovascular: Normal rate, regular rhythm, normal heart sounds and intact distal pulses. Exam reveals no gallop.  Pulmonary/Chest: Effort normal and breath sounds normal. No respiratory distress. She has no wheezes. She has no rales.  No crackles  Abdominal: Soft. Bowel sounds are normal. She exhibits no distension, no abdominal bruit and no mass. There is no tenderness.  Musculoskeletal: She exhibits no edema.  Lymphadenopathy:    She has no cervical adenopathy.  Neurological: She is alert. She has normal reflexes.  Skin: Skin is warm and dry. No rash noted. No pallor.  Psychiatric: Her affect is blunt.          Assessment & Plan:   Problem List Items Addressed This Visit      Digestive   ADENOCARCINOMA, SIGMOID COLON    Doing well  Re check cbc/mild anemia today No stool changes  Recall colonscopy 4/20        Endocrine   Diabetes type 2, controlled ([place])    A1C today  Suspect stable  Disc eye and foot care  Ace for renal  protection  Lipid check also today-on statin  Wt loss enc       Relevant Orders   Hemoglobin A1c   Hypothyroidism - Primary    Hypothyroidism  Pt has no clinical changes No change in energy level/ hair or skin/ edema and no tremor Lab Results  Component Value Date   TSH 1.58 08/18/2016             Other   Anemia    Mild with last check  Cbc with ferritin today Hx  of colon cancer  Hx of ckd       Relevant Medications   cyanocobalamin ((VITAMIN B-12)) injection 1,000 mcg (Completed)   Other Relevant Orders   CBC with Differential/Platelet   Ferritin   B12 deficiency    B12 injection today  Rev last labs      Relevant Medications   cyanocobalamin ((VITAMIN B-12)) injection 1,000 mcg (Completed)   Hyperlipidemia    Disc goals for lipids and reasons to control them Rev labs with pt  (last check) Lipid panel today with statin and diet  Rev low sat fat diet in detail       Relevant Orders   Comprehensive metabolic panel   Lipid panel   Obesity    Discussed how this problem influences overall health and the risks it imposes  Reviewed plan for weight loss with lower calorie diet (via better food choices and also portion control or program like weight watchers) and exercise building up to or more than 30 minutes 5 days per week including some aerobic activity         Vitamin D deficiency    D level today-per pt diagnosed by nephrology and she had high dose supplementation      Relevant Orders   VITAMIN D 25 Hydroxy (Vit-D Deficiency, Fractures)

## 2017-02-19 NOTE — Assessment & Plan Note (Signed)
D level today-per pt diagnosed by nephrology and she had high dose supplementation

## 2017-02-19 NOTE — Assessment & Plan Note (Signed)
Doing well  Re check cbc/mild anemia today No stool changes  Recall colonscopy 4/20

## 2017-02-19 NOTE — Assessment & Plan Note (Signed)
Hypothyroidism  Pt has no clinical changes No change in energy level/ hair or skin/ edema and no tremor Lab Results  Component Value Date   TSH 1.58 08/18/2016

## 2017-02-19 NOTE — Assessment & Plan Note (Signed)
Discussed how this problem influences overall health and the risks it imposes  Reviewed plan for weight loss with lower calorie diet (via better food choices and also portion control or program like weight watchers) and exercise building up to or more than 30 minutes 5 days per week including some aerobic activity    

## 2017-02-19 NOTE — Assessment & Plan Note (Signed)
B12 injection today  Rev last labs

## 2017-02-20 LAB — CBC WITH DIFFERENTIAL/PLATELET
BASOS ABS: 0.1 10*3/uL (ref 0.0–0.1)
BASOS PCT: 1.2 % (ref 0.0–3.0)
EOS ABS: 0.2 10*3/uL (ref 0.0–0.7)
Eosinophils Relative: 2.4 % (ref 0.0–5.0)
HEMATOCRIT: 37.7 % (ref 36.0–46.0)
HEMOGLOBIN: 12.3 g/dL (ref 12.0–15.0)
Lymphocytes Relative: 30.2 % (ref 12.0–46.0)
Lymphs Abs: 2.4 10*3/uL (ref 0.7–4.0)
MCHC: 32.7 g/dL (ref 30.0–36.0)
MCV: 97.5 fl (ref 78.0–100.0)
MONO ABS: 0.4 10*3/uL (ref 0.1–1.0)
Monocytes Relative: 5.1 % (ref 3.0–12.0)
Neutro Abs: 4.8 10*3/uL (ref 1.4–7.7)
Neutrophils Relative %: 61.1 % (ref 43.0–77.0)
Platelets: 311 10*3/uL (ref 150.0–400.0)
RBC: 3.87 Mil/uL (ref 3.87–5.11)
RDW: 14.7 % (ref 11.5–15.5)
WBC: 7.9 10*3/uL (ref 4.0–10.5)

## 2017-02-20 LAB — VITAMIN D 25 HYDROXY (VIT D DEFICIENCY, FRACTURES): VITD: 16.57 ng/mL — ABNORMAL LOW (ref 30.00–100.00)

## 2017-02-20 LAB — COMPREHENSIVE METABOLIC PANEL
ALBUMIN: 4.1 g/dL (ref 3.5–5.2)
ALT: 18 U/L (ref 0–35)
AST: 20 U/L (ref 0–37)
Alkaline Phosphatase: 73 U/L (ref 39–117)
BILIRUBIN TOTAL: 0.6 mg/dL (ref 0.2–1.2)
BUN: 21 mg/dL (ref 6–23)
CALCIUM: 9.4 mg/dL (ref 8.4–10.5)
CHLORIDE: 100 meq/L (ref 96–112)
CO2: 29 mEq/L (ref 19–32)
CREATININE: 1.08 mg/dL (ref 0.40–1.20)
GFR: 56.15 mL/min — ABNORMAL LOW (ref >60.00)
Glucose, Bld: 108 mg/dL — ABNORMAL HIGH (ref 70–99)
Potassium: 4.3 mEq/L (ref 3.5–5.1)
SODIUM: 137 meq/L (ref 135–145)
TOTAL PROTEIN: 7.2 g/dL (ref 6.0–8.3)

## 2017-02-20 LAB — LIPID PANEL
CHOLESTEROL: 203 mg/dL — AB (ref 0–200)
HDL: 48.2 mg/dL (ref >39.00)
LDL CALC: 124 mg/dL — AB (ref 0–99)
NonHDL: 154.73
TRIGLYCERIDES: 156 mg/dL — AB (ref 0.0–149.0)
Total CHOL/HDL Ratio: 4
VLDL: 31.2 mg/dL (ref 0.0–40.0)

## 2017-02-20 LAB — HEMOGLOBIN A1C: Hgb A1c MFr Bld: 6.8 % — ABNORMAL HIGH (ref 4.6–6.5)

## 2017-02-20 LAB — FERRITIN: Ferritin: 99.2 ng/mL (ref 10.0–291.0)

## 2017-02-21 DIAGNOSIS — M1711 Unilateral primary osteoarthritis, right knee: Secondary | ICD-10-CM | POA: Diagnosis not present

## 2017-02-21 DIAGNOSIS — E039 Hypothyroidism, unspecified: Secondary | ICD-10-CM | POA: Diagnosis not present

## 2017-02-21 DIAGNOSIS — F900 Attention-deficit hyperactivity disorder, predominantly inattentive type: Secondary | ICD-10-CM | POA: Diagnosis not present

## 2017-02-21 DIAGNOSIS — F331 Major depressive disorder, recurrent, moderate: Secondary | ICD-10-CM | POA: Diagnosis not present

## 2017-02-21 DIAGNOSIS — F411 Generalized anxiety disorder: Secondary | ICD-10-CM | POA: Diagnosis not present

## 2017-02-22 ENCOUNTER — Telehealth: Payer: Self-pay | Admitting: Family Medicine

## 2017-02-22 MED ORDER — ERGOCALCIFEROL 1.25 MG (50000 UT) PO CAPS: 50000.0000 [IU] | ORAL_CAPSULE | ORAL | 0 refills | Status: DC

## 2017-02-22 MED ORDER — ATORVASTATIN CALCIUM 10 MG PO TABS: 10.0000 mg | ORAL_TABLET | Freq: Every day | ORAL | 3 refills | Status: DC

## 2017-02-22 NOTE — Telephone Encounter (Signed)
-----   Message from Tammi Sou, [place] sent at 02/22/2017 12:14 PM EST ----- Pt notified of lab results and Dr. Marliss Coots comments. Pt said in the past last year she had to have a IV dose of vitamin D. But as of right now she isn't taking any vitamin D. Pt is opened to starting a cholesterol med and she uses the [facility] on [address] In [place]. Pt will watch diet since her A1c is up and her cholesterol was up too

## 2017-02-22 NOTE — Telephone Encounter (Signed)
I'm sending ergocalciferol (high dose D) take 1 weekly for 12 weeks  Also get 2000 iu vit D3 otc and take every day from now on   Atorvastatin 10 mg daily -take in the evening  I will send that  Please check fasting lipid/ast/alt in about 6 weeks

## 2017-02-23 NOTE — Telephone Encounter (Signed)
Pt advise Rxs sent to pharmacy and advise of Dr. Marliss Coots comments and fasting lab appt scheduled

## 2017-04-11 ENCOUNTER — Ambulatory Visit (INDEPENDENT_AMBULATORY_CARE_PROVIDER_SITE_OTHER): Payer: BLUE CROSS/BLUE SHIELD | Admitting: *Deleted

## 2017-04-11 ENCOUNTER — Other Ambulatory Visit (INDEPENDENT_AMBULATORY_CARE_PROVIDER_SITE_OTHER): Payer: BLUE CROSS/BLUE SHIELD

## 2017-04-11 ENCOUNTER — Telehealth: Payer: Self-pay | Admitting: *Deleted

## 2017-04-11 DIAGNOSIS — E538 Deficiency of other specified B group vitamins: Secondary | ICD-10-CM | POA: Diagnosis not present

## 2017-04-11 DIAGNOSIS — E7800 Pure hypercholesterolemia, unspecified: Secondary | ICD-10-CM | POA: Diagnosis not present

## 2017-04-11 MED ORDER — CYANOCOBALAMIN 1000 MCG/ML IJ SOLN
1000.0000 ug | Freq: Once | INTRAMUSCULAR | Status: AC
  Administered 2017-04-11: 1000 ug via INTRAMUSCULAR

## 2017-04-11 NOTE — Telephone Encounter (Signed)
Kelli Cruz came in today for her Vitamin B12 shot.  She is requesting that we mail her a written order to have her vitamin B12 injections done every 2 weeks.  She works in [place] and there is a clinic that will do them for her so she doesn't have to make the long drive here to get her injections.

## 2017-04-11 NOTE — Telephone Encounter (Signed)
See prev note I think last year you just wrote the order on your Rx pad

## 2017-04-12 LAB — LIPID PANEL
CHOL/HDL RATIO: 3
CHOLESTEROL: 170 mg/dL (ref 0–200)
HDL: 49.1 mg/dL (ref >39.00)
LDL Cholesterol: 100 mg/dL — ABNORMAL HIGH (ref 0–99)
NonHDL: 120.95
TRIGLYCERIDES: 104 mg/dL (ref 0.0–149.0)
VLDL: 20.8 mg/dL (ref 0.0–40.0)

## 2017-04-12 LAB — AST: AST: 17 U/L (ref 0–37)

## 2017-04-12 LAB — ALT: ALT: 15 U/L (ref 0–35)

## 2017-04-12 MED ORDER — CYANOCOBALAMIN 1000 MCG/ML IJ SOLN: 1000.0000 ug | INTRAMUSCULAR | 5 refills | Status: DC

## 2017-04-12 NOTE — Telephone Encounter (Signed)
Order mailed to pt.

## 2017-04-12 NOTE — Addendum Note (Signed)
Addended by: Loura Pardon A on: 04/12/2017 11:43 AM   Modules accepted: Orders

## 2017-04-12 NOTE — Telephone Encounter (Signed)
Printed and in IN box  

## 2017-04-12 NOTE — Telephone Encounter (Signed)
I think she gets shots every 2 months (not every 2 weeks)-please confirm that with her before I write the px

## 2017-04-12 NOTE — Telephone Encounter (Signed)
Yes it is every 2 months, every 8 weeks it was it said last time

## 2017-04-13 ENCOUNTER — Encounter: Payer: Self-pay | Admitting: *Deleted

## 2017-04-23 DIAGNOSIS — F331 Major depressive disorder, recurrent, moderate: Secondary | ICD-10-CM | POA: Diagnosis not present

## 2017-04-23 DIAGNOSIS — F900 Attention-deficit hyperactivity disorder, predominantly inattentive type: Secondary | ICD-10-CM | POA: Diagnosis not present

## 2017-04-23 DIAGNOSIS — F411 Generalized anxiety disorder: Secondary | ICD-10-CM | POA: Diagnosis not present

## 2017-05-03 DIAGNOSIS — F900 Attention-deficit hyperactivity disorder, predominantly inattentive type: Secondary | ICD-10-CM | POA: Diagnosis not present

## 2017-05-03 DIAGNOSIS — F411 Generalized anxiety disorder: Secondary | ICD-10-CM | POA: Diagnosis not present

## 2017-05-03 DIAGNOSIS — F331 Major depressive disorder, recurrent, moderate: Secondary | ICD-10-CM | POA: Diagnosis not present

## 2017-06-06 DIAGNOSIS — F900 Attention-deficit hyperactivity disorder, predominantly inattentive type: Secondary | ICD-10-CM | POA: Diagnosis not present

## 2017-06-06 DIAGNOSIS — F411 Generalized anxiety disorder: Secondary | ICD-10-CM | POA: Diagnosis not present

## 2017-06-06 DIAGNOSIS — E039 Hypothyroidism, unspecified: Secondary | ICD-10-CM | POA: Diagnosis not present

## 2017-06-06 DIAGNOSIS — F331 Major depressive disorder, recurrent, moderate: Secondary | ICD-10-CM | POA: Diagnosis not present

## 2017-07-04 DIAGNOSIS — E039 Hypothyroidism, unspecified: Secondary | ICD-10-CM | POA: Diagnosis not present

## 2017-07-04 DIAGNOSIS — F411 Generalized anxiety disorder: Secondary | ICD-10-CM | POA: Diagnosis not present

## 2017-07-04 DIAGNOSIS — F900 Attention-deficit hyperactivity disorder, predominantly inattentive type: Secondary | ICD-10-CM | POA: Diagnosis not present

## 2017-07-04 DIAGNOSIS — F331 Major depressive disorder, recurrent, moderate: Secondary | ICD-10-CM | POA: Diagnosis not present

## 2017-07-23 DIAGNOSIS — F411 Generalized anxiety disorder: Secondary | ICD-10-CM | POA: Diagnosis not present

## 2017-07-23 DIAGNOSIS — F331 Major depressive disorder, recurrent, moderate: Secondary | ICD-10-CM | POA: Diagnosis not present

## 2017-07-23 DIAGNOSIS — F900 Attention-deficit hyperactivity disorder, predominantly inattentive type: Secondary | ICD-10-CM | POA: Diagnosis not present

## 2017-08-01 ENCOUNTER — Encounter: Payer: BLUE CROSS/BLUE SHIELD | Admitting: Family Medicine

## 2017-08-06 DIAGNOSIS — F900 Attention-deficit hyperactivity disorder, predominantly inattentive type: Secondary | ICD-10-CM | POA: Diagnosis not present

## 2017-08-06 DIAGNOSIS — E039 Hypothyroidism, unspecified: Secondary | ICD-10-CM | POA: Diagnosis not present

## 2017-08-06 DIAGNOSIS — F3341 Major depressive disorder, recurrent, in partial remission: Secondary | ICD-10-CM | POA: Diagnosis not present

## 2017-08-06 DIAGNOSIS — F411 Generalized anxiety disorder: Secondary | ICD-10-CM | POA: Diagnosis not present

## 2017-08-20 ENCOUNTER — Other Ambulatory Visit: Payer: Self-pay | Admitting: Family Medicine

## 2017-08-21 NOTE — Addendum Note (Signed)
Addended by: Tammi Sou on: 08/21/2017 02:27 PM   Modules accepted: Orders

## 2017-08-22 ENCOUNTER — Other Ambulatory Visit: Payer: Self-pay | Admitting: Family Medicine

## 2017-08-22 NOTE — Telephone Encounter (Signed)
CPE scheduled 09/12/17, last filled on 12/18/16 #180 caps with 1 additional refill

## 2017-09-12 ENCOUNTER — Ambulatory Visit (INDEPENDENT_AMBULATORY_CARE_PROVIDER_SITE_OTHER): Payer: BLUE CROSS/BLUE SHIELD | Admitting: Family Medicine

## 2017-09-12 ENCOUNTER — Encounter: Payer: Self-pay | Admitting: Family Medicine

## 2017-09-12 VITALS — BP 126/82 | HR 99 | Temp 98.2°F | Ht 62.25 in | Wt 204.2 lb

## 2017-09-12 DIAGNOSIS — Z23 Encounter for immunization: Secondary | ICD-10-CM | POA: Diagnosis not present

## 2017-09-12 DIAGNOSIS — E119 Type 2 diabetes mellitus without complications: Secondary | ICD-10-CM

## 2017-09-12 DIAGNOSIS — E7800 Pure hypercholesterolemia, unspecified: Secondary | ICD-10-CM

## 2017-09-12 DIAGNOSIS — E559 Vitamin D deficiency, unspecified: Secondary | ICD-10-CM | POA: Diagnosis not present

## 2017-09-12 DIAGNOSIS — E039 Hypothyroidism, unspecified: Secondary | ICD-10-CM | POA: Diagnosis not present

## 2017-09-12 DIAGNOSIS — F418 Other specified anxiety disorders: Secondary | ICD-10-CM

## 2017-09-12 DIAGNOSIS — E538 Deficiency of other specified B group vitamins: Secondary | ICD-10-CM

## 2017-09-12 DIAGNOSIS — Z6837 Body mass index (BMI) 37.0-37.9, adult: Secondary | ICD-10-CM

## 2017-09-12 DIAGNOSIS — Z0000 Encounter for general adult medical examination without abnormal findings: Secondary | ICD-10-CM

## 2017-09-12 MED ORDER — LISINOPRIL 5 MG PO TABS: 2.5000 mg | ORAL_TABLET | Freq: Every day | ORAL | 3 refills | Status: DC

## 2017-09-12 MED ORDER — CYANOCOBALAMIN 1000 MCG/ML IJ SOLN
1000.0000 ug | Freq: Once | INTRAMUSCULAR | Status: AC
  Administered 2017-09-12: 1000 ug via INTRAMUSCULAR

## 2017-09-12 MED ORDER — METFORMIN HCL 1000 MG PO TABS: 1000.0000 mg | ORAL_TABLET | Freq: Two times a day (BID) | ORAL | 3 refills | Status: DC

## 2017-09-12 MED ORDER — ATORVASTATIN CALCIUM 10 MG PO TABS: 10.0000 mg | ORAL_TABLET | Freq: Every day | ORAL | 3 refills | Status: DC

## 2017-09-12 MED ORDER — LEVOTHYROXINE SODIUM 88 MCG PO TABS: 88.0000 ug | ORAL_TABLET | Freq: Every day | ORAL | 3 refills | Status: DC

## 2017-09-12 MED ORDER — CYANOCOBALAMIN 1000 MCG/ML IJ SOLN: 1000.0000 ug | INTRAMUSCULAR | 5 refills | Status: DC

## 2017-09-12 MED ORDER — GLIPIZIDE ER 5 MG PO TB24: 5.0000 mg | ORAL_TABLET | Freq: Every day | ORAL | 3 refills | Status: DC

## 2017-09-12 NOTE — Progress Notes (Signed)
Subjective:    Patient ID: Kelli Cruz, female    DOB: 22-May-1960, 57 y.o.   MRN: [id number]  HPI  Here for health maintenance exam and to review chronic medical problems   Feeling pretty good overall   Wt Readings from Last 3 Encounters:  09/12/17 204 lb 4 oz (92.6 kg)  02/19/17 186 lb 8 oz (84.6 kg)  08/18/16 188 lb 8 oz (85.5 kg)  has gained a lot of weight  37.06 kg/m   Her burning tongue/dry mouth are bad- it helps to keep something in her mouth Tries to drink water  Is drinking soda    Pneumonia vaccine 10/12  Eye exam 6/17 Needs a diabetic eye exam    Mammogram 1/19 per pt - Dr Gaetano Net  Self breast exam -no lumps   Flu shot 11/18  Pap 1/19 - Dr Gaetano Net  Has had a hysterectomy  Colonoscopy 9/15 with 5 y recall  (personal hx of colon cancer)   Zoster status - never had shingles or vaccine    DM2 Lab Results  Component Value Date   HGBA1C 6.8 (H) 02/19/2017   Due for labs  On metformin and glipizide Wt is up  ? If it will be up  Readings have been ok at home- low 100s Not eating well / eating more crackers  Back to soda -needs to stop  Has an exercise bike- needs to use it more   (she does have knee problems)   Hypothyroidism  Pt has no clinical changes No change in energy level/ hair or skin/ edema and no tremor  (has just gained weight) Lab Results  Component Value Date   TSH 1.58 08/18/2016    Due for labs   H/o B12 def Lab Results  Component Value Date   VITAMINB12 273 08/18/2016   Takes B12 injections -is more compliant now  Every 8 weeks    On statin and diet for cholesterol  Lab Results  Component Value Date   CHOL 170 04/11/2017   HDL 49.10 04/11/2017   LDLCALC 100 (H) 04/11/2017   LDLDIRECT 147.2 12/11/2008   TRIG 104.0 04/11/2017   CHOLHDL 3 04/11/2017     H/o vit D def  She takes 2000 iu of vit D daily   Mood -pretty good Sees psychiatrist  No recent medicine changes  Less tearful than she used to be    Patient Active Problem List   Diagnosis Date Noted  . Anemia 02/19/2017  . Vitamin D deficiency 02/19/2017  . Knee pain, right 11/10/2016  . Proteinuria 02/18/2016  . Exposure to communicable disease 08/04/2015  . Obesity 02/04/2015  . Hypersomnia with sleep apnea 01/06/2015  . Insomnia due to mental condition   . Tingling 08/12/2014  . Burning sensation of mouth 08/12/2014  . Numbness 10/24/2011  . Routine general medical examination at a health care facility 10/24/2011  . TMJ arthralgia 08/14/2011  . Poor posture 07/26/2011  . B12 deficiency 04/13/2010  . ADENOCARCINOMA, SIGMOID COLON 08/11/2008  . Hyperlipidemia 07/08/2008  . Hypothyroidism 09/12/2006  . Diabetes type 2, controlled ([place]) 09/12/2006  . Depression with anxiety 09/12/2006  . ALLERGIC RHINITIS, SEASONAL 09/12/2006  . FIBROCYSTIC BREAST DISEASE 09/12/2006  . MIGRAINES, HX OF 09/12/2006   Past Medical History:  Diagnosis Date  . Anxiety   . Bipolar 1 disorder ([place])   . Depression   . Diabetes mellitus    type II  . Endometriosis   . Family history of malignant neoplasm of  gastrointestinal tract   . Ganglion cyst   . Hypothyroidism   . Insomnia due to mental condition    Past Surgical History:  Procedure Laterality Date  . ABDOMINAL HYSTERECTOMY    . ABDOMINAL HYSTERECTOMY    . COLON RESECTION  June 2010  . COLON SURGERY    . COLONOSCOPY    . DILATION AND CURETTAGE OF UTERUS  2006   miscarriage   . GANGLION CYST EXCISION    . HERNIA REPAIR    . INTERSTIM IMPLANT PLACEMENT Left 12/11/2012   stimulator is on the left but the electrodes go to the right  . KNEE DISLOCATION SURGERY    . LAPAROSCOPY  1997   D & C for endometriosis   Social History   Tobacco Use  . Smoking status: Passive Smoke Exposure - Never Smoker  . Smokeless tobacco: Never Used  . Tobacco comment: husband quit smoking  Substance Use Topics  . Alcohol use: Yes    Alcohol/week: 0.0 oz    Comment: socially twice a month at  most or less  . Drug use: No   Family History  Problem Relation Age of Onset  . Breast cancer Mother   . Diabetes Mother   . Coronary artery disease Mother   . Kidney disease Mother        renal insufficiency  . Hyperlipidemia Mother   . Diabetes Father   . Coronary artery disease Father   . Colon cancer Father   . Colon cancer Paternal Grandmother   . Breast cancer Maternal Aunt   . Breast cancer Paternal Aunt    Allergies  Allergen Reactions  . Hydrocodone     REACTION: nausea  . Milk-Related Compounds Nausea And Vomiting   Current Outpatient Medications on File Prior to Visit  Medication Sig Dispense Refill  . acetaminophen (TYLENOL) 325 MG tablet Take 650 mg by mouth every 6 (six) hours as needed. For pain    . ALPRAZolam (XANAX) 0.5 MG tablet Take 0.5 mg by mouth at bedtime as needed. For anxiety    . amphetamine-dextroamphetamine (ADDERALL XR) 30 MG 24 hr capsule Take 30 mg by mouth every morning.    Marland Kitchen amphetamine-dextroamphetamine (ADDERALL) 15 MG tablet Take 1 tablet by mouth daily.    . citalopram (CELEXA) 40 MG tablet Take 40 mg by mouth daily.     . Diabetic Sterile Lancets MISC by Does not apply route. Check glucose two times a day and as needed for out of control DM 250.00     . doxepin (SINEQUAN) 50 MG capsule Take 1 capsule by mouth at bedtime.  1  . ESTRADIOL VA Place vaginally 2 (two) times a week. Reported on 02/03/2015    . gabapentin (NEURONTIN) 300 MG capsule TAKE 1 CAPSULE TWICE A DAY 180 capsule 0  . glucose blood test strip 1 each by Other route. Check glucose two times a day and as needed for out of control DM 250.00     . ibuprofen (ADVIL,MOTRIN) 200 MG tablet Take 400 mg by mouth as directed. For pain    . magic mouthwash w/lidocaine SOLN Take 5 mLs by mouth 3 (three) times daily. 150 mL 5  . VRAYLAR 3 MG CAPS Take 1 capsule by mouth daily.    Marland Kitchen zolpidem (AMBIEN) 5 MG tablet Take 5 mg by mouth at bedtime as needed. For sleep    . [DISCONTINUED]  cholestyramine (QUESTRAN) 4 G packet Mix with juice or water and drink every 1-3 days. [place]  each 1   No current facility-administered medications on file prior to visit.       Review of Systems  Constitutional: Positive for fatigue. Negative for activity change, appetite change, fever and unexpected weight change.  HENT: Negative for congestion, ear pain, rhinorrhea, sinus pressure and sore throat.        Chronic burning mouth syndrome   Eyes: Negative for pain, redness and visual disturbance.  Respiratory: Negative for cough, shortness of breath and wheezing.   Cardiovascular: Negative for chest pain and palpitations.  Gastrointestinal: Negative for abdominal pain, blood in stool, constipation and diarrhea.  Endocrine: Negative for polydipsia and polyuria.  Genitourinary: Negative for dysuria, frequency and urgency.  Musculoskeletal: Negative for arthralgias, back pain and myalgias.  Skin: Negative for pallor and rash.  Allergic/Immunologic: Negative for environmental allergies.  Neurological: Negative for dizziness, syncope and headaches.  Hematological: Negative for adenopathy. Does not bruise/bleed easily.  Psychiatric/Behavioral: Negative for decreased concentration and dysphoric mood. The patient is nervous/anxious.        Stable anxiety and depression  Some stressors        Objective:   Physical Exam  Constitutional: She appears well-developed and well-nourished. No distress.  obese and well appearing   HENT:  Head: Normocephalic and atraumatic.  Right Ear: External ear normal.  Left Ear: External ear normal.  Nose: Nose normal.  Mouth/Throat: Oropharynx is clear and moist.  Eyes: Pupils are equal, round, and reactive to light. Conjunctivae and EOM are normal. Right eye exhibits no discharge. Left eye exhibits no discharge. No scleral icterus.  Neck: Normal range of motion. Neck supple. No JVD present. Carotid bruit is not present. No thyromegaly present.  Cardiovascular:  Normal rate, regular rhythm, normal heart sounds and intact distal pulses. Exam reveals no gallop.  Pulmonary/Chest: Effort normal and breath sounds normal. No stridor. No respiratory distress. She has no wheezes. She has no rales.  Abdominal: Soft. Bowel sounds are normal. She exhibits no distension and no mass. There is no tenderness.  Musculoskeletal: She exhibits no edema or tenderness.  Lymphadenopathy:    She has no cervical adenopathy.  Neurological: She is alert. She has normal reflexes. She displays normal reflexes. No cranial nerve deficit. She exhibits normal muscle tone. Coordination normal.  Mild hand tremor   Skin: Skin is warm and dry. No rash noted. No erythema. No pallor.  Solar lentigines diffusely   Psychiatric: Her affect is blunt.  Mildly blunted affect  Pleasant           Assessment & Plan:   Problem List Items Addressed This Visit      Endocrine   Diabetes type 2, controlled ([place])    A1C today  Disc diet in detail -wt gain noted  Exercise planned Eye doctor appt planned  Disc foot care       Relevant Medications   atorvastatin (LIPITOR) 10 MG tablet   glipiZIDE (GLUCOTROL XL) 5 MG 24 hr tablet   lisinopril (PRINIVIL,ZESTRIL) 5 MG tablet   metFORMIN (GLUCOPHAGE) 1000 MG tablet   Other Relevant Orders   Hemoglobin A1c (Completed)   Pneumococcal polysaccharide vaccine 23-valent greater than or equal to 2yo subcutaneous/IM (Completed)   Hypothyroidism    Hypothyroidism  Pt has no clinical changes No change in energy level/ hair or skin/ edema and no tremor TSH today  Adj levothy dose if needed        Relevant Medications   levothyroxine (SYNTHROID, LEVOTHROID) 88 MCG tablet   Other Relevant Orders  TSH (Completed)     Other   B12 deficiency    Getting injections Q 8 weeks Better energy Level today Injection today      Relevant Medications   cyanocobalamin ((VITAMIN B-12)) injection 1,000 mcg (Completed)   Other Relevant Orders    Vitamin B12 (Completed)   Depression with anxiety    Stable with psychiatry tx       Hyperlipidemia    Disc goals for lipids and reasons to control them Rev last labs with pt Rev low sat fat diet in detail  Continue statin  Lipid profile today      Relevant Medications   atorvastatin (LIPITOR) 10 MG tablet   lisinopril (PRINIVIL,ZESTRIL) 5 MG tablet   Other Relevant Orders   Comprehensive metabolic panel (Completed)   Lipid panel (Completed)   Obesity    Discussed how this problem influences overall health and the risks it imposes  Reviewed plan for weight loss with lower calorie diet (via better food choices and also portion control or program like weight watchers) and exercise building up to or more than 30 minutes 5 days per week including some aerobic activity         Relevant Medications   amphetamine-dextroamphetamine (ADDERALL) 15 MG tablet   glipiZIDE (GLUCOTROL XL) 5 MG 24 hr tablet   metFORMIN (GLUCOPHAGE) 1000 MG tablet   Routine general medical examination at a health care facility - Primary    Reviewed health habits including diet and exercise and skin cancer prevention Reviewed appropriate screening tests for age  Also reviewed health mt list, fam hx and immunization status , as well as social and family history   See HPI Labs for wellness today  Disc need for wt loss and plan  Disc shingrix vaccine Pneumovax 23 today      Relevant Orders   CBC with Differential/Platelet (Completed)   Comprehensive metabolic panel (Completed)   Lipid panel (Completed)   TSH (Completed)   Vitamin D deficiency    Level today  Disc imp to bone and overall health  Takes 2000 iu D3 daily      Relevant Orders   VITAMIN D 25 Hydroxy (Vit-D Deficiency, Fractures) (Completed)    Other Visit Diagnoses    Need for prophylactic vaccination against Streptococcus pneumoniae (pneumococcus)       Relevant Orders   Pneumococcal polysaccharide vaccine 23-valent greater than or  equal to 2yo subcutaneous/IM (Completed)

## 2017-09-12 NOTE — Patient Instructions (Addendum)
For weight loss and diabetes Try to get most of your carbohydrates from produce (with the exception of white potatoes)  Eat less bread/pasta/rice/snack foods/cereals/sweets and other items from the middle of the grocery store (processed carbs)  Get rid of soda and other drinks with sugar   Please schedule a diabetic eye exam   Don't forget to get a flu shot this fall   If you are interested in the new shingles vaccine (Shingrix) - call your local pharmacy to check on coverage and availability  If affordable - get on a waiting list at your pharmacy (we do not have it in the office)   Try to work up to 30 minutes of exercise a day / the bike is great   Pneumonia vaccine today   B12 shot today

## 2017-09-13 LAB — VITAMIN B12: Vitamin B-12: >1500 pg/mL — ABNORMAL HIGH (ref 211–911)

## 2017-09-13 LAB — COMPREHENSIVE METABOLIC PANEL
ALK PHOS: 71 U/L (ref 39–117)
ALT: 15 U/L (ref 0–35)
AST: 17 U/L (ref 0–37)
Albumin: 4.3 g/dL (ref 3.5–5.2)
BUN: 14 mg/dL (ref 6–23)
CO2: 28 mEq/L (ref 19–32)
CREATININE: 0.95 mg/dL (ref 0.40–1.20)
Calcium: 9.7 mg/dL (ref 8.4–10.5)
Chloride: 102 mEq/L (ref 96–112)
GFR: 64.97 mL/min (ref >60.00)
GLUCOSE: 88 mg/dL (ref 70–99)
POTASSIUM: 4.2 meq/L (ref 3.5–5.1)
SODIUM: 139 meq/L (ref 135–145)
TOTAL PROTEIN: 7.5 g/dL (ref 6.0–8.3)
Total Bilirubin: 0.6 mg/dL (ref 0.2–1.2)

## 2017-09-13 LAB — VITAMIN D 25 HYDROXY (VIT D DEFICIENCY, FRACTURES): VITD: 28.47 ng/mL — AB (ref 30.00–100.00)

## 2017-09-13 LAB — CBC WITH DIFFERENTIAL/PLATELET
BASOS ABS: 0.1 10*3/uL (ref 0.0–0.1)
Basophils Relative: 0.9 % (ref 0.0–3.0)
EOS ABS: 0.2 10*3/uL (ref 0.0–0.7)
EOS PCT: 1.8 % (ref 0.0–5.0)
HCT: 37.2 % (ref 36.0–46.0)
HEMOGLOBIN: 12.2 g/dL (ref 12.0–15.0)
LYMPHS ABS: 2.7 10*3/uL (ref 0.7–4.0)
Lymphocytes Relative: 33 % (ref 12.0–46.0)
MCHC: 32.8 g/dL (ref 30.0–36.0)
MCV: 95.4 fl (ref 78.0–100.0)
MONO ABS: 0.3 10*3/uL (ref 0.1–1.0)
Monocytes Relative: 4 % (ref 3.0–12.0)
NEUTROS PCT: 60.3 % (ref 43.0–77.0)
Neutro Abs: 5 10*3/uL (ref 1.4–7.7)
Platelets: 322 10*3/uL (ref 150.0–400.0)
RBC: 3.89 Mil/uL (ref 3.87–5.11)
RDW: 14.5 % (ref 11.5–15.5)
WBC: 8.3 10*3/uL (ref 4.0–10.5)

## 2017-09-13 LAB — LIPID PANEL
Cholesterol: 146 mg/dL (ref 0–200)
HDL: 44.1 mg/dL (ref >39.00)
LDL CALC: 65 mg/dL (ref 0–99)
NONHDL: 102.27
Total CHOL/HDL Ratio: 3
Triglycerides: 188 mg/dL — ABNORMAL HIGH (ref 0.0–149.0)
VLDL: 37.6 mg/dL (ref 0.0–40.0)

## 2017-09-13 LAB — TSH: TSH: 13.16 u[IU]/mL — ABNORMAL HIGH (ref 0.35–4.50)

## 2017-09-13 LAB — HEMOGLOBIN A1C: HEMOGLOBIN A1C: 7.1 % — AB (ref 4.6–6.5)

## 2017-09-15 NOTE — Assessment & Plan Note (Signed)
A1C today  Disc diet in detail -wt gain noted  Exercise planned Eye doctor appt planned  Disc foot care

## 2017-09-15 NOTE — Assessment & Plan Note (Signed)
Stable with psychiatry tx

## 2017-09-15 NOTE — Assessment & Plan Note (Signed)
Reviewed health habits including diet and exercise and skin cancer prevention Reviewed appropriate screening tests for age  Also reviewed health mt list, fam hx and immunization status , as well as social and family history   See HPI Labs for wellness today  Disc need for wt loss and plan  Disc shingrix vaccine Pneumovax 23 today

## 2017-09-15 NOTE — Assessment & Plan Note (Signed)
Level today  Disc imp to bone and overall health  Takes 2000 iu D3 daily

## 2017-09-15 NOTE — Assessment & Plan Note (Signed)
Disc goals for lipids and reasons to control them Rev last labs with pt Rev low sat fat diet in detail  Continue statin  Lipid profile today

## 2017-09-15 NOTE — Assessment & Plan Note (Signed)
Discussed how this problem influences overall health and the risks it imposes  Reviewed plan for weight loss with lower calorie diet (via better food choices and also portion control or program like weight watchers) and exercise building up to or more than 30 minutes 5 days per week including some aerobic activity    

## 2017-09-15 NOTE — Assessment & Plan Note (Signed)
Hypothyroidism  Pt has no clinical changes No change in energy level/ hair or skin/ edema and no tremor TSH today  Adj levothy dose if needed

## 2017-09-15 NOTE — Assessment & Plan Note (Addendum)
Getting injections Q 8 weeks Better energy Level today Injection today

## 2017-09-18 DIAGNOSIS — M25561 Pain in right knee: Secondary | ICD-10-CM | POA: Diagnosis not present

## 2017-09-18 DIAGNOSIS — M25661 Stiffness of right knee, not elsewhere classified: Secondary | ICD-10-CM | POA: Diagnosis not present

## 2017-09-18 DIAGNOSIS — E669 Obesity, unspecified: Secondary | ICD-10-CM | POA: Diagnosis not present

## 2017-09-18 DIAGNOSIS — M6281 Muscle weakness (generalized): Secondary | ICD-10-CM | POA: Diagnosis not present

## 2017-09-19 DIAGNOSIS — E039 Hypothyroidism, unspecified: Secondary | ICD-10-CM | POA: Diagnosis not present

## 2017-09-19 DIAGNOSIS — F411 Generalized anxiety disorder: Secondary | ICD-10-CM | POA: Diagnosis not present

## 2017-09-19 DIAGNOSIS — F3341 Major depressive disorder, recurrent, in partial remission: Secondary | ICD-10-CM | POA: Diagnosis not present

## 2017-09-19 DIAGNOSIS — F900 Attention-deficit hyperactivity disorder, predominantly inattentive type: Secondary | ICD-10-CM | POA: Diagnosis not present

## 2017-09-25 ENCOUNTER — Encounter: Payer: Self-pay | Admitting: *Deleted

## 2017-09-25 ENCOUNTER — Telehealth: Payer: Self-pay | Admitting: Family Medicine

## 2017-09-25 DIAGNOSIS — M25561 Pain in right knee: Secondary | ICD-10-CM | POA: Diagnosis not present

## 2017-09-25 DIAGNOSIS — E669 Obesity, unspecified: Secondary | ICD-10-CM | POA: Diagnosis not present

## 2017-09-25 DIAGNOSIS — M6281 Muscle weakness (generalized): Secondary | ICD-10-CM | POA: Diagnosis not present

## 2017-09-25 DIAGNOSIS — M25661 Stiffness of right knee, not elsewhere classified: Secondary | ICD-10-CM | POA: Diagnosis not present

## 2017-09-25 NOTE — Telephone Encounter (Signed)
Pt given results per notes of Dr. Glori Bickers on 09/13/17. Pt verbalized understanding.Pt would like for prescription of Levothyroxine to be sent to [facility]. Pt wants to know if she needs to come in before appt to have labs done or if labs could be done at appt scheduled 03/13/17. Pt states she works about an hour away in [place] near [place] and if labs were needed before scheduled appt she wants to know if an order could be written to be used at a location closer to where she works.Unable to document in result note.

## 2017-09-26 ENCOUNTER — Telehealth: Payer: Self-pay | Admitting: *Deleted

## 2017-09-26 MED ORDER — LEVOTHYROXINE SODIUM 100 MCG PO TABS: 100.0000 ug | ORAL_TABLET | Freq: Every day | ORAL | 1 refills | Status: DC

## 2017-09-26 NOTE — Telephone Encounter (Signed)
I sent the px in -increased her levothyroxine from 88 to 100 mcg daily   Order written (in in box) for TSH check in 6 weeks at outside lab

## 2017-09-26 NOTE — Addendum Note (Signed)
Addended by: Loura Pardon A on: 09/26/2017 01:02 PM   Modules accepted: Orders

## 2017-09-26 NOTE — Telephone Encounter (Signed)
Called pt and no answer and pt's VM box was full, lab orders mailed to pt and CRM created

## 2017-09-26 NOTE — Telephone Encounter (Signed)
Message from Dr. Glori Bickers read to patient; verbalizes understanding.

## 2017-09-27 DIAGNOSIS — M6281 Muscle weakness (generalized): Secondary | ICD-10-CM | POA: Diagnosis not present

## 2017-09-27 DIAGNOSIS — M25661 Stiffness of right knee, not elsewhere classified: Secondary | ICD-10-CM | POA: Diagnosis not present

## 2017-09-27 DIAGNOSIS — E669 Obesity, unspecified: Secondary | ICD-10-CM | POA: Diagnosis not present

## 2017-09-27 DIAGNOSIS — M25561 Pain in right knee: Secondary | ICD-10-CM | POA: Diagnosis not present

## 2017-10-02 DIAGNOSIS — E669 Obesity, unspecified: Secondary | ICD-10-CM | POA: Diagnosis not present

## 2017-10-02 DIAGNOSIS — M25661 Stiffness of right knee, not elsewhere classified: Secondary | ICD-10-CM | POA: Diagnosis not present

## 2017-10-02 DIAGNOSIS — M25561 Pain in right knee: Secondary | ICD-10-CM | POA: Diagnosis not present

## 2017-10-02 DIAGNOSIS — M6281 Muscle weakness (generalized): Secondary | ICD-10-CM | POA: Diagnosis not present

## 2017-10-04 DIAGNOSIS — M25661 Stiffness of right knee, not elsewhere classified: Secondary | ICD-10-CM | POA: Diagnosis not present

## 2017-10-04 DIAGNOSIS — E669 Obesity, unspecified: Secondary | ICD-10-CM | POA: Diagnosis not present

## 2017-10-04 DIAGNOSIS — M6281 Muscle weakness (generalized): Secondary | ICD-10-CM | POA: Diagnosis not present

## 2017-10-04 DIAGNOSIS — M25561 Pain in right knee: Secondary | ICD-10-CM | POA: Diagnosis not present

## 2017-10-16 DIAGNOSIS — M25661 Stiffness of right knee, not elsewhere classified: Secondary | ICD-10-CM | POA: Diagnosis not present

## 2017-10-16 DIAGNOSIS — M6281 Muscle weakness (generalized): Secondary | ICD-10-CM | POA: Diagnosis not present

## 2017-10-16 DIAGNOSIS — M25561 Pain in right knee: Secondary | ICD-10-CM | POA: Diagnosis not present

## 2017-10-16 DIAGNOSIS — E669 Obesity, unspecified: Secondary | ICD-10-CM | POA: Diagnosis not present

## 2017-10-18 DIAGNOSIS — M25561 Pain in right knee: Secondary | ICD-10-CM | POA: Diagnosis not present

## 2017-10-18 DIAGNOSIS — M6281 Muscle weakness (generalized): Secondary | ICD-10-CM | POA: Diagnosis not present

## 2017-10-18 DIAGNOSIS — E669 Obesity, unspecified: Secondary | ICD-10-CM | POA: Diagnosis not present

## 2017-10-18 DIAGNOSIS — M25661 Stiffness of right knee, not elsewhere classified: Secondary | ICD-10-CM | POA: Diagnosis not present

## 2017-10-23 DIAGNOSIS — M25661 Stiffness of right knee, not elsewhere classified: Secondary | ICD-10-CM | POA: Diagnosis not present

## 2017-10-23 DIAGNOSIS — E669 Obesity, unspecified: Secondary | ICD-10-CM | POA: Diagnosis not present

## 2017-10-23 DIAGNOSIS — M6281 Muscle weakness (generalized): Secondary | ICD-10-CM | POA: Diagnosis not present

## 2017-10-23 DIAGNOSIS — M25561 Pain in right knee: Secondary | ICD-10-CM | POA: Diagnosis not present

## 2017-10-25 DIAGNOSIS — M25561 Pain in right knee: Secondary | ICD-10-CM | POA: Diagnosis not present

## 2017-10-25 DIAGNOSIS — M6281 Muscle weakness (generalized): Secondary | ICD-10-CM | POA: Diagnosis not present

## 2017-10-25 DIAGNOSIS — M25661 Stiffness of right knee, not elsewhere classified: Secondary | ICD-10-CM | POA: Diagnosis not present

## 2017-10-25 DIAGNOSIS — E669 Obesity, unspecified: Secondary | ICD-10-CM | POA: Diagnosis not present

## 2017-10-30 DIAGNOSIS — E669 Obesity, unspecified: Secondary | ICD-10-CM | POA: Diagnosis not present

## 2017-10-30 DIAGNOSIS — M25561 Pain in right knee: Secondary | ICD-10-CM | POA: Diagnosis not present

## 2017-10-30 DIAGNOSIS — M6281 Muscle weakness (generalized): Secondary | ICD-10-CM | POA: Diagnosis not present

## 2017-10-30 DIAGNOSIS — M25661 Stiffness of right knee, not elsewhere classified: Secondary | ICD-10-CM | POA: Diagnosis not present

## 2017-10-31 DIAGNOSIS — F411 Generalized anxiety disorder: Secondary | ICD-10-CM | POA: Diagnosis not present

## 2017-10-31 DIAGNOSIS — E039 Hypothyroidism, unspecified: Secondary | ICD-10-CM | POA: Diagnosis not present

## 2017-10-31 DIAGNOSIS — F3341 Major depressive disorder, recurrent, in partial remission: Secondary | ICD-10-CM | POA: Diagnosis not present

## 2017-10-31 DIAGNOSIS — F900 Attention-deficit hyperactivity disorder, predominantly inattentive type: Secondary | ICD-10-CM | POA: Diagnosis not present

## 2017-11-01 DIAGNOSIS — M25661 Stiffness of right knee, not elsewhere classified: Secondary | ICD-10-CM | POA: Diagnosis not present

## 2017-11-01 DIAGNOSIS — E669 Obesity, unspecified: Secondary | ICD-10-CM | POA: Diagnosis not present

## 2017-11-01 DIAGNOSIS — M25561 Pain in right knee: Secondary | ICD-10-CM | POA: Diagnosis not present

## 2017-11-01 DIAGNOSIS — M6281 Muscle weakness (generalized): Secondary | ICD-10-CM | POA: Diagnosis not present

## 2017-11-06 DIAGNOSIS — M25661 Stiffness of right knee, not elsewhere classified: Secondary | ICD-10-CM | POA: Diagnosis not present

## 2017-11-06 DIAGNOSIS — M6281 Muscle weakness (generalized): Secondary | ICD-10-CM | POA: Diagnosis not present

## 2017-11-06 DIAGNOSIS — E669 Obesity, unspecified: Secondary | ICD-10-CM | POA: Diagnosis not present

## 2017-11-06 DIAGNOSIS — M25561 Pain in right knee: Secondary | ICD-10-CM | POA: Diagnosis not present

## 2017-11-08 DIAGNOSIS — M25561 Pain in right knee: Secondary | ICD-10-CM | POA: Diagnosis not present

## 2017-11-08 DIAGNOSIS — M25661 Stiffness of right knee, not elsewhere classified: Secondary | ICD-10-CM | POA: Diagnosis not present

## 2017-11-08 DIAGNOSIS — M6281 Muscle weakness (generalized): Secondary | ICD-10-CM | POA: Diagnosis not present

## 2017-11-08 DIAGNOSIS — E669 Obesity, unspecified: Secondary | ICD-10-CM | POA: Diagnosis not present

## 2017-11-14 DIAGNOSIS — E669 Obesity, unspecified: Secondary | ICD-10-CM | POA: Diagnosis not present

## 2017-11-14 DIAGNOSIS — M6281 Muscle weakness (generalized): Secondary | ICD-10-CM | POA: Diagnosis not present

## 2017-11-14 DIAGNOSIS — M25661 Stiffness of right knee, not elsewhere classified: Secondary | ICD-10-CM | POA: Diagnosis not present

## 2017-11-14 DIAGNOSIS — M25561 Pain in right knee: Secondary | ICD-10-CM | POA: Diagnosis not present

## 2017-11-15 DIAGNOSIS — E669 Obesity, unspecified: Secondary | ICD-10-CM | POA: Diagnosis not present

## 2017-11-15 DIAGNOSIS — M25561 Pain in right knee: Secondary | ICD-10-CM | POA: Diagnosis not present

## 2017-11-15 DIAGNOSIS — M6281 Muscle weakness (generalized): Secondary | ICD-10-CM | POA: Diagnosis not present

## 2017-11-15 DIAGNOSIS — M25661 Stiffness of right knee, not elsewhere classified: Secondary | ICD-10-CM | POA: Diagnosis not present

## 2017-11-16 ENCOUNTER — Encounter: Payer: Self-pay | Admitting: Family Medicine

## 2017-11-22 DIAGNOSIS — M6281 Muscle weakness (generalized): Secondary | ICD-10-CM | POA: Diagnosis not present

## 2017-11-22 DIAGNOSIS — E669 Obesity, unspecified: Secondary | ICD-10-CM | POA: Diagnosis not present

## 2017-11-22 DIAGNOSIS — M25561 Pain in right knee: Secondary | ICD-10-CM | POA: Diagnosis not present

## 2017-11-22 DIAGNOSIS — M25661 Stiffness of right knee, not elsewhere classified: Secondary | ICD-10-CM | POA: Diagnosis not present

## 2017-11-23 DIAGNOSIS — E669 Obesity, unspecified: Secondary | ICD-10-CM | POA: Diagnosis not present

## 2017-11-23 DIAGNOSIS — M25561 Pain in right knee: Secondary | ICD-10-CM | POA: Diagnosis not present

## 2017-11-23 DIAGNOSIS — M6281 Muscle weakness (generalized): Secondary | ICD-10-CM | POA: Diagnosis not present

## 2017-11-23 DIAGNOSIS — M25661 Stiffness of right knee, not elsewhere classified: Secondary | ICD-10-CM | POA: Diagnosis not present

## 2017-12-12 ENCOUNTER — Other Ambulatory Visit: Payer: Self-pay | Admitting: Family Medicine

## 2017-12-12 NOTE — Telephone Encounter (Signed)
Next OV is 03/13/18, last filled on 08/22/17 #180 caps with 0 refills

## 2018-01-14 DIAGNOSIS — F411 Generalized anxiety disorder: Secondary | ICD-10-CM | POA: Diagnosis not present

## 2018-01-14 DIAGNOSIS — F331 Major depressive disorder, recurrent, moderate: Secondary | ICD-10-CM | POA: Diagnosis not present

## 2018-01-14 DIAGNOSIS — F900 Attention-deficit hyperactivity disorder, predominantly inattentive type: Secondary | ICD-10-CM | POA: Diagnosis not present

## 2018-02-28 DIAGNOSIS — F900 Attention-deficit hyperactivity disorder, predominantly inattentive type: Secondary | ICD-10-CM | POA: Diagnosis not present

## 2018-02-28 DIAGNOSIS — F411 Generalized anxiety disorder: Secondary | ICD-10-CM | POA: Diagnosis not present

## 2018-03-07 ENCOUNTER — Other Ambulatory Visit: Payer: Self-pay | Admitting: Family Medicine

## 2018-03-07 DIAGNOSIS — F900 Attention-deficit hyperactivity disorder, predominantly inattentive type: Secondary | ICD-10-CM | POA: Diagnosis not present

## 2018-03-07 DIAGNOSIS — F3341 Major depressive disorder, recurrent, in partial remission: Secondary | ICD-10-CM | POA: Diagnosis not present

## 2018-03-07 DIAGNOSIS — E039 Hypothyroidism, unspecified: Secondary | ICD-10-CM | POA: Diagnosis not present

## 2018-03-07 DIAGNOSIS — F411 Generalized anxiety disorder: Secondary | ICD-10-CM | POA: Diagnosis not present

## 2018-03-08 ENCOUNTER — Encounter: Payer: Self-pay | Admitting: Family Medicine

## 2018-03-08 ENCOUNTER — Ambulatory Visit: Payer: BLUE CROSS/BLUE SHIELD | Admitting: Family Medicine

## 2018-03-08 VITALS — BP 118/72 | HR 83 | Temp 98.1°F | Ht 62.25 in | Wt 208.0 lb

## 2018-03-08 DIAGNOSIS — E7800 Pure hypercholesterolemia, unspecified: Secondary | ICD-10-CM | POA: Diagnosis not present

## 2018-03-08 DIAGNOSIS — E039 Hypothyroidism, unspecified: Secondary | ICD-10-CM | POA: Diagnosis not present

## 2018-03-08 DIAGNOSIS — E538 Deficiency of other specified B group vitamins: Secondary | ICD-10-CM

## 2018-03-08 DIAGNOSIS — C187 Malignant neoplasm of sigmoid colon: Secondary | ICD-10-CM

## 2018-03-08 DIAGNOSIS — E119 Type 2 diabetes mellitus without complications: Secondary | ICD-10-CM

## 2018-03-08 DIAGNOSIS — Z6837 Body mass index (BMI) 37.0-37.9, adult: Secondary | ICD-10-CM

## 2018-03-08 LAB — LIPID PANEL
CHOLESTEROL: 147 mg/dL (ref 0–200)
HDL: 43.5 mg/dL (ref >39.00)
LDL Cholesterol: 75 mg/dL (ref 0–99)
NONHDL: 103.04
Total CHOL/HDL Ratio: 3
Triglycerides: 139 mg/dL (ref 0.0–149.0)
VLDL: 27.8 mg/dL (ref 0.0–40.0)

## 2018-03-08 LAB — TSH: TSH: 5.93 u[IU]/mL — AB (ref 0.35–4.50)

## 2018-03-08 LAB — POCT GLYCOSYLATED HEMOGLOBIN (HGB A1C): Hemoglobin A1C: 6.8 % — AB (ref 4.0–5.6)

## 2018-03-08 MED ORDER — CYANOCOBALAMIN 1000 MCG/ML IJ SOLN
1000.0000 ug | Freq: Once | INTRAMUSCULAR | Status: AC
  Administered 2018-03-08: 1000 ug via INTRAMUSCULAR

## 2018-03-08 MED ORDER — CYANOCOBALAMIN 1000 MCG/ML IJ SOLN: 1000.0000 ug | INTRAMUSCULAR | 5 refills | Status: DC

## 2018-03-08 NOTE — Assessment & Plan Note (Signed)
Re check after dose adjustment last time  Some fatigue (baseline) No other clinical changes

## 2018-03-08 NOTE — Patient Instructions (Addendum)
B12 shot today  Labs today   A1C is improved- keep working on diet (and exercise as tolerated)   Please get your eye exam   Space your B12 shots to every 3 months   Good luck getting your back better 1

## 2018-03-08 NOTE — Assessment & Plan Note (Signed)
Disc goals for lipids and reasons to control them Rev last labs with pt Rev low sat fat diet in detail Labs today  Diet and statin

## 2018-03-08 NOTE — Progress Notes (Signed)
Subjective:    Patient ID: Kelli Cruz, female    DOB: 1960/03/20, 58 y.o.   MRN: [id number]  HPI Here for f/u of chronic health problems   Had a bad fall in aug  Injured her back  Also broke ribs Out of work since oct- getting shots in her back (have helped)  Walking and exercise bike for exercise   Also has large kidney stone -needs to be treated   Wt Readings from Last 3 Encounters:  03/08/18 208 lb (94.3 kg)  09/12/17 204 lb 4 oz (92.6 kg)  02/19/17 186 lb 8 oz (84.6 kg)  gained some weight  Less active  37.74 kg/m   Diabetes Home sugar results - in general glucose has been lower  DM diet - good overall  Exercise -bike/walking (not enough)  Symptoms- none  A1C last  Lab Results  Component Value Date   HGBA1C 7.1 (H) 09/12/2017   Lab Results  Component Value Date   HGBA1C 6.8 (A) 03/08/2018  improved today    No problems with medications  Metformin 1000 mg bid , glipizide 5 xl  Renal protection- ace  Last eye exam  -needs to schedule that (goes to [facility])  Flu vaccine - had it in oct  BP Readings from Last 3 Encounters:  03/08/18 118/72  09/12/17 126/82  02/19/17 122/64   Pulse Readings from Last 3 Encounters:  03/08/18 83  09/12/17 99  02/19/17 77   Lab Results  Component Value Date   CREATININE 0.95 09/12/2017   BUN 14 09/12/2017   NA 139 09/12/2017   K 4.2 09/12/2017   CL 102 09/12/2017   CO2 28 09/12/2017   Thyroid  Lab Results  Component Value Date   TSH 13.16 (H) 09/12/2017   100 mcg levothy  Feels fair - tired (? B12)   B12 def Lab Results  Component Value Date   VITAMINB12 >1500 (H) 09/12/2017  last B12 shot in oct (usually every 8 weeks)     Patient Active Problem List   Diagnosis Date Noted  . Anemia 02/19/2017  . Vitamin D deficiency 02/19/2017  . Knee pain, right 11/10/2016  . Proteinuria 02/18/2016  . Exposure to communicable disease 08/04/2015  . Obesity 02/04/2015  . Hypersomnia with sleep apnea  01/06/2015  . Insomnia due to mental condition   . Tingling 08/12/2014  . Burning sensation of mouth 08/12/2014  . Numbness 10/24/2011  . Routine general medical examination at a health care facility 10/24/2011  . TMJ arthralgia 08/14/2011  . Poor posture 07/26/2011  . B12 deficiency 04/13/2010  . ADENOCARCINOMA, SIGMOID COLON 08/11/2008  . Hyperlipidemia 07/08/2008  . Hypothyroidism 09/12/2006  . Diabetes type 2, controlled ([place]) 09/12/2006  . Depression with anxiety 09/12/2006  . ALLERGIC RHINITIS, SEASONAL 09/12/2006  . FIBROCYSTIC BREAST DISEASE 09/12/2006  . MIGRAINES, HX OF 09/12/2006   Past Medical History:  Diagnosis Date  . Anxiety   . Bipolar 1 disorder ([place])   . Depression   . Diabetes mellitus    type II  . Endometriosis   . Family history of malignant neoplasm of gastrointestinal tract   . Ganglion cyst   . Hypothyroidism   . Insomnia due to mental condition    Past Surgical History:  Procedure Laterality Date  . ABDOMINAL HYSTERECTOMY    . ABDOMINAL HYSTERECTOMY    . COLON RESECTION  June 2010  . COLON SURGERY    . COLONOSCOPY    . DILATION AND CURETTAGE OF UTERUS  2006   miscarriage   . GANGLION CYST EXCISION    . HERNIA REPAIR    . INTERSTIM IMPLANT PLACEMENT Left 12/11/2012   stimulator is on the left but the electrodes go to the right  . KNEE DISLOCATION SURGERY    . LAPAROSCOPY  1997   D & C for endometriosis   Social History   Tobacco Use  . Smoking status: Passive Smoke Exposure - Never Smoker  . Smokeless tobacco: Never Used  . Tobacco comment: husband quit smoking  Substance Use Topics  . Alcohol use: Yes    Alcohol/week: 0.0 standard drinks    Comment: socially twice a month at most or less  . Drug use: No   Family History  Problem Relation Age of Onset  . Breast cancer Mother   . Diabetes Mother   . Coronary artery disease Mother   . Kidney disease Mother        renal insufficiency  . Hyperlipidemia Mother   . Diabetes  Father   . Coronary artery disease Father   . Colon cancer Father   . Colon cancer Paternal Grandmother   . Breast cancer Maternal Aunt   . Breast cancer Paternal Aunt    Allergies  Allergen Reactions  . Hydrocodone     REACTION: nausea  . Milk-Related Compounds Nausea And Vomiting   Current Outpatient Medications on File Prior to Visit  Medication Sig Dispense Refill  . acetaminophen (TYLENOL) 325 MG tablet Take 650 mg by mouth every 6 (six) hours as needed. For pain    . ALPRAZolam (XANAX) 0.5 MG tablet Take 0.5 mg by mouth at bedtime as needed. For anxiety    . amphetamine-dextroamphetamine (ADDERALL XR) 30 MG 24 hr capsule Take 30 mg by mouth every morning.    Marland Kitchen amphetamine-dextroamphetamine (ADDERALL) 15 MG tablet Take 1 tablet by mouth daily.    Marland Kitchen atorvastatin (LIPITOR) 10 MG tablet Take 1 tablet (10 mg total) by mouth daily. 90 tablet 3  . citalopram (CELEXA) 40 MG tablet Take 40 mg by mouth daily.     . Diabetic Sterile Lancets MISC by Does not apply route. Check glucose two times a day and as needed for out of control DM 250.00     . doxepin (SINEQUAN) 50 MG capsule Take 1 capsule by mouth at bedtime.  1  . ESTRADIOL VA Place vaginally 2 (two) times a week. Reported on 02/03/2015    . gabapentin (NEURONTIN) 300 MG capsule TAKE 1 CAPSULE TWICE A DAY 180 capsule 1  . glipiZIDE (GLUCOTROL XL) 5 MG 24 hr tablet Take 1 tablet (5 mg total) by mouth daily. 90 tablet 3  . glucose blood test strip 1 each by Other route. Check glucose two times a day and as needed for out of control DM 250.00     . ibuprofen (ADVIL,MOTRIN) 200 MG tablet Take 400 mg by mouth as directed. For pain    . levothyroxine (SYNTHROID, LEVOTHROID) 100 MCG tablet TAKE 1 TABLET DAILY IN THE MORNING 30 MINUTES BEFORE FOOD OR OTHER MEDICATION 90 tablet 1  . lisinopril (PRINIVIL,ZESTRIL) 5 MG tablet Take 0.5 tablets (2.5 mg total) by mouth daily. 45 tablet 3  . magic mouthwash w/lidocaine SOLN Take 5 mLs by mouth 3  (three) times daily. 150 mL 5  . metFORMIN (GLUCOPHAGE) 1000 MG tablet Take 1 tablet (1,000 mg total) by mouth 2 (two) times daily with a meal. 180 tablet 3  . VRAYLAR 3 MG CAPS Take 1 capsule  by mouth daily.    Marland Kitchen zolpidem (AMBIEN) 5 MG tablet Take 5 mg by mouth at bedtime as needed. For sleep    . [DISCONTINUED] cholestyramine (QUESTRAN) 4 G packet Mix with juice or water and drink every 1-3 days. 30 each 1   No current facility-administered medications on file prior to visit.     Review of Systems  Constitutional: Positive for fatigue. Negative for activity change, appetite change, fever and unexpected weight change.  HENT: Negative for congestion, ear pain, rhinorrhea, sinus pressure and sore throat.   Eyes: Negative for pain, redness and visual disturbance.  Respiratory: Negative for cough, shortness of breath and wheezing.   Cardiovascular: Negative for chest pain and palpitations.  Gastrointestinal: Negative for abdominal pain, blood in stool, constipation and diarrhea.  Endocrine: Negative for polydipsia and polyuria.  Genitourinary: Negative for dysuria, frequency and urgency.  Musculoskeletal: Positive for back pain. Negative for arthralgias and myalgias.  Skin: Negative for pallor and rash.  Allergic/Immunologic: Negative for environmental allergies.  Neurological: Negative for dizziness, syncope and headaches.  Hematological: Negative for adenopathy. Does not bruise/bleed easily.  Psychiatric/Behavioral: Negative for decreased concentration and dysphoric mood. The patient is not nervous/anxious.        Dep/anx are stable        Objective:   Physical Exam Constitutional:      General: She is not in acute distress.    Appearance: She is well-developed. She is obese. She is not ill-appearing.     Comments: obese and well appearing   HENT:     Head: Normocephalic and atraumatic.     Nose: Nose normal.     Mouth/Throat:     Mouth: Mucous membranes are moist.     Pharynx:  Oropharynx is clear.  Eyes:     Conjunctiva/sclera: Conjunctivae normal.     Pupils: Pupils are equal, round, and reactive to light.  Neck:     Musculoskeletal: Normal range of motion and neck supple.     Thyroid: No thyromegaly.     Vascular: No carotid bruit or JVD.  Cardiovascular:     Rate and Rhythm: Normal rate and regular rhythm.     Pulses: Normal pulses.     Heart sounds: Normal heart sounds. No gallop.   Pulmonary:     Effort: Pulmonary effort is normal. No respiratory distress.     Breath sounds: Normal breath sounds. No wheezing or rales.  Abdominal:     General: Bowel sounds are normal. There is no distension or abdominal bruit.     Palpations: Abdomen is soft. There is no mass.     Tenderness: There is no abdominal tenderness.  Musculoskeletal:     Right lower leg: No edema.     Left lower leg: No edema.     Comments: Limited rom of LS   Lymphadenopathy:     Cervical: No cervical adenopathy.  Skin:    General: Skin is warm and dry.     Capillary Refill: Capillary refill takes less than 2 seconds.     Findings: No lesion or rash.  Neurological:     General: No focal deficit present.     Mental Status: She is alert. Mental status is at baseline.     Coordination: Coordination normal.     Deep Tendon Reflexes: Reflexes are normal and symmetric. Reflexes normal.  Psychiatric:        Mood and Affect: Affect is blunt.        Behavior: Behavior is  not agitated, slowed or withdrawn.        Cognition and Memory: Cognition normal.     Comments: Baseline blunt affect            Assessment & Plan:   Problem List Items Addressed This Visit      Digestive   ADENOCARCINOMA, SIGMOID COLON    Recall colonoscopy due 4/20        Endocrine   Hypothyroidism    Re check after dose adjustment last time  Some fatigue (baseline) No other clinical changes      Relevant Orders   TSH (Completed)   Diabetes type 2, controlled ([place]) - Primary    Improved Lab Results   Component Value Date   HGBA1C 6.8 (A) 03/08/2018   Enc exercise as tol (back injury) Keep working on diet and wt loss  She will schedule her eye exam) Continue ace and statin  F/u 6 mo       Relevant Orders   POCT glycosylated hemoglobin (Hb A1C) (Completed)     Other   Hyperlipidemia    Disc goals for lipids and reasons to control them Rev last labs with pt Rev low sat fat diet in detail Labs today  Diet and statin       Relevant Orders   Lipid panel (Completed)   B12 deficiency    Lab Results  Component Value Date   VITAMINB12 >1500 (H) 09/12/2017   Will space shots to every 3 mo  inj today      Relevant Medications   cyanocobalamin ((VITAMIN B-12)) injection 1,000 mcg (Completed)   Obesity    Discussed how this problem influences overall health and the risks it imposes  Reviewed plan for weight loss with lower calorie diet (via better food choices and also portion control or program like weight watchers) and exercise building up to or more than 30 minutes 5 days per week including some aerobic activity

## 2018-03-08 NOTE — Assessment & Plan Note (Signed)
Improved Lab Results  Component Value Date   HGBA1C 6.8 (A) 03/08/2018   Enc exercise as tol (back injury) Keep working on diet and wt loss  She will schedule her eye exam) Continue ace and statin  F/u 6 mo

## 2018-03-08 NOTE — Assessment & Plan Note (Signed)
Discussed how this problem influences overall health and the risks it imposes  Reviewed plan for weight loss with lower calorie diet (via better food choices and also portion control or program like weight watchers) and exercise building up to or more than 30 minutes 5 days per week including some aerobic activity    

## 2018-03-08 NOTE — Assessment & Plan Note (Signed)
Recall colonoscopy due 4/20

## 2018-03-08 NOTE — Assessment & Plan Note (Signed)
Lab Results  Component Value Date   VITAMINB12 >1500 (H) 09/12/2017   Will space shots to every 3 mo  inj today

## 2018-03-11 MED ORDER — LEVOTHYROXINE SODIUM 112 MCG PO TABS: 112.0000 ug | ORAL_TABLET | Freq: Every day | ORAL | 2 refills | Status: DC

## 2018-03-11 NOTE — Addendum Note (Signed)
Addended by: Kris Mouton on: 03/11/2018 12:59 PM   Modules accepted: Orders

## 2018-03-13 ENCOUNTER — Ambulatory Visit: Payer: BLUE CROSS/BLUE SHIELD | Admitting: Family Medicine

## 2018-03-27 DIAGNOSIS — H5702 Anisocoria: Secondary | ICD-10-CM | POA: Diagnosis not present

## 2018-03-27 DIAGNOSIS — E119 Type 2 diabetes mellitus without complications: Secondary | ICD-10-CM | POA: Diagnosis not present

## 2018-03-27 LAB — HM DIABETES EYE EXAM

## 2018-04-04 ENCOUNTER — Encounter: Payer: Self-pay | Admitting: Family Medicine

## 2018-04-05 DIAGNOSIS — M1711 Unilateral primary osteoarthritis, right knee: Secondary | ICD-10-CM | POA: Diagnosis not present

## 2018-04-17 ENCOUNTER — Ambulatory Visit: Payer: Self-pay

## 2018-04-17 ENCOUNTER — Other Ambulatory Visit: Payer: Self-pay | Admitting: Family Medicine

## 2018-04-17 DIAGNOSIS — Z0000 Encounter for general adult medical examination without abnormal findings: Secondary | ICD-10-CM

## 2018-06-05 ENCOUNTER — Other Ambulatory Visit: Payer: Self-pay

## 2018-06-05 ENCOUNTER — Ambulatory Visit: Payer: BLUE CROSS/BLUE SHIELD

## 2018-06-10 DIAGNOSIS — F411 Generalized anxiety disorder: Secondary | ICD-10-CM | POA: Diagnosis not present

## 2018-06-10 DIAGNOSIS — F331 Major depressive disorder, recurrent, moderate: Secondary | ICD-10-CM | POA: Diagnosis not present

## 2018-06-10 DIAGNOSIS — F900 Attention-deficit hyperactivity disorder, predominantly inattentive type: Secondary | ICD-10-CM | POA: Diagnosis not present

## 2018-06-12 ENCOUNTER — Other Ambulatory Visit: Payer: Self-pay

## 2018-06-12 ENCOUNTER — Ambulatory Visit: Payer: BLUE CROSS/BLUE SHIELD

## 2018-06-19 ENCOUNTER — Ambulatory Visit (INDEPENDENT_AMBULATORY_CARE_PROVIDER_SITE_OTHER): Payer: BLUE CROSS/BLUE SHIELD

## 2018-06-19 DIAGNOSIS — E538 Deficiency of other specified B group vitamins: Secondary | ICD-10-CM | POA: Diagnosis not present

## 2018-06-19 MED ORDER — CYANOCOBALAMIN 1000 MCG/ML IJ SOLN
1000.0000 ug | Freq: Once | INTRAMUSCULAR | Status: AC
  Administered 2018-06-19: 1000 ug via INTRAMUSCULAR

## 2018-06-19 NOTE — Progress Notes (Signed)
Per orders of Dr. Glori Bickers, injection of Vitamin B12 given by Diamond Nickel, RN. Patient tolerated injection well.

## 2018-06-24 ENCOUNTER — Other Ambulatory Visit: Payer: Self-pay | Admitting: Family Medicine

## 2018-06-24 NOTE — Telephone Encounter (Signed)
F/u was on 03/08/18, last filled on 12/12/17 #180 caps with 1 additional refill, please advise

## 2018-07-02 DIAGNOSIS — F900 Attention-deficit hyperactivity disorder, predominantly inattentive type: Secondary | ICD-10-CM | POA: Diagnosis not present

## 2018-07-02 DIAGNOSIS — F3341 Major depressive disorder, recurrent, in partial remission: Secondary | ICD-10-CM | POA: Diagnosis not present

## 2018-07-02 DIAGNOSIS — E039 Hypothyroidism, unspecified: Secondary | ICD-10-CM | POA: Diagnosis not present

## 2018-07-02 DIAGNOSIS — F411 Generalized anxiety disorder: Secondary | ICD-10-CM | POA: Diagnosis not present

## 2018-07-17 DIAGNOSIS — F3341 Major depressive disorder, recurrent, in partial remission: Secondary | ICD-10-CM | POA: Diagnosis not present

## 2018-07-17 DIAGNOSIS — F900 Attention-deficit hyperactivity disorder, predominantly inattentive type: Secondary | ICD-10-CM | POA: Diagnosis not present

## 2018-07-17 DIAGNOSIS — F411 Generalized anxiety disorder: Secondary | ICD-10-CM | POA: Diagnosis not present

## 2018-07-23 DIAGNOSIS — Z6835 Body mass index (BMI) 35.0-35.9, adult: Secondary | ICD-10-CM | POA: Diagnosis not present

## 2018-07-23 DIAGNOSIS — Z803 Family history of malignant neoplasm of breast: Secondary | ICD-10-CM | POA: Diagnosis not present

## 2018-07-23 DIAGNOSIS — N200 Calculus of kidney: Secondary | ICD-10-CM | POA: Insufficient documentation

## 2018-07-23 DIAGNOSIS — Z8041 Family history of malignant neoplasm of ovary: Secondary | ICD-10-CM | POA: Diagnosis not present

## 2018-07-23 DIAGNOSIS — Z1231 Encounter for screening mammogram for malignant neoplasm of breast: Secondary | ICD-10-CM | POA: Diagnosis not present

## 2018-07-23 DIAGNOSIS — Z800 Family history of malignant neoplasm of digestive organs: Secondary | ICD-10-CM | POA: Diagnosis not present

## 2018-07-23 DIAGNOSIS — Z01419 Encounter for gynecological examination (general) (routine) without abnormal findings: Secondary | ICD-10-CM | POA: Diagnosis not present

## 2018-07-23 DIAGNOSIS — Z85038 Personal history of other malignant neoplasm of large intestine: Secondary | ICD-10-CM | POA: Diagnosis not present

## 2018-07-23 DIAGNOSIS — N952 Postmenopausal atrophic vaginitis: Secondary | ICD-10-CM | POA: Diagnosis not present

## 2018-07-25 DIAGNOSIS — F411 Generalized anxiety disorder: Secondary | ICD-10-CM | POA: Diagnosis not present

## 2018-07-25 DIAGNOSIS — F900 Attention-deficit hyperactivity disorder, predominantly inattentive type: Secondary | ICD-10-CM | POA: Diagnosis not present

## 2018-07-25 DIAGNOSIS — F3341 Major depressive disorder, recurrent, in partial remission: Secondary | ICD-10-CM | POA: Diagnosis not present

## 2018-07-26 DIAGNOSIS — Z13820 Encounter for screening for osteoporosis: Secondary | ICD-10-CM | POA: Diagnosis not present

## 2018-07-30 DIAGNOSIS — F3341 Major depressive disorder, recurrent, in partial remission: Secondary | ICD-10-CM | POA: Diagnosis not present

## 2018-07-30 DIAGNOSIS — F900 Attention-deficit hyperactivity disorder, predominantly inattentive type: Secondary | ICD-10-CM | POA: Diagnosis not present

## 2018-07-30 DIAGNOSIS — F411 Generalized anxiety disorder: Secondary | ICD-10-CM | POA: Diagnosis not present

## 2018-08-05 DIAGNOSIS — F411 Generalized anxiety disorder: Secondary | ICD-10-CM | POA: Diagnosis not present

## 2018-08-05 DIAGNOSIS — F3341 Major depressive disorder, recurrent, in partial remission: Secondary | ICD-10-CM | POA: Diagnosis not present

## 2018-08-05 DIAGNOSIS — F900 Attention-deficit hyperactivity disorder, predominantly inattentive type: Secondary | ICD-10-CM | POA: Diagnosis not present

## 2018-08-07 DIAGNOSIS — F3341 Major depressive disorder, recurrent, in partial remission: Secondary | ICD-10-CM | POA: Diagnosis not present

## 2018-08-07 DIAGNOSIS — F411 Generalized anxiety disorder: Secondary | ICD-10-CM | POA: Diagnosis not present

## 2018-08-07 DIAGNOSIS — F900 Attention-deficit hyperactivity disorder, predominantly inattentive type: Secondary | ICD-10-CM | POA: Diagnosis not present

## 2018-08-13 DIAGNOSIS — F411 Generalized anxiety disorder: Secondary | ICD-10-CM | POA: Diagnosis not present

## 2018-08-13 DIAGNOSIS — F900 Attention-deficit hyperactivity disorder, predominantly inattentive type: Secondary | ICD-10-CM | POA: Diagnosis not present

## 2018-08-13 DIAGNOSIS — F3341 Major depressive disorder, recurrent, in partial remission: Secondary | ICD-10-CM | POA: Diagnosis not present

## 2018-08-21 ENCOUNTER — Other Ambulatory Visit: Payer: Self-pay | Admitting: Family Medicine

## 2018-08-22 DIAGNOSIS — F3341 Major depressive disorder, recurrent, in partial remission: Secondary | ICD-10-CM | POA: Diagnosis not present

## 2018-08-22 DIAGNOSIS — F900 Attention-deficit hyperactivity disorder, predominantly inattentive type: Secondary | ICD-10-CM | POA: Diagnosis not present

## 2018-08-22 DIAGNOSIS — F411 Generalized anxiety disorder: Secondary | ICD-10-CM | POA: Diagnosis not present

## 2018-08-28 DIAGNOSIS — F900 Attention-deficit hyperactivity disorder, predominantly inattentive type: Secondary | ICD-10-CM | POA: Diagnosis not present

## 2018-08-28 DIAGNOSIS — F411 Generalized anxiety disorder: Secondary | ICD-10-CM | POA: Diagnosis not present

## 2018-08-28 DIAGNOSIS — F331 Major depressive disorder, recurrent, moderate: Secondary | ICD-10-CM | POA: Diagnosis not present

## 2018-09-03 DIAGNOSIS — Z9189 Other specified personal risk factors, not elsewhere classified: Secondary | ICD-10-CM | POA: Diagnosis not present

## 2018-09-12 DIAGNOSIS — F900 Attention-deficit hyperactivity disorder, predominantly inattentive type: Secondary | ICD-10-CM | POA: Diagnosis not present

## 2018-09-12 DIAGNOSIS — F411 Generalized anxiety disorder: Secondary | ICD-10-CM | POA: Diagnosis not present

## 2018-09-12 DIAGNOSIS — F3341 Major depressive disorder, recurrent, in partial remission: Secondary | ICD-10-CM | POA: Diagnosis not present

## 2018-09-15 ENCOUNTER — Telehealth: Payer: Self-pay | Admitting: Family Medicine

## 2018-09-15 DIAGNOSIS — E538 Deficiency of other specified B group vitamins: Secondary | ICD-10-CM

## 2018-09-15 DIAGNOSIS — E119 Type 2 diabetes mellitus without complications: Secondary | ICD-10-CM

## 2018-09-15 DIAGNOSIS — E039 Hypothyroidism, unspecified: Secondary | ICD-10-CM

## 2018-09-15 DIAGNOSIS — E559 Vitamin D deficiency, unspecified: Secondary | ICD-10-CM

## 2018-09-15 DIAGNOSIS — E7800 Pure hypercholesterolemia, unspecified: Secondary | ICD-10-CM

## 2018-09-15 DIAGNOSIS — D649 Anemia, unspecified: Secondary | ICD-10-CM

## 2018-09-15 NOTE — Telephone Encounter (Signed)
-----   Message from Cloyd Stagers, RT sent at 09/12/2018 12:47 PM EDT ----- Regarding: Lab Orders for Monday 8.3.2020 Please place lab orders for Monday 8.3.2020, office visit for physical on Wednesday 8.5.2020 Thank you, Dyke Maes RT(R)

## 2018-09-16 ENCOUNTER — Other Ambulatory Visit (INDEPENDENT_AMBULATORY_CARE_PROVIDER_SITE_OTHER): Payer: BC Managed Care – PPO

## 2018-09-16 ENCOUNTER — Other Ambulatory Visit: Payer: BC Managed Care – PPO

## 2018-09-16 ENCOUNTER — Other Ambulatory Visit: Payer: Self-pay

## 2018-09-16 DIAGNOSIS — E119 Type 2 diabetes mellitus without complications: Secondary | ICD-10-CM | POA: Diagnosis not present

## 2018-09-16 DIAGNOSIS — E538 Deficiency of other specified B group vitamins: Secondary | ICD-10-CM

## 2018-09-16 DIAGNOSIS — D649 Anemia, unspecified: Secondary | ICD-10-CM

## 2018-09-16 DIAGNOSIS — E7800 Pure hypercholesterolemia, unspecified: Secondary | ICD-10-CM

## 2018-09-16 DIAGNOSIS — E039 Hypothyroidism, unspecified: Secondary | ICD-10-CM | POA: Diagnosis not present

## 2018-09-16 DIAGNOSIS — E559 Vitamin D deficiency, unspecified: Secondary | ICD-10-CM | POA: Diagnosis not present

## 2018-09-16 LAB — CBC WITH DIFFERENTIAL/PLATELET
Basophils Absolute: 0.1 10*3/uL (ref 0.0–0.1)
Basophils Relative: 1.1 % (ref 0.0–3.0)
Eosinophils Absolute: 0.2 10*3/uL (ref 0.0–0.7)
Eosinophils Relative: 2.5 % (ref 0.0–5.0)
HCT: 37.5 % (ref 36.0–46.0)
Hemoglobin: 12.2 g/dL (ref 12.0–15.0)
Lymphocytes Relative: 29.2 % (ref 12.0–46.0)
Lymphs Abs: 2.3 10*3/uL (ref 0.7–4.0)
MCHC: 32.5 g/dL (ref 30.0–36.0)
MCV: 94.6 fl (ref 78.0–100.0)
Monocytes Absolute: 0.2 10*3/uL (ref 0.1–1.0)
Monocytes Relative: 3.1 % (ref 3.0–12.0)
Neutro Abs: 5 10*3/uL (ref 1.4–7.7)
Neutrophils Relative %: 64.1 % (ref 43.0–77.0)
Platelets: 335 10*3/uL (ref 150.0–400.0)
RBC: 3.97 Mil/uL (ref 3.87–5.11)
RDW: 14.1 % (ref 11.5–15.5)
WBC: 7.8 10*3/uL (ref 4.0–10.5)

## 2018-09-16 LAB — COMPREHENSIVE METABOLIC PANEL
ALT: 31 U/L (ref 0–35)
AST: 30 U/L (ref 0–37)
Albumin: 4.1 g/dL (ref 3.5–5.2)
Alkaline Phosphatase: 86 U/L (ref 39–117)
BUN: 21 mg/dL (ref 6–23)
CO2: 28 mEq/L (ref 19–32)
Calcium: 10.1 mg/dL (ref 8.4–10.5)
Chloride: 103 mEq/L (ref 96–112)
Creatinine, Ser: 0.97 mg/dL (ref 0.40–1.20)
GFR: 59.45 mL/min — ABNORMAL LOW (ref >60.00)
Glucose, Bld: 120 mg/dL — ABNORMAL HIGH (ref 70–99)
Potassium: 4.6 mEq/L (ref 3.5–5.1)
Sodium: 140 mEq/L (ref 135–145)
Total Bilirubin: 0.3 mg/dL (ref 0.2–1.2)
Total Protein: 6.7 g/dL (ref 6.0–8.3)

## 2018-09-16 LAB — LIPID PANEL
Cholesterol: 183 mg/dL (ref 0–200)
HDL: 42.3 mg/dL (ref >39.00)
NonHDL: 140.87
Total CHOL/HDL Ratio: 4
Triglycerides: 289 mg/dL — ABNORMAL HIGH (ref 0.0–149.0)
VLDL: 57.8 mg/dL — ABNORMAL HIGH (ref 0.0–40.0)

## 2018-09-16 LAB — VITAMIN D 25 HYDROXY (VIT D DEFICIENCY, FRACTURES): VITD: 45.69 ng/mL (ref 30.00–100.00)

## 2018-09-16 LAB — HEMOGLOBIN A1C: Hgb A1c MFr Bld: 7.3 % — ABNORMAL HIGH (ref 4.6–6.5)

## 2018-09-16 LAB — TSH: TSH: 5.78 u[IU]/mL — ABNORMAL HIGH (ref 0.35–4.50)

## 2018-09-16 LAB — VITAMIN B12: Vitamin B-12: 204 pg/mL — ABNORMAL LOW (ref 211–911)

## 2018-09-16 LAB — LDL CHOLESTEROL, DIRECT: Direct LDL: 105 mg/dL

## 2018-09-18 ENCOUNTER — Encounter: Payer: Self-pay | Admitting: Family Medicine

## 2018-09-18 ENCOUNTER — Other Ambulatory Visit: Payer: Self-pay

## 2018-09-18 ENCOUNTER — Ambulatory Visit (INDEPENDENT_AMBULATORY_CARE_PROVIDER_SITE_OTHER): Payer: BC Managed Care – PPO | Admitting: Family Medicine

## 2018-09-18 VITALS — BP 92/58 | HR 92 | Temp 98.2°F | Ht 62.0 in | Wt 205.4 lb

## 2018-09-18 DIAGNOSIS — E119 Type 2 diabetes mellitus without complications: Secondary | ICD-10-CM

## 2018-09-18 DIAGNOSIS — E538 Deficiency of other specified B group vitamins: Secondary | ICD-10-CM

## 2018-09-18 DIAGNOSIS — C187 Malignant neoplasm of sigmoid colon: Secondary | ICD-10-CM

## 2018-09-18 DIAGNOSIS — E559 Vitamin D deficiency, unspecified: Secondary | ICD-10-CM

## 2018-09-18 DIAGNOSIS — E039 Hypothyroidism, unspecified: Secondary | ICD-10-CM

## 2018-09-18 DIAGNOSIS — F418 Other specified anxiety disorders: Secondary | ICD-10-CM

## 2018-09-18 DIAGNOSIS — E7800 Pure hypercholesterolemia, unspecified: Secondary | ICD-10-CM

## 2018-09-18 DIAGNOSIS — R809 Proteinuria, unspecified: Secondary | ICD-10-CM

## 2018-09-18 DIAGNOSIS — Z6837 Body mass index (BMI) 37.0-37.9, adult: Secondary | ICD-10-CM

## 2018-09-18 DIAGNOSIS — Z0000 Encounter for general adult medical examination without abnormal findings: Secondary | ICD-10-CM

## 2018-09-18 DIAGNOSIS — R208 Other disturbances of skin sensation: Secondary | ICD-10-CM

## 2018-09-18 MED ORDER — ATORVASTATIN CALCIUM 10 MG PO TABS: 10.0000 mg | ORAL_TABLET | Freq: Every day | ORAL | 3 refills | Status: DC

## 2018-09-18 MED ORDER — GABAPENTIN 300 MG PO CAPS: 300.0000 mg | ORAL_CAPSULE | Freq: Two times a day (BID) | ORAL | 3 refills | Status: DC

## 2018-09-18 MED ORDER — GLIPIZIDE ER 5 MG PO TB24: 5.0000 mg | ORAL_TABLET | Freq: Every day | ORAL | 3 refills | Status: DC

## 2018-09-18 MED ORDER — METFORMIN HCL 1000 MG PO TABS: 1000.0000 mg | ORAL_TABLET | Freq: Two times a day (BID) | ORAL | 3 refills | Status: DC

## 2018-09-18 MED ORDER — LISINOPRIL 5 MG PO TABS: 2.5000 mg | ORAL_TABLET | Freq: Every day | ORAL | 3 refills | Status: DC

## 2018-09-18 MED ORDER — LEVOTHYROXINE SODIUM 112 MCG PO TABS: 112.0000 ug | ORAL_TABLET | Freq: Every day | ORAL | 3 refills | Status: DC

## 2018-09-18 MED ORDER — CYANOCOBALAMIN 1000 MCG/ML IJ SOLN
1000.0000 ug | Freq: Once | INTRAMUSCULAR | Status: AC
  Administered 2018-09-18: 1000 ug via INTRAMUSCULAR

## 2018-09-18 NOTE — Progress Notes (Signed)
Subjective:    Patient ID: Kelli Cruz, female    DOB: 08-17-1960, 58 y.o.   MRN: [id number]  HPI  Here for health maintenance exam and to review chronic medical problems    Lost her job in June -has been tough on her  Actively looking for work  She is EMT and env science   Taking fair care of herself   Wt Readings from Last 3 Encounters:  09/18/18 205 lb 7 oz (93.2 kg)  03/08/18 208 lb (94.3 kg)  09/12/17 204 lb 4 oz (92.6 kg)  wt is down 3 lb Appetite is ok  Exercise- is able to work in [employer] pool several times a week  Needs to get in shape -plans to start walking  37.58 kg/m   Diet is up and down  Burning tongue is worse at night-that is when she wants to snack  Worse lately  Tries to chew gum    [facility]  Had some genetic testing done - trace genes/ watching closer  Self breast exam   Flu vaccine 10/19  Tdap 6/17    Colonoscopy 9/15 with 5 y recall   Eye exam 2/20 -no retinopathy   Pap 6/20 at gyn (will send for report) Has had a hysterectomy  Also dexa- no change from 9 y ago   pna vaccine 7/19   DM2 Lab Results  Component Value Date   HGBA1C 7.3 (H) 09/16/2018  metformin 1000 mg bid and glipizide xl 5 mg  Up from 6.8 in January  On ace On statin  She is snacking at night and eats the wrong things    Hypothyroidism  Pt has no clinical changes No change in energy level/ hair or skin/ edema and no tremor Lab Results  Component Value Date   TSH 5.78 (H) 09/16/2018    Taking 112 mcg daily  She takes it with her vit D and some of her other medicines  ? If med is absorbing    B12 def Lab Results  Component Value Date   VITAMINB12 204 (L) 09/16/2018  this was high a year ago  Shot every 3 mo - not quite every 3 months  Due for one now     Hyperlipidemia Lab Results  Component Value Date   CHOL 183 09/16/2018   CHOL 147 03/08/2018   CHOL 146 09/12/2017   Lab Results  Component Value Date   HDL 42.30 09/16/2018   HDL 43.50 03/08/2018   HDL 44.10 09/12/2017   Lab Results  Component Value Date   LDLCALC 75 03/08/2018   LDLCALC 65 09/12/2017   LDLCALC 100 (H) 04/11/2017   Lab Results  Component Value Date   TRIG 289.0 (H) 09/16/2018   TRIG 139.0 03/08/2018   TRIG 188.0 (H) 09/12/2017   Lab Results  Component Value Date   CHOLHDL 4 09/16/2018   CHOLHDL 3 03/08/2018   CHOLHDL 3 09/12/2017   Lab Results  Component Value Date   LDLDIRECT 105.0 09/16/2018   LDLDIRECT 147.2 12/11/2008   LDLDIRECT 121.9 09/07/2008   Atorvastatin and diet  Trig went up -sugar went up    Vit D level is 45.6  Dep/anx - sees psychiatry  Hard time with the way she was let go from work  Has a very supportive husband    Patient Active Problem List   Diagnosis Date Noted  . Anemia 02/19/2017  . Vitamin D deficiency 02/19/2017  . Knee pain, right 11/10/2016  .  Proteinuria 02/18/2016  . Exposure to communicable disease 08/04/2015  . Obesity 02/04/2015  . Hypersomnia with sleep apnea 01/06/2015  . Insomnia due to mental condition   . Tingling 08/12/2014  . Burning sensation of mouth 08/12/2014  . Numbness 10/24/2011  . Routine general medical examination at a health care facility 10/24/2011  . TMJ arthralgia 08/14/2011  . Poor posture 07/26/2011  . B12 deficiency 04/13/2010  . ADENOCARCINOMA, SIGMOID COLON 08/11/2008  . Hyperlipidemia 07/08/2008  . Hypothyroidism 09/12/2006  . Diabetes type 2, controlled ([place]) 09/12/2006  . Depression with anxiety 09/12/2006  . ALLERGIC RHINITIS, SEASONAL 09/12/2006  . FIBROCYSTIC BREAST DISEASE 09/12/2006  . MIGRAINES, HX OF 09/12/2006   Past Medical History:  Diagnosis Date  . Anxiety   . Bipolar 1 disorder ([place])   . Depression   . Diabetes mellitus    type II  . Endometriosis   . Family history of malignant neoplasm of gastrointestinal tract   . Ganglion cyst   . Hypothyroidism   . Insomnia due to mental condition    Past  Surgical History:  Procedure Laterality Date  . ABDOMINAL HYSTERECTOMY    . ABDOMINAL HYSTERECTOMY    . COLON RESECTION  June 2010  . COLON SURGERY    . COLONOSCOPY    . DILATION AND CURETTAGE OF UTERUS  2006   miscarriage   . GANGLION CYST EXCISION    . HERNIA REPAIR    . INTERSTIM IMPLANT PLACEMENT Left 12/11/2012   stimulator is on the left but the electrodes go to the right  . KNEE DISLOCATION SURGERY    . LAPAROSCOPY  1997   D & C for endometriosis   Social History   Tobacco Use  . Smoking status: Passive Smoke Exposure - Never Smoker  . Smokeless tobacco: Never Used  . Tobacco comment: husband quit smoking  Substance Use Topics  . Alcohol use: Yes    Alcohol/week: 0.0 standard drinks    Comment: socially twice a month at most or less  . Drug use: No   Family History  Problem Relation Age of Onset  . Breast cancer Mother   . Diabetes Mother   . Coronary artery disease Mother   . Kidney disease Mother        renal insufficiency  . Hyperlipidemia Mother   . Diabetes Father   . Coronary artery disease Father   . Colon cancer Father   . Colon cancer Paternal Grandmother   . Breast cancer Maternal Aunt   . Breast cancer Paternal Aunt    Allergies  Allergen Reactions  . Hydrocodone     REACTION: nausea  . Milk-Related Compounds Nausea And Vomiting  . Mirabegron     Trouble breathing and swollen tongue   Current Outpatient Medications on File Prior to Visit  Medication Sig Dispense Refill  . acetaminophen (TYLENOL) 325 MG tablet Take 650 mg by mouth every 6 (six) hours as needed. For pain    . ALPRAZolam (XANAX) 0.5 MG tablet Take 0.5 mg by mouth at bedtime as needed. For anxiety    . amphetamine-dextroamphetamine (ADDERALL XR) 30 MG 24 hr capsule Take 30 mg by mouth every morning.    Marland Kitchen amphetamine-dextroamphetamine (ADDERALL) 15 MG tablet Take 1 tablet by mouth daily.    . Cholecalciferol (VITAMIN D3) 1.25 MG (50000 UT) TABS Take by mouth.    . citalopram  (CELEXA) 40 MG tablet Take 40 mg by mouth daily.     . cyanocobalamin (,VITAMIN B-12,) 1000 MCG/ML  injection Inject 1 mL (1,000 mcg total) into the muscle every 3 (three) months. every 8 weeks 1 mL 5  . Diabetic Sterile Lancets MISC by Does not apply route. Check glucose two times a day and as needed for out of control DM 250.00     . doxepin (SINEQUAN) 50 MG capsule Take 1 capsule by mouth at bedtime.  1  . ESTRADIOL VA Place vaginally 2 (two) times a week. Reported on 02/03/2015    . glucose blood test strip 1 each by Other route. Check glucose two times a day and as needed for out of control DM 250.00     . ibuprofen (ADVIL,MOTRIN) 200 MG tablet Take 400 mg by mouth as directed. For pain    . VRAYLAR 3 MG CAPS Take 1 capsule by mouth daily.    Marland Kitchen zolpidem (AMBIEN) 5 MG tablet Take 5 mg by mouth at bedtime as needed. For sleep    . [DISCONTINUED] cholestyramine (QUESTRAN) 4 G packet Mix with juice or water and drink every 1-3 days. 30 each 1   No current facility-administered medications on file prior to visit.     Review of Systems  Constitutional: Negative for activity change, appetite change, fatigue, fever and unexpected weight change.  HENT: Negative for congestion, ear pain, rhinorrhea, sinus pressure and sore throat.   Eyes: Negative for pain, redness and visual disturbance.  Respiratory: Negative for cough, shortness of breath and wheezing.   Cardiovascular: Negative for chest pain and palpitations.  Gastrointestinal: Negative for abdominal pain, blood in stool, constipation and diarrhea.  Endocrine: Negative for polydipsia and polyuria.  Genitourinary: Negative for dysuria, frequency and urgency.  Musculoskeletal: Negative for arthralgias, back pain and myalgias.  Skin: Negative for pallor and rash.       Dry skin on feet Some moles on face  Allergic/Immunologic: Negative for environmental allergies.  Neurological: Negative for dizziness, syncope and headaches.  Hematological:  Negative for adenopathy. Does not bruise/bleed easily.  Psychiatric/Behavioral: Positive for dysphoric mood. Negative for decreased concentration. The patient is not nervous/anxious.        Objective:   Physical Exam Constitutional:      General: She is not in acute distress.    Appearance: Normal appearance. She is well-developed. She is obese. She is not ill-appearing or diaphoretic.  HENT:     Head: Normocephalic and atraumatic.     Right Ear: Tympanic membrane, ear canal and external ear normal.     Left Ear: Tympanic membrane, ear canal and external ear normal.     Nose: Nose normal. No rhinorrhea.     Mouth/Throat:     Mouth: Mucous membranes are moist.     Pharynx: Oropharynx is clear. No posterior oropharyngeal erythema.  Eyes:     General: No scleral icterus.       Right eye: No discharge.        Left eye: No discharge.     Conjunctiva/sclera: Conjunctivae normal.     Pupils: Pupils are equal, round, and reactive to light.  Neck:     Musculoskeletal: Normal range of motion and neck supple. No neck rigidity or muscular tenderness.     Thyroid: No thyromegaly.     Vascular: No carotid bruit or JVD.  Cardiovascular:     Rate and Rhythm: Regular rhythm. Tachycardia present.     Pulses: Normal pulses.     Heart sounds: Normal heart sounds. No gallop.   Pulmonary:     Effort: Pulmonary effort is normal.  No respiratory distress.     Breath sounds: Normal breath sounds. No wheezing or rales.  Abdominal:     General: Bowel sounds are normal. There is no distension.     Palpations: Abdomen is soft. There is no mass.     Tenderness: There is no abdominal tenderness.     Hernia: No hernia is present.  Genitourinary:    Comments: Pt sees gyn for pelvic and breast exam Musculoskeletal:        General: No tenderness or signs of injury.     Right lower leg: No edema.     Left lower leg: No edema.     Comments: No kyphosis   Lymphadenopathy:     Cervical: No cervical  adenopathy.  Skin:    General: Skin is warm and dry.     Coloration: Skin is not pale.     Findings: No erythema or rash.     Comments: Solar lentigines diffusely Few sks on L face (small)  [place]edone on back  Small area of apocrine hyperplasia R face (1-2 mm)  Neurological:     Mental Status: She is alert.     Cranial Nerves: No cranial nerve deficit.     Motor: No weakness, tremor or abnormal muscle tone.     Coordination: Coordination normal.     Gait: Gait normal.     Deep Tendon Reflexes: Reflexes are normal and symmetric.  Psychiatric:        Mood and Affect: Mood normal. Mood is not anxious. Affect is blunt.        Cognition and Memory: Cognition and memory normal.     Comments: Mildly blunt affect            Assessment & Plan:   Problem List Items Addressed This Visit      Digestive   ADENOCARCINOMA, SIGMOID COLON    Due for 5y recall colonoscopy  Referral done       Relevant Orders   Ambulatory referral to Gastroenterology     Endocrine   Hypothyroidism    Lab Results  Component Value Date   TSH 5.78 (H) 09/16/2018   Pt is taking her levothyroxine with her supplements This may impair absorbtion  Will change to am dosing separated from food and supplements and medications Re check 3 mo at f/u      Relevant Medications   levothyroxine (SYNTHROID) 112 MCG tablet   Diabetes type 2, controlled ([place])    Lab Results  Component Value Date   HGBA1C 7.3 (H) 09/16/2018  this is up  Eating more/snacking  Plans on better diet and exercise  Taking ace and statin  Eye exam 2/20  Nl foot exam -recommend moisturizers F/u 3 mo        Relevant Medications   lisinopril (ZESTRIL) 5 MG tablet   metFORMIN (GLUCOPHAGE) 1000 MG tablet   atorvastatin (LIPITOR) 10 MG tablet   glipiZIDE (GLUCOTROL XL) 5 MG 24 hr tablet     Other   Hyperlipidemia    Disc goals for lipids and reasons to control them Rev last labs with pt Rev low sat fat diet in detail LDL of  75 with atorvastatin and diet         Relevant Medications   lisinopril (ZESTRIL) 5 MG tablet   atorvastatin (LIPITOR) 10 MG tablet   Depression with anxiety    Seeing psychiatry  More depression since loosing job-but overall pt thinks she is dealing as well as possible Enc  self care       B12 deficiency    Lab Results  Component Value Date   VITAMINB12 204 (L) 09/16/2018   Will inc freq of shots to monthly -with one today  Re check level at 3 mo f/u  This should help her burning mouth symptoms       Routine general medical examination at a health care facility - Primary    Reviewed health habits including diet and exercise and skin cancer prevention Reviewed appropriate screening tests for age  Also reviewed health mt list, fam hx and immunization status , as well as social and family history   See HPI  Labs reviewed  B12 shot today  Counseled on diet/exercise for DM and obesity  Sent for mammogram[facility]pap [facility]       Burning sensation of mouth    B12 is low-making this worse  Will bump shots up to monthly       Obesity    Discussed how this problem influences overall health and the risks it imposes  Reviewed plan for weight loss with lower calorie diet (via better food choices and also portion control or program like weight watchers) and exercise building up to or more than 30 minutes 5 days per week including some aerobic activity         Relevant Medications   metFORMIN (GLUCOPHAGE) 1000 MG tablet   glipiZIDE (GLUCOTROL XL) 5 MG 24 hr tablet   Proteinuria    No longer seeing renal but has a urologist who checks her urine      Vitamin D deficiency    Level of 45.6  Vitamin D level is therapeutic with current supplementation Disc importance of this to bone and overall health

## 2018-09-18 NOTE — Assessment & Plan Note (Signed)
Disc goals for lipids and reasons to control them Rev last labs with pt Rev low sat fat diet in detail LDL of 75 with atorvastatin and diet

## 2018-09-18 NOTE — Assessment & Plan Note (Signed)
Discussed how this problem influences overall health and the risks it imposes  Reviewed plan for weight loss with lower calorie diet (via better food choices and also portion control or program like weight watchers) and exercise building up to or more than 30 minutes 5 days per week including some aerobic activity    

## 2018-09-18 NOTE — Assessment & Plan Note (Signed)
Lab Results  Component Value Date   TSH 5.78 (H) 09/16/2018   Pt is taking her levothyroxine with her supplements This may impair absorbtion  Will change to am dosing separated from food and supplements and medications Re check 3 mo at f/u

## 2018-09-18 NOTE — Assessment & Plan Note (Signed)
Lab Results  Component Value Date   HGBA1C 7.3 (H) 09/16/2018  this is up  Eating more/snacking  Plans on better diet and exercise  Taking ace and statin  Eye exam 2/20  Nl foot exam -recommend moisturizers F/u 3 mo

## 2018-09-18 NOTE — Assessment & Plan Note (Signed)
Seeing psychiatry  More depression since loosing job-but overall pt thinks she is dealing as well as possible Enc self care

## 2018-09-18 NOTE — Assessment & Plan Note (Signed)
Due for 5y recall colonoscopy  Referral done

## 2018-09-18 NOTE — Assessment & Plan Note (Signed)
B12 is low-making this worse  Will bump shots up to monthly

## 2018-09-18 NOTE — Assessment & Plan Note (Signed)
Lab Results  Component Value Date   VITAMINB12 204 (L) 09/16/2018   Will inc freq of shots to monthly -with one today  Re check level at 3 mo f/u  This should help her burning mouth symptoms

## 2018-09-18 NOTE — Assessment & Plan Note (Signed)
No longer seeing renal but has a urologist who checks her urine

## 2018-09-18 NOTE — Patient Instructions (Addendum)
For conditioning - get out and walk first thing in the am   B12 shot today   Let's go back to a B12 shot monthly -your level is low  This could make your burning mouth worse  Find out what is convenient re: shots   I want to re check this in 3 months at your follow up   The office will call you regarding colonscopy referral   Get a flu shot in the fall   Diabetes is worse  Work on your diet and exercise  Follow up in 3 months  Try to get most of your carbohydrates from produce (with the exception of white potatoes)  Eat less bread/pasta/rice/snack foods/cereals/sweets and other items from the middle of the grocery store (processed carbs)  We will re check thyroid in 3 months  Take your thyroid medicine 1 hour away from food/medicines and supplements   Get some vitamin B12 over the counter - take 1000 mcg per day   We will schedule you for another B12 shot in a month

## 2018-09-18 NOTE — Assessment & Plan Note (Signed)
Reviewed health habits including diet and exercise and skin cancer prevention Reviewed appropriate screening tests for age  Also reviewed health mt list, fam hx and immunization status , as well as social and family history   See HPI  Labs reviewed  B12 shot today  Counseled on diet/exercise for DM and obesity  Sent for mammogram/pap [facility]

## 2018-09-18 NOTE — Assessment & Plan Note (Signed)
Level of 45.6  Vitamin D level is therapeutic with current supplementation Disc importance of this to bone and overall health

## 2018-09-19 ENCOUNTER — Encounter: Payer: Self-pay | Admitting: Gastroenterology

## 2018-09-19 DIAGNOSIS — F900 Attention-deficit hyperactivity disorder, predominantly inattentive type: Secondary | ICD-10-CM | POA: Diagnosis not present

## 2018-09-19 DIAGNOSIS — F3341 Major depressive disorder, recurrent, in partial remission: Secondary | ICD-10-CM | POA: Diagnosis not present

## 2018-09-19 DIAGNOSIS — F411 Generalized anxiety disorder: Secondary | ICD-10-CM | POA: Diagnosis not present

## 2018-09-26 DIAGNOSIS — F411 Generalized anxiety disorder: Secondary | ICD-10-CM | POA: Diagnosis not present

## 2018-09-26 DIAGNOSIS — F900 Attention-deficit hyperactivity disorder, predominantly inattentive type: Secondary | ICD-10-CM | POA: Diagnosis not present

## 2018-09-26 DIAGNOSIS — F3341 Major depressive disorder, recurrent, in partial remission: Secondary | ICD-10-CM | POA: Diagnosis not present

## 2018-10-03 ENCOUNTER — Other Ambulatory Visit: Payer: Self-pay

## 2018-10-03 ENCOUNTER — Ambulatory Visit (AMBULATORY_SURGERY_CENTER): Payer: Self-pay

## 2018-10-03 VITALS — Ht 62.0 in | Wt 204.4 lb

## 2018-10-03 DIAGNOSIS — F900 Attention-deficit hyperactivity disorder, predominantly inattentive type: Secondary | ICD-10-CM | POA: Diagnosis not present

## 2018-10-03 DIAGNOSIS — F3341 Major depressive disorder, recurrent, in partial remission: Secondary | ICD-10-CM | POA: Diagnosis not present

## 2018-10-03 DIAGNOSIS — F411 Generalized anxiety disorder: Secondary | ICD-10-CM | POA: Diagnosis not present

## 2018-10-03 DIAGNOSIS — C187 Malignant neoplasm of sigmoid colon: Secondary | ICD-10-CM

## 2018-10-03 MED ORDER — NA SULFATE-K SULFATE-MG SULF 17.5-3.13-1.6 GM/177ML PO SOLN: 1.0000 | Freq: Once | ORAL | 0 refills | Status: AC

## 2018-10-03 NOTE — Progress Notes (Signed)
Denies allergies to eggs or soy products. Denies complication of anesthesia or sedation. Denies use of weight loss medication. Denies use of O2.   Emmi instructions given for colonoscopy.  A 15.00 coupon for Suprep was given to the patient.  

## 2018-10-04 ENCOUNTER — Other Ambulatory Visit: Payer: Self-pay

## 2018-10-04 ENCOUNTER — Telehealth: Payer: Self-pay

## 2018-10-04 MED ORDER — ONETOUCH VERIO VI STRP: ORAL_STRIP | 12 refills | Status: DC

## 2018-10-04 MED ORDER — ONETOUCH VERIO FLEX SYSTEM W/DEVICE KIT: PACK | 12 refills | Status: DC

## 2018-10-04 MED ORDER — ONETOUCH FINEPOINT LANCETS MISC: 12 refills | Status: DC

## 2018-10-04 NOTE — Telephone Encounter (Signed)
Patient requests a new meter, hers broke. She is requesting One Touch Verio flex and supplies sent to [facility].

## 2018-10-04 NOTE — Telephone Encounter (Signed)
Patient advised.

## 2018-10-04 NOTE — Telephone Encounter (Signed)
Just stop the simvastatin and continue atorvastatin  Thanks

## 2018-10-04 NOTE — Telephone Encounter (Signed)
Patient called asking for refill on Simvastatin. I advised patient per chart this was stopped and switched to Atorvastatin in January 2019. Patient states she was not aware and pharmacy was filling both medications this whole time and she has been taking both Simvastatin and Atorvastatin at this time since 2019. Anything patient needs to do specific at this time besides not take Simvastatin? CB: [phone]

## 2018-10-07 ENCOUNTER — Encounter: Payer: Self-pay | Admitting: Gastroenterology

## 2018-10-08 ENCOUNTER — Telehealth: Payer: Self-pay | Admitting: Gastroenterology

## 2018-10-08 NOTE — Telephone Encounter (Signed)

## 2018-10-09 ENCOUNTER — Other Ambulatory Visit: Payer: Self-pay

## 2018-10-09 ENCOUNTER — Ambulatory Visit (AMBULATORY_SURGERY_CENTER): Payer: BC Managed Care – PPO | Admitting: Gastroenterology

## 2018-10-09 ENCOUNTER — Encounter: Payer: Self-pay | Admitting: Gastroenterology

## 2018-10-09 VITALS — BP 115/62 | HR 72 | Temp 95.0°F | Resp 11 | Ht 62.0 in | Wt 204.0 lb

## 2018-10-09 DIAGNOSIS — Z1211 Encounter for screening for malignant neoplasm of colon: Secondary | ICD-10-CM | POA: Diagnosis not present

## 2018-10-09 DIAGNOSIS — D124 Benign neoplasm of descending colon: Secondary | ICD-10-CM | POA: Diagnosis not present

## 2018-10-09 DIAGNOSIS — K635 Polyp of colon: Secondary | ICD-10-CM

## 2018-10-09 DIAGNOSIS — Z800 Family history of malignant neoplasm of digestive organs: Secondary | ICD-10-CM

## 2018-10-09 DIAGNOSIS — Z85038 Personal history of other malignant neoplasm of large intestine: Secondary | ICD-10-CM | POA: Diagnosis not present

## 2018-10-09 DIAGNOSIS — C187 Malignant neoplasm of sigmoid colon: Secondary | ICD-10-CM

## 2018-10-09 MED ORDER — SODIUM CHLORIDE 0.9 % IV SOLN: 500.0000 mL | Freq: Once | INTRAVENOUS | Status: DC

## 2018-10-09 NOTE — Op Note (Signed)
[place] Patient Name: Kelli Cruz Procedure Date: 10/09/2018 11:23 AM MRN: [id number] Endoscopist: Thornton Park MD, MD Age: 58 Referring MD:  Date of Birth: [birth year] Gender: Female Account #: [id number] Procedure:                Colonoscopy Indications:              High risk colon cancer surveillance: Personal                            history of colon cancer                           Personal history of colon cancer s/p resection 2010                           Last colonoscopy with Dr. Deatra Ina 10/24/13                           Father and paternal grandfather with colon cancer                            in their 25s Medicines:                See the Anesthesia note for documentation of the                            administered medications Procedure:                Pre-Anesthesia Assessment:                           - Prior to the procedure, a History and Physical                            was performed, and patient medications and                            allergies were reviewed. [name] tolerance of                            previous anesthesia was also reviewed. The risks                            and benefits of the procedure and the sedation                            options and risks were discussed with the patient.                            All questions were answered, and informed consent                            was obtained. Prior Anticoagulants: The patient has  taken no previous anticoagulant or antiplatelet                            agents. ASA Grade Assessment: II - A patient with                            mild systemic disease. After reviewing the risks                            and benefits, the patient was deemed in                            satisfactory condition to undergo the procedure.                           After obtaining informed consent, the colonoscope                            was  passed under direct vision. Throughout the                            procedure, the patient's blood pressure, pulse, and                            oxygen saturations were monitored continuously. The                            Colonoscope was introduced through the anus and                            advanced to the the terminal ileum, with                            identification of the appendiceal orifice and IC                            valve. A second forward view of the right colon was                            performed. The colonoscopy was performed without                            difficulty. The patient tolerated the procedure                            well. The quality of the bowel preparation was                            good. Anatomical landmarks were photographed. Scope In: 11:34:06 AM Scope Out: 11:48:04 AM Scope Withdrawal Time: 0 hours 10 minutes 56 seconds  Total Procedure Duration: 0 hours 13 minutes 58 seconds  Findings:                 The perianal and digital rectal examinations  were                            normal.                           Two sessile polyps were found in the descending                            colon. The polyps were 1 to 2 mm in size. These                            polyps were removed with a cold biopsy forceps.                            Resection and retrieval were complete. Estimated                            blood loss was minimal.                           The exam was otherwise without abnormality on                            direct and retroflexion views. Complications:            No immediate complications. Estimated blood loss:                            Minimal. Estimated Blood Loss:     Estimated blood loss was minimal. Impression:               - Two 1 to 2 mm polyps in the descending colon,                            removed with a cold biopsy forceps. Resected and                            retrieved.                            - The examination was otherwise normal on direct                            and retroflexion views. Recommendation:           - Patient has a contact number available for                            emergencies. The signs and symptoms of potential                            delayed complications were discussed with the                            patient. Return to normal activities tomorrow.  Written discharge instructions were provided to the                            patient.                           - Resume previous diet today.                           - Continue present medications.                           - Await pathology results.                           - Repeat colonoscopy in 5 years for surveillance                            given the personal history of colon cancer. Thornton Park MD, MD 10/09/2018 11:59:58 AM This report has been signed electronically.

## 2018-10-09 NOTE — Progress Notes (Signed)
Vitals: Kelli Cruz: June B

## 2018-10-09 NOTE — Progress Notes (Signed)
Report to PACU, RN, vss, BBS= Clear.  

## 2018-10-09 NOTE — Progress Notes (Signed)
Called to room to assist during endoscopic procedure.  Patient ID and intended procedure confirmed with present staff. Received instructions for my participation in the procedure from the performing physician.  

## 2018-10-09 NOTE — Progress Notes (Signed)
Pt's states no medical or surgical changes since previsit or office visit. 

## 2018-10-09 NOTE — Patient Instructions (Signed)
Please read handouts provided. Await pathology results. Continue present medications.      YOU HAD AN ENDOSCOPIC PROCEDURE TODAY AT THE [facility]:   Refer to the procedure report that was given to you for any specific questions about what was found during the examination.  If the procedure report does not answer your questions, please call your gastroenterologist to clarify.  If you requested that your care partner not be given the details of your procedure findings, then the procedure report has been included in a sealed envelope for you to review at your convenience later.  YOU SHOULD EXPECT: Some feelings of bloating in the abdomen. Passage of more gas than usual.  Walking can help get rid of the air that was put into your GI tract during the procedure and reduce the bloating. If you had a lower endoscopy (such as a colonoscopy or flexible sigmoidoscopy) you may notice spotting of blood in your stool or on the toilet paper. If you underwent a bowel prep for your procedure, you may not have a normal bowel movement for a few days.  Please Note:  You might notice some irritation and congestion in your nose or some drainage.  This is from the oxygen used during your procedure.  There is no need for concern and it should clear up in a day or so.  SYMPTOMS TO REPORT IMMEDIATELY:   Following lower endoscopy (colonoscopy or flexible sigmoidoscopy):  Excessive amounts of blood in the stool  Significant tenderness or worsening of abdominal pains  Swelling of the abdomen that is new, acute  Fever of 100F or higher    For urgent or emergent issues, a gastroenterologist can be reached at any hour by calling [phone].   DIET:  We do recommend a small meal at first, but then you may proceed to your regular diet.  Drink plenty of fluids but you should avoid alcoholic beverages for 24 hours.  ACTIVITY:  You should plan to take it easy for the rest of today and you should NOT  DRIVE or use heavy machinery until tomorrow (because of the sedation medicines used during the test).    FOLLOW UP: Our staff will call the number listed on your records 48-72 hours following your procedure to check on you and address any questions or concerns that you may have regarding the information given to you following your procedure. If we do not reach you, we will leave a message.  We will attempt to reach you two times.  During this call, we will ask if you have developed any symptoms of COVID 19. If you develop any symptoms (ie: fever, flu-like symptoms, shortness of breath, cough etc.) before then, please call [phone].  If you test positive for Covid 19 in the 2 weeks post procedure, please call and report this information to us.    If any biopsies were taken you will be contacted by phone or by letter within the next 1-3 weeks.  Please call us at [phone] if you have not heard about the biopsies in 3 weeks.    SIGNATURES/CONFIDENTIALITY: You and/or your care partner have signed paperwork which will be entered into your electronic medical record.  These signatures attest to the fact that that the information above on your After Visit Summary has been reviewed and is understood.  Full responsibility of the confidentiality of this discharge information lies with you and/or your care-partner. 

## 2018-10-11 ENCOUNTER — Telehealth: Payer: Self-pay

## 2018-10-11 NOTE — Telephone Encounter (Signed)
Second post procedure follow up call, no answer 

## 2018-10-11 NOTE — Telephone Encounter (Signed)
Called [phone] and the mailbox was full and could not leave a message that  we tried to reach pt for a follow up call. Maw  The number was incorrect in the call back area. maw

## 2018-10-15 ENCOUNTER — Encounter: Payer: Self-pay | Admitting: Gastroenterology

## 2018-10-22 DIAGNOSIS — M25461 Effusion, right knee: Secondary | ICD-10-CM | POA: Diagnosis not present

## 2018-10-22 DIAGNOSIS — M25561 Pain in right knee: Secondary | ICD-10-CM | POA: Diagnosis not present

## 2018-10-22 DIAGNOSIS — M25661 Stiffness of right knee, not elsewhere classified: Secondary | ICD-10-CM | POA: Diagnosis not present

## 2018-10-22 DIAGNOSIS — R262 Difficulty in walking, not elsewhere classified: Secondary | ICD-10-CM | POA: Diagnosis not present

## 2018-10-24 ENCOUNTER — Ambulatory Visit (INDEPENDENT_AMBULATORY_CARE_PROVIDER_SITE_OTHER): Payer: BC Managed Care – PPO

## 2018-10-24 DIAGNOSIS — M25561 Pain in right knee: Secondary | ICD-10-CM | POA: Diagnosis not present

## 2018-10-24 DIAGNOSIS — R262 Difficulty in walking, not elsewhere classified: Secondary | ICD-10-CM | POA: Diagnosis not present

## 2018-10-24 DIAGNOSIS — E538 Deficiency of other specified B group vitamins: Secondary | ICD-10-CM

## 2018-10-24 DIAGNOSIS — M25661 Stiffness of right knee, not elsewhere classified: Secondary | ICD-10-CM | POA: Diagnosis not present

## 2018-10-24 DIAGNOSIS — M25461 Effusion, right knee: Secondary | ICD-10-CM | POA: Diagnosis not present

## 2018-10-24 MED ORDER — CYANOCOBALAMIN 1000 MCG/ML IJ SOLN
1000.0000 ug | Freq: Once | INTRAMUSCULAR | Status: AC
  Administered 2018-10-24: 1000 ug via INTRAMUSCULAR

## 2018-10-24 NOTE — Progress Notes (Signed)
Per orders of Dr. Tower, injection of vit B12 given by Issabela Lesko. Patient tolerated injection well.  

## 2018-10-29 DIAGNOSIS — R262 Difficulty in walking, not elsewhere classified: Secondary | ICD-10-CM | POA: Diagnosis not present

## 2018-10-29 DIAGNOSIS — M25461 Effusion, right knee: Secondary | ICD-10-CM | POA: Diagnosis not present

## 2018-10-29 DIAGNOSIS — M25661 Stiffness of right knee, not elsewhere classified: Secondary | ICD-10-CM | POA: Diagnosis not present

## 2018-10-29 DIAGNOSIS — M25561 Pain in right knee: Secondary | ICD-10-CM | POA: Diagnosis not present

## 2018-11-01 ENCOUNTER — Encounter: Payer: BC Managed Care – PPO | Admitting: Gastroenterology

## 2018-11-06 DIAGNOSIS — F3341 Major depressive disorder, recurrent, in partial remission: Secondary | ICD-10-CM | POA: Diagnosis not present

## 2018-11-06 DIAGNOSIS — F411 Generalized anxiety disorder: Secondary | ICD-10-CM | POA: Diagnosis not present

## 2018-11-06 DIAGNOSIS — F900 Attention-deficit hyperactivity disorder, predominantly inattentive type: Secondary | ICD-10-CM | POA: Diagnosis not present

## 2018-11-07 DIAGNOSIS — M25661 Stiffness of right knee, not elsewhere classified: Secondary | ICD-10-CM | POA: Diagnosis not present

## 2018-11-07 DIAGNOSIS — M25461 Effusion, right knee: Secondary | ICD-10-CM | POA: Diagnosis not present

## 2018-11-07 DIAGNOSIS — R262 Difficulty in walking, not elsewhere classified: Secondary | ICD-10-CM | POA: Diagnosis not present

## 2018-11-07 DIAGNOSIS — M25561 Pain in right knee: Secondary | ICD-10-CM | POA: Diagnosis not present

## 2018-11-11 DIAGNOSIS — F411 Generalized anxiety disorder: Secondary | ICD-10-CM | POA: Diagnosis not present

## 2018-11-11 DIAGNOSIS — F900 Attention-deficit hyperactivity disorder, predominantly inattentive type: Secondary | ICD-10-CM | POA: Diagnosis not present

## 2018-11-11 DIAGNOSIS — F3341 Major depressive disorder, recurrent, in partial remission: Secondary | ICD-10-CM | POA: Diagnosis not present

## 2018-11-19 DIAGNOSIS — M25561 Pain in right knee: Secondary | ICD-10-CM | POA: Diagnosis not present

## 2018-11-19 DIAGNOSIS — R262 Difficulty in walking, not elsewhere classified: Secondary | ICD-10-CM | POA: Diagnosis not present

## 2018-11-19 DIAGNOSIS — M25661 Stiffness of right knee, not elsewhere classified: Secondary | ICD-10-CM | POA: Diagnosis not present

## 2018-11-19 DIAGNOSIS — M25461 Effusion, right knee: Secondary | ICD-10-CM | POA: Diagnosis not present

## 2018-11-27 DIAGNOSIS — F411 Generalized anxiety disorder: Secondary | ICD-10-CM | POA: Diagnosis not present

## 2018-11-27 DIAGNOSIS — F900 Attention-deficit hyperactivity disorder, predominantly inattentive type: Secondary | ICD-10-CM | POA: Diagnosis not present

## 2018-11-27 DIAGNOSIS — F3341 Major depressive disorder, recurrent, in partial remission: Secondary | ICD-10-CM | POA: Diagnosis not present

## 2018-11-29 ENCOUNTER — Telehealth: Payer: Self-pay | Admitting: *Deleted

## 2018-11-29 NOTE — Telephone Encounter (Signed)
Left message for patient to call back. When patient calls back she needs to have covid screening for nurse visit Tuesday and advised of curbside visit.

## 2018-12-03 ENCOUNTER — Ambulatory Visit (INDEPENDENT_AMBULATORY_CARE_PROVIDER_SITE_OTHER): Payer: BC Managed Care – PPO

## 2018-12-03 DIAGNOSIS — E538 Deficiency of other specified B group vitamins: Secondary | ICD-10-CM | POA: Diagnosis not present

## 2018-12-03 MED ORDER — CYANOCOBALAMIN 1000 MCG/ML IJ SOLN
1000.0000 ug | Freq: Once | INTRAMUSCULAR | Status: AC
  Administered 2018-12-03: 16:00:00 1000 ug via INTRAMUSCULAR

## 2018-12-03 NOTE — Progress Notes (Signed)
Per orders of Dr. Glori Bickers,  injection of monthly b12 given by Kris Mouton. Patient tolerated injection well.

## 2018-12-18 DIAGNOSIS — F900 Attention-deficit hyperactivity disorder, predominantly inattentive type: Secondary | ICD-10-CM | POA: Diagnosis not present

## 2018-12-18 DIAGNOSIS — F3341 Major depressive disorder, recurrent, in partial remission: Secondary | ICD-10-CM | POA: Diagnosis not present

## 2018-12-18 DIAGNOSIS — F411 Generalized anxiety disorder: Secondary | ICD-10-CM | POA: Diagnosis not present

## 2018-12-20 ENCOUNTER — Ambulatory Visit: Payer: BC Managed Care – PPO | Admitting: Family Medicine

## 2018-12-25 ENCOUNTER — Emergency Department (HOSPITAL_COMMUNITY)
Admission: EM | Admit: 2018-12-25 | Discharge: 2018-12-26 | Disposition: A | Discharge status: Home / Self Care | Payer: BC Managed Care – PPO | Attending: Emergency Medicine | Admitting: Emergency Medicine

## 2018-12-25 ENCOUNTER — Other Ambulatory Visit: Payer: Self-pay

## 2018-12-25 ENCOUNTER — Encounter (HOSPITAL_COMMUNITY): Payer: Self-pay | Admitting: *Deleted

## 2018-12-25 DIAGNOSIS — Z7722 Contact with and (suspected) exposure to environmental tobacco smoke (acute) (chronic): Secondary | ICD-10-CM | POA: Diagnosis not present

## 2018-12-25 DIAGNOSIS — N12 Tubulo-interstitial nephritis, not specified as acute or chronic: Secondary | ICD-10-CM | POA: Diagnosis not present

## 2018-12-25 DIAGNOSIS — E119 Type 2 diabetes mellitus without complications: Secondary | ICD-10-CM | POA: Insufficient documentation

## 2018-12-25 DIAGNOSIS — R112 Nausea with vomiting, unspecified: Secondary | ICD-10-CM | POA: Diagnosis not present

## 2018-12-25 DIAGNOSIS — E039 Hypothyroidism, unspecified: Secondary | ICD-10-CM | POA: Insufficient documentation

## 2018-12-25 DIAGNOSIS — Z7984 Long term (current) use of oral hypoglycemic drugs: Secondary | ICD-10-CM | POA: Insufficient documentation

## 2018-12-25 DIAGNOSIS — Z79899 Other long term (current) drug therapy: Secondary | ICD-10-CM | POA: Insufficient documentation

## 2018-12-25 DIAGNOSIS — R109 Unspecified abdominal pain: Secondary | ICD-10-CM | POA: Diagnosis not present

## 2018-12-25 LAB — COMPREHENSIVE METABOLIC PANEL
ALT: 16 U/L (ref 0–44)
AST: 16 U/L (ref 15–41)
Albumin: 2.8 g/dL — ABNORMAL LOW (ref 3.5–5.0)
Alkaline Phosphatase: 73 U/L (ref 38–126)
Anion gap: 13 (ref 5–15)
BUN: 15 mg/dL (ref 6–20)
CO2: 18 mmol/L — ABNORMAL LOW (ref 22–32)
Calcium: 9.4 mg/dL (ref 8.9–10.3)
Chloride: 105 mmol/L (ref 98–111)
Creatinine, Ser: 1.31 mg/dL — ABNORMAL HIGH (ref 0.44–1.00)
GFR calc Af Amer: 53 mL/min — ABNORMAL LOW (ref >60)
GFR calc non Af Amer: 45 mL/min — ABNORMAL LOW (ref >60)
Glucose, Bld: 178 mg/dL — ABNORMAL HIGH (ref 70–99)
Potassium: 4.3 mmol/L (ref 3.5–5.1)
Sodium: 136 mmol/L (ref 135–145)
Total Bilirubin: 0.4 mg/dL (ref 0.3–1.2)
Total Protein: 6.8 g/dL (ref 6.5–8.1)

## 2018-12-25 LAB — URINALYSIS, ROUTINE W REFLEX MICROSCOPIC
Bilirubin Urine: NEGATIVE
Glucose, UA: NEGATIVE mg/dL
Hgb urine dipstick: NEGATIVE
Ketones, ur: NEGATIVE mg/dL
Nitrite: NEGATIVE
Protein, ur: 100 mg/dL — AB
Specific Gravity, Urine: 1.017 (ref 1.005–1.030)
WBC, UA: >50 WBC/hpf — ABNORMAL HIGH (ref 0–5)
pH: 5 (ref 5.0–8.0)

## 2018-12-25 LAB — CBC
HCT: 32 % — ABNORMAL LOW (ref 36.0–46.0)
Hemoglobin: 10.1 g/dL — ABNORMAL LOW (ref 12.0–15.0)
MCH: 30.2 pg (ref 26.0–34.0)
MCHC: 31.6 g/dL (ref 30.0–36.0)
MCV: 95.8 fL (ref 80.0–100.0)
Platelets: 421 10*3/uL — ABNORMAL HIGH (ref 150–400)
RBC: 3.34 MIL/uL — ABNORMAL LOW (ref 3.87–5.11)
RDW: 15.4 % (ref 11.5–15.5)
WBC: 8.5 10*3/uL (ref 4.0–10.5)
nRBC: 0 % (ref 0.0–0.2)

## 2018-12-25 LAB — LIPASE, BLOOD: Lipase: 28 U/L (ref 11–51)

## 2018-12-25 MED ORDER — HYDROMORPHONE HCL 1 MG/ML IJ SOLN
0.5000 mg | Freq: Once | INTRAMUSCULAR | Status: AC
  Administered 2018-12-26: 0.5 mg via INTRAVENOUS
  Filled 2018-12-25: qty 1

## 2018-12-25 MED ORDER — SODIUM CHLORIDE 0.9% FLUSH: 3.0000 mL | Freq: Once | INTRAVENOUS | Status: DC

## 2018-12-25 MED ORDER — SODIUM CHLORIDE 0.9 % IV BOLUS
1000.0000 mL | Freq: Once | INTRAVENOUS | Status: AC
  Administered 2018-12-26: 1000 mL via INTRAVENOUS

## 2018-12-25 MED ORDER — ONDANSETRON 4 MG PO TBDP
4.0000 mg | ORAL_TABLET | Freq: Once | ORAL | Status: AC | PRN
  Administered 2018-12-25: 4 mg via ORAL
  Filled 2018-12-25: qty 1

## 2018-12-25 MED ORDER — ONDANSETRON HCL 4 MG/2ML IJ SOLN
4.0000 mg | Freq: Once | INTRAMUSCULAR | Status: AC
  Administered 2018-12-26: 4 mg via INTRAVENOUS
  Filled 2018-12-25: qty 2

## 2018-12-25 NOTE — ED Triage Notes (Signed)
Pt reprots recent treatment for UTI, still has right side abd pain with nausea, no fever.

## 2018-12-25 NOTE — ED Notes (Signed)
Numerus utis and kidney stone problem in the past 6 weeks  She sees a urologist in [place] she stopped taking her last antibiotic  Because it made her feel weird  No temp today

## 2018-12-25 NOTE — ED Notes (Signed)
Pt on her cell phone I will retuirn whenever she  Gets off her phone

## 2018-12-25 NOTE — ED Provider Notes (Signed)
[facility] Provider Note   CSN: [id number] Arrival date & time: 12/25/18  1600     History   Chief Complaint Chief Complaint  Patient presents with  . Urinary Tract Infection  . Nausea    HPI Kelli Cruz is a 58 y.o. female.     Patient to ED with symptoms of right side abdomen and flank pain with fever for the past one week. History of recurrent UTI's for the past 9 weeks. She is followed by urology secondary to having a bladder stimulator for incontinence (2014) and is treated by urology for the UTI's. She started having symptoms last week and was started on Keflex on Tuesday (10 days ago). Thursday she developed fever, vomiting and worsening pain that has not significantly improved with the exception of resolution of her fever. She reports being unable to take anything by mouth without vomiting.  The history is provided by the patient. No language interpreter was used.  Urinary Tract Infection Associated symptoms: abdominal pain, fever (See HPI. None today.), flank pain, nausea and vomiting     Past Medical History:  Diagnosis Date  . Anemia   . Anxiety   . Arthritis   . Bipolar 1 disorder ([place])   . Cancer [facility])    sigmoid colon cancer  . Depression   . Diabetes mellitus    type II  . Endometriosis   . Family history of malignant neoplasm of gastrointestinal tract   . Ganglion cyst   . GERD (gastroesophageal reflux disease)   . Hyperlipidemia   . Hypothyroidism   . Insomnia due to mental condition     Patient Active Problem List   Diagnosis Date Noted  . Anemia 02/19/2017  . Vitamin D deficiency 02/19/2017  . Knee pain, right 11/10/2016  . Proteinuria 02/18/2016  . Exposure to communicable disease 08/04/2015  . Obesity 02/04/2015  . Hypersomnia with sleep apnea 01/06/2015  . Insomnia due to mental condition   . Tingling 08/12/2014  . Burning sensation of mouth 08/12/2014  . Numbness 10/24/2011  . Routine  general medical examination at a health care facility 10/24/2011  . TMJ arthralgia 08/14/2011  . Poor posture 07/26/2011  . B12 deficiency 04/13/2010  . ADENOCARCINOMA, SIGMOID COLON 08/11/2008  . Hyperlipidemia 07/08/2008  . Hypothyroidism 09/12/2006  . Diabetes type 2, controlled ([place]) 09/12/2006  . Depression with anxiety 09/12/2006  . ALLERGIC RHINITIS, SEASONAL 09/12/2006  . FIBROCYSTIC BREAST DISEASE 09/12/2006  . MIGRAINES, HX OF 09/12/2006    Past Surgical History:  Procedure Laterality Date  . ABDOMINAL HYSTERECTOMY    . ABDOMINAL HYSTERECTOMY    . COLON RESECTION  June 2010  . COLON SURGERY    . COLONOSCOPY    . DILATION AND CURETTAGE OF UTERUS  2006   miscarriage   . GANGLION CYST EXCISION    . HERNIA REPAIR    . INTERSTIM IMPLANT PLACEMENT Left 12/11/2012   stimulator is on the left but the electrodes go to the right  . KNEE DISLOCATION SURGERY    . LAPAROSCOPY  1997   D & C for endometriosis     OB History   No obstetric history on file.      Home Medications    Prior to Admission medications   Medication Sig Start Date End Date Taking? Authorizing Provider  acetaminophen (TYLENOL) 325 MG tablet Take 650 mg by mouth every 6 (six) hours as needed. For pain    [provider]  ALPRAZolam (  XANAX) 0.5 MG tablet Take 0.5 mg by mouth at bedtime as needed. For anxiety    [provider]  amphetamine-dextroamphetamine (ADDERALL XR) 30 MG 24 hr capsule Take 30 mg by mouth every morning.    [provider]  amphetamine-dextroamphetamine (ADDERALL) 15 MG tablet Take 1 tablet by mouth daily. 07/19/17   [provider]  atorvastatin (LIPITOR) 10 MG tablet Take 1 tablet (10 mg total) by mouth daily. 09/18/18   Tower, [name], MD  Blood Glucose Monitoring Suppl ([place]) w/Device KIT Check sugar twice daily and as needed. DX 11.9 10/04/18   Tower, [name], MD  Cholecalciferol (VITAMIN D3) 1.25 MG (50000 UT) TABS Take by  mouth.    [provider]  citalopram (CELEXA) 40 MG tablet Take 40 mg by mouth daily.  09/18/13   [provider]  cyanocobalamin (,VITAMIN B-12,) 1000 MCG/ML injection Inject 1 mL (1,000 mcg total) into the muscle every 3 (three) months. every 8 weeks 03/08/18   Tower, [name], MD  Diabetic Sterile Lancets MISC by Does not apply route. Check glucose two times a day and as needed for out of control DM 250.00     [provider]  doxepin (SINEQUAN) 50 MG capsule Take 1 capsule by mouth at bedtime. 02/05/17   [provider]  gabapentin (NEURONTIN) 300 MG capsule Take 1 capsule (300 mg total) by mouth 2 (two) times daily. 09/18/18   Tower, [name], MD  glipiZIDE (GLUCOTROL XL) 5 MG 24 hr tablet Take 1 tablet (5 mg total) by mouth daily. 09/18/18   Tower, Marne A, MD  glucose blood (ONETOUCH VERIO) test strip Strips for one touch verio flex meter. Check sugar twice daily DX E11.9 10/04/18   Tower, [place] A, MD  glucose blood test strip 1 each by Other route. Check glucose two times a day and as needed for out of control DM 250.00     [provider]  ibuprofen (ADVIL,MOTRIN) 200 MG tablet Take 400 mg by mouth as directed. For pain    [provider]  levothyroxine (SYNTHROID) 112 MCG tablet Take 1 tablet (112 mcg total) by mouth daily. 09/18/18   Tower, [name], MD  LIFESCAN FINEPOINT LANCETS MISC Lancets and device for one touch verio flex meter. Use daily. DX 11.9 10/04/18   Tower, Marne A, MD  lisinopril (ZESTRIL) 5 MG tablet Take 0.5 tablets (2.5 mg total) by mouth daily. 09/18/18   Tower, [name], MD  metFORMIN (GLUCOPHAGE) 1000 MG tablet Take 1 tablet (1,000 mg total) by mouth 2 (two) times daily with a meal. 09/18/18   Tower, [name], MD  OVER THE COUNTER MEDICATION B 12 one tablet daily.    [provider]  VRAYLAR 3 MG CAPS Take 1 capsule by mouth daily. 02/10/16   [provider]  zolpidem (AMBIEN) 5 MG tablet Take 5 mg by mouth at  bedtime as needed. For sleep    [provider]  cholestyramine Lucrezia Starch) 4 G packet Mix with juice or water and drink every 1-3 days. 11/18/10 05/01/11  Inda Castle, MD    Family History Family History  Problem Relation Age of Onset  . Breast cancer Mother   . Diabetes Mother   . Coronary artery disease Mother   . Kidney disease Mother        renal insufficiency  . Hyperlipidemia Mother   . Diabetes Father   . Coronary artery disease Father   .  Colon cancer Father   . Colon cancer Paternal Grandmother   . Breast cancer Maternal Aunt   . Breast cancer Paternal Aunt   . Esophageal cancer Neg Hx   . Rectal cancer Neg Hx   . Stomach cancer Neg Hx     Social History Social History   Tobacco Use  . Smoking status: Passive Smoke Exposure - Never Smoker  . Smokeless tobacco: Never Used  . Tobacco comment: husband quit smoking  Substance Use Topics  . Alcohol use: Yes    Alcohol/week: 0.0 standard drinks    Comment: socially twice a month at most or less  . Drug use: No     Allergies   Hydrocodone, Milk-related compounds, and Mirabegron   Review of Systems Review of Systems  Constitutional: Positive for fever (See HPI. None today.). Negative for chills.  HENT: Negative.   Respiratory: Negative.   Cardiovascular: Negative.   Gastrointestinal: Positive for abdominal pain, nausea and vomiting.  Genitourinary: Positive for flank pain.  Skin: Negative.   Neurological: Positive for weakness.     Physical Exam Updated Vital Signs BP 126/67   Pulse 86   Temp 98.4 F (36.9 C) (Oral)   Resp 18   LMP 07/15/2006   SpO2 100%   Physical Exam Vitals signs and nursing note reviewed.  Constitutional:      Appearance: She is well-developed.  HENT:     Head: Normocephalic.  Neck:     Musculoskeletal: Normal range of motion and neck supple.  Cardiovascular:     Rate and Rhythm: Normal rate and regular rhythm.  Pulmonary:     Effort: Pulmonary effort is  normal.     Breath sounds: Normal breath sounds. No wheezing, rhonchi or rales.  Abdominal:     General: Bowel sounds are normal.     Palpations: Abdomen is soft.     Tenderness: There is abdominal tenderness (Right upper and lower abdomen.). There is no guarding or rebound.  Genitourinary:    Comments: Right CVA tenderness.  Musculoskeletal: Normal range of motion.  Skin:    General: Skin is warm and dry.     Findings: No rash.  Neurological:     Mental Status: She is alert and oriented to person, place, and time.     Cranial Nerves: No cranial nerve deficit.      ED Treatments / Results  Labs (all labs ordered are listed, but only abnormal results are displayed) Labs Reviewed  COMPREHENSIVE METABOLIC PANEL - Abnormal; Notable for the following components:      Result Value   CO2 18 (*)    Glucose, Bld 178 (*)    Creatinine, Ser 1.31 (*)    Albumin 2.8 (*)    GFR calc non Af Amer 45 (*)    GFR calc Af Amer 53 (*)    All other components within normal limits  CBC - Abnormal; Notable for the following components:   RBC 3.34 (*)    Hemoglobin 10.1 (*)    HCT 32.0 (*)    Platelets 421 (*)    All other components within normal limits  URINALYSIS, ROUTINE W REFLEX MICROSCOPIC - Abnormal; Notable for the following components:   APPearance CLOUDY (*)    Protein, ur 100 (*)    Leukocytes,Ua LARGE (*)    WBC, UA >50 (*)    Bacteria, UA RARE (*)    All other components within normal limits  LIPASE, BLOOD   Results for orders placed or performed  during the hospital encounter of 12/25/18  Lipase, blood  Result Value Ref Range   Lipase 28 11 - 51 U/L  Comprehensive metabolic panel  Result Value Ref Range   Sodium 136 135 - 145 mmol/L   Potassium 4.3 3.5 - 5.1 mmol/L   Chloride 105 98 - 111 mmol/L   CO2 18 (L) 22 - 32 mmol/L   Glucose, Bld 178 (H) 70 - 99 mg/dL   BUN 15 6 - 20 mg/dL   Creatinine, Ser 1.31 (H) 0.44 - 1.00 mg/dL   Calcium 9.4 8.9 - 10.3 mg/dL   Total  Protein 6.8 6.5 - 8.1 g/dL   Albumin 2.8 (L) 3.5 - 5.0 g/dL   AST 16 15 - 41 U/L   ALT 16 0 - 44 U/L   Alkaline Phosphatase 73 38 - 126 U/L   Total Bilirubin 0.4 0.3 - 1.2 mg/dL   GFR calc non Af Amer 45 (L) >60 mL/min   GFR calc Af Amer 53 (L) >60 mL/min   Anion gap 13 5 - 15  CBC  Result Value Ref Range   WBC 8.5 4.0 - 10.5 K/uL   RBC 3.34 (L) 3.87 - 5.11 MIL/uL   Hemoglobin 10.1 (L) 12.0 - 15.0 g/dL   HCT 32.0 (L) 36.0 - 46.0 %   MCV 95.8 80.0 - 100.0 fL   MCH 30.2 26.0 - 34.0 pg   MCHC 31.6 30.0 - 36.0 g/dL   RDW 15.4 11.5 - 15.5 %   Platelets 421 (H) 150 - 400 K/uL   nRBC 0.0 0.0 - 0.2 %  Urinalysis, Routine w reflex microscopic  Result Value Ref Range   Color, Urine YELLOW YELLOW   APPearance CLOUDY (A) CLEAR   Specific Gravity, Urine 1.017 1.005 - 1.030   pH 5.0 5.0 - 8.0   Glucose, UA NEGATIVE NEGATIVE mg/dL   Hgb urine dipstick NEGATIVE NEGATIVE   Bilirubin Urine NEGATIVE NEGATIVE   Ketones, ur NEGATIVE NEGATIVE mg/dL   Protein, ur 100 (A) NEGATIVE mg/dL   Nitrite NEGATIVE NEGATIVE   Leukocytes,Ua LARGE (A) NEGATIVE   RBC / HPF 0-5 0 - 5 RBC/hpf   WBC, UA >50 (H) 0 - 5 WBC/hpf   Bacteria, UA RARE (A) NONE SEEN   Squamous Epithelial / LPF 0-5 0 - 5   Mucus PRESENT    Budding Yeast PRESENT     EKG None  Radiology No results found.  Procedures Procedures (including critical care time)  Medications Ordered in ED Medications  sodium chloride flush (NS) 0.9 % injection 3 mL (has no administration in time range)  sodium chloride 0.9 % bolus 1,000 mL (has no administration in time range)  HYDROmorphone (DILAUDID) injection 0.5 mg (has no administration in time range)  ondansetron (ZOFRAN) injection 4 mg (has no administration in time range)  ondansetron (ZOFRAN-ODT) disintegrating tablet 4 mg (4 mg Oral Given 12/25/18 1720)     Initial Impression / Assessment and Plan / ED Course  I have reviewed the triage vital signs and the nursing notes.  Pertinent  labs & imaging results that were available during my care of the patient were reviewed by me and considered in my medical decision making (see chart for details).        Patient to ED with right abdominal/right flank pain, nausea, vomiting, fever x one week. On Keflex for recurrent UTI by her urologist, getting worse.   Patient's evaluation shows evidence of right pyelonephritis. There has been no vomiting here. VSS.  She feels she does not tolerate Keflex well due to side effects of worse nausea/vomiting.   PO challenge given prior to discharge, however, patient vomited shortly afterward. Additional zofran provided. Nausea continues without vomiting.   Phenergan given. She is tolerating PO fluids. She states she is ready to try to discharge home. Discussed return precautions.   Cefdinir Rx provided, Zofran, Percocet. Encouraged urology recheck next week. CT scan placed on disc and sent with the patient for aid in follow up.  Final Clinical Impressions(s) / ED Diagnoses   Final diagnoses:  None   1. Right pyelonephritis 2. Nausea and vomiting.  ED Discharge Orders    None       Charlann Lange, PA-C 12/26/18 3212    Ward, Delice Bison, DO 12/26/18 [phone]

## 2018-12-26 ENCOUNTER — Emergency Department (HOSPITAL_COMMUNITY): Payer: BC Managed Care – PPO

## 2018-12-26 DIAGNOSIS — R109 Unspecified abdominal pain: Secondary | ICD-10-CM | POA: Diagnosis not present

## 2018-12-26 MED ORDER — OXYCODONE-ACETAMINOPHEN 5-325 MG PO TABS: 1.0000 | ORAL_TABLET | Freq: Four times a day (QID) | ORAL | 0 refills | Status: DC | PRN

## 2018-12-26 MED ORDER — ONDANSETRON 4 MG PO TBDP: 4.0000 mg | ORAL_TABLET | Freq: Three times a day (TID) | ORAL | 0 refills | Status: DC | PRN

## 2018-12-26 MED ORDER — IOHEXOL 300 MG/ML  SOLN
100.0000 mL | Freq: Once | INTRAMUSCULAR | Status: AC | PRN
  Administered 2018-12-26: 100 mL via INTRAVENOUS

## 2018-12-26 MED ORDER — CEFDINIR 300 MG PO CAPS: 300.0000 mg | ORAL_CAPSULE | Freq: Two times a day (BID) | ORAL | 0 refills | Status: DC

## 2018-12-26 MED ORDER — CEFDINIR 300 MG PO CAPS
300.0000 mg | ORAL_CAPSULE | Freq: Once | ORAL | Status: AC
  Administered 2018-12-26: 300 mg via ORAL
  Filled 2018-12-26: qty 1

## 2018-12-26 MED ORDER — PROMETHAZINE HCL 25 MG/ML IJ SOLN
6.2500 mg | Freq: Once | INTRAMUSCULAR | Status: AC
  Administered 2018-12-26: 6.25 mg via INTRAVENOUS
  Filled 2018-12-26: qty 1

## 2018-12-26 MED ORDER — ONDANSETRON HCL 4 MG/2ML IJ SOLN
4.0000 mg | Freq: Once | INTRAMUSCULAR | Status: AC
  Administered 2018-12-26: 4 mg via INTRAVENOUS
  Filled 2018-12-26: qty 2

## 2018-12-26 NOTE — ED Notes (Signed)
Pt still nauseated unable to keep antibiotic down

## 2018-12-26 NOTE — ED Notes (Signed)
Pt given med for nausea

## 2018-12-26 NOTE — Discharge Instructions (Addendum)
Follow up with your urologist for recheck early next week. Stop Keflex and start cefdinir for infection. Take Zofran for nausea, tylenol or ibuprofen for pain and Percocet for severe pain.   Return to the emergency department with any worsening symptoms or new concerns.

## 2018-12-26 NOTE — ED Notes (Signed)
The pts pain is better

## 2018-12-26 NOTE — ED Notes (Signed)
Patient ambulated to bathroom without assistance. 

## 2018-12-26 NOTE — ED Notes (Signed)
Pt vomited 300 ml of liquid

## 2018-12-29 ENCOUNTER — Emergency Department (HOSPITAL_COMMUNITY): Payer: BC Managed Care – PPO

## 2018-12-29 ENCOUNTER — Other Ambulatory Visit: Payer: Self-pay

## 2018-12-29 ENCOUNTER — Inpatient Hospital Stay (HOSPITAL_COMMUNITY)
Admission: EM | Admit: 2018-12-29 | Discharge: 2019-01-03 | DRG: 378 | Disposition: A | Discharge status: Home / Self Care | Payer: BC Managed Care – PPO | Attending: Family Medicine | Admitting: Family Medicine

## 2018-12-29 ENCOUNTER — Encounter (HOSPITAL_COMMUNITY): Payer: Self-pay | Admitting: Internal Medicine

## 2018-12-29 DIAGNOSIS — Z8349 Family history of other endocrine, nutritional and metabolic diseases: Secondary | ICD-10-CM

## 2018-12-29 DIAGNOSIS — F419 Anxiety disorder, unspecified: Secondary | ICD-10-CM | POA: Diagnosis present

## 2018-12-29 DIAGNOSIS — K922 Gastrointestinal hemorrhage, unspecified: Secondary | ICD-10-CM

## 2018-12-29 DIAGNOSIS — Z7984 Long term (current) use of oral hypoglycemic drugs: Secondary | ICD-10-CM

## 2018-12-29 DIAGNOSIS — R933 Abnormal findings on diagnostic imaging of other parts of digestive tract: Secondary | ICD-10-CM | POA: Diagnosis not present

## 2018-12-29 DIAGNOSIS — Z8249 Family history of ischemic heart disease and other diseases of the circulatory system: Secondary | ICD-10-CM

## 2018-12-29 DIAGNOSIS — Z20828 Contact with and (suspected) exposure to other viral communicable diseases: Secondary | ICD-10-CM | POA: Diagnosis not present

## 2018-12-29 DIAGNOSIS — K219 Gastro-esophageal reflux disease without esophagitis: Secondary | ICD-10-CM | POA: Diagnosis present

## 2018-12-29 DIAGNOSIS — C189 Malignant neoplasm of colon, unspecified: Secondary | ICD-10-CM | POA: Diagnosis present

## 2018-12-29 DIAGNOSIS — D649 Anemia, unspecified: Secondary | ICD-10-CM

## 2018-12-29 DIAGNOSIS — E119 Type 2 diabetes mellitus without complications: Secondary | ICD-10-CM | POA: Diagnosis not present

## 2018-12-29 DIAGNOSIS — E039 Hypothyroidism, unspecified: Secondary | ICD-10-CM | POA: Diagnosis present

## 2018-12-29 DIAGNOSIS — K269 Duodenal ulcer, unspecified as acute or chronic, without hemorrhage or perforation: Secondary | ICD-10-CM | POA: Diagnosis not present

## 2018-12-29 DIAGNOSIS — Z85038 Personal history of other malignant neoplasm of large intestine: Secondary | ICD-10-CM | POA: Diagnosis present

## 2018-12-29 DIAGNOSIS — E118 Type 2 diabetes mellitus with unspecified complications: Secondary | ICD-10-CM

## 2018-12-29 DIAGNOSIS — Z803 Family history of malignant neoplasm of breast: Secondary | ICD-10-CM

## 2018-12-29 DIAGNOSIS — F900 Attention-deficit hyperactivity disorder, predominantly inattentive type: Secondary | ICD-10-CM | POA: Diagnosis not present

## 2018-12-29 DIAGNOSIS — M6740 Ganglion, unspecified site: Secondary | ICD-10-CM | POA: Diagnosis present

## 2018-12-29 DIAGNOSIS — F909 Attention-deficit hyperactivity disorder, unspecified type: Secondary | ICD-10-CM | POA: Diagnosis present

## 2018-12-29 DIAGNOSIS — E785 Hyperlipidemia, unspecified: Secondary | ICD-10-CM | POA: Diagnosis not present

## 2018-12-29 DIAGNOSIS — E538 Deficiency of other specified B group vitamins: Secondary | ICD-10-CM | POA: Diagnosis not present

## 2018-12-29 DIAGNOSIS — M47816 Spondylosis without myelopathy or radiculopathy, lumbar region: Secondary | ICD-10-CM | POA: Diagnosis present

## 2018-12-29 DIAGNOSIS — E1169 Type 2 diabetes mellitus with other specified complication: Secondary | ICD-10-CM

## 2018-12-29 DIAGNOSIS — F3341 Major depressive disorder, recurrent, in partial remission: Secondary | ICD-10-CM | POA: Diagnosis not present

## 2018-12-29 DIAGNOSIS — Z791 Long term (current) use of non-steroidal anti-inflammatories (NSAID): Secondary | ICD-10-CM

## 2018-12-29 DIAGNOSIS — I10 Essential (primary) hypertension: Secondary | ICD-10-CM | POA: Diagnosis present

## 2018-12-29 DIAGNOSIS — K2980 Duodenitis without bleeding: Secondary | ICD-10-CM | POA: Diagnosis not present

## 2018-12-29 DIAGNOSIS — K2960 Other gastritis without bleeding: Secondary | ICD-10-CM | POA: Diagnosis not present

## 2018-12-29 DIAGNOSIS — N10 Acute tubulo-interstitial nephritis: Secondary | ICD-10-CM | POA: Diagnosis present

## 2018-12-29 DIAGNOSIS — R1011 Right upper quadrant pain: Secondary | ICD-10-CM | POA: Diagnosis not present

## 2018-12-29 DIAGNOSIS — K264 Chronic or unspecified duodenal ulcer with hemorrhage: Secondary | ICD-10-CM | POA: Diagnosis not present

## 2018-12-29 DIAGNOSIS — N393 Stress incontinence (female) (male): Secondary | ICD-10-CM | POA: Diagnosis not present

## 2018-12-29 DIAGNOSIS — F319 Bipolar disorder, unspecified: Secondary | ICD-10-CM | POA: Diagnosis present

## 2018-12-29 DIAGNOSIS — D62 Acute posthemorrhagic anemia: Secondary | ICD-10-CM | POA: Diagnosis not present

## 2018-12-29 DIAGNOSIS — N2889 Other specified disorders of kidney and ureter: Secondary | ICD-10-CM | POA: Diagnosis not present

## 2018-12-29 DIAGNOSIS — C187 Malignant neoplasm of sigmoid colon: Secondary | ICD-10-CM | POA: Diagnosis present

## 2018-12-29 DIAGNOSIS — R112 Nausea with vomiting, unspecified: Secondary | ICD-10-CM | POA: Diagnosis present

## 2018-12-29 DIAGNOSIS — N12 Tubulo-interstitial nephritis, not specified as acute or chronic: Secondary | ICD-10-CM | POA: Diagnosis not present

## 2018-12-29 DIAGNOSIS — K319 Disease of stomach and duodenum, unspecified: Secondary | ICD-10-CM | POA: Diagnosis not present

## 2018-12-29 DIAGNOSIS — Z90722 Acquired absence of ovaries, bilateral: Secondary | ICD-10-CM

## 2018-12-29 DIAGNOSIS — K5900 Constipation, unspecified: Secondary | ICD-10-CM | POA: Diagnosis not present

## 2018-12-29 DIAGNOSIS — K3189 Other diseases of stomach and duodenum: Secondary | ICD-10-CM | POA: Diagnosis not present

## 2018-12-29 DIAGNOSIS — K921 Melena: Secondary | ICD-10-CM | POA: Diagnosis not present

## 2018-12-29 DIAGNOSIS — N200 Calculus of kidney: Secondary | ICD-10-CM | POA: Diagnosis present

## 2018-12-29 DIAGNOSIS — Z79899 Other long term (current) drug therapy: Secondary | ICD-10-CM

## 2018-12-29 DIAGNOSIS — Z79890 Hormone replacement therapy (postmenopausal): Secondary | ICD-10-CM

## 2018-12-29 DIAGNOSIS — Z800 Family history of malignant neoplasm of digestive organs: Secondary | ICD-10-CM

## 2018-12-29 DIAGNOSIS — Z841 Family history of disorders of kidney and ureter: Secondary | ICD-10-CM

## 2018-12-29 DIAGNOSIS — Z833 Family history of diabetes mellitus: Secondary | ICD-10-CM | POA: Diagnosis not present

## 2018-12-29 DIAGNOSIS — Z87440 Personal history of urinary (tract) infections: Secondary | ICD-10-CM

## 2018-12-29 DIAGNOSIS — F411 Generalized anxiety disorder: Secondary | ICD-10-CM | POA: Diagnosis not present

## 2018-12-29 DIAGNOSIS — Z90710 Acquired absence of both cervix and uterus: Secondary | ICD-10-CM

## 2018-12-29 DIAGNOSIS — Z85048 Personal history of other malignant neoplasm of rectum, rectosigmoid junction, and anus: Secondary | ICD-10-CM

## 2018-12-29 DIAGNOSIS — R9431 Abnormal electrocardiogram [ECG] [EKG]: Secondary | ICD-10-CM | POA: Diagnosis not present

## 2018-12-29 DIAGNOSIS — Z8719 Personal history of other diseases of the digestive system: Secondary | ICD-10-CM | POA: Diagnosis present

## 2018-12-29 HISTORY — DX: Nausea with vomiting, unspecified: R11.2

## 2018-12-29 LAB — I-STAT CHEM 8, ED
BUN: 38 mg/dL — ABNORMAL HIGH (ref 6–20)
Calcium, Ion: 1.13 mmol/L — ABNORMAL LOW (ref 1.15–1.40)
Chloride: 106 mmol/L (ref 98–111)
Creatinine, Ser: 1.1 mg/dL — ABNORMAL HIGH (ref 0.44–1.00)
Glucose, Bld: 183 mg/dL — ABNORMAL HIGH (ref 70–99)
HCT: 19 % — ABNORMAL LOW (ref 36.0–46.0)
Hemoglobin: 6.5 g/dL — CL (ref 12.0–15.0)
Potassium: 3.9 mmol/L (ref 3.5–5.1)
Sodium: 136 mmol/L (ref 135–145)
TCO2: 20 mmol/L — ABNORMAL LOW (ref 22–32)

## 2018-12-29 LAB — COMPREHENSIVE METABOLIC PANEL
ALT: 12 U/L (ref 0–44)
AST: 14 U/L — ABNORMAL LOW (ref 15–41)
Albumin: 2.6 g/dL — ABNORMAL LOW (ref 3.5–5.0)
Alkaline Phosphatase: 62 U/L (ref 38–126)
Anion gap: 13 (ref 5–15)
BUN: 38 mg/dL — ABNORMAL HIGH (ref 6–20)
CO2: 21 mmol/L — ABNORMAL LOW (ref 22–32)
Calcium: 8.8 mg/dL — ABNORMAL LOW (ref 8.9–10.3)
Chloride: 101 mmol/L (ref 98–111)
Creatinine, Ser: 1.28 mg/dL — ABNORMAL HIGH (ref 0.44–1.00)
GFR calc Af Amer: 54 mL/min — ABNORMAL LOW (ref >60)
GFR calc non Af Amer: 47 mL/min — ABNORMAL LOW (ref >60)
Glucose, Bld: 217 mg/dL — ABNORMAL HIGH (ref 70–99)
Potassium: 3.8 mmol/L (ref 3.5–5.1)
Sodium: 135 mmol/L (ref 135–145)
Total Bilirubin: 0.3 mg/dL (ref 0.3–1.2)
Total Protein: 6.2 g/dL — ABNORMAL LOW (ref 6.5–8.1)

## 2018-12-29 LAB — URINALYSIS, ROUTINE W REFLEX MICROSCOPIC
Bilirubin Urine: NEGATIVE
Glucose, UA: NEGATIVE mg/dL
Hgb urine dipstick: NEGATIVE
Ketones, ur: 5 mg/dL — AB
Nitrite: NEGATIVE
Protein, ur: NEGATIVE mg/dL
Specific Gravity, Urine: 1.017 (ref 1.005–1.030)
pH: 5 (ref 5.0–8.0)

## 2018-12-29 LAB — CBC WITH DIFFERENTIAL/PLATELET
Abs Immature Granulocytes: 0.21 10*3/uL — ABNORMAL HIGH (ref 0.00–0.07)
Basophils Absolute: 0 10*3/uL (ref 0.0–0.1)
Basophils Relative: 0 %
Eosinophils Absolute: 0 10*3/uL (ref 0.0–0.5)
Eosinophils Relative: 0 %
HCT: 21.7 % — ABNORMAL LOW (ref 36.0–46.0)
Hemoglobin: 6.8 g/dL — CL (ref 12.0–15.0)
Immature Granulocytes: 2 %
Lymphocytes Relative: 16 %
Lymphs Abs: 2.1 10*3/uL (ref 0.7–4.0)
MCH: 29.4 pg (ref 26.0–34.0)
MCHC: 31.3 g/dL (ref 30.0–36.0)
MCV: 93.9 fL (ref 80.0–100.0)
Monocytes Absolute: 0.3 10*3/uL (ref 0.1–1.0)
Monocytes Relative: 2 %
Neutro Abs: 10.8 10*3/uL — ABNORMAL HIGH (ref 1.7–7.7)
Neutrophils Relative %: 80 %
Platelets: 498 10*3/uL — ABNORMAL HIGH (ref 150–400)
RBC: 2.31 MIL/uL — ABNORMAL LOW (ref 3.87–5.11)
RDW: 15.3 % (ref 11.5–15.5)
WBC: 13.5 10*3/uL — ABNORMAL HIGH (ref 4.0–10.5)
nRBC: 0 % (ref 0.0–0.2)

## 2018-12-29 LAB — LACTIC ACID, PLASMA
Lactic Acid, Venous: 1.4 mmol/L (ref 0.5–1.9)
Lactic Acid, Venous: 1.2 mmol/L (ref 0.5–1.9)

## 2018-12-29 LAB — PREPARE RBC (CROSSMATCH)

## 2018-12-29 LAB — LIPASE, BLOOD: Lipase: 15 U/L (ref 11–51)

## 2018-12-29 LAB — ABO/RH: ABO/RH(D): O NEG

## 2018-12-29 LAB — POC OCCULT BLOOD, ED: Fecal Occult Bld: NEGATIVE

## 2018-12-29 MED ORDER — MORPHINE SULFATE (PF) 4 MG/ML IV SOLN
4.0000 mg | Freq: Once | INTRAVENOUS | Status: AC
  Administered 2018-12-29: 4 mg via INTRAVENOUS
  Filled 2018-12-29: qty 1

## 2018-12-29 MED ORDER — ONDANSETRON HCL 4 MG/2ML IJ SOLN
4.0000 mg | Freq: Once | INTRAMUSCULAR | Status: AC
  Administered 2018-12-29: 4 mg via INTRAVENOUS
  Filled 2018-12-29: qty 2

## 2018-12-29 MED ORDER — ONDANSETRON HCL 4 MG PO TABS: 4.0000 mg | ORAL_TABLET | Freq: Four times a day (QID) | ORAL | Status: DC | PRN

## 2018-12-29 MED ORDER — PANTOPRAZOLE SODIUM 40 MG IV SOLR
40.0000 mg | Freq: Once | INTRAVENOUS | Status: AC
  Administered 2018-12-29: 40 mg via INTRAVENOUS
  Filled 2018-12-29: qty 40

## 2018-12-29 MED ORDER — ACETAMINOPHEN 325 MG PO TABS: 650.0000 mg | ORAL_TABLET | Freq: Four times a day (QID) | ORAL | Status: DC | PRN

## 2018-12-29 MED ORDER — METOCLOPRAMIDE HCL 5 MG/ML IJ SOLN
5.0000 mg | Freq: Once | INTRAMUSCULAR | Status: AC
  Administered 2018-12-29: 5 mg via INTRAVENOUS
  Filled 2018-12-29: qty 2

## 2018-12-29 MED ORDER — ALPRAZOLAM 0.25 MG PO TABS: 0.5000 mg | ORAL_TABLET | Freq: Every evening | ORAL | Status: DC | PRN

## 2018-12-29 MED ORDER — ONDANSETRON HCL 4 MG/2ML IJ SOLN
4.0000 mg | Freq: Four times a day (QID) | INTRAMUSCULAR | Status: DC | PRN
  Administered 2018-12-30 – 2019-01-02 (×8): 4 mg via INTRAVENOUS
  Filled 2018-12-29 (×8): qty 2

## 2018-12-29 MED ORDER — IOHEXOL 300 MG/ML  SOLN
100.0000 mL | Freq: Once | INTRAMUSCULAR | Status: AC | PRN
  Administered 2018-12-29: 100 mL via INTRAVENOUS

## 2018-12-29 MED ORDER — CARIPRAZINE HCL 3 MG PO CAPS: 3.0000 mg | ORAL_CAPSULE | ORAL | Status: DC

## 2018-12-29 MED ORDER — METRONIDAZOLE IN NACL 5-0.79 MG/ML-% IV SOLN
500.0000 mg | Freq: Once | INTRAVENOUS | Status: AC
  Administered 2018-12-29: 500 mg via INTRAVENOUS
  Filled 2018-12-29: qty 100

## 2018-12-29 MED ORDER — SODIUM CHLORIDE 0.9 % IV BOLUS
1000.0000 mL | Freq: Once | INTRAVENOUS | Status: AC
  Administered 2018-12-29: 1000 mL via INTRAVENOUS

## 2018-12-29 MED ORDER — KETOROLAC TROMETHAMINE 30 MG/ML IJ SOLN
30.0000 mg | Freq: Once | INTRAMUSCULAR | Status: AC
  Administered 2018-12-29: 30 mg via INTRAVENOUS
  Filled 2018-12-29: qty 1

## 2018-12-29 MED ORDER — AMPHETAMINE-DEXTROAMPHET ER 30 MG PO CP24: 30.0000 mg | ORAL_CAPSULE | Freq: Every day | ORAL | Status: DC

## 2018-12-29 MED ORDER — SODIUM CHLORIDE 0.9 % IV SOLN
8.0000 mg/h | INTRAVENOUS | Status: AC
  Administered 2018-12-29 – 2018-12-31 (×3): 8 mg/h via INTRAVENOUS
  Filled 2018-12-29 (×7): qty 80

## 2018-12-29 MED ORDER — INSULIN ASPART 100 UNIT/ML ~~LOC~~ SOLN
0.0000 [IU] | SUBCUTANEOUS | Status: DC
  Administered 2018-12-31: 2 [IU] via SUBCUTANEOUS
  Administered 2018-12-31 – 2019-01-01 (×2): 1 [IU] via SUBCUTANEOUS
  Administered 2019-01-01: 2 [IU] via SUBCUTANEOUS
  Administered 2019-01-02: 5 [IU] via SUBCUTANEOUS
  Administered 2019-01-02: 2 [IU] via SUBCUTANEOUS
  Administered 2019-01-02: 1 [IU] via SUBCUTANEOUS
  Administered 2019-01-02 – 2019-01-03 (×2): 2 [IU] via SUBCUTANEOUS
  Administered 2019-01-03: 5 [IU] via SUBCUTANEOUS
  Administered 2019-01-03: 2 [IU] via SUBCUTANEOUS

## 2018-12-29 MED ORDER — ACETAMINOPHEN 650 MG RE SUPP: 650.0000 mg | Freq: Four times a day (QID) | RECTAL | Status: DC | PRN

## 2018-12-29 MED ORDER — SODIUM CHLORIDE 0.9 % IV SOLN
INTRAVENOUS | Status: DC
  Administered 2018-12-30 (×2): via INTRAVENOUS

## 2018-12-29 MED ORDER — AMPHETAMINE-DEXTROAMPHETAMINE 15 MG PO TABS: 15.0000 mg | ORAL_TABLET | Freq: Every day | ORAL | Status: DC

## 2018-12-29 MED ORDER — SODIUM CHLORIDE 0.9 % IV SOLN
2.0000 g | Freq: Once | INTRAVENOUS | Status: AC
  Administered 2018-12-29: 2 g via INTRAVENOUS
  Filled 2018-12-29: qty 20

## 2018-12-29 MED ORDER — PANTOPRAZOLE SODIUM 40 MG IV SOLR: 40.0000 mg | Freq: Two times a day (BID) | INTRAVENOUS | Status: DC

## 2018-12-29 MED ORDER — LEVOTHYROXINE SODIUM 100 MCG/5ML IV SOLN
66.0000 ug | Freq: Every day | INTRAVENOUS | Status: DC
  Administered 2019-01-01 – 2019-01-02 (×2): 66 ug via INTRAVENOUS
  Filled 2018-12-29 (×3): qty 5

## 2018-12-29 NOTE — ED Notes (Signed)
Patient transported to CT 

## 2018-12-29 NOTE — H&P (Addendum)
History and Physical    Kelli Cruz DOB: 1960/05/15 DOA: 12/29/2018  PCP: Kelli Greenspan, MD  Patient coming from: Home.  Chief Complaint: Right flank pain and nausea vomiting.  HPI: Kelli Cruz is a 58 y.o. female with history of diabetes mellitus type 2, hypothyroidism, colon cancer status post resection in 2010 being followed by Kelli Cruz gastroenterologist had a colonoscopy in the early part of this year presents to the ER for the second time in the last 48 hours because of persistent pain in the right flank with persistent nausea vomiting.  Patient states this is the third time she is having urinary tract infection symptoms over the last 9 weeks and is being followed by urologist.  The symptoms started during this attack around 8 days ago when urologist prescribed Keflex.  Despite taking which patient has been a persistent pain with nausea vomiting had come to the ER 2 days ago was prescribed another antibiotic despite taking which patient's symptoms did not improve.  Has noticed some dark vomitus but has not moved bowels for almost a week now.  Prior to which patient did notice some dark stools.  Takes NSAIDs every day.  ED Course: In the ER CT scan was repeated which shows features concerning for pyelonephritis with ascending infection with no obstruction and also features concerning for duodenal ulcer.  Patient's hemoglobin has dropped by almost 4 g from 2 days ago and stool for occult blood was negative but features are concerning for acute GI bleed and was started on Protonix infusion and Kelli Cruz was consulted for gastroenterologist.  Patient is also placed on ceftriaxone for pyelonephritis.  IV fluids and admitted for further management.  On my exam patient has right flank tenderness.  Review of Systems: As per HPI, rest all negative.   Past Medical History:  Diagnosis Date  . Anemia   . Anxiety   . Arthritis   . Bipolar 1 disorder ([place])   . Cancer [facility])    sigmoid colon cancer  . Depression   . Diabetes mellitus    type II  . Endometriosis   . Family history of malignant neoplasm of gastrointestinal tract   . Ganglion cyst   . GERD (gastroesophageal reflux disease)   . Hyperlipidemia   . Hypothyroidism   . Insomnia due to mental condition     Past Surgical History:  Procedure Laterality Date  . ABDOMINAL HYSTERECTOMY    . ABDOMINAL HYSTERECTOMY    . COLON RESECTION  June 2010  . COLON SURGERY    . COLONOSCOPY    . DILATION AND CURETTAGE OF UTERUS  2006   miscarriage   . GANGLION CYST EXCISION    . HERNIA REPAIR    . INTERSTIM IMPLANT PLACEMENT Left 12/11/2012   stimulator is on the left but the electrodes go to the right  . KNEE DISLOCATION SURGERY    . LAPAROSCOPY  1997   D & C for endometriosis     reports that she is a non-smoker but has been exposed to tobacco smoke. She has never used smokeless tobacco. She reports current alcohol use. She reports that she does not use drugs.  Allergies  Allergen Reactions  . Hydrocodone Nausea And Vomiting    No reaction if taken with enough food  . Keflex [Cephalexin] Nausea And Vomiting and Other (See Comments)    Light-headed/dizziness/weakness  . Mirabegron Other (See Comments)    Trouble breathing and swollen tongue  . Milk-Related  Compounds Nausea And Vomiting    Family History  Problem Relation Age of Onset  . Breast cancer Mother   . Diabetes Mother   . Coronary artery disease Mother   . Kidney disease Mother        renal insufficiency  . Hyperlipidemia Mother   . Diabetes Father   . Coronary artery disease Father   . Colon cancer Father   . Colon cancer Paternal Grandmother   . Breast cancer Maternal Aunt   . Breast cancer Paternal Aunt   . Esophageal cancer Neg Hx   . Rectal cancer Neg Hx   . Stomach cancer Neg Hx     Prior to Admission medications   Medication Sig Start Date End Date Taking? Authorizing Provider  ALPRAZolam Kelli Cruz) 0.5 MG tablet Take 0.5  mg by mouth at bedtime as needed for anxiety (panic attack).    Yes [provider]  amphetamine-dextroamphetamine (ADDERALL XR) 30 MG 24 hr capsule Take 30 mg by mouth daily at 6 (six) AM.    Yes [provider]  amphetamine-dextroamphetamine (ADDERALL) 15 MG tablet Take 15 mg by mouth daily at 2 PM.  07/19/17  Yes [provider]  atorvastatin (LIPITOR) 10 MG tablet Take 1 tablet (10 mg total) by mouth daily. Patient taking differently: Take 10 mg by mouth at bedtime.  09/18/18  Yes Tower, [name], MD  cariprazine (VRAYLAR) capsule Take 3 mg by mouth See admin instructions. Take one capsule (3 mg) by mouth every other night   Yes [provider]  cefdinir (OMNICEF) 300 MG capsule Take 1 capsule (300 mg total) by mouth 2 (two) times daily. 12/26/18  Yes Charlann Lange, PA-C  Cholecalciferol (VITAMIN D3) 125 MCG (5000 UT) CAPS Take 5,000 Units by mouth daily.   Yes [provider]  citalopram (CELEXA) 40 MG tablet Take 40 mg by mouth at bedtime.  09/18/13  Yes [provider]  cyanocobalamin (,VITAMIN B-12,) 1000 MCG/ML injection Inject 1 mL (1,000 mcg total) into the muscle every 3 (three) months. every 8 weeks Patient taking differently: Inject 1,000 mcg into the muscle every 30 (thirty) days.  03/08/18  Yes Tower, [name], MD  Cyanocobalamin (VITAMIN B-12) 5000 MCG TBDP Take 5,000 mcg by mouth daily.   Yes [provider]  doxepin (SINEQUAN) 50 MG capsule Take 50 mg by mouth at bedtime.  02/05/17  Yes [provider]  gabapentin (NEURONTIN) 300 MG capsule Take 1 capsule (300 mg total) by mouth 2 (two) times daily. 09/18/18  Yes Tower, Marne A, MD  glipiZIDE (GLUCOTROL XL) 5 MG 24 hr tablet Take 1 tablet (5 mg total) by mouth daily. 09/18/18  Yes Tower, [name], MD  ibuprofen (ADVIL) 200 MG tablet Take 600 mg by mouth every 6 (six) hours as needed for headache (pain).   Yes [provider]  levothyroxine (SYNTHROID) 112 MCG  tablet Take 1 tablet (112 mcg total) by mouth daily. Patient taking differently: Take 112 mcg by mouth daily before breakfast.  09/18/18  Yes Tower, [name], MD  lisinopril (ZESTRIL) 5 MG tablet Take 0.5 tablets (2.5 mg total) by mouth daily. Patient taking differently: Take 2.5 mg by mouth at bedtime.  09/18/18  Yes Tower, [name], MD  metFORMIN (GLUCOPHAGE) 1000 MG tablet Take 1 tablet (1,000 mg total) by mouth 2 (two) times daily with a meal. 09/18/18  Yes Tower, Marne A, MD  ondansetron (ZOFRAN ODT) 4 MG disintegrating tablet Take 1 tablet (4 mg total)  by mouth every 8 (eight) hours as needed for nausea or vomiting. 12/26/18  Yes Upstill, Nehemiah Settle, PA-C  Blood Glucose Monitoring Suppl ([place]) w/Device KIT Check sugar twice daily and as needed. DX 11.9 10/04/18   Tower, [name], MD  Diabetic Sterile Lancets MISC by Does not apply route. Check glucose two times a day and as needed for out of control DM 250.00     [provider]  glucose blood (ONETOUCH VERIO) test strip Strips for one touch verio flex meter. Check sugar twice daily DX E11.9 10/04/18   Tower, Corning A, MD  glucose blood test strip 1 each by Other route. Check glucose two times a day and as needed for out of control DM 250.00     [provider]  [facility] FINEPOINT LANCETS MISC Lancets and device for one touch verio flex meter. Use daily. DX 11.9 10/04/18   Tower, [name], MD  oxyCODONE-acetaminophen (PERCOCET/ROXICET) 5-325 MG tablet Take 1 tablet by mouth every 6 (six) hours as needed for severe pain. Patient not taking: Reported on 12/29/2018 12/26/18   Charlann Lange, PA-C  cholestyramine Lucrezia Starch) 4 G packet Mix with juice or water and drink every 1-3 days. 11/18/10 05/01/11  Inda Castle, MD    Physical Exam: Constitutional: Moderately built and nourished. Vitals:   12/29/18 2215 12/29/18 2230 12/29/18 2245 12/29/18 2300  BP: 129/60 (!) 126/46 (!) 118/54   Pulse: 80 84 83   Resp:      Temp:     98.5 F (36.9 C)  TempSrc:    Oral  SpO2: 100% 100% 99%   Weight:      Height:       Eyes: Anicteric no pallor. ENMT: No discharge from the ears eyes nose or mouth. Neck: No mass felt.  No neck rigidity. Respiratory: No rhonchi or crepitations. Cardiovascular: S1-S2 heard. Abdomen: Right flank tenderness no guarding or rigidity. Musculoskeletal: No edema. Skin: No rash. Neurologic: Alert awake oriented to time place and person.  Moves all extremities. Psychiatric: Appears normal per normal affect.   Labs on Admission: I have personally reviewed following labs and imaging studies  CBC: Recent Labs  Lab 12/25/18 1719 12/29/18 1717 12/29/18 1809  WBC 8.5 13.5*  --   NEUTROABS  --  10.8*  --   HGB 10.1* 6.8* 6.5*  HCT 32.0* 21.7* 19.0*  MCV 95.8 93.9  --   PLT 421* 498*  --    Basic Metabolic Panel: Recent Labs  Lab 12/25/18 1719 12/29/18 1717 12/29/18 1809  NA 136 135 136  K 4.3 3.8 3.9  CL 105 101 106  CO2 18* 21*  --   GLUCOSE 178* 217* 183*  BUN 15 38* 38*  CREATININE 1.31* 1.28* 1.10*  CALCIUM 9.4 8.8*  --    GFR: Estimated Creatinine Clearance: 58.8 mL/min (A) (by C-G formula based on SCr of 1.1 mg/dL (H)). Liver Function Tests: Recent Labs  Lab 12/25/18 1719 12/29/18 1717  AST 16 14*  ALT 16 12  ALKPHOS 73 62  BILITOT 0.4 0.3  PROT 6.8 6.2*  ALBUMIN 2.8* 2.6*   Recent Labs  Lab 12/25/18 1719 12/29/18 1717  LIPASE 28 15   No results for input(s): AMMONIA in the last 168 hours. Coagulation Profile: No results for input(s): INR, PROTIME in the last 168 hours. Cardiac Enzymes: No results for input(s): CKTOTAL, CKMB, CKMBINDEX, TROPONINI in the last 168 hours. BNP (last 3 results) No results for input(s): PROBNP in the  last 8760 hours. HbA1C: No results for input(s): HGBA1C in the last 72 hours. CBG: No results for input(s): GLUCAP in the last 168 hours. Lipid Profile: No results for input(s): CHOL, HDL, LDLCALC, TRIG, CHOLHDL,  LDLDIRECT in the last 72 hours. Thyroid Function Tests: No results for input(s): TSH, T4TOTAL, FREET4, T3FREE, THYROIDAB in the last 72 hours. Anemia Panel: No results for input(s): VITAMINB12, FOLATE, FERRITIN, TIBC, IRON, RETICCTPCT in the last 72 hours. Urine analysis:    Component Value Date/Time   COLORURINE YELLOW 12/29/2018 [place] 12/29/2018 1717   LABSPEC 1.017 12/29/2018 1717   PHURINE 5.0 12/29/2018 1717   GLUCOSEU NEGATIVE 12/29/2018 1717   HGBUR NEGATIVE 12/29/2018 1717   HGBUR negative 06/19/2008 1232   BILIRUBINUR NEGATIVE 12/29/2018 1717   KETONESUR 5 (A) 12/29/2018 1717   PROTEINUR NEGATIVE 12/29/2018 1717   UROBILINOGEN 0.2 05/01/2011 1032   NITRITE NEGATIVE 12/29/2018 1717   LEUKOCYTESUR TRACE (A) 12/29/2018 1717   Sepsis Labs: _0 (procalcitonin:4,lacticidven:4) )No results found for this or any previous visit (from the past 240 hour(s)).   Radiological Exams on Admission: Ct Abdomen Pelvis W Contrast  Addendum Date: 12/29/2018   ADDENDUM REPORT: 12/29/2018 21:17 ADDENDUM: Study discussed by telephone with PA-C [place] on 12/29/2018 at 2109 hours. Given the setting of new anemia, we discussed that I favor a bleeding duodenal ulcer. Electronically Signed   By: Genevie Ann M.D.   On: 12/29/2018 21:17   Result Date: 12/29/2018 CLINICAL DATA:  58 year old female with right mid abdomen pain radiating to the flank. History of treated colon cancer in 2010. New anemia. EXAM: CT ABDOMEN AND PELVIS WITH CONTRAST TECHNIQUE: Multidetector CT imaging of the abdomen and pelvis was performed using the standard protocol following bolus administration of intravenous contrast. CONTRAST:  166m OMNIPAQUE IOHEXOL 300 MG/ML  SOLN COMPARISON:  CT Abdomen and Pelvis 12/26/2018. FINDINGS: Lower chest: Stable mild lung base scarring. Mildly increased lung volumes and less lung base atelectasis. No pericardial or pleural effusion. Hepatobiliary: Liver enhancement  remains normal. The gallbladder is larger since 12/26/2018, but not definitely inflamed. No bile duct enlargement identified. Pancreas: The pancreas appears stable despite continued inflammation in the 2nd portion of the duodenum. No pancreatic ductal dilatation. Spleen: Stable, negative. Adrenals/Urinary Tract: Normal adrenal glands. Left renal enhancement and contrast excretion remains within normal limits. Decompressed proximal left ureter. Heterogeneous enhancement of the right kidney persists. Multifocal right nephrolithiasis with calculi up to 6 millimeters in the pelvis and lower pole collecting system. Right periureteral stranding without right hydroureter. Diminutive and unremarkable urinary bladder. On the delayed images right renal enhancement remains heterogeneous. Stomach/Bowel: There is a rectal anastomosis with no adverse features on series 3, image 84. Upstream large bowel with mild gas and some retained stool mixed with oral contrast. Greater volume of retained stool from the splenic flexure proximally. Normal appendix containing oral contrast on series 3, image 71. No large bowel inflammation. Negative terminal ileum. No dilated small bowel. No free air. The proximal stomach appears normal. But there is inflammation of the proximal duodenum maximal in the 2nd portion which appears thick walled with surrounding stranding (series 3, image 35). The lumen does appear irregular in this region such as on series 3, image 32. No extraluminal gas or collection identified. The distal duodenum remains normal. Vascular/Lymphatic: Major arterial structures are patent with mild aortic atherosclerosis. Portal venous system is patent. No lymphadenopathy. Reproductive: Surgically absent. Other: No pelvic free fluid.  Stable mild presacral stranding. Musculoskeletal: Stable visualized osseous  structures. There is a right S3 sacral nerve stimulator device with generator along the left flank, unchanged. IMPRESSION: 1.  Continued inflammation of the proximal Duodenum which appears thick-walled and with luminal irregularity suggesting diverticulum or ulcer. No interval perforation is identified.  No abscess. As before, infectious duodenitis or inflamed ulcer disease is favored. EGD likely would be most valuable. 2. Continued abnormal enhancement of the right kidney with right renal and ureter inflammation compatible with Ascending Infection/pyelonephritis. No renal abscess. There is superimposed right nephrolithiasis, but no obstructive uropathy suspected. 3. Stable abdomen and pelvis otherwise. Electronically Signed: By: Genevie Ann M.D. On: 12/29/2018 20:23    EKG: Independently reviewed.  Normal sinus rhythm.  Assessment/Plan Principal Problem:   Acute GI bleeding Active Problems:   ADENOCARCINOMA, SIGMOID COLON   Hypothyroidism   Diabetes type 2, controlled (HCC)   B12 deficiency   Acute pyelonephritis   Nausea & vomiting    1. Acute GI bleed suspect most likely could be upper GI given the CAT scan finding of possible duodenal ulcer and also patient taking NSAIDs.  1 unit of PRBC transfusion has been ordered Protonix infusion started Kelli Cruz of gastroenterology notified.  Recheck CBC after transfusion. 2. Pyelonephritis on the right side with recurrent urinary tract infection on ceftriaxone.  Follow cultures.  Follows with urologist. 3. Acute blood loss anemia with history of B12 deficiency.  Received 1 unit of PRBC follow CBC. 4. Hypothyroidism we will keep patient on IV Synthroid until patient can take orally. 5. Diabetes mellitus type 2 we will keep patient on sliding scale coverage. 6. Intractable nausea vomiting likely from pyelonephritis.  Closely observe. 7. History of depression and ADD presently n.p.o. 8. History of colon cancer being followed by Kelli Cruz gastroenterologist.  COVID-19 test is pending.   DVT prophylaxis: SCDs due to GI bleed. Code Status: Full code. Family Communication:  Discussed with patient. Disposition Plan: Home. Consults called: Kelli Cruz gastroenterologist. Admission status: Observation.   Rise Patience MD [facility] Pager [phone].  If 7PM-7AM, please contact night-coverage [email or web address] Password [facility]  12/29/2018, 11:47 PM

## 2018-12-29 NOTE — ED Provider Notes (Signed)
[facility] EMERGENCY DEPARTMENT Provider Note   CSN: [id number] Arrival date & time: 12/29/18  1558     History   Chief Complaint Chief Complaint  Patient presents with   Abdominal Pain    HPI Kelli Cruz is a 58 y.o. female.     HPI   Patient is a 58 year old female with a history of anemia, anxiety/depression, bipolar disorder, sigmoid colon cancer, diabetes, endometriosis, GERD, hyperlipidemia, hypothyroidism, who presents to the emergency department today for evaluation of flank pain and urinary sxs. Was seen recently and dx with pyelo. Has been on abx for about 5 days and has not felt any improvement. She is still c/o persistent flank pain, abd pain, nausea, vomiting, urinary frequency and stress incontinence. Denies hematuria or dysuria. She states that her fevers have subsided. Has had decreased stool output for the last week. Is still passing flatus. Has chronically loose stool since dx with colon ca, unchanged.   Reviewed records, patient seen in the ED on 12/25/2018 for evaluation of recurrent UTI.  Follows with urology secondary to having bladder stimulator for incontinence.  At that time she was diagnosed with pyelonephritis and was started on cefdinir. Reviewed CT scan, concern for pyelonephritis. Also with possible duodenitis and inflamed duodenal diverticulum.  Past Medical History:  Diagnosis Date   Anemia    Anxiety    Arthritis    Bipolar 1 disorder ([place])    Cancer ([place])    sigmoid colon cancer   Depression    Diabetes mellitus    type II   Endometriosis    Family history of malignant neoplasm of gastrointestinal tract    Ganglion cyst    GERD (gastroesophageal reflux disease)    Hyperlipidemia    Hypothyroidism    Insomnia due to mental condition     Patient Active Problem List   Diagnosis Date Noted   Anemia 02/19/2017   Vitamin D deficiency 02/19/2017   Knee pain, right 11/10/2016   Proteinuria  02/18/2016   Exposure to communicable disease 08/04/2015   Obesity 02/04/2015   Hypersomnia with sleep apnea 01/06/2015   Insomnia due to mental condition    Tingling 08/12/2014   Burning sensation of mouth 08/12/2014   Numbness 10/24/2011   Routine general medical examination at a health care facility 10/24/2011   TMJ arthralgia 08/14/2011   Poor posture 07/26/2011   B12 deficiency 04/13/2010   ADENOCARCINOMA, SIGMOID COLON 08/11/2008   Hyperlipidemia 07/08/2008   Hypothyroidism 09/12/2006   Diabetes type 2, controlled ([place]) 09/12/2006   Depression with anxiety 09/12/2006   ALLERGIC RHINITIS, SEASONAL 09/12/2006   FIBROCYSTIC BREAST DISEASE 09/12/2006   MIGRAINES, HX OF 09/12/2006    Past Surgical History:  Procedure Laterality Date   ABDOMINAL HYSTERECTOMY     ABDOMINAL HYSTERECTOMY     COLON RESECTION  June 2010   COLON SURGERY     COLONOSCOPY     DILATION AND CURETTAGE OF UTERUS  2006   miscarriage    GANGLION CYST EXCISION     HERNIA REPAIR     INTERSTIM IMPLANT PLACEMENT Left 12/11/2012   stimulator is on the left but the electrodes go to the right   [place] for endometriosis     OB History   No obstetric history on file.      Home Medications    Prior to Admission medications   Medication Sig Start Date End Date Taking?  Authorizing Provider  ALPRAZolam Duanne Moron) 0.5 MG tablet Take 0.5 mg by mouth at bedtime as needed for anxiety.     [provider]  amphetamine-dextroamphetamine (ADDERALL XR) 30 MG 24 hr capsule Take 30 mg by mouth daily.     [provider]  amphetamine-dextroamphetamine (ADDERALL) 15 MG tablet Take 15 mg by mouth daily at 2 PM.  07/19/17   [provider]  atorvastatin (LIPITOR) 10 MG tablet Take 1 tablet (10 mg total) by mouth daily. 09/18/18   Tower, [name], MD  Blood Glucose Monitoring Suppl ([place]) w/Device KIT  Check sugar twice daily and as needed. DX 11.9 10/04/18   Tower, [name], MD  cefdinir (OMNICEF) 300 MG capsule Take 1 capsule (300 mg total) by mouth 2 (two) times daily. 12/26/18   Charlann Lange, PA-C  Cholecalciferol (VITAMIN D3) 125 MCG (5000 UT) CAPS Take 5,000 Units by mouth daily.    [provider]  citalopram (CELEXA) 40 MG tablet Take 40 mg by mouth daily.  09/18/13   [provider]  cyanocobalamin (,VITAMIN B-12,) 1000 MCG/ML injection Inject 1 mL (1,000 mcg total) into the muscle every 3 (three) months. every 8 weeks 03/08/18   Tower, [name], MD  Cyanocobalamin (VITAMIN B-12) 5000 MCG TBDP Take 5,000 mcg by mouth daily.    [provider]  Diabetic Sterile Lancets MISC by Does not apply route. Check glucose two times a day and as needed for out of control DM 250.00     [provider]  doxepin (SINEQUAN) 50 MG capsule Take 50 mg by mouth at bedtime.  02/05/17   [provider]  gabapentin (NEURONTIN) 300 MG capsule Take 1 capsule (300 mg total) by mouth 2 (two) times daily. 09/18/18   Tower, [name], MD  glipiZIDE (GLUCOTROL XL) 5 MG 24 hr tablet Take 1 tablet (5 mg total) by mouth daily. 09/18/18   Tower, Marne A, MD  glucose blood (ONETOUCH VERIO) test strip Strips for one touch verio flex meter. Check sugar twice daily DX E11.9 10/04/18   Tower, [place] A, MD  glucose blood test strip 1 each by Other route. Check glucose two times a day and as needed for out of control DM 250.00     [provider]  levothyroxine (SYNTHROID) 112 MCG tablet Take 1 tablet (112 mcg total) by mouth daily. 09/18/18   Tower, [name], MD  LIFESCAN FINEPOINT LANCETS MISC Lancets and device for one touch verio flex meter. Use daily. DX 11.9 10/04/18   Tower, Marne A, MD  lisinopril (ZESTRIL) 5 MG tablet Take 0.5 tablets (2.5 mg total) by mouth daily. 09/18/18   Tower, [name], MD  metFORMIN (GLUCOPHAGE) 1000 MG tablet Take 1 tablet (1,000 mg total) by mouth 2 (two) times  daily with a meal. 09/18/18   Tower, [name], MD  ondansetron (ZOFRAN ODT) 4 MG disintegrating tablet Take 1 tablet (4 mg total) by mouth every 8 (eight) hours as needed for nausea or vomiting. 12/26/18   Charlann Lange, PA-C  oxyCODONE-acetaminophen (PERCOCET/ROXICET) 5-325 MG tablet Take 1 tablet by mouth every 6 (six) hours as needed for severe pain. 12/26/18   Upstill, Shari, PA-C  VRAYLAR 3 MG CAPS Take 3 mg by mouth every other day.  02/10/16   [provider]  zolpidem (AMBIEN) 5 MG tablet Take 5 mg by mouth at bedtime as needed for sleep.     [provider]  cholestyramine Lucrezia Starch) 4 G packet  Mix with juice or water and drink every 1-3 days. 11/18/10 05/01/11  Inda Castle, MD    Family History Family History  Problem Relation Age of Onset   Breast cancer Mother    Diabetes Mother    Coronary artery disease Mother    Kidney disease Mother        renal insufficiency   Hyperlipidemia Mother    Diabetes Father    Coronary artery disease Father    Colon cancer Father    Colon cancer Paternal Grandmother    Breast cancer Maternal Aunt    Breast cancer Paternal Aunt    Esophageal cancer Neg Hx    Rectal cancer Neg Hx    Stomach cancer Neg Hx     Social History Social History   Tobacco Use   Smoking status: Passive Smoke Exposure - Never Smoker   Smokeless tobacco: Never Used   Tobacco comment: husband quit smoking  Substance Use Topics   Alcohol use: Yes    Alcohol/week: 0.0 standard drinks    Comment: socially twice a month at most or less   Drug use: No     Allergies   Hydrocodone, Milk-related compounds, and Mirabegron   Review of Systems Review of Systems  Constitutional: Positive for appetite change and fever (resolved).  HENT: Negative for ear pain and sore throat.   Eyes: Negative for visual disturbance.  Respiratory: Negative for cough and shortness of breath.   Cardiovascular: Negative for chest pain.    Gastrointestinal: Positive for abdominal pain, constipation, diarrhea (chronic), nausea and vomiting.  Genitourinary: Positive for flank pain, frequency and urgency. Negative for dysuria and hematuria.       Stress incontinence  Musculoskeletal: Negative for back pain.  Skin: Negative for color change and rash.  Neurological: Negative for headaches.  All other systems reviewed and are negative.  Physical Exam Updated Vital Signs BP (!) 116/58    Pulse 83    Temp 98.4 F (36.9 C) (Oral)    Resp 17    Ht '5\' 3"'  (1.6 m)    Wt 84.4 kg    LMP 07/15/2006    SpO2 100%    BMI 32.95 kg/m   Physical Exam Vitals signs and nursing note reviewed.  Constitutional:      General: She is not in acute distress.    Appearance: She is well-developed.  HENT:     Head: Normocephalic and atraumatic.     Mouth/Throat:     Mouth: Mucous membranes are dry.  Eyes:     Conjunctiva/sclera: Conjunctivae normal.  Neck:     Musculoskeletal: Neck supple.  Cardiovascular:     Rate and Rhythm: Normal rate and regular rhythm.     Heart sounds: Normal heart sounds. No murmur.  Pulmonary:     Effort: Pulmonary effort is normal. No respiratory distress.     Breath sounds: Normal breath sounds. No wheezing, rhonchi or rales.  Abdominal:     General: Bowel sounds are normal.     Palpations: Abdomen is soft.     Tenderness: There is abdominal tenderness in the right upper quadrant and epigastric area. There is right CVA tenderness and guarding. There is no left CVA tenderness or rebound.  Genitourinary:    Comments: DRE performed with chaperone present. No gross blood or melena on exam. Light brown/yellow stool present. No obvious external hemorrhoids.  Skin:    General: Skin is warm and dry.  Neurological:     Mental Status: She is  alert.     ED Treatments / Results  Labs (all labs ordered are listed, but only abnormal results are displayed) Labs Reviewed  CBC WITH DIFFERENTIAL/PLATELET - Abnormal; Notable  for the following components:      Result Value   WBC 13.5 (*)    RBC 2.31 (*)    Hemoglobin 6.8 (*)    HCT 21.7 (*)    Platelets 498 (*)    Neutro Abs 10.8 (*)    Abs Immature Granulocytes 0.21 (*)    All other components within normal limits  COMPREHENSIVE METABOLIC PANEL - Abnormal; Notable for the following components:   CO2 21 (*)    Glucose, Bld 217 (*)    BUN 38 (*)    Creatinine, Ser 1.28 (*)    Calcium 8.8 (*)    Total Protein 6.2 (*)    Albumin 2.6 (*)    AST 14 (*)    GFR calc non Af Amer 47 (*)    GFR calc Af Amer 54 (*)    All other components within normal limits  URINALYSIS, ROUTINE W REFLEX MICROSCOPIC - Abnormal; Notable for the following components:   Ketones, ur 5 (*)    Leukocytes,Ua TRACE (*)    Bacteria, UA RARE (*)    All other components within normal limits  I-STAT CHEM 8, ED - Abnormal; Notable for the following components:   BUN 38 (*)    Creatinine, Ser 1.10 (*)    Glucose, Bld 183 (*)    Calcium, Ion 1.13 (*)    TCO2 20 (*)    Hemoglobin 6.5 (*)    HCT 19.0 (*)    All other components within normal limits  URINE CULTURE  CULTURE, BLOOD (ROUTINE X 2)  CULTURE, BLOOD (ROUTINE X 2)  SARS CORONAVIRUS 2 (TAT 6-24 HRS)  LIPASE, BLOOD  LACTIC ACID, PLASMA  LACTIC ACID, PLASMA  OCCULT BLOOD X 1 CARD TO LAB, STOOL  POC OCCULT BLOOD, ED  TYPE AND SCREEN  PREPARE RBC (CROSSMATCH)  ABO/RH    EKG None  Radiology Ct Abdomen Pelvis W Contrast  Addendum Date: 12/29/2018   ADDENDUM REPORT: 12/29/2018 21:17 ADDENDUM: Study discussed by telephone with PA-C Devlin Mcveigh on 12/29/2018 at 2109 hours. Given the setting of new anemia, we discussed that I favor a bleeding duodenal ulcer. Electronically Signed   By: Genevie Ann M.D.   On: 12/29/2018 21:17   Result Date: 12/29/2018 CLINICAL DATA:  58 year old female with right mid abdomen pain radiating to the flank. History of treated colon cancer in 2010. New anemia. EXAM: CT ABDOMEN AND PELVIS WITH  CONTRAST TECHNIQUE: Multidetector CT imaging of the abdomen and pelvis was performed using the standard protocol following bolus administration of intravenous contrast. CONTRAST:  118m OMNIPAQUE IOHEXOL 300 MG/ML  SOLN COMPARISON:  CT Abdomen and Pelvis 12/26/2018. FINDINGS: Lower chest: Stable mild lung base scarring. Mildly increased lung volumes and less lung base atelectasis. No pericardial or pleural effusion. Hepatobiliary: Liver enhancement remains normal. The gallbladder is larger since 12/26/2018, but not definitely inflamed. No bile duct enlargement identified. Pancreas: The pancreas appears stable despite continued inflammation in the 2nd portion of the duodenum. No pancreatic ductal dilatation. Spleen: Stable, negative. Adrenals/Urinary Tract: Normal adrenal glands. Left renal enhancement and contrast excretion remains within normal limits. Decompressed proximal left ureter. Heterogeneous enhancement of the right kidney persists. Multifocal right nephrolithiasis with calculi up to 6 millimeters in the pelvis and lower pole collecting system. Right periureteral stranding without right hydroureter. Diminutive  and unremarkable urinary bladder. On the delayed images right renal enhancement remains heterogeneous. Stomach/Bowel: There is a rectal anastomosis with no adverse features on series 3, image 84. Upstream large bowel with mild gas and some retained stool mixed with oral contrast. Greater volume of retained stool from the splenic flexure proximally. Normal appendix containing oral contrast on series 3, image 71. No large bowel inflammation. Negative terminal ileum. No dilated small bowel. No free air. The proximal stomach appears normal. But there is inflammation of the proximal duodenum maximal in the 2nd portion which appears thick walled with surrounding stranding (series 3, image 35). The lumen does appear irregular in this region such as on series 3, image 32. No extraluminal gas or collection  identified. The distal duodenum remains normal. Vascular/Lymphatic: Major arterial structures are patent with mild aortic atherosclerosis. Portal venous system is patent. No lymphadenopathy. Reproductive: Surgically absent. Other: No pelvic free fluid.  Stable mild presacral stranding. Musculoskeletal: Stable visualized osseous structures. There is a right S3 sacral nerve stimulator device with generator along the left flank, unchanged. IMPRESSION: 1. Continued inflammation of the proximal Duodenum which appears thick-walled and with luminal irregularity suggesting diverticulum or ulcer. No interval perforation is identified.  No abscess. As before, infectious duodenitis or inflamed ulcer disease is favored. EGD likely would be most valuable. 2. Continued abnormal enhancement of the right kidney with right renal and ureter inflammation compatible with Ascending Infection/pyelonephritis. No renal abscess. There is superimposed right nephrolithiasis, but no obstructive uropathy suspected. 3. Stable abdomen and pelvis otherwise. Electronically Signed: By: Genevie Ann M.D. On: 12/29/2018 20:23    Procedures Procedures (including critical care time) CRITICAL CARE Performed by: Rodney Booze   Total critical care time: 33 minutes  Critical care time was exclusive of separately billable procedures and treating other patients.  Critical care was necessary to treat or prevent imminent or life-threatening deterioration.  Critical care was time spent personally by me on the following activities: development of treatment plan with patient and/or surrogate as well as nursing, discussions with consultants, evaluation of patient's response to treatment, examination of patient, obtaining history from patient or surrogate, ordering and performing treatments and interventions, ordering and review of laboratory studies, ordering and review of radiographic studies, pulse oximetry and re-evaluation of patient's  condition.   Medications Ordered in ED Medications  morphine 4 MG/ML injection 4 mg (has no administration in time range)  ondansetron (ZOFRAN) injection 4 mg (has no administration in time range)  pantoprazole (PROTONIX) 80 mg in sodium chloride 0.9 % 250 mL (0.32 mg/mL) infusion (has no administration in time range)  pantoprazole (PROTONIX) injection 40 mg (has no administration in time range)  sodium chloride 0.9 % bolus 1,000 mL (0 mLs Intravenous Stopped 12/29/18 1924)  ketorolac (TORADOL) 30 MG/ML injection 30 mg (30 mg Intravenous Given 12/29/18 1723)  metoCLOPramide (REGLAN) injection 5 mg (5 mg Intravenous Given 12/29/18 1723)  cefTRIAXone (ROCEPHIN) 2 g in sodium chloride 0.9 % 100 mL IVPB (0 g Intravenous Stopped 12/29/18 1851)    And  metroNIDAZOLE (FLAGYL) IVPB 500 mg (0 mg Intravenous Stopped 12/29/18 2135)  pantoprazole (PROTONIX) injection 40 mg (40 mg Intravenous Given 12/29/18 1731)  morphine 4 MG/ML injection 4 mg (4 mg Intravenous Given 12/29/18 1933)  ondansetron (ZOFRAN) injection 4 mg (4 mg Intravenous Given 12/29/18 1932)  iohexol (OMNIPAQUE) 300 MG/ML solution 100 mL (100 mLs Intravenous Contrast Given 12/29/18 1942)     Initial Impression / Assessment and Plan / ED Course  I  have reviewed the triage vital signs and the nursing notes.  Pertinent labs & imaging results that were available during my care of the patient were reviewed by me and considered in my medical decision making (see chart for details).  Final Clinical Impressions(s) / ED Diagnoses   Final diagnoses:  Symptomatic anemia  Duodenal ulcer  Pyelonephritis   58 y/o female s/p bladder stimulator with h/o frequent UTIs. Recent dx of pyelonephritis, on abx for several days w/o improvement. Persistent NV.   On arrival, VSS. Pt with epigastric, ruq, right cva ttp.   Reviewed records, patient seen in the ED on 12/25/2018 for evaluation of recurrent UTI.  Follows with urology secondary to having  bladder stimulator for incontinence.  At that time she was diagnosed with pyelonephritis and was started on cefdinir. Reviewed CT scan, concern for pyelonephritis. Also with possible duodenitis and inflamed duodenal diverticulum.  CBC with leukocytosis at 13.5, hgb 6.8 dropped from 10.1 from four days ago.   - Noted dark stool about a week ago. No gross blood on exam  - hemoccult negative  - Istat chem 8 obtained to confirm hgb, still low at 6.5  - Given 1 unit PRBC CMP with elevated BUN/Cr - BUN worse from prior. LFTS ok. No gross electrolyte derangement.  Lipase negative Lactic acid is negative UA with trace leuks, 11-20 wbc, rare bacteria Blood cultures obtained  COVID pending  CT abd/pelvis with continued inflammation of the proximal Duodenum which appears thick-walled and with luminal irregularity suggesting diverticulum or ulcer. No interval perforation is identified.  No abscess. As before, infectious duodenitis or inflamed ulcer disease is favored. EGD likely would be most valuable. Continued abnormal enhancement of the right kidney with right renal and ureter inflammation compatible with Ascending Infection/pyelonephritis. No renal abscess. There is superimposed right nephrolithiasis, but no obstructive uropathy suspected. 3. Stable abdomen and pelvis otherwise.  - discussed with radiology, most likely duodenal ulcer  Pt given IVF, given abx to cover pyelo and possible diverticulitis. Given PPI for duodenitis.   9:45 PM CONSULT with Dr. Benson Norway with GI who will consult on pt.   10:18 PM CONSULT with Dr. Hal Hope who accepts patient for admission   ED Discharge Orders    None       Bishop Dublin 12/29/18 2221    Carmin Muskrat, MD 12/31/18 [phone]

## 2018-12-30 DIAGNOSIS — M47816 Spondylosis without myelopathy or radiculopathy, lumbar region: Secondary | ICD-10-CM | POA: Diagnosis present

## 2018-12-30 DIAGNOSIS — K922 Gastrointestinal hemorrhage, unspecified: Secondary | ICD-10-CM | POA: Diagnosis not present

## 2018-12-30 DIAGNOSIS — K921 Melena: Secondary | ICD-10-CM | POA: Diagnosis not present

## 2018-12-30 DIAGNOSIS — F319 Bipolar disorder, unspecified: Secondary | ICD-10-CM | POA: Diagnosis present

## 2018-12-30 DIAGNOSIS — R933 Abnormal findings on diagnostic imaging of other parts of digestive tract: Secondary | ICD-10-CM

## 2018-12-30 DIAGNOSIS — Z833 Family history of diabetes mellitus: Secondary | ICD-10-CM | POA: Diagnosis not present

## 2018-12-30 DIAGNOSIS — Z8249 Family history of ischemic heart disease and other diseases of the circulatory system: Secondary | ICD-10-CM | POA: Diagnosis not present

## 2018-12-30 DIAGNOSIS — E785 Hyperlipidemia, unspecified: Secondary | ICD-10-CM | POA: Diagnosis present

## 2018-12-30 DIAGNOSIS — K319 Disease of stomach and duodenum, unspecified: Secondary | ICD-10-CM | POA: Diagnosis present

## 2018-12-30 DIAGNOSIS — N12 Tubulo-interstitial nephritis, not specified as acute or chronic: Secondary | ICD-10-CM | POA: Diagnosis present

## 2018-12-30 DIAGNOSIS — E119 Type 2 diabetes mellitus without complications: Secondary | ICD-10-CM | POA: Diagnosis present

## 2018-12-30 DIAGNOSIS — D62 Acute posthemorrhagic anemia: Secondary | ICD-10-CM | POA: Diagnosis present

## 2018-12-30 DIAGNOSIS — K219 Gastro-esophageal reflux disease without esophagitis: Secondary | ICD-10-CM | POA: Diagnosis present

## 2018-12-30 DIAGNOSIS — K2980 Duodenitis without bleeding: Secondary | ICD-10-CM | POA: Diagnosis present

## 2018-12-30 DIAGNOSIS — R1011 Right upper quadrant pain: Secondary | ICD-10-CM

## 2018-12-30 DIAGNOSIS — N10 Acute tubulo-interstitial nephritis: Secondary | ICD-10-CM | POA: Diagnosis present

## 2018-12-30 DIAGNOSIS — F419 Anxiety disorder, unspecified: Secondary | ICD-10-CM | POA: Diagnosis present

## 2018-12-30 DIAGNOSIS — I10 Essential (primary) hypertension: Secondary | ICD-10-CM | POA: Diagnosis present

## 2018-12-30 DIAGNOSIS — C189 Malignant neoplasm of colon, unspecified: Secondary | ICD-10-CM | POA: Diagnosis present

## 2018-12-30 DIAGNOSIS — K264 Chronic or unspecified duodenal ulcer with hemorrhage: Secondary | ICD-10-CM | POA: Diagnosis present

## 2018-12-30 DIAGNOSIS — Z841 Family history of disorders of kidney and ureter: Secondary | ICD-10-CM | POA: Diagnosis not present

## 2018-12-30 DIAGNOSIS — M6740 Ganglion, unspecified site: Secondary | ICD-10-CM | POA: Diagnosis present

## 2018-12-30 DIAGNOSIS — K2960 Other gastritis without bleeding: Secondary | ICD-10-CM | POA: Diagnosis not present

## 2018-12-30 DIAGNOSIS — Z20828 Contact with and (suspected) exposure to other viral communicable diseases: Secondary | ICD-10-CM | POA: Diagnosis present

## 2018-12-30 DIAGNOSIS — K5900 Constipation, unspecified: Secondary | ICD-10-CM | POA: Diagnosis not present

## 2018-12-30 DIAGNOSIS — F909 Attention-deficit hyperactivity disorder, unspecified type: Secondary | ICD-10-CM | POA: Diagnosis present

## 2018-12-30 DIAGNOSIS — E039 Hypothyroidism, unspecified: Secondary | ICD-10-CM | POA: Diagnosis present

## 2018-12-30 DIAGNOSIS — Z803 Family history of malignant neoplasm of breast: Secondary | ICD-10-CM | POA: Diagnosis not present

## 2018-12-30 DIAGNOSIS — N200 Calculus of kidney: Secondary | ICD-10-CM | POA: Diagnosis present

## 2018-12-30 DIAGNOSIS — R112 Nausea with vomiting, unspecified: Secondary | ICD-10-CM | POA: Diagnosis not present

## 2018-12-30 DIAGNOSIS — K269 Duodenal ulcer, unspecified as acute or chronic, without hemorrhage or perforation: Secondary | ICD-10-CM | POA: Diagnosis not present

## 2018-12-30 DIAGNOSIS — E538 Deficiency of other specified B group vitamins: Secondary | ICD-10-CM | POA: Diagnosis present

## 2018-12-30 DIAGNOSIS — N393 Stress incontinence (female) (male): Secondary | ICD-10-CM | POA: Diagnosis present

## 2018-12-30 LAB — GLUCOSE, CAPILLARY
Glucose-Capillary: 120 mg/dL — ABNORMAL HIGH (ref 70–99)
Glucose-Capillary: 113 mg/dL — ABNORMAL HIGH (ref 70–99)
Glucose-Capillary: 107 mg/dL — ABNORMAL HIGH (ref 70–99)

## 2018-12-30 LAB — CBC
HCT: 20 % — ABNORMAL LOW (ref 36.0–46.0)
HCT: 25.4 % — ABNORMAL LOW (ref 36.0–46.0)
Hemoglobin: 6.3 g/dL — CL (ref 12.0–15.0)
Hemoglobin: 8.2 g/dL — ABNORMAL LOW (ref 12.0–15.0)
MCH: 30 pg (ref 26.0–34.0)
MCH: 29.4 pg (ref 26.0–34.0)
MCHC: 31.5 g/dL (ref 30.0–36.0)
MCHC: 32.3 g/dL (ref 30.0–36.0)
MCV: 95.2 fL (ref 80.0–100.0)
MCV: 91 fL (ref 80.0–100.0)
Platelets: 396 10*3/uL (ref 150–400)
Platelets: 419 10*3/uL — ABNORMAL HIGH (ref 150–400)
RBC: 2.1 MIL/uL — ABNORMAL LOW (ref 3.87–5.11)
RBC: 2.79 MIL/uL — ABNORMAL LOW (ref 3.87–5.11)
RDW: 15.4 % (ref 11.5–15.5)
RDW: 16.5 % — ABNORMAL HIGH (ref 11.5–15.5)
WBC: 8.3 10*3/uL (ref 4.0–10.5)
WBC: 10 10*3/uL (ref 4.0–10.5)
nRBC: 0.2 % (ref 0.0–0.2)
nRBC: 0 % (ref 0.0–0.2)

## 2018-12-30 LAB — BASIC METABOLIC PANEL
Anion gap: 7 (ref 5–15)
BUN: 29 mg/dL — ABNORMAL HIGH (ref 6–20)
CO2: 20 mmol/L — ABNORMAL LOW (ref 22–32)
Calcium: 8 mg/dL — ABNORMAL LOW (ref 8.9–10.3)
Chloride: 113 mmol/L — ABNORMAL HIGH (ref 98–111)
Creatinine, Ser: 1.1 mg/dL — ABNORMAL HIGH (ref 0.44–1.00)
GFR calc Af Amer: >60 mL/min (ref >60)
GFR calc non Af Amer: 56 mL/min — ABNORMAL LOW (ref >60)
Glucose, Bld: 114 mg/dL — ABNORMAL HIGH (ref 70–99)
Potassium: 3.6 mmol/L (ref 3.5–5.1)
Sodium: 140 mmol/L (ref 135–145)

## 2018-12-30 LAB — URINE CULTURE: Culture: <10000 — AB

## 2018-12-30 LAB — CBG MONITORING, ED
Glucose-Capillary: 103 mg/dL — ABNORMAL HIGH (ref 70–99)
Glucose-Capillary: 107 mg/dL — ABNORMAL HIGH (ref 70–99)
Glucose-Capillary: 119 mg/dL — ABNORMAL HIGH (ref 70–99)

## 2018-12-30 LAB — SARS CORONAVIRUS 2 (TAT 6-24 HRS): SARS Coronavirus 2: NEGATIVE

## 2018-12-30 LAB — PREPARE RBC (CROSSMATCH)

## 2018-12-30 MED ORDER — FENTANYL CITRATE (PF) 100 MCG/2ML IJ SOLN
25.0000 ug | INTRAMUSCULAR | Status: DC | PRN
  Administered 2018-12-30 (×3): 25 ug via INTRAVENOUS
  Filled 2018-12-30 (×3): qty 2

## 2018-12-30 MED ORDER — SODIUM CHLORIDE 0.9 % IV SOLN
2.0000 g | INTRAVENOUS | Status: DC
  Administered 2018-12-30 – 2019-01-01 (×3): 2 g via INTRAVENOUS
  Filled 2018-12-30: qty 2
  Filled 2018-12-30: qty 20
  Filled 2018-12-30: qty 2
  Filled 2018-12-30 (×2): qty 20

## 2018-12-30 MED ORDER — HYDROMORPHONE HCL 1 MG/ML IJ SOLN
1.0000 mg | INTRAMUSCULAR | Status: DC | PRN
  Administered 2018-12-30 – 2019-01-02 (×9): 1 mg via INTRAVENOUS
  Filled 2018-12-30 (×2): qty 1
  Filled 2018-12-30: qty 2
  Filled 2018-12-30 (×6): qty 1

## 2018-12-30 MED ORDER — LISINOPRIL 10 MG PO TABS
10.0000 mg | ORAL_TABLET | Freq: Every day | ORAL | Status: DC
  Filled 2018-12-30: qty 1

## 2018-12-30 MED ORDER — SODIUM CHLORIDE 0.9% IV SOLUTION
Freq: Once | INTRAVENOUS | Status: AC
  Administered 2018-12-30: 500 mL via INTRAVENOUS

## 2018-12-30 MED ORDER — HYDRALAZINE HCL 20 MG/ML IJ SOLN: 10.0000 mg | Freq: Four times a day (QID) | INTRAMUSCULAR | Status: DC | PRN

## 2018-12-30 MED ORDER — AMLODIPINE BESYLATE 10 MG PO TABS: 10.0000 mg | ORAL_TABLET | Freq: Every day | ORAL | Status: DC

## 2018-12-30 MED ORDER — SODIUM CHLORIDE 0.9 % IV SOLN
INTRAVENOUS | Status: AC
  Administered 2018-12-30: 17:00:00 via INTRAVENOUS

## 2018-12-30 NOTE — Progress Notes (Signed)
PROGRESS NOTE    Kelli Cruz  [id number] DOB: March 29, 1960 DOA: 12/29/2018 PCP: Abner Greenspan, MD  Brief Narrative:  Kelli Cruz is a 58 y.o. female with history of diabetes mellitus type 2, hypothyroidism, colon cancer status post resection in 2010 being followed by Dr. Collene Mares gastroenterologist had a colonoscopy in the early part of this year presented to the ER for the second time in the last 48 hours because of persistent pain in the right flank with persistent nausea vomiting.   -Patient is followed by urologist and reportedly on her third course of antibiotics for UTI in 9 weeks, currently on Keflex which was prescribed 8 days ago . -She has also been taking a lot of ibuprofen this week for severe right flank pain and did notice some dark stools about a week ago -In the ED she was noted to have a hemoglobin of 6.5, abnormal urinalysis she had a CT abdomen pelvis concerning for duodenal ulcer and right pyelonephritis without obstruction  Assessment & Plan:   Upper GI bleed -Likely secondary to peptic ulcer disease, concern for duodenal ulcer on CT abdomen pelvis, with recent excessive NSAID use -2 units of PRBC -IV Protonix infusion -Monitor hemoglobin closely -Gastroenterology consulting, plan for endoscopy tomorrow  Acute blood loss anemia -As above  Right pyelonephritis, recurrent UTI -There is superimposed right nephrolithiasis but no obstructive uropathy on CT -Continue IV ceftriaxone, follow-up urine culture, culture may be negative since this is partially treated but would complete course of antibiotics regardless again -Close follow-up with urology in [place]  Type 2 diabetes mellitus -Hold oral hypoglycemics, sliding scale insulin for now  History of colon cancer -History of resection in 2010, follow-up with gastroenterology Dr. Collene Mares  DVT prophylaxis: SCDs due to GI bleed. Code Status: Full code. Family Communication: Discussed with patient. Disposition  Plan: home in 1 -2days   Consultants:   [facility] covering for Dr.Mann   Procedures:   Antimicrobials:    Subjective: -feels weak and tired, c/o R flank pain  Objective: Vitals:   12/30/18 1038 12/30/18 1130 12/30/18 1201 12/30/18 1259  BP: 115/69 123/61 (!) 126/57 (!) 131/56  Pulse: 77 75 77 77  Resp: 11 10 13 16   Temp: 98.1 F (36.7 C)   97.7 F (36.5 C)  TempSrc: Oral   Oral  SpO2: 100% 100% 99% 100%  Weight:      Height:        Intake/Output Summary (Last 24 hours) at 12/30/2018 1600 Last data filed at 12/30/2018 0904 Gross per 24 hour  Intake 2863 ml  Output -  Net 2863 ml   Filed Weights   12/29/18 1659  Weight: 84.4 kg    Examination:  General exam: AAOx3, ill appearing Respiratory system: decreased BS at bases Cardiovascular system: S1 & S2 heard, RRR.   Gastrointestinal system: Abdomen is nondistended, soft and nontender.Normal bowel sounds heard. R flank tenderness Central nervous system: Alert and oriented. No focal neurological deficits. Extremities: no edema Skin: No rashes, lesions or ulcers Psychiatry:  Mood & affect appropriate.     Data Reviewed:   CBC: Recent Labs  Lab 12/25/18 1719 12/29/18 1717 12/29/18 1809 12/30/18 0500 12/30/18 1314  WBC 8.5 13.5*  --  8.3 10.0  NEUTROABS  --  10.8*  --   --   --   HGB 10.1* 6.8* 6.5* 6.3* 8.2*  HCT 32.0* 21.7* 19.0* 20.0* 25.4*  MCV 95.8 93.9  --  95.2 91.0  PLT 421* 498*  --  396 [id number]*   Basic Metabolic Panel: Recent Labs  Lab 12/25/18 1719 12/29/18 1717 12/29/18 1809 12/30/18 0500  NA 136 135 136 140  K 4.3 3.8 3.9 3.6  CL 105 101 106 113*  CO2 18* 21*  --  20*  GLUCOSE 178* 217* 183* 114*  BUN 15 38* 38* 29*  CREATININE 1.31* 1.28* 1.10* 1.10*  CALCIUM 9.4 8.8*  --  8.0*   GFR: Estimated Creatinine Clearance: 58.8 mL/min (A) (by C-G formula based on SCr of 1.1 mg/dL (H)). Liver Function Tests: Recent Labs  Lab 12/25/18 1719 12/29/18 1717  AST 16 14*  ALT 16 12   ALKPHOS 73 62  BILITOT 0.4 0.3  PROT 6.8 6.2*  ALBUMIN 2.8* 2.6*   Recent Labs  Lab 12/25/18 1719 12/29/18 1717  LIPASE 28 15   No results for input(s): AMMONIA in the last 168 hours. Coagulation Profile: No results for input(s): INR, PROTIME in the last 168 hours. Cardiac Enzymes: No results for input(s): CKTOTAL, CKMB, CKMBINDEX, TROPONINI in the last 168 hours. BNP (last 3 results) No results for input(s): PROBNP in the last 8760 hours. HbA1C: No results for input(s): HGBA1C in the last 72 hours. CBG: Recent Labs  Lab 12/30/18 0008 12/30/18 0539 12/30/18 0755 12/30/18 1234  GLUCAP 119* 107* 103* 120*   Lipid Profile: No results for input(s): CHOL, HDL, LDLCALC, TRIG, CHOLHDL, LDLDIRECT in the last 72 hours. Thyroid Function Tests: No results for input(s): TSH, T4TOTAL, FREET4, T3FREE, THYROIDAB in the last 72 hours. Anemia Panel: No results for input(s): VITAMINB12, FOLATE, FERRITIN, TIBC, IRON, RETICCTPCT in the last 72 hours. Urine analysis:    Component Value Date/Time   COLORURINE YELLOW 12/29/2018 1717   APPEARANCEUR CLEAR 12/29/2018 1717   LABSPEC 1.017 12/29/2018 1717   PHURINE 5.0 12/29/2018 1717   GLUCOSEU NEGATIVE 12/29/2018 1717   HGBUR NEGATIVE 12/29/2018 1717   HGBUR negative 06/19/2008 1232   BILIRUBINUR NEGATIVE 12/29/2018 1717   KETONESUR 5 (A) 12/29/2018 1717   PROTEINUR NEGATIVE 12/29/2018 1717   UROBILINOGEN 0.2 05/01/2011 1032   NITRITE NEGATIVE 12/29/2018 1717   LEUKOCYTESUR TRACE (A) 12/29/2018 1717   Sepsis Labs: @LABRCNTIP (procalcitonin:4,lacticidven:4)  ) Recent Results (from the past 240 hour(s))  Blood culture (routine x 2)     Status: None (Preliminary result)   Collection Time: 12/29/18  5:54 PM   Specimen: BLOOD  Result Value Ref Range Status   Specimen Description BLOOD SITE NOT SPECIFIED  Final   Special Requests   Final    BOTTLES DRAWN AEROBIC AND ANAEROBIC Blood Culture adequate volume   Culture   Final    NO  GROWTH < 24 HOURS Performed at [facility], [place] [address] [place], [place] [address]    Report Status PENDING  Incomplete  Blood culture (routine x 2)     Status: None (Preliminary result)   Collection Time: 12/29/18  5:57 PM   Specimen: BLOOD  Result Value Ref Range Status   Specimen Description BLOOD SITE NOT SPECIFIED  Final   Special Requests   Final    BOTTLES DRAWN AEROBIC ONLY Blood Culture results may not be optimal due to an inadequate volume of blood received in culture bottles   Culture   Final    NO GROWTH < 24 HOURS Performed at [facility], [place] [address], [place], [place] [address]    Report Status PENDING  Incomplete  SARS CORONAVIRUS 2 (TAT 6-24 HRS) Nasopharyngeal Nasopharyngeal Swab     Status:  None   Collection Time: 12/29/18  6:17 PM   Specimen: Nasopharyngeal Swab  Result Value Ref Range Status   SARS Coronavirus 2 NEGATIVE NEGATIVE Final    Comment: (NOTE) SARS-CoV-2 target nucleic acids are NOT DETECTED. The SARS-CoV-2 RNA is generally detectable in upper and lower respiratory specimens during the acute phase of infection. Negative results do not preclude SARS-CoV-2 infection, do not rule out co-infections with other pathogens, and should not be used as the sole basis for treatment or other patient management decisions. Negative results must be combined with clinical observations, patient history, and epidemiological information. The expected result is Negative. Fact Sheet for Patients: SugarRoll.be Fact Sheet for Healthcare Providers: https://www.woods-mathews.com/ This test is not yet approved or cleared by the [place] FDA and  has been authorized for detection and/or diagnosis of SARS-CoV-2 by FDA under an Emergency Use Authorization (EUA). This EUA will remain  in effect (meaning this test can be used) for the duration of the COVID-19 declaration under Section 56 4(b)(1) of the Act, 21  U.S.C. section 360bbb-3(b)(1), unless the authorization is terminated or revoked sooner. Performed at [facility], [place] [address], [place], [place] [address]          Radiology Studies: Ct Abdomen Pelvis W Contrast  Addendum Date: 12/29/2018   ADDENDUM REPORT: 12/29/2018 21:17 ADDENDUM: Study discussed by telephone with PA-C Regina on 12/29/2018 at 2109 hours. Given the setting of new anemia, we discussed that I favor a bleeding duodenal ulcer. Electronically Signed   By: Genevie Ann M.D.   On: 12/29/2018 21:17   Result Date: 12/29/2018 CLINICAL DATA:  58 year old female with right mid abdomen pain radiating to the flank. History of treated colon cancer in 2010. New anemia. EXAM: CT ABDOMEN AND PELVIS WITH CONTRAST TECHNIQUE: Multidetector CT imaging of the abdomen and pelvis was performed using the standard protocol following bolus administration of intravenous contrast. CONTRAST:  188mL OMNIPAQUE IOHEXOL 300 MG/ML  SOLN COMPARISON:  CT Abdomen and Pelvis 12/26/2018. FINDINGS: Lower chest: Stable mild lung base scarring. Mildly increased lung volumes and less lung base atelectasis. No pericardial or pleural effusion. Hepatobiliary: Liver enhancement remains normal. The gallbladder is larger since 12/26/2018, but not definitely inflamed. No bile duct enlargement identified. Pancreas: The pancreas appears stable despite continued inflammation in the 2nd portion of the duodenum. No pancreatic ductal dilatation. Spleen: Stable, negative. Adrenals/Urinary Tract: Normal adrenal glands. Left renal enhancement and contrast excretion remains within normal limits. Decompressed proximal left ureter. Heterogeneous enhancement of the right kidney persists. Multifocal right nephrolithiasis with calculi up to 6 millimeters in the pelvis and lower pole collecting system. Right periureteral stranding without right hydroureter. Diminutive and unremarkable urinary bladder. On the delayed images right  renal enhancement remains heterogeneous. Stomach/Bowel: There is a rectal anastomosis with no adverse features on series 3, image 84. Upstream large bowel with mild gas and some retained stool mixed with oral contrast. Greater volume of retained stool from the splenic flexure proximally. Normal appendix containing oral contrast on series 3, image 71. No large bowel inflammation. Negative terminal ileum. No dilated small bowel. No free air. The proximal stomach appears normal. But there is inflammation of the proximal duodenum maximal in the 2nd portion which appears thick walled with surrounding stranding (series 3, image 35). The lumen does appear irregular in this region such as on series 3, image 32. No extraluminal gas or collection identified. The distal duodenum remains normal. Vascular/Lymphatic: Major arterial structures are patent with mild aortic atherosclerosis.  Portal venous system is patent. No lymphadenopathy. Reproductive: Surgically absent. Other: No pelvic free fluid.  Stable mild presacral stranding. Musculoskeletal: Stable visualized osseous structures. There is a right S3 sacral nerve stimulator device with generator along the left flank, unchanged. IMPRESSION: 1. Continued inflammation of the proximal Duodenum which appears thick-walled and with luminal irregularity suggesting diverticulum or ulcer. No interval perforation is identified.  No abscess. As before, infectious duodenitis or inflamed ulcer disease is favored. EGD likely would be most valuable. 2. Continued abnormal enhancement of the right kidney with right renal and ureter inflammation compatible with Ascending Infection/pyelonephritis. No renal abscess. There is superimposed right nephrolithiasis, but no obstructive uropathy suspected. 3. Stable abdomen and pelvis otherwise. Electronically Signed: By: Genevie Ann M.D. On: 12/29/2018 20:23        Scheduled Meds: . insulin aspart  0-9 Units Subcutaneous Q4H  . levothyroxine  66  mcg Intravenous Daily  . [START ON 01/02/2019] pantoprazole  40 mg Intravenous Q12H   Continuous Infusions: . sodium chloride 75 mL/hr at 12/30/18 1227  . cefTRIAXone (ROCEPHIN)  IV 2 g (12/30/18 1506)  . pantoprozole (PROTONIX) infusion Stopped (12/30/18 0843)     LOS: 0 days    Time spent: 25min    Domenic Polite, MD [facility]  12/30/2018, 4:00 PM

## 2018-12-30 NOTE — Consult Note (Addendum)
Referring Provider:   [facility]        Primary Care Physician:  Tower, [name], MD Primary Gastroenterologist:   Thornton Park, MD          Reason for Consultation:   GI bleed.                ASSESSMENT /  PLAN    1, 58 yo female with upper GI bleed possibly secondary to duodenal ulcer from NSAIDS (CT scan). For further evaluation patient will need EGD.  --The risks and benefits of EGD were discussed and the patient agrees to proceed.  --Not actively bleeding, agree with BID IV PPI --Keep NPO in case of EGD today. If not going to be done then I will order clears.   2. ABL anemia. Hgb 12.2 >>> 10 >> 6.3. Getting a uPRBC.  --Monitor H+H and transfuse as needed.   3. Remote hx of sigmoid colon cancer, resection. Up to date on surveillance colonoscopy, last one was August 2020.    HPI:     Kelli Cruz is a 58 y.o. female with a pmh significant for hypothyroidism, DM, GERD, sigmoid colon cancer s/p resection. Seen in ED on 12/25/18 for treatment of UTI. CT scan at that time was concerning for pyelonephritis and inflammation involving second portion of duodenum adjacent to head of pancreas.  Her hgb was 10, down from baseline of 12.2. She came back to ED yesterday with flank pain despite antibiotics. Upon return to ED her hgb was 6.8, down to 6.3 today. BUN elevated at 38. FOBT negative.  Repeat CT scan remarkable for persistent inflammation of proximal duodenum. Also, right renal pyelonephritis.   Shenique had dark stools last week, no BM in since then. She has been vomiting dark liquid but thought it was soda. She denies abdominal pain. She has been taking Ibuprofen for headaches, no other NSAIDS. No on acid blocker. No history of PUD.    Past Medical History:  Diagnosis Date   Anemia    Anxiety    Arthritis    Bipolar 1 disorder ([place])    Cancer ([place])    sigmoid colon cancer   Depression    Diabetes mellitus    type II   Endometriosis    Family history of  malignant neoplasm of gastrointestinal tract    Ganglion cyst    GERD (gastroesophageal reflux disease)    Hyperlipidemia    Hypothyroidism    Insomnia due to mental condition     Past Surgical History:  Procedure Laterality Date   ABDOMINAL HYSTERECTOMY     ABDOMINAL HYSTERECTOMY     COLON RESECTION  June 2010   COLON SURGERY     COLONOSCOPY     DILATION AND CURETTAGE OF UTERUS  2006   miscarriage    GANGLION CYST EXCISION     HERNIA REPAIR     INTERSTIM IMPLANT PLACEMENT Left 12/11/2012   stimulator is on the left but the electrodes go to the right   [place] for endometriosis    Prior to Admission medications   Medication Sig Start Date End Date Taking? Authorizing Provider  ALPRAZolam Duanne Moron) 0.5 MG tablet Take 0.5 mg by mouth at bedtime as needed for anxiety (panic attack).    Yes [provider]  amphetamine-dextroamphetamine (ADDERALL XR) 30 MG 24 hr capsule Take 30 mg by mouth daily at 6 (six) AM.  Yes [provider]  amphetamine-dextroamphetamine (ADDERALL) 15 MG tablet Take 15 mg by mouth daily at 2 PM.  07/19/17  Yes [provider]  atorvastatin (LIPITOR) 10 MG tablet Take 1 tablet (10 mg total) by mouth daily. Patient taking differently: Take 10 mg by mouth at bedtime.  09/18/18  Yes Tower, [name], MD  cariprazine (VRAYLAR) capsule Take 3 mg by mouth See admin instructions. Take one capsule (3 mg) by mouth every other night   Yes [provider]  cefdinir (OMNICEF) 300 MG capsule Take 1 capsule (300 mg total) by mouth 2 (two) times daily. 12/26/18  Yes Charlann Lange, PA-C  Cholecalciferol (VITAMIN D3) 125 MCG (5000 UT) CAPS Take 5,000 Units by mouth daily.   Yes [provider]  citalopram (CELEXA) 40 MG tablet Take 40 mg by mouth at bedtime.  09/18/13  Yes [provider]  cyanocobalamin (,VITAMIN B-12,) 1000 MCG/ML injection Inject 1 mL (1,000 mcg  total) into the muscle every 3 (three) months. every 8 weeks Patient taking differently: Inject 1,000 mcg into the muscle every 30 (thirty) days.  03/08/18  Yes Tower, [name], MD  Cyanocobalamin (VITAMIN B-12) 5000 MCG TBDP Take 5,000 mcg by mouth daily.   Yes [provider]  doxepin (SINEQUAN) 50 MG capsule Take 50 mg by mouth at bedtime.  02/05/17  Yes [provider]  gabapentin (NEURONTIN) 300 MG capsule Take 1 capsule (300 mg total) by mouth 2 (two) times daily. 09/18/18  Yes Tower, Marne A, MD  glipiZIDE (GLUCOTROL XL) 5 MG 24 hr tablet Take 1 tablet (5 mg total) by mouth daily. 09/18/18  Yes Tower, [name], MD  ibuprofen (ADVIL) 200 MG tablet Take 600 mg by mouth every 6 (six) hours as needed for headache (pain).   Yes [provider]  levothyroxine (SYNTHROID) 112 MCG tablet Take 1 tablet (112 mcg total) by mouth daily. Patient taking differently: Take 112 mcg by mouth daily before breakfast.  09/18/18  Yes Tower, [name], MD  lisinopril (ZESTRIL) 5 MG tablet Take 0.5 tablets (2.5 mg total) by mouth daily. Patient taking differently: Take 2.5 mg by mouth at bedtime.  09/18/18  Yes Tower, [name], MD  metFORMIN (GLUCOPHAGE) 1000 MG tablet Take 1 tablet (1,000 mg total) by mouth 2 (two) times daily with a meal. 09/18/18  Yes Tower, [name], MD  ondansetron (ZOFRAN ODT) 4 MG disintegrating tablet Take 1 tablet (4 mg total) by mouth every 8 (eight) hours as needed for nausea or vomiting. 12/26/18  Yes Upstill, Nehemiah Settle, PA-C  Blood Glucose Monitoring Suppl ([place]) w/Device KIT Check sugar twice daily and as needed. DX 11.9 10/04/18   Tower, [name], MD  Diabetic Sterile Lancets MISC by Does not apply route. Check glucose two times a day and as needed for out of control DM 250.00     [provider]  glucose blood (ONETOUCH VERIO) test strip Strips for one touch verio flex meter. Check sugar twice daily DX E11.9 10/04/18   Tower, [place] A, MD  glucose  blood test strip 1 each by Other route. Check glucose two times a day and as needed for out of control DM 250.00     [provider]  [facility] FINEPOINT LANCETS MISC Lancets and device for one touch verio flex meter. Use daily. DX 11.9 10/04/18   Tower, [name], MD  oxyCODONE-acetaminophen (PERCOCET/ROXICET) 5-325 MG tablet Take 1 tablet by mouth every 6 (six) hours as needed  for severe pain. Patient not taking: Reported on 12/29/2018 12/26/18   Charlann Lange, PA-C  cholestyramine Lucrezia Starch) 4 G packet Mix with juice or water and drink every 1-3 days. 11/18/10 05/01/11  Inda Castle, MD    Current Facility-Administered Medications  Medication Dose Route Frequency Provider Last Rate Last Dose   0.9 %  sodium chloride infusion   Intravenous Continuous Rise Patience, MD   Stopped at 12/30/18 [phone]   acetaminophen (TYLENOL) tablet 650 mg  650 mg Oral Q6H PRN Rise Patience, MD       Or   acetaminophen (TYLENOL) suppository 650 mg  650 mg Rectal Q6H PRN Rise Patience, MD       cefTRIAXone (ROCEPHIN) 2 g in sodium chloride 0.9 % 100 mL IVPB  2 g Intravenous Q24H Rise Patience, MD       fentaNYL (SUBLIMAZE) injection 25 mcg  25 mcg Intravenous Q2H PRN Rise Patience, MD   25 mcg at 12/30/18 0616   insulin aspart (novoLOG) injection 0-9 Units  0-9 Units Subcutaneous Q4H Rise Patience, MD       levothyroxine (SYNTHROID, LEVOTHROID) injection 66 mcg  66 mcg Intravenous Daily Rise Patience, MD   Stopped at 12/30/18 0903   ondansetron (ZOFRAN) tablet 4 mg  4 mg Oral Q6H PRN Rise Patience, MD       Or   ondansetron [facility]) injection 4 mg  4 mg Intravenous Q6H PRN Rise Patience, MD       pantoprazole (PROTONIX) 80 mg in sodium chloride 0.9 % 250 mL (0.32 mg/mL) infusion  8 mg/hr Intravenous Continuous Rise Patience, MD   Stopped at 12/30/18 0843   [START ON 01/02/2019] pantoprazole (PROTONIX) injection 40 mg  40 mg  Intravenous Q12H Rise Patience, MD       Current Outpatient Medications  Medication Sig Dispense Refill   ALPRAZolam (XANAX) 0.5 MG tablet Take 0.5 mg by mouth at bedtime as needed for anxiety (panic attack).      amphetamine-dextroamphetamine (ADDERALL XR) 30 MG 24 hr capsule Take 30 mg by mouth daily at 6 (six) AM.      amphetamine-dextroamphetamine (ADDERALL) 15 MG tablet Take 15 mg by mouth daily at 2 PM.      atorvastatin (LIPITOR) 10 MG tablet Take 1 tablet (10 mg total) by mouth daily. (Patient taking differently: Take 10 mg by mouth at bedtime. ) 90 tablet 3   cariprazine (VRAYLAR) capsule Take 3 mg by mouth See admin instructions. Take one capsule (3 mg) by mouth every other night     cefdinir (OMNICEF) 300 MG capsule Take 1 capsule (300 mg total) by mouth 2 (two) times daily. 20 capsule 0   Cholecalciferol (VITAMIN D3) 125 MCG (5000 UT) CAPS Take 5,000 Units by mouth daily.     citalopram (CELEXA) 40 MG tablet Take 40 mg by mouth at bedtime.      cyanocobalamin (,VITAMIN B-12,) 1000 MCG/ML injection Inject 1 mL (1,000 mcg total) into the muscle every 3 (three) months. every 8 weeks (Patient taking differently: Inject 1,000 mcg into the muscle every 30 (thirty) days. ) 1 mL 5   Cyanocobalamin (VITAMIN B-12) 5000 MCG TBDP Take 5,000 mcg by mouth daily.     doxepin (SINEQUAN) 50 MG capsule Take 50 mg by mouth at bedtime.   1   gabapentin (NEURONTIN) 300 MG capsule Take 1 capsule (300 mg total) by mouth 2 (two) times daily. 180 capsule 3  glipiZIDE (GLUCOTROL XL) 5 MG 24 hr tablet Take 1 tablet (5 mg total) by mouth daily. 90 tablet 3   ibuprofen (ADVIL) 200 MG tablet Take 600 mg by mouth every 6 (six) hours as needed for headache (pain).     levothyroxine (SYNTHROID) 112 MCG tablet Take 1 tablet (112 mcg total) by mouth daily. (Patient taking differently: Take 112 mcg by mouth daily before breakfast. ) 90 tablet 3   lisinopril (ZESTRIL) 5 MG tablet Take 0.5 tablets  (2.5 mg total) by mouth daily. (Patient taking differently: Take 2.5 mg by mouth at bedtime. ) 45 tablet 3   metFORMIN (GLUCOPHAGE) 1000 MG tablet Take 1 tablet (1,000 mg total) by mouth 2 (two) times daily with a meal. 180 tablet 3   ondansetron (ZOFRAN ODT) 4 MG disintegrating tablet Take 1 tablet (4 mg total) by mouth every 8 (eight) hours as needed for nausea or vomiting. 20 tablet 0   Blood Glucose Monitoring Suppl ([place]) w/Device KIT Check sugar twice daily and as needed. DX 11.9 1 kit 12   Diabetic Sterile Lancets MISC by Does not apply route. Check glucose two times a day and as needed for out of control DM 250.00      glucose blood (ONETOUCH VERIO) test strip Strips for one touch verio flex meter. Check sugar twice daily DX E11.9 200 each 12   glucose blood test strip 1 each by Other route. Check glucose two times a day and as needed for out of control DM 250.00      [facility] FINEPOINT LANCETS MISC Lancets and device for one touch verio flex meter. Use daily. DX 11.9 200 each 12   oxyCODONE-acetaminophen (PERCOCET/ROXICET) 5-325 MG tablet Take 1 tablet by mouth every 6 (six) hours as needed for severe pain. (Patient not taking: Reported on 12/29/2018) 7 tablet 0    Allergies as of 12/29/2018 - Review Complete 12/29/2018  Allergen Reaction Noted   Hydrocodone Nausea And Vomiting 09/12/2006   Keflex [cephalexin] Nausea And Vomiting and Other (See Comments) 12/29/2018   Mirabegron Other (See Comments) 09/18/2018   Milk-related compounds Nausea And Vomiting 05/01/2011    Family History  Problem Relation Age of Onset   Breast cancer Mother    Diabetes Mother    Coronary artery disease Mother    Kidney disease Mother        renal insufficiency   Hyperlipidemia Mother    Diabetes Father    Coronary artery disease Father    Colon cancer Father    Colon cancer Paternal Grandmother    Breast cancer Maternal Aunt    Breast cancer Paternal  Aunt    Esophageal cancer Neg Hx    Rectal cancer Neg Hx    Stomach cancer Neg Hx     Social History   Socioeconomic History   Marital status: Married    Spouse name: Not on file   Number of children: 0   Years of education: Not on file   Highest education level: Not on file  Occupational History   Occupation: Banker   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     Employer: [employer]  Social Needs   Financial resource strain: Not on file   Food insecurity    Worry: Not on file    Inability: Not on file   Transportation needs    Medical: Not on file    Non-medical: Not on file  Tobacco Use   Smoking status: Passive Smoke Exposure -  Never Smoker   Smokeless tobacco: Never Used   Tobacco comment: husband quit smoking  Substance and Sexual Activity   Alcohol use: Yes    Alcohol/week: 0.0 standard drinks    Comment: socially twice a month at most or less   Drug use: No   Sexual activity: Not on file  Lifestyle   Physical activity    Days per week: Not on file    Minutes per session: Not on file   Stress: Not on file  Relationships   Social connections    Talks on phone: Not on file    Gets together: Not on file    Attends religious service: Not on file    Active member of club or organization: Not on file    Attends meetings of clubs or organizations: Not on file    Relationship status: Not on file   Intimate partner violence    Fear of current or ex partner: Not on file    Emotionally abused: Not on file    Physically abused: Not on file    Forced sexual activity: Not on file  Other Topics Concern   Not on file  Social History Narrative   Daily caffeine use    Review of Systems: All systems reviewed and negative except where noted in HPI.  Physical Exam: Vital signs in last 24 hours: Temp:  [98.1 F (36.7 C)-98.8 F (37.1 C)] 98.1 F (36.7 C) (11/16 0901) Pulse Rate:  [71-92] 74 (11/16 0901) Resp:  [8-17] 12 (11/16  0901) BP: (98-144)/(43-83) 101/55 (11/16 0901) SpO2:  [96 %-100 %] 98 % (11/16 0901) Weight:  [84.4 kg] 84.4 kg (11/15 1659)   General:   Alert, well-developed,  female in NAD Psych:  Pleasant, cooperative. Normal mood and affect. Eyes:  Pupils equal, sclera clear, no icterus.   Conjunctiva pink. Ears:  Normal auditory acuity. Nose:  No deformity, discharge,  or lesions. Neck:  Supple; no masses Lungs:  Clear throughout to auscultation.   No wheezes, crackles, or rhonchi.  Heart:  Regular rate and rhythm; no murmurs, no lower extremity edema Abdomen:  Soft, non-distended,mild-mod RUQ tenderness. BS active, no palp mass   Rectal:  Deferred  Msk:  Symmetrical without gross deformities. . Neurologic:  Alert and  oriented x4;  grossly normal neurologically. Skin:  Intact without significant lesions or rashes.   Intake/Output from previous day: 11/15 0701 - 11/16 0700 In: 13 [Blood:646; IV Piggyback:240] Out: -  Intake/Output this shift: Total I/O In: 1977 [I.V.:1700; Blood:277] Out: -   Lab Results: Recent Labs    12/29/18 1717 12/29/18 1809 12/30/18 0500  WBC 13.5*  --  8.3  HGB 6.8* 6.5* 6.3*  HCT 21.7* 19.0* 20.0*  PLT 498*  --  396   BMET Recent Labs    12/29/18 1717 12/29/18 1809 12/30/18 0500  NA 135 136 140  K 3.8 3.9 3.6  CL 101 106 113*  CO2 21*  --  20*  GLUCOSE 217* 183* 114*  BUN 38* 38* 29*  CREATININE 1.28* 1.10* 1.10*  CALCIUM 8.8*  --  8.0*   LFT Recent Labs    12/29/18 1717  PROT 6.2*  ALBUMIN 2.6*  AST 14*  ALT 12  ALKPHOS 62  BILITOT 0.3   PT/INR No results for input(s): LABPROT, INR in the last 72 hours. Hepatitis Panel No results for input(s): HEPBSAG, HCVAB, HEPAIGM, HEPBIGM in the last 72 hours.   . CBC Latest Ref Rng & Units 12/30/2018 12/29/2018 12/29/2018  WBC 4.0 - 10.5 K/uL 8.3 - 13.5(H)  Hemoglobin 12.0 - 15.0 g/dL 6.3(LL) 6.5(LL) 6.8(LL)  Hematocrit 36.0 - 46.0 % 20.0(L) 19.0(L) 21.7(L)  Platelets 150 - 400 K/uL  396 - 498(H)    . CMP Latest Ref Rng & Units 12/30/2018 12/29/2018 12/29/2018  Glucose 70 - 99 mg/dL 114(H) 183(H) 217(H)  BUN 6 - 20 mg/dL 29(H) 38(H) 38(H)  Creatinine 0.44 - 1.00 mg/dL 1.10(H) 1.10(H) 1.28(H)  Sodium 135 - 145 mmol/L 140 136 135  Potassium 3.5 - 5.1 mmol/L 3.6 3.9 3.8  Chloride 98 - 111 mmol/L 113(H) 106 101  CO2 22 - 32 mmol/L 20(L) - 21(L)  Calcium 8.9 - 10.3 mg/dL 8.0(L) - 8.8(L)  Total Protein 6.5 - 8.1 g/dL - - 6.2(L)  Total Bilirubin 0.3 - 1.2 mg/dL - - 0.3  Alkaline Phos 38 - 126 U/L - - 62  AST 15 - 41 U/L - - 14(L)  ALT 0 - 44 U/L - - 12   Studies/Results: Ct Abdomen Pelvis W Contrast  Addendum Date: 12/29/2018   ADDENDUM REPORT: 12/29/2018 21:17 ADDENDUM: Study discussed by telephone with PA-C CORTNI COUTURE on 12/29/2018 at 2109 hours. Given the setting of new anemia, we discussed that I favor a bleeding duodenal ulcer. Electronically Signed   By: Genevie Ann M.D.   On: 12/29/2018 21:17   Result Date: 12/29/2018 CLINICAL DATA:  58 year old female with right mid abdomen pain radiating to the flank. History of treated colon cancer in 2010. New anemia. EXAM: CT ABDOMEN AND PELVIS WITH CONTRAST TECHNIQUE: Multidetector CT imaging of the abdomen and pelvis was performed using the standard protocol following bolus administration of intravenous contrast. CONTRAST:  141m OMNIPAQUE IOHEXOL 300 MG/ML  SOLN COMPARISON:  CT Abdomen and Pelvis 12/26/2018. FINDINGS: Lower chest: Stable mild lung base scarring. Mildly increased lung volumes and less lung base atelectasis. No pericardial or pleural effusion. Hepatobiliary: Liver enhancement remains normal. The gallbladder is larger since 12/26/2018, but not definitely inflamed. No bile duct enlargement identified. Pancreas: The pancreas appears stable despite continued inflammation in the 2nd portion of the duodenum. No pancreatic ductal dilatation. Spleen: Stable, negative. Adrenals/Urinary Tract: Normal adrenal glands. Left  renal enhancement and contrast excretion remains within normal limits. Decompressed proximal left ureter. Heterogeneous enhancement of the right kidney persists. Multifocal right nephrolithiasis with calculi up to 6 millimeters in the pelvis and lower pole collecting system. Right periureteral stranding without right hydroureter. Diminutive and unremarkable urinary bladder. On the delayed images right renal enhancement remains heterogeneous. Stomach/Bowel: There is a rectal anastomosis with no adverse features on series 3, image 84. Upstream large bowel with mild gas and some retained stool mixed with oral contrast. Greater volume of retained stool from the splenic flexure proximally. Normal appendix containing oral contrast on series 3, image 71. No large bowel inflammation. Negative terminal ileum. No dilated small bowel. No free air. The proximal stomach appears normal. But there is inflammation of the proximal duodenum maximal in the 2nd portion which appears thick walled with surrounding stranding (series 3, image 35). The lumen does appear irregular in this region such as on series 3, image 32. No extraluminal gas or collection identified. The distal duodenum remains normal. Vascular/Lymphatic: Major arterial structures are patent with mild aortic atherosclerosis. Portal venous system is patent. No lymphadenopathy. Reproductive: Surgically absent. Other: No pelvic free fluid.  Stable mild presacral stranding. Musculoskeletal: Stable visualized osseous structures. There is a right S3 sacral nerve stimulator device with generator along the left flank, unchanged. IMPRESSION:  1. Continued inflammation of the proximal Duodenum which appears thick-walled and with luminal irregularity suggesting diverticulum or ulcer. No interval perforation is identified.  No abscess. As before, infectious duodenitis or inflamed ulcer disease is favored. EGD likely would be most valuable. 2. Continued abnormal enhancement of the  right kidney with right renal and ureter inflammation compatible with Ascending Infection/pyelonephritis. No renal abscess. There is superimposed right nephrolithiasis, but no obstructive uropathy suspected. 3. Stable abdomen and pelvis otherwise. Electronically Signed: By: Genevie Ann M.D. On: 12/29/2018 20:23    Principal Problem:   Acute GI bleeding Active Problems:   ADENOCARCINOMA, SIGMOID COLON   Hypothyroidism   Diabetes type 2, controlled ([place])   B12 deficiency   Acute pyelonephritis   Nausea & vomiting    Tye Savoy, NP-C @  12/30/2018, 9:56 AM   I have reviewed the entire case in detail with the above APP and discussed the plan in detail.  Therefore, I agree with the diagnoses recorded above. In addition,  I have personally interviewed and examined the patient and have personally reviewed any abdominal/pelvic CT scan images.  My additional thoughts are as follows:  This patient has severe right upper quadrant and flank pain, though it is difficult to tell which area it starts.  It has been going on for the last couple of weeks and steadily getting worse.  While she does have right renal stones and some reported inflammation around the collecting system and ureter, her urinalysis does not look like severe UTI.  No urine culture was drawn on the 12th, urine and blood cultures from last evening still pending.  I am more concerned about the severe inflammation of the duodenum seen on CT scan both on the 12th and yesterday.  I personally reviewed these images, and this is very worrisome for a deep chronic duodenal ulcer, especially in light of patient's chronic heavy NSAID use.  The area is somewhat difficult to completely visualize due to the degree of inflammation in the proximal duodenum adjacent to the pancreas, but the reported small area of fluid and focus of air could be a contained microperforation.  WBC is normal, which is somewhat reassuring in that regard.  She has severe  anemia of probable acute blood loss sometime in the last 7 to 10 days.  She has had no BM for about a week, but prior to that was seen black stool for least several days.  Overall, I think her primary problem is most likely NSAID related severe duodenal ulcer disease with recent bleeding.  She is quite tender on exam, pain is worse with movement and deep breaths.  She does not have a surgical abdomen bowel sounds are good, no distention.  She will need an upper endoscopy, but I feel it would be safest to wait until at least tomorrow, after she can receive PRBCs, Protonix drip, bowel rest and close observation. The upper endoscopy will be an increased risk procedure when it occurs, due to the increased risk of perforation if this is an ulcer as it appears to be.  She is aware that, and we will have further discussion about endoscopy as well as risks and benefits when we are closer to proceeding with that.  N.p.o. except ice chips, Protonix drip as noted above, transfuse blood and then serial hemoglobin and hematocrit we will follow closely.  Nelida Meuse III [phone]

## 2018-12-31 ENCOUNTER — Inpatient Hospital Stay (HOSPITAL_COMMUNITY): Payer: BC Managed Care – PPO | Admitting: Certified Registered Nurse Anesthetist

## 2018-12-31 ENCOUNTER — Encounter (HOSPITAL_COMMUNITY)
Admission: EM | Disposition: A | Discharge status: Home / Self Care | Payer: Self-pay | Source: Home / Self Care | Attending: Family Medicine

## 2018-12-31 ENCOUNTER — Encounter (HOSPITAL_COMMUNITY): Payer: Self-pay | Admitting: Emergency Medicine

## 2018-12-31 DIAGNOSIS — K264 Chronic or unspecified duodenal ulcer with hemorrhage: Secondary | ICD-10-CM | POA: Diagnosis not present

## 2018-12-31 DIAGNOSIS — K2960 Other gastritis without bleeding: Secondary | ICD-10-CM

## 2018-12-31 DIAGNOSIS — D62 Acute posthemorrhagic anemia: Secondary | ICD-10-CM | POA: Diagnosis not present

## 2018-12-31 DIAGNOSIS — K3189 Other diseases of stomach and duodenum: Secondary | ICD-10-CM | POA: Diagnosis not present

## 2018-12-31 DIAGNOSIS — K269 Duodenal ulcer, unspecified as acute or chronic, without hemorrhage or perforation: Secondary | ICD-10-CM | POA: Diagnosis not present

## 2018-12-31 DIAGNOSIS — R1011 Right upper quadrant pain: Secondary | ICD-10-CM | POA: Diagnosis not present

## 2018-12-31 DIAGNOSIS — N12 Tubulo-interstitial nephritis, not specified as acute or chronic: Secondary | ICD-10-CM | POA: Diagnosis not present

## 2018-12-31 DIAGNOSIS — E119 Type 2 diabetes mellitus without complications: Secondary | ICD-10-CM | POA: Diagnosis not present

## 2018-12-31 DIAGNOSIS — K922 Gastrointestinal hemorrhage, unspecified: Secondary | ICD-10-CM | POA: Diagnosis not present

## 2018-12-31 HISTORY — PX: BIOPSY: SHX5522

## 2018-12-31 HISTORY — PX: ESOPHAGOGASTRODUODENOSCOPY: SHX5428

## 2018-12-31 LAB — TYPE AND SCREEN
ABO/RH(D): O NEG
Antibody Screen: NEGATIVE
Unit division: 0
Unit division: 0

## 2018-12-31 LAB — BPAM RBC
Blood Product Expiration Date: 202011222359
Blood Product Expiration Date: 202011242359
ISSUE DATE / TIME: 202011152049
ISSUE DATE / TIME: 202011160819
Unit Type and Rh: 9500
Unit Type and Rh: 9500

## 2018-12-31 LAB — CBC
HCT: 23.9 % — ABNORMAL LOW (ref 36.0–46.0)
Hemoglobin: 7.9 g/dL — ABNORMAL LOW (ref 12.0–15.0)
MCH: 29.4 pg (ref 26.0–34.0)
MCHC: 33.1 g/dL (ref 30.0–36.0)
MCV: 88.8 fL (ref 80.0–100.0)
Platelets: 397 10*3/uL (ref 150–400)
RBC: 2.69 MIL/uL — ABNORMAL LOW (ref 3.87–5.11)
RDW: 17.3 % — ABNORMAL HIGH (ref 11.5–15.5)
WBC: 8 10*3/uL (ref 4.0–10.5)
nRBC: 0 % (ref 0.0–0.2)

## 2018-12-31 LAB — BASIC METABOLIC PANEL
Anion gap: 12 (ref 5–15)
BUN: 22 mg/dL — ABNORMAL HIGH (ref 6–20)
CO2: 19 mmol/L — ABNORMAL LOW (ref 22–32)
Calcium: 8.6 mg/dL — ABNORMAL LOW (ref 8.9–10.3)
Chloride: 110 mmol/L (ref 98–111)
Creatinine, Ser: 1.07 mg/dL — ABNORMAL HIGH (ref 0.44–1.00)
GFR calc Af Amer: >60 mL/min (ref >60)
GFR calc non Af Amer: 58 mL/min — ABNORMAL LOW (ref >60)
Glucose, Bld: 113 mg/dL — ABNORMAL HIGH (ref 70–99)
Potassium: 3.6 mmol/L (ref 3.5–5.1)
Sodium: 141 mmol/L (ref 135–145)

## 2018-12-31 LAB — GLUCOSE, CAPILLARY
Glucose-Capillary: 104 mg/dL — ABNORMAL HIGH (ref 70–99)
Glucose-Capillary: 106 mg/dL — ABNORMAL HIGH (ref 70–99)
Glucose-Capillary: 109 mg/dL — ABNORMAL HIGH (ref 70–99)
Glucose-Capillary: 140 mg/dL — ABNORMAL HIGH (ref 70–99)
Glucose-Capillary: 191 mg/dL — ABNORMAL HIGH (ref 70–99)
Glucose-Capillary: 99 mg/dL (ref 70–99)

## 2018-12-31 LAB — HIV ANTIBODY (ROUTINE TESTING W REFLEX): HIV Screen 4th Generation wRfx: NONREACTIVE — AB

## 2018-12-31 SURGERY — EGD (ESOPHAGOGASTRODUODENOSCOPY)
Anesthesia: Monitor Anesthesia Care

## 2018-12-31 MED ORDER — LACTATED RINGERS IV SOLN
INTRAVENOUS | Status: AC | PRN
  Administered 2018-12-31: 1000 mL via INTRAVENOUS

## 2018-12-31 MED ORDER — LIDOCAINE 2% (20 MG/ML) 5 ML SYRINGE
INTRAMUSCULAR | Status: DC | PRN
  Administered 2018-12-31: 80 mg via INTRAVENOUS

## 2018-12-31 MED ORDER — PROPOFOL 500 MG/50ML IV EMUL
INTRAVENOUS | Status: DC | PRN
  Administered 2018-12-31: 125 ug/kg/min via INTRAVENOUS

## 2018-12-31 MED ORDER — SODIUM CHLORIDE 0.9 % IV SOLN
INTRAVENOUS | Status: DC | PRN
  Administered 2018-12-31: 09:00:00 via INTRAVENOUS

## 2018-12-31 NOTE — Op Note (Signed)
[facility] Patient Name: Kelli Cruz Procedure Date : 12/31/2018 MRN: [id number] Attending MD: Estill Cotta. Danis , MD Date of Birth: Nov 17, 1960 CSN: [id number] Age: 58 Admit Type: Inpatient Procedure:                Upper GI endoscopy Indications:              Abdominal pain in the right upper quadrant, Acute                            post hemorrhagic anemia, Melena (last week),                            Abnormal CT of the GI tract(severe duodenal                            inflamation) - chronic NSAID use, suspected PUD Providers:                Mallie Mussel L. Loletha Carrow, MD, Ashley Jacobs, RN, William Dalton, Technician Referring MD:             [facility] (Dr. Domenic Polite, who was                            updated after procedure) Medicines:                Monitored Anesthesia Care Complications:            No immediate complications. Estimated Blood Loss:     Estimated blood loss was minimal. Procedure:                Pre-Anesthesia Assessment:                           - Prior to the procedure, a History and Physical                            was performed, and patient medications and                            allergies were reviewed. [name] tolerance of                            previous anesthesia was also reviewed. The risks                            and benefits of the procedure and the sedation                            options and risks were discussed with the patient.                            All questions were answered, and informed consent  was obtained. Prior Anticoagulants: The patient has                            taken no previous anticoagulant or antiplatelet                            agents. ASA Grade Assessment: III - A patient with                            severe systemic disease. After reviewing the risks                            and benefits, the patient was deemed in                           satisfactory condition to undergo the procedure.                           After obtaining informed consent, the endoscope was                            passed under direct vision. Throughout the                            procedure, the patient's blood pressure, pulse, and                            oxygen saturations were monitored continuously. The                            [id number] Olympus gastroscope was                            introduced through the mouth, and advanced to the                            duodenal bulb. The upper GI endoscopy was                            accomplished without difficulty. The patient                            tolerated the procedure well. Scope In: Scope Out: Findings:      The esophagus was normal.      Multiple small erosions with no bleeding and no stigmata of recent       bleeding were found in the gastric antrum. Biopsies were taken with a       cold forceps for histology.      The exam of the stomach was otherwise normal.      The cardia and gastric fundus were normal on retroflexion.      Few non-bleeding superficial duodenal ulcers were found in the duodenal       bulb. The largest lesion was 3 mm in largest dimension.      One non-bleeding cratered duodenal ulcer with  no stigmata of bleeding       was found in the first portion of the duodenum, within the duodenal       sweep on the medial wall. There was marked adjacent edema, and the       decision was made not to attempt further scope advancement and risk       perforation. Therefore, the ulcer was not completely visualized. Impression:               - Normal esophagus.                           - Erosive gastropathy with no bleeding and no                            stigmata of recent bleeding. Biopsied.                           - Non-bleeding duodenal ulcers.                           - Non-bleeding duodenal ulcer with no stigmata of                             bleeding. Cause of symptoms and recent bleeding. Recommendation:           - Return patient to hospital ward for ongoing care                            and close observation.                           - Clear liquid diet.                           - Continue present medications, including protonix                            drip.                           - Await pathology results.                           - Complete NSAID avoidance.                           - If patient has clincal signs of perforation,                            obtain urgent surgical consult. Procedure Code(s):        --- Professional ---                           [phone], Esophagogastroduodenoscopy, flexible,                            transoral; with biopsy, single or multiple Diagnosis Code(s):        ---  Professional ---                           K31.89, Other diseases of stomach and duodenum                           K26.9, Duodenal ulcer, unspecified as acute or                            chronic, without hemorrhage or perforation                           R10.11, Right upper quadrant pain                           D62, Acute posthemorrhagic anemia                           K92.1, Melena (includes Hematochezia)                           R93.3, Abnormal findings on diagnostic imaging of                            other parts of digestive tract CPT copyright 2019 [employer]. All rights reserved. The codes documented in this report are preliminary and upon coder review may  be revised to meet current compliance requirements. Henry L. Loletha Carrow, MD 12/31/2018 9:53:59 AM This report has been signed electronically. Number of Addenda: 0

## 2018-12-31 NOTE — Plan of Care (Signed)

## 2018-12-31 NOTE — H&P (View-Only) (Signed)
Feels about the same today.  No overt GI bleeding since admission - Hgb 7.9 after PRBC transfusion.  BUN decreased and creatinine normal. K+ = 3.6  WBC 8.0, no fever documented  On pantoprazole drip.  I spoke with her about EGD today, described risks and benefits and she was agreeable.  The benefits and risks of the planned procedure were described in detail with the patient or (when appropriate) their health care proxy.  Risks were outlined as including, but not limited to, bleeding, infection, perforation, adverse medication reaction leading to cardiac or pulmonary decompensation, pancreatitis (if ERCP).  The limitation of incomplete mucosal visualization was also discussed.  No guarantees or warranties were given.  Plan reviewed with patient's nurse as well.

## 2018-12-31 NOTE — Progress Notes (Signed)
Feels about the same today.  No overt GI bleeding since admission - Hgb 7.9 after PRBC transfusion.  BUN decreased and creatinine normal. K+ = 3.6  WBC 8.0, no fever documented  On pantoprazole drip.  I spoke with her about EGD today, described risks and benefits and she was agreeable.  The benefits and risks of the planned procedure were described in detail with the patient or (when appropriate) their health care proxy.  Risks were outlined as including, but not limited to, bleeding, infection, perforation, adverse medication reaction leading to cardiac or pulmonary decompensation, pancreatitis (if ERCP).  The limitation of incomplete mucosal visualization was also discussed.  No guarantees or warranties were given.  Plan reviewed with patient's nurse as well.

## 2018-12-31 NOTE — Anesthesia Postprocedure Evaluation (Signed)
Anesthesia Post Note  Patient: Mackenize Delgadillo Kittell  Procedure(s) Performed: ESOPHAGOGASTRODUODENOSCOPY (EGD) (N/A ) BIOPSY     Patient location during evaluation: Endoscopy Anesthesia Type: MAC Level of consciousness: awake and alert Pain management: pain level controlled Vital Signs Assessment: post-procedure vital signs reviewed and stable Respiratory status: spontaneous breathing, nonlabored ventilation and respiratory function stable Cardiovascular status: blood pressure returned to baseline and stable Postop Assessment: no apparent nausea or vomiting Anesthetic complications: no    Last Vitals:  Vitals:   12/31/18 1033 12/31/18 1201  BP: (!) 120/58 139/60  Pulse: 63 66  Resp: 16 18  Temp: 36.6 C 36.7 C  SpO2: 100% 100%    Last Pain:  Vitals:   12/31/18 1201  TempSrc: Oral  PainSc:                  Lidia Collum

## 2018-12-31 NOTE — Progress Notes (Signed)
Pt to endo. 

## 2018-12-31 NOTE — Anesthesia Preprocedure Evaluation (Signed)
Anesthesia Evaluation  Patient identified by MRN, date of birth, ID band Patient awake    Reviewed: Allergy & Precautions, NPO status , Patient's Chart, lab work & pertinent test results  History of Anesthesia Complications Negative for: history of anesthetic complications  Airway Mallampati: II  TM Distance: >3 FB Neck ROM: Full    Dental   Pulmonary neg pulmonary ROS,    Pulmonary exam normal        Cardiovascular negative cardio ROS Normal cardiovascular exam     Neuro/Psych PSYCHIATRIC DISORDERS Anxiety Depression Bipolar Disorder negative neurological ROS     GI/Hepatic Neg liver ROS, GERD  ,  Endo/Other  diabetes, Type 2Hypothyroidism   Renal/GU Renal disease (pyelo)  negative genitourinary   Musculoskeletal negative musculoskeletal ROS (+)   Abdominal   Peds  Hematology  (+) anemia ,   Anesthesia Other Findings   Reproductive/Obstetrics                            Anesthesia Physical Anesthesia Plan  ASA: III  Anesthesia Plan: MAC   Post-op Pain Management:    Induction: Intravenous  PONV Risk Score and Plan: 2 and Propofol infusion, TIVA and Treatment may vary due to age or medical condition  Airway Management Planned: Natural Airway, Nasal Cannula and Simple Face Mask  Additional Equipment: None  Intra-op Plan:   Post-operative Plan:   Informed Consent: I have reviewed the patients History and Physical, chart, labs and discussed the procedure including the risks, benefits and alternatives for the proposed anesthesia with the patient or authorized representative who has indicated his/her understanding and acceptance.       Plan Discussed with:   Anesthesia Plan Comments:        Anesthesia Quick Evaluation

## 2018-12-31 NOTE — Progress Notes (Signed)
PROGRESS NOTE    Sheriyah Cedano Weill  [id number] DOB: 18-Aug-1960 DOA: 12/29/2018 PCP: Abner Greenspan, MD  Brief Narrative:  Kelli Cruz is a 58 y.o. female with history of diabetes mellitus type 2, hypothyroidism, colon cancer status post resection in 2010 being followed by Dr. Collene Mares GI  presented to the ER for the second time in the last 48 hours because of persistent pain in the right flank with persistent nausea vomiting.   -Patient is followed by urologist and reportedly on her third course of antibiotics for UTI in 9 weeks, currently on Keflex which was prescribed 8 days ago . -She has also been taking a lot of ibuprofen this week for severe right flank pain and did notice some dark stools about a week ago -In the ED she was noted to have a hemoglobin of 6.5, abnormal urinalysis she had a CT abdomen pelvis concerning for duodenal ulcer and right pyelonephritis without obstruction  Assessment & Plan:   Multiple duodenal ulcers erosive gastropathy -Secondary to excessive NSAID use -Discussed with gastroenterology Dr. Simona Huh, one of the duodenal ulcers was quite cratered, deep, suspected to be close to the serosa with marked adjacent edema, recommended continue Protonix infusion, liquids only -Status post 2 units of PRBC on admission -Monitor hemoglobin closely, 7.9 today was 6.3 and on admission  Acute blood loss anemia -As above  Right pyelonephritis, recurrent UTI -There is superimposed right nephrolithiasis but no obstructive uropathy on CT -Continue IV ceftriaxone, urine culture negative however this is partially treated since she was on multiple rounds of oral antibiotics, continue IV ceftriaxone for few more days IV  -Close follow-up with urology in [place]  Type 2 diabetes mellitus -Hold oral hypoglycemics, sliding scale insulin for now  History of colon cancer -History of resection in 2010, follow-up with gastroenterology Dr. Collene Mares  DVT prophylaxis: SCDs due to GI  bleed. Code Status: Full code. Family Communication: Discussed with patient. Disposition Plan: home in 2 to 3 days   Consultants:   [facility] covering for Dr.Mann   Procedures:   Antimicrobials:    Subjective: -Just back from endoscopy, continues to have severe epigastric, right-sided abdominal pain   Objective: Vitals:   12/31/18 0938 12/31/18 0948 12/31/18 1033 12/31/18 1201  BP: 111/63 116/66 (!) 120/58 139/60  Pulse: 61 64 63 66  Resp: 10 10 16 18   Temp: (!) 96 F (35.6 C)  97.9 F (36.6 C) 98.1 F (36.7 C)  TempSrc: Temporal  Oral Oral  SpO2: 100% 98% 100% 100%  Weight:      Height:        Intake/Output Summary (Last 24 hours) at 12/31/2018 1447 Last data filed at 12/31/2018 [id number] Gross per 24 hour  Intake 640.58 ml  Output -  Net 640.58 ml   Filed Weights   12/29/18 1659 12/31/18 0053 12/31/18 0838  Weight: 84.4 kg 86.4 kg 86.4 kg    Examination:  Gen: Awake, Alert, Oriented X 3, ill-appearing HEENT: PERRLA, Neck supple, no JVD Lungs: Decreased breath sounds at both bases CVS: RRR,No Gallops,Rubs or new Murmurs Abd: Soft, mild epigastric tenderness, mild right flank tenderness is improving, nondistended, bowel sounds present Extremities: No edema Skin: no new rashes Psychiatry:  Mood & affect appropriate.     Data Reviewed:   CBC: Recent Labs  Lab 12/25/18 1719 12/29/18 1717 12/29/18 1809 12/30/18 0500 12/30/18 1314 12/31/18 0525  WBC 8.5 13.5*  --  8.3 10.0 8.0  NEUTROABS  --  10.8*  --   --   --   --  HGB 10.1* 6.8* 6.5* 6.3* 8.2* 7.9*  HCT 32.0* 21.7* 19.0* 20.0* 25.4* 23.9*  MCV 95.8 93.9  --  95.2 91.0 88.8  PLT 421* 498*  --  396 419* [id number]   Basic Metabolic Panel: Recent Labs  Lab 12/25/18 1719 12/29/18 1717 12/29/18 1809 12/30/18 0500 12/31/18 0525  NA 136 135 136 140 141  K 4.3 3.8 3.9 3.6 3.6  CL 105 101 106 113* 110  CO2 18* 21*  --  20* 19*  GLUCOSE 178* 217* 183* 114* 113*  BUN 15 38* 38* 29* 22*  CREATININE  1.31* 1.28* 1.10* 1.10* 1.07*  CALCIUM 9.4 8.8*  --  8.0* 8.6*   GFR: Estimated Creatinine Clearance: 61.2 mL/min (A) (by C-G formula based on SCr of 1.07 mg/dL (H)). Liver Function Tests: Recent Labs  Lab 12/25/18 1719 12/29/18 1717  AST 16 14*  ALT 16 12  ALKPHOS 73 62  BILITOT 0.4 0.3  PROT 6.8 6.2*  ALBUMIN 2.8* 2.6*   Recent Labs  Lab 12/25/18 1719 12/29/18 1717  LIPASE 28 15   No results for input(s): AMMONIA in the last 168 hours. Coagulation Profile: No results for input(s): INR, PROTIME in the last 168 hours. Cardiac Enzymes: No results for input(s): CKTOTAL, CKMB, CKMBINDEX, TROPONINI in the last 168 hours. BNP (last 3 results) No results for input(s): PROBNP in the last 8760 hours. HbA1C: No results for input(s): HGBA1C in the last 72 hours. CBG: Recent Labs  Lab 12/30/18 2035 12/31/18 0057 12/31/18 0439 12/31/18 0744 12/31/18 1103  GLUCAP 107* 99 104* 106* 109*   Lipid Profile: No results for input(s): CHOL, HDL, LDLCALC, TRIG, CHOLHDL, LDLDIRECT in the last 72 hours. Thyroid Function Tests: No results for input(s): TSH, T4TOTAL, FREET4, T3FREE, THYROIDAB in the last 72 hours. Anemia Panel: No results for input(s): VITAMINB12, FOLATE, FERRITIN, TIBC, IRON, RETICCTPCT in the last 72 hours. Urine analysis:    Component Value Date/Time   COLORURINE YELLOW 12/29/2018 1717   APPEARANCEUR CLEAR 12/29/2018 1717   LABSPEC 1.017 12/29/2018 1717   PHURINE 5.0 12/29/2018 1717   GLUCOSEU NEGATIVE 12/29/2018 1717   HGBUR NEGATIVE 12/29/2018 1717   HGBUR negative 06/19/2008 1232   BILIRUBINUR NEGATIVE 12/29/2018 1717   KETONESUR 5 (A) 12/29/2018 1717   PROTEINUR NEGATIVE 12/29/2018 1717   UROBILINOGEN 0.2 05/01/2011 1032   NITRITE NEGATIVE 12/29/2018 1717   LEUKOCYTESUR TRACE (A) 12/29/2018 1717   Sepsis Labs: @LABRCNTIP (procalcitonin:4,lacticidven:4)  ) Recent Results (from the past 240 hour(s))  Urine culture     Status: Abnormal   Collection  Time: 12/29/18  5:17 PM   Specimen: Urine, Random  Result Value Ref Range Status   Specimen Description URINE, RANDOM  Final   Special Requests NONE  Final   Culture (A)  Final    <10,000 COLONIES/mL INSIGNIFICANT GROWTH Performed at [facility], [place] [address] [place], [place] [address]    Report Status 12/30/2018 FINAL  Final  Blood culture (routine x 2)     Status: None (Preliminary result)   Collection Time: 12/29/18  5:54 PM   Specimen: BLOOD  Result Value Ref Range Status   Specimen Description BLOOD SITE NOT SPECIFIED  Final   Special Requests   Final    BOTTLES DRAWN AEROBIC AND ANAEROBIC Blood Culture adequate volume   Culture   Final    NO GROWTH 2 DAYS Performed at [facility], [place] [address], [place], [place] [address]    Report Status PENDING  Incomplete  Blood culture (routine x 2)     Status: None (Preliminary result)   Collection Time: 12/29/18  5:57 PM   Specimen: BLOOD  Result Value Ref Range Status   Specimen Description BLOOD SITE NOT SPECIFIED  Final   Special Requests   Final    BOTTLES DRAWN AEROBIC ONLY Blood Culture results may not be optimal due to an inadequate volume of blood received in culture bottles   Culture   Final    NO GROWTH 2 DAYS Performed at [facility], [place] [address], [place], [place] [address]    Report Status PENDING  Incomplete  SARS CORONAVIRUS 2 (TAT 6-24 HRS) Nasopharyngeal Nasopharyngeal Swab     Status: None   Collection Time: 12/29/18  6:17 PM   Specimen: Nasopharyngeal Swab  Result Value Ref Range Status   SARS Coronavirus 2 NEGATIVE NEGATIVE Final    Comment: (NOTE) SARS-CoV-2 target nucleic acids are NOT DETECTED. The SARS-CoV-2 RNA is generally detectable in upper and lower respiratory specimens during the acute phase of infection. Negative results do not preclude SARS-CoV-2 infection, do not rule out co-infections with other pathogens, and should not be used as the sole basis for treatment or  other patient management decisions. Negative results must be combined with clinical observations, patient history, and epidemiological information. The expected result is Negative. Fact Sheet for Patients: SugarRoll.be Fact Sheet for Healthcare Providers: https://www.woods-mathews.com/ This test is not yet approved or cleared by the [place] FDA and  has been authorized for detection and/or diagnosis of SARS-CoV-2 by FDA under an Emergency Use Authorization (EUA). This EUA will remain  in effect (meaning this test can be used) for the duration of the COVID-19 declaration under Section 56 4(b)(1) of the Act, 21 U.S.C. section 360bbb-3(b)(1), unless the authorization is terminated or revoked sooner. Performed at [facility], [place] [address], [place], [place] [address]          Radiology Studies: Ct Abdomen Pelvis W Contrast  Addendum Date: 12/29/2018   ADDENDUM REPORT: 12/29/2018 21:17 ADDENDUM: Study discussed by telephone with PA-C Mingus on 12/29/2018 at 2109 hours. Given the setting of new anemia, we discussed that I favor a bleeding duodenal ulcer. Electronically Signed   By: Genevie Ann M.D.   On: 12/29/2018 21:17   Result Date: 12/29/2018 CLINICAL DATA:  58 year old female with right mid abdomen pain radiating to the flank. History of treated colon cancer in 2010. New anemia. EXAM: CT ABDOMEN AND PELVIS WITH CONTRAST TECHNIQUE: Multidetector CT imaging of the abdomen and pelvis was performed using the standard protocol following bolus administration of intravenous contrast. CONTRAST:  142mL OMNIPAQUE IOHEXOL 300 MG/ML  SOLN COMPARISON:  CT Abdomen and Pelvis 12/26/2018. FINDINGS: Lower chest: Stable mild lung base scarring. Mildly increased lung volumes and less lung base atelectasis. No pericardial or pleural effusion. Hepatobiliary: Liver enhancement remains normal. The gallbladder is larger since 12/26/2018, but not  definitely inflamed. No bile duct enlargement identified. Pancreas: The pancreas appears stable despite continued inflammation in the 2nd portion of the duodenum. No pancreatic ductal dilatation. Spleen: Stable, negative. Adrenals/Urinary Tract: Normal adrenal glands. Left renal enhancement and contrast excretion remains within normal limits. Decompressed proximal left ureter. Heterogeneous enhancement of the right kidney persists. Multifocal right nephrolithiasis with calculi up to 6 millimeters in the pelvis and lower pole collecting system. Right periureteral stranding without right hydroureter. Diminutive and unremarkable urinary bladder. On the delayed images right renal enhancement remains heterogeneous. Stomach/Bowel: There is a rectal anastomosis  with no adverse features on series 3, image 84. Upstream large bowel with mild gas and some retained stool mixed with oral contrast. Greater volume of retained stool from the splenic flexure proximally. Normal appendix containing oral contrast on series 3, image 71. No large bowel inflammation. Negative terminal ileum. No dilated small bowel. No free air. The proximal stomach appears normal. But there is inflammation of the proximal duodenum maximal in the 2nd portion which appears thick walled with surrounding stranding (series 3, image 35). The lumen does appear irregular in this region such as on series 3, image 32. No extraluminal gas or collection identified. The distal duodenum remains normal. Vascular/Lymphatic: Major arterial structures are patent with mild aortic atherosclerosis. Portal venous system is patent. No lymphadenopathy. Reproductive: Surgically absent. Other: No pelvic free fluid.  Stable mild presacral stranding. Musculoskeletal: Stable visualized osseous structures. There is a right S3 sacral nerve stimulator device with generator along the left flank, unchanged. IMPRESSION: 1. Continued inflammation of the proximal Duodenum which appears  thick-walled and with luminal irregularity suggesting diverticulum or ulcer. No interval perforation is identified.  No abscess. As before, infectious duodenitis or inflamed ulcer disease is favored. EGD likely would be most valuable. 2. Continued abnormal enhancement of the right kidney with right renal and ureter inflammation compatible with Ascending Infection/pyelonephritis. No renal abscess. There is superimposed right nephrolithiasis, but no obstructive uropathy suspected. 3. Stable abdomen and pelvis otherwise. Electronically Signed: By: Genevie Ann M.D. On: 12/29/2018 20:23        Scheduled Meds: . insulin aspart  0-9 Units Subcutaneous Q4H  . levothyroxine  66 mcg Intravenous Daily  . [START ON 01/02/2019] pantoprazole  40 mg Intravenous Q12H   Continuous Infusions: . cefTRIAXone (ROCEPHIN)  IV 2 g (12/30/18 1506)  . pantoprozole (PROTONIX) infusion 8 mg/hr (12/31/18 1038)     LOS: 1 day    Time spent: 80min    Domenic Polite, MD [facility]  12/31/2018, 2:47 PM

## 2018-12-31 NOTE — Progress Notes (Signed)
Pt retd from Endo. AAOx4, VSS

## 2018-12-31 NOTE — Transfer of Care (Signed)
Immediate Anesthesia Transfer of Care Note  Patient: Kelli Cruz  Procedure(s) Performed: ESOPHAGOGASTRODUODENOSCOPY (EGD) (N/A ) BIOPSY  Patient Location: Endoscopy Unit  Anesthesia Type:MAC  Level of Consciousness: awake, patient cooperative and responds to stimulation  Airway & Oxygen Therapy: Patient Spontanous Breathing and Patient connected to nasal cannula oxygen  Post-op Assessment: Report given to RN and Post -op Vital signs reviewed and stable  Post vital signs: Reviewed and stable  Last Vitals:  Vitals Value Taken Time  BP 111/63 12/31/18 0938  Temp    Pulse 63 12/31/18 0938  Resp 14 12/31/18 0938  SpO2 100 % 12/31/18 0938  Vitals shown include unvalidated device data.  Last Pain:  Vitals:   12/31/18 0838  TempSrc: Temporal  PainSc: 7       Patients Stated Pain Goal: 2 (91/47/82 9562)  Complications: No apparent anesthesia complications

## 2018-12-31 NOTE — Interval H&P Note (Signed)
History and Physical Interval Note:  12/31/2018 9:13 AM  Kelli Cruz  has presented today for surgery, with the diagnosis of acute blood loss anemia, ruq pain.  The various methods of treatment have been discussed with the patient and family. After consideration of risks, benefits and other options for treatment, the patient has consented to  Procedure(s): ESOPHAGOGASTRODUODENOSCOPY (EGD) (N/A) as a surgical intervention.  [name] history has been reviewed, patient examined, no change in status, stable for surgery.  I have reviewed the patient's chart and labs.  Questions were answered to the patient's satisfaction.     Nelida Meuse III

## 2019-01-01 ENCOUNTER — Encounter (HOSPITAL_COMMUNITY): Payer: Self-pay | Admitting: Gastroenterology

## 2019-01-01 DIAGNOSIS — K264 Chronic or unspecified duodenal ulcer with hemorrhage: Principal | ICD-10-CM

## 2019-01-01 DIAGNOSIS — R112 Nausea with vomiting, unspecified: Secondary | ICD-10-CM

## 2019-01-01 LAB — BASIC METABOLIC PANEL
Anion gap: 11 (ref 5–15)
BUN: 17 mg/dL (ref 6–20)
CO2: 22 mmol/L (ref 22–32)
Calcium: 8.7 mg/dL — ABNORMAL LOW (ref 8.9–10.3)
Chloride: 107 mmol/L (ref 98–111)
Creatinine, Ser: 1.03 mg/dL — ABNORMAL HIGH (ref 0.44–1.00)
GFR calc Af Amer: >60 mL/min (ref >60)
GFR calc non Af Amer: >60 mL/min (ref >60)
Glucose, Bld: 112 mg/dL — ABNORMAL HIGH (ref 70–99)
Potassium: 3.2 mmol/L — ABNORMAL LOW (ref 3.5–5.1)
Sodium: 140 mmol/L (ref 135–145)

## 2019-01-01 LAB — GLUCOSE, CAPILLARY
Glucose-Capillary: 104 mg/dL — ABNORMAL HIGH (ref 70–99)
Glucose-Capillary: 125 mg/dL — ABNORMAL HIGH (ref 70–99)
Glucose-Capillary: 132 mg/dL — ABNORMAL HIGH (ref 70–99)
Glucose-Capillary: 133 mg/dL — ABNORMAL HIGH (ref 70–99)
Glucose-Capillary: 134 mg/dL — ABNORMAL HIGH (ref 70–99)
Glucose-Capillary: 171 mg/dL — ABNORMAL HIGH (ref 70–99)
Glucose-Capillary: 98 mg/dL (ref 70–99)
Glucose-Capillary: 99 mg/dL (ref 70–99)

## 2019-01-01 LAB — CBC
HCT: 26.4 % — ABNORMAL LOW (ref 36.0–46.0)
Hemoglobin: 8.4 g/dL — ABNORMAL LOW (ref 12.0–15.0)
MCH: 29.1 pg (ref 26.0–34.0)
MCHC: 31.8 g/dL (ref 30.0–36.0)
MCV: 91.3 fL (ref 80.0–100.0)
Platelets: 447 10*3/uL — ABNORMAL HIGH (ref 150–400)
RBC: 2.89 MIL/uL — ABNORMAL LOW (ref 3.87–5.11)
RDW: 16.8 % — ABNORMAL HIGH (ref 11.5–15.5)
WBC: 8.5 10*3/uL (ref 4.0–10.5)
nRBC: 0 % (ref 0.0–0.2)

## 2019-01-01 LAB — SURGICAL PATHOLOGY

## 2019-01-01 MED ORDER — POTASSIUM CHLORIDE CRYS ER 20 MEQ PO TBCR
40.0000 meq | EXTENDED_RELEASE_TABLET | Freq: Every day | ORAL | Status: DC
  Administered 2019-01-01 – 2019-01-03 (×3): 40 meq via ORAL
  Filled 2019-01-01 (×3): qty 2

## 2019-01-01 MED ORDER — PANTOPRAZOLE SODIUM 40 MG PO TBEC
40.0000 mg | DELAYED_RELEASE_TABLET | Freq: Two times a day (BID) | ORAL | Status: DC
  Administered 2019-01-02 – 2019-01-03 (×3): 40 mg via ORAL
  Filled 2019-01-01 (×3): qty 1

## 2019-01-01 NOTE — Progress Notes (Addendum)
Hospitalist progress note  If 7PM-7AM,  night-coverage-look on AMION -prefer pages-not epic celes, mossey Charon OT:[id number] DOB: 23-Dec-1960 DOA: 12/29/2018  PCP: Abner Greenspan, MD   Narrative:  58 year old white female  known prior history of severe endometriosis status post TAH/BSO Rectal cancer status post low anterior resection 08/2008-at that admission had prolonged colonic ileus Status post lysis of adhesions and ventral hernia repair 04/02/2009-last colonoscopy 10/09/2018 with 2 polyps removed Previous InterStim implant from urology  Admitted on 12/29/2018 right flank pain nausea vomiting-in setting of recent urinary tract infections over 9 weeks who was taking a lot of NSAIDs daily because of that Hemoglobin noted to drop 4 g from 2 days prior occult blood was negative but features concerning for bleed Protonix drip was started  Assessment & Plan: Multiple duodenal erosions EGD performed 11/17 as below Continue Protonix GTT IV for now liquid diet as per GI Pain control Dilaudid 1-2 every 4 as needed Acute blood loss anemia Hemoglobin up from admission 6.5 after 2 units PRBC to 8.4 Right recurrent pyelonephritis Was treated recently 3 times for pyelonephritis-urine culture growing less than 10,000 and received 3 days of IV ceftriaxone-stop antibiotics today DM TY 2 Blood sugars ranging from 98-1 70 Continue sliding scale coverage When taking p.o. resume gabapentin 300 twice daily Metformin 1000 twice daily Hypothyroidism Continue Synthroid IV 66 microgram History of rectal cancer status post low anterior resection 08/2008 Monitor trends ADHD, depression Continue Adderall 30 6 AM, 15 2 PM, Xanax 0.5 at bedtime anxiety Holding citalopram, cariprazine, doxepin at this time HTN Holding lisinopril 2.5 daily at this time  Subjective: Awake alert coherent no distress  Some nausea this morning no chest pain no fever no chills No dark stool no tarry stool EOMI NCAT no focal  deficit no icterus tolerating p.o. liquids Consultants:   Gastroenterology Procedures:   EGD 11/17 Impression:- Normal esophagus.            Erosive gastropathy with no bleeding and no             stigmata of recent bleeding. Biopsied.            Non-bleeding duodenal ulcers.            Non-bleeding duodenal ulcer with no stigmata bleeding. Cause of symptoms and recent bleeding. Antimicrobials:   ceftriaxione till 11/18  Objective: Vitals:   12/31/18 1859 12/31/18 2241 01/01/19 0619 01/01/19 1253  BP: (!) 142/64 (!) 145/70 (!) 141/74 136/68  Pulse: 73 61 66 73  Resp: 19 18 18 18   Temp: 99.1 F (37.3 C) 98.3 F (36.8 C) 97.8 F (36.6 C) 98.2 F (36.8 C)  TempSrc: Oral Oral Oral Oral  SpO2: 98% 98% 97% 98%  Weight:   86 kg   Height:        Intake/Output Summary (Last 24 hours) at 01/01/2019 1340 Last data filed at 12/31/2018 2106 Gross per 24 hour  Intake 240 ml  Output -  Net 240 ml   Filed Weights   12/31/18 0053 12/31/18 0838 01/01/19 0619  Weight: 86.4 kg 86.4 kg 86 kg    Examination: Flat affect S1-S2 no murmur rub or gallop Abdomen slightly tender in the epigastrium No lower extremity edema ROM intact neurologically intact  Data Reviewed: I have personally reviewed following labs and imaging studies  Scheduled Meds: . insulin aspart  0-9 Units Subcutaneous Q4H  . levothyroxine  66 mcg Intravenous Daily  . [START ON 01/02/2019] pantoprazole  40 mg Oral BID  .  potassium chloride  40 mEq Oral Daily   Continuous Infusions: . cefTRIAXone (ROCEPHIN)  IV 2 g (12/31/18 1804)  . pantoprozole (PROTONIX) infusion 8 mg/hr (12/31/18 1804)    Radiology Studies: Reviewed images personally in health database    LOS: 2 days   Time spent: [place], MD [facility]  01/01/2019, 1:40 PM

## 2019-01-01 NOTE — Progress Notes (Addendum)
Daily Rounding Note  01/01/2019, 9:23 AM  LOS: 2 days   SUBJECTIVE:   Chief complaint: RUQ abd pain, melena.    Still having the pain in the right upper quadrant.  It was problematic overnight as well as this morning.  She vomited this morning upon awakening, emesis was clear.  Currently tolerating clear liquids which are not causing increased symptoms.  No melenic stool.  OBJECTIVE:         Vital signs in last 24 hours:    Temp:  [96 F (35.6 C)-99.1 F (37.3 C)] 97.8 F (36.6 C) (11/18 0619) Pulse Rate:  [61-73] 66 (11/18 0619) Resp:  [10-19] 18 (11/18 0619) BP: (111-145)/(58-74) 141/74 (11/18 0619) SpO2:  [97 %-100 %] 97 % (11/18 0619) Weight:  [86 kg] 86 kg (11/18 0619) Last BM Date: 12/27/18 Filed Weights   12/31/18 0053 12/31/18 0838 01/01/19 0619  Weight: 86.4 kg 86.4 kg 86 kg   General: Patient looks well but depressed.  Comfortable. Heart: RRR. Chest: Clear bilaterally no labored breathing Abdomen: Soft.  Moderate tenderness right upper quadrant, no guarding or rebound.  Active bowel sounds Extremities: No CCE. Neuro/Psych: Alert.  Oriented x3.  Appropriate.  No gross deficits, no tremors.  Intake/Output from previous day: 11/17 0701 - 11/18 0700 In: 340 [P.O.:240; I.V.:100] Out: -   Intake/Output this shift: No intake/output data recorded.  Lab Results: Recent Labs    12/30/18 1314 12/31/18 0525 01/01/19 0354  WBC 10.0 8.0 8.5  HGB 8.2* 7.9* 8.4*  HCT 25.4* 23.9* 26.4*  PLT 419* 397 447*   BMET Recent Labs    12/30/18 0500 12/31/18 0525 01/01/19 0354  NA 140 141 140  K 3.6 3.6 3.2*  CL 113* 110 107  CO2 20* 19* 22  GLUCOSE 114* 113* 112*  BUN 29* 22* 17  CREATININE 1.10* 1.07* 1.03*  CALCIUM 8.0* 8.6* 8.7*   LFT Recent Labs    12/29/18 1717  PROT 6.2*  ALBUMIN 2.6*  AST 14*  ALT 12  ALKPHOS 62  BILITOT 0.3   PT/INR No results for input(s): LABPROT, INR in the last 72  hours. Hepatitis Panel No results for input(s): HEPBSAG, HCVAB, HEPAIGM, HEPBIGM in the last 72 hours.  Studies/Results: No results found.   Scheduled Meds: . insulin aspart  0-9 Units Subcutaneous Q4H  . levothyroxine  66 mcg Intravenous Daily  . [START ON 01/02/2019] pantoprazole  40 mg Intravenous Q12H  . potassium chloride  40 mEq Oral Daily   Continuous Infusions: . cefTRIAXone (ROCEPHIN)  IV 2 g (12/31/18 1804)  . pantoprozole (PROTONIX) infusion 8 mg/hr (12/31/18 1804)   PRN Meds:.acetaminophen **OR** acetaminophen, hydrALAZINE, HYDROmorphone (DILAUDID) injection, ondansetron **OR** ondansetron (ZOFRAN) IV   ASSESMENT:   *  RUQ abdominal pain, melena. Ibuprofen 600 mg bid for the last few months to deal with pain from recurrent UTIs.  Right upper quadrant pain only developed within the last couple of weeks. 12/29/2018 CTAP with contrast.  Inflammation and irregular mucosa in proximal duodenum s/o ulcer or diverticulum. 12/31/2018 EGD: Erosive gastropathy, no bleeding stigmata, biopsied.  Nonbleeding duodenal ulcers, likely cause of bleeding and symptoms. Protonix drip in place, 72 hours finishes tonight at 2230.    *   Acute blood loss anemia. Hgb 6.3 >> 2 PRBCs >> 8.4.  *    Azotemia, resolved.    *   ?pyelonephritis per CT?Marland Kitchen  Urine clx from 11/111 and 11/15 both w insignif growth.  Rocephin in place.    *   Remote resection of sigmoid colon cancer.  Up-to-date on surveillance colonoscopy performed 09/2018.   PLAN   *    Await pathology.    *   Begin Protonix 40 po bid tomorrow AM  *    Advised patient she will need to discontinue ibuprofen.  *   Advance diet full liquids.    Azucena Freed  01/01/2019, 9:23 AM Phone [phone]  I have discussed the case with the PA, and that is the plan I formulated. I personally interviewed and examined the patient.  Slow improvement, mainly pain ongoing.  Still tender, but non surgical abdomen.  No overt bleeding  since before admission.  Pain control, acid suppression, slow diet advancement as tolerated.   Hopefully home toward end of the week. Discussed case and plan with [facility] physician.  Total time 25 minutes.    Nelida Meuse III Office: [phone]

## 2019-01-02 DIAGNOSIS — K5900 Constipation, unspecified: Secondary | ICD-10-CM

## 2019-01-02 LAB — GLUCOSE, CAPILLARY
Glucose-Capillary: 131 mg/dL — ABNORMAL HIGH (ref 70–99)
Glucose-Capillary: 253 mg/dL — ABNORMAL HIGH (ref 70–99)
Glucose-Capillary: 159 mg/dL — ABNORMAL HIGH (ref 70–99)
Glucose-Capillary: 132 mg/dL — ABNORMAL HIGH (ref 70–99)

## 2019-01-02 LAB — CBC WITH DIFFERENTIAL/PLATELET
Abs Immature Granulocytes: 0.07 10*3/uL (ref 0.00–0.07)
Basophils Absolute: 0 10*3/uL (ref 0.0–0.1)
Basophils Relative: 0 %
Eosinophils Absolute: 0.1 10*3/uL (ref 0.0–0.5)
Eosinophils Relative: 1 %
HCT: 26.3 % — ABNORMAL LOW (ref 36.0–46.0)
Hemoglobin: 8.4 g/dL — ABNORMAL LOW (ref 12.0–15.0)
Immature Granulocytes: 1 %
Lymphocytes Relative: 19 %
Lymphs Abs: 1.7 10*3/uL (ref 0.7–4.0)
MCH: 29.4 pg (ref 26.0–34.0)
MCHC: 31.9 g/dL (ref 30.0–36.0)
MCV: 92 fL (ref 80.0–100.0)
Monocytes Absolute: 0.4 10*3/uL (ref 0.1–1.0)
Monocytes Relative: 4 %
Neutro Abs: 6.9 10*3/uL (ref 1.7–7.7)
Neutrophils Relative %: 75 %
Platelets: 465 10*3/uL — ABNORMAL HIGH (ref 150–400)
RBC: 2.86 MIL/uL — ABNORMAL LOW (ref 3.87–5.11)
RDW: 16.8 % — ABNORMAL HIGH (ref 11.5–15.5)
WBC: 9.2 10*3/uL (ref 4.0–10.5)
nRBC: 0 % (ref 0.0–0.2)

## 2019-01-02 LAB — BASIC METABOLIC PANEL
Anion gap: 14 (ref 5–15)
BUN: 13 mg/dL (ref 6–20)
CO2: 21 mmol/L — ABNORMAL LOW (ref 22–32)
Calcium: 8.7 mg/dL — ABNORMAL LOW (ref 8.9–10.3)
Chloride: 104 mmol/L (ref 98–111)
Creatinine, Ser: 1.02 mg/dL — ABNORMAL HIGH (ref 0.44–1.00)
GFR calc Af Amer: >60 mL/min (ref >60)
GFR calc non Af Amer: >60 mL/min (ref >60)
Glucose, Bld: 137 mg/dL — ABNORMAL HIGH (ref 70–99)
Potassium: 3.3 mmol/L — ABNORMAL LOW (ref 3.5–5.1)
Sodium: 139 mmol/L (ref 135–145)

## 2019-01-02 MED ORDER — AMPHETAMINE-DEXTROAMPHET ER 30 MG PO CP24: 30.0000 mg | ORAL_CAPSULE | Freq: Every day | ORAL | Status: DC

## 2019-01-02 MED ORDER — AMPHETAMINE-DEXTROAMPHET ER 10 MG PO CP24
30.0000 mg | ORAL_CAPSULE | Freq: Every day | ORAL | Status: DC
  Administered 2019-01-03: 30 mg via ORAL
  Filled 2019-01-02: qty 3
  Filled 2019-01-02: qty 1

## 2019-01-02 MED ORDER — CITALOPRAM HYDROBROMIDE 40 MG PO TABS
40.0000 mg | ORAL_TABLET | Freq: Every day | ORAL | Status: DC
  Administered 2019-01-02: 40 mg via ORAL
  Filled 2019-01-02: qty 1

## 2019-01-02 MED ORDER — GABAPENTIN 300 MG PO CAPS
300.0000 mg | ORAL_CAPSULE | Freq: Two times a day (BID) | ORAL | Status: DC
  Administered 2019-01-02 – 2019-01-03 (×3): 300 mg via ORAL
  Filled 2019-01-02 (×3): qty 1

## 2019-01-02 MED ORDER — SODIUM CHLORIDE 0.9 % IV SOLN
510.0000 mg | Freq: Once | INTRAVENOUS | Status: AC
  Administered 2019-01-02: 510 mg via INTRAVENOUS
  Filled 2019-01-02: qty 17

## 2019-01-02 MED ORDER — HYDROMORPHONE HCL 1 MG/ML IJ SOLN: 1.0000 mg | INTRAMUSCULAR | Status: DC | PRN

## 2019-01-02 MED ORDER — METFORMIN HCL 500 MG PO TABS
1000.0000 mg | ORAL_TABLET | Freq: Two times a day (BID) | ORAL | Status: DC
  Administered 2019-01-02 – 2019-01-03 (×2): 1000 mg via ORAL
  Filled 2019-01-02 (×2): qty 2

## 2019-01-02 MED ORDER — LEVOTHYROXINE SODIUM 112 MCG PO TABS
112.0000 ug | ORAL_TABLET | Freq: Every day | ORAL | Status: DC
  Administered 2019-01-03: 112 ug via ORAL
  Filled 2019-01-02: qty 1

## 2019-01-02 MED ORDER — MAGNESIUM HYDROXIDE 400 MG/5ML PO SUSP
30.0000 mL | Freq: Once | ORAL | Status: AC
  Administered 2019-01-02: 30 mL via ORAL
  Filled 2019-01-02: qty 30

## 2019-01-02 MED ORDER — CARIPRAZINE HCL 1.5 MG PO CAPS
3.0000 mg | ORAL_CAPSULE | ORAL | Status: DC
  Administered 2019-01-02: 23:00:00 3 mg via ORAL
  Filled 2019-01-02: qty 2
  Filled 2019-01-02: qty 1

## 2019-01-02 MED ORDER — DOXEPIN HCL 50 MG PO CAPS
50.0000 mg | ORAL_CAPSULE | Freq: Every day | ORAL | Status: DC
  Administered 2019-01-02: 50 mg via ORAL
  Filled 2019-01-02: qty 1

## 2019-01-02 MED ORDER — AMPHETAMINE-DEXTROAMPHETAMINE 5 MG PO TABS
15.0000 mg | ORAL_TABLET | Freq: Every day | ORAL | Status: DC
  Administered 2019-01-02: 15 mg via ORAL
  Filled 2019-01-02: qty 3

## 2019-01-02 NOTE — Progress Notes (Addendum)
Daily Rounding Note  01/02/2019, 11:23 AM  LOS: 3 days   SUBJECTIVE:   Chief complaint: GI bleed with melena.  Erosive gastropathy, nonbleeding duodenal ulcers.  RUQ pain    Used 4 mg of Dilaudid yesterday, 1 mg so far today. RUQ pain letter.  No further nausea.  Has not had a bowel movement since she was admitted.  They were dark PTA.  Normal stool pattern is to not have bowel movements for a couple of days and then to have loose stools for a few days.  This has been her pattern since her colon surgery   OBJECTIVE:         Vital signs in last 24 hours:    Temp:  [97.6 F (36.4 C)-98.5 F (36.9 C)] 98.2 F (36.8 C) (11/19 0438) Pulse Rate:  [61-73] 63 (11/19 0438) Resp:  [16-18] 18 (11/19 0438) BP: (124-145)/(63-78) 145/72 (11/19 0438) SpO2:  [96 %-99 %] 97 % (11/19 0438) Weight:  [85.9 kg] 85.9 kg (11/19 0235) Last BM Date: 12/27/18 Filed Weights   12/31/18 0838 01/01/19 0619 01/02/19 0235  Weight: 86.4 kg 86 kg 85.9 kg   General: More animated today, less depressed.  Alert, comfortable. Heart: RRR. Chest: Clear bilaterally.  No labored breathing or cough. Abdomen: Soft.  Minimal if any tenderness in the right upper quadrant.  No flank tenderness.  Active bowel sounds. Extremities: No CCE. Neuro/Psych: Fluid speech.  Alert and oriented x3.  Less depressed than yesterday.  Intake/Output from previous day: No intake/output data recorded.  Intake/Output this shift: No intake/output data recorded.  Lab Results: Recent Labs    12/31/18 0525 01/01/19 0354 01/02/19 0523  WBC 8.0 8.5 9.2  HGB 7.9* 8.4* 8.4*  HCT 23.9* 26.4* 26.3*  PLT 397 447* 465*   BMET Recent Labs    12/31/18 0525 01/01/19 0354 01/02/19 0523  NA 141 140 139  K 3.6 3.2* 3.3*  CL 110 107 104  CO2 19* 22 21*  GLUCOSE 113* 112* 137*  BUN 22* 17 13  CREATININE 1.07* 1.03* 1.02*  CALCIUM 8.6* 8.7* 8.7*   Scheduled Meds: . [START ON  01/03/2019] amphetamine-dextroamphetamine  30 mg Oral Q0600  . amphetamine-dextroamphetamine  15 mg Oral Q1400  . cariprazine  3 mg Oral Q48H  . citalopram  40 mg Oral QHS  . doxepin  50 mg Oral QHS  . gabapentin  300 mg Oral BID  . insulin aspart  0-9 Units Subcutaneous Q4H  . [START ON 01/03/2019] levothyroxine  112 mcg Oral QAC breakfast  . magnesium hydroxide  30 mL Oral Once  . metFORMIN  1,000 mg Oral BID WC  . pantoprazole  40 mg Oral BID  . potassium chloride  40 mEq Oral Daily   Continuous Infusions: PRN Meds:.acetaminophen **OR** acetaminophen, hydrALAZINE, HYDROmorphone (DILAUDID) injection, ondansetron **OR** ondansetron (ZOFRAN) IV   ASSESMENT:   *  RUQ abdominal pain, melena. Ibuprofen 600 mg bid PTA UTIs.  Right upper quadrant pain only developed within the last couple of weeks. 12/29/2018 CTAP w contrast.  Inflammation and irregular mucosa in proximal duodenum s/o ulcer or diverticulum. 12/31/2018 EGD: Erosive gastropathy, no bleeding stigmata.  Nonbleeding duodenal ulcers, likely cause of bleeding and symptoms.  Path: gastric antral and oxyntic mucosa with no specific histopathologic changes.  No H. Pylori. Protonix drip in place, 72 hours finishes tonight at 2230.    *   Acute blood loss anemia. Hgb 6.3 >> 2 PRBCs >> 8.4.  *  Azotemia, resolved.    *   ?pyelonephritis per CT?Marland Kitchen  Urine clx from 11/111 and 11/15 both w insignif growth.    Finished 3 days of Rocephin on 11/18.   *   Remote resection of sigmoid colon cancer.  Up-to-date on surveillance colonoscopy performed 09/2018.   *   DM 2.  Oral agent at home   PLAN   *   Advance to carbohydrate modified diet.   Recommend decelerating narcotics, substituting with acetaminophen?  *   1 dose milk of magnesia now.    Azucena Freed  01/02/2019, 11:23 AM Phone [phone]  I have discussed the case with the PA, and that is the plan I formulated.   She is improved and nearing ready for discharge.  Need to get bowels moving from opioids.  Advancing diet Hgb stable - no bleeding. Ordered a dose of IV iron   Nelida Meuse III Office: [phone]

## 2019-01-02 NOTE — Progress Notes (Signed)
Hospitalist progress note  If 7PM-7AM,  night-coverage-look on AMION -prefer pages-not epic sathvika, takayama Lehnen OT:[id number] DOB: 05-14-1960 DOA: 12/29/2018  PCP: Abner Greenspan, MD   Narrative:  58 year old white female  known prior history of severe endometriosis status post TAH/BSO Rectal cancer status post low anterior resection 08/2008-at that admission had prolonged colonic ileus Status post lysis of adhesions and ventral hernia repair 04/02/2009-last colonoscopy 10/09/2018 with 2 polyps removed Previous InterStim implant from urology  Admitted on 12/29/2018 right flank pain nausea vomiting-in setting of recent urinary tract infections over 9 weeks who was taking a lot of NSAIDs daily because of that Hemoglobin noted to drop 4 g from 2 days prior occult blood was negative but features concerning for bleed Protonix drip was started  Assessment & Plan: Multiple duodenal erosions EGD performed 11/17 as below Protonix GTT transition to p.o. twice daily Protonix 40 Pain control Dilaudid 1-2 every 4 as needed Acute blood loss anemia Hemoglobin up from admission 6.5 after 2 units PRBC to 8.4 and has been stable at that level for the past 24 hours-no further work-up Right recurrent pyelonephritis Was treated recently 3 times for pyelonephritis-urine culture growing less than 10,000 and received 3 days of IV ceftriaxone-stop antibiotics today DM TY 2 Blood sugars 1 25-1 37 Continue sliding scale coverage resume gabapentin 300 twice daily Metformin 1000 twice daily Hypothyroidism Continue Synthroid IV 66 microgram History of rectal cancer status post low anterior resection 08/2008 Monitor trends ADHD, depression Continue Adderall 30 6 AM, 15 2 PM, Xanax 0.5 at bedtime anxiety Resume cariprazine 3 mg every other day Xanax 0.5 as needed at bedtime anxiety citalopram 40 at bedtime doxepin 50 at bedtime HTN Holding lisinopril 2.5 daily at this time  Subjective: Awake coherent  tolerating diet no fever no chills no nausea no vomiting no chest pain no cough no cold no dark or tarry stool  Consultants:   Gastroenterology Procedures:   EGD 11/17 Impression:- Normal esophagus.            Erosive gastropathy with no bleeding and no             stigmata of recent bleeding. Biopsied.            Non-bleeding duodenal ulcers.            Non-bleeding duodenal ulcer with no stigmata bleeding. Cause of symptoms and recent bleeding. Antimicrobials:   ceftriaxione till 11/18  Objective: Vitals:   01/01/19 1754 01/01/19 2204 01/02/19 0235 01/02/19 0438  BP: 138/78 124/63  (!) 145/72  Pulse: 62 61  63  Resp: 16 17  18   Temp: 97.6 F (36.4 C) 98.5 F (36.9 C)  98.2 F (36.8 C)  TempSrc: Oral Oral  Oral  SpO2: 99% 96%  97%  Weight:   85.9 kg   Height:       No intake or output data in the 24 hours ending 01/02/19 1034 Filed Weights   12/31/18 0838 01/01/19 0619 01/02/19 0235  Weight: 86.4 kg 86 kg 85.9 kg    Examination: Flat affect S1-S2 no murmur Abdomen soft nontender Chest clear Neurologically intact  Data Reviewed: I have personally reviewed following labs and imaging studies  Scheduled Meds: . amphetamine-dextroamphetamine  30 mg Oral Q0600  . amphetamine-dextroamphetamine  15 mg Oral Q1400  . cariprazine  3 mg Oral See admin instructions  . citalopram  40 mg Oral QHS  . doxepin  50 mg Oral QHS  . gabapentin  300 mg  Oral BID  . insulin aspart  0-9 Units Subcutaneous Q4H  . [START ON 01/03/2019] levothyroxine  112 mcg Oral QAC breakfast  . levothyroxine  66 mcg Intravenous Daily  . metFORMIN  1,000 mg Oral BID WC  . pantoprazole  40 mg Oral BID  . potassium chloride  40 mEq Oral Daily   Continuous Infusions: . cefTRIAXone (ROCEPHIN)  IV 2 g (01/01/19 1534)    Radiology Studies: Reviewed images personally in health database    LOS: 3 days   Time spent: [place], MD [facility]  01/02/2019, 10:34 AM

## 2019-01-03 ENCOUNTER — Ambulatory Visit: Payer: BC Managed Care – PPO | Admitting: Family Medicine

## 2019-01-03 ENCOUNTER — Encounter: Payer: Self-pay | Admitting: Family Medicine

## 2019-01-03 LAB — CBC WITH DIFFERENTIAL/PLATELET
Abs Immature Granulocytes: 0.15 10*3/uL — ABNORMAL HIGH (ref 0.00–0.07)
Basophils Absolute: 0 10*3/uL (ref 0.0–0.1)
Basophils Relative: 0 %
Eosinophils Absolute: 0.1 10*3/uL (ref 0.0–0.5)
Eosinophils Relative: 1 %
HCT: 26.5 % — ABNORMAL LOW (ref 36.0–46.0)
Hemoglobin: 8.5 g/dL — ABNORMAL LOW (ref 12.0–15.0)
Immature Granulocytes: 2 %
Lymphocytes Relative: 29 %
Lymphs Abs: 2.7 10*3/uL (ref 0.7–4.0)
MCH: 29.5 pg (ref 26.0–34.0)
MCHC: 32.1 g/dL (ref 30.0–36.0)
MCV: 92 fL (ref 80.0–100.0)
Monocytes Absolute: 0.4 10*3/uL (ref 0.1–1.0)
Monocytes Relative: 5 %
Neutro Abs: 5.8 10*3/uL (ref 1.7–7.7)
Neutrophils Relative %: 63 %
Platelets: 491 10*3/uL — ABNORMAL HIGH (ref 150–400)
RBC: 2.88 MIL/uL — ABNORMAL LOW (ref 3.87–5.11)
RDW: 16.9 % — ABNORMAL HIGH (ref 11.5–15.5)
WBC: 9.3 10*3/uL (ref 4.0–10.5)
nRBC: 0 % (ref 0.0–0.2)

## 2019-01-03 LAB — BASIC METABOLIC PANEL
Anion gap: 13 (ref 5–15)
BUN: 16 mg/dL (ref 6–20)
CO2: 20 mmol/L — ABNORMAL LOW (ref 22–32)
Calcium: 8.6 mg/dL — ABNORMAL LOW (ref 8.9–10.3)
Chloride: 105 mmol/L (ref 98–111)
Creatinine, Ser: 1.34 mg/dL — ABNORMAL HIGH (ref 0.44–1.00)
GFR calc Af Amer: 51 mL/min — ABNORMAL LOW (ref >60)
GFR calc non Af Amer: 44 mL/min — ABNORMAL LOW (ref >60)
Glucose, Bld: 134 mg/dL — ABNORMAL HIGH (ref 70–99)
Potassium: 3.8 mmol/L (ref 3.5–5.1)
Sodium: 138 mmol/L (ref 135–145)

## 2019-01-03 LAB — CULTURE, BLOOD (ROUTINE X 2)
Culture: NO GROWTH
Culture: NO GROWTH
Special Requests: ADEQUATE

## 2019-01-03 LAB — GLUCOSE, CAPILLARY
Glucose-Capillary: 151 mg/dL — ABNORMAL HIGH (ref 70–99)
Glucose-Capillary: 156 mg/dL — ABNORMAL HIGH (ref 70–99)
Glucose-Capillary: 253 mg/dL — ABNORMAL HIGH (ref 70–99)

## 2019-01-03 MED ORDER — PANTOPRAZOLE SODIUM 40 MG PO TBEC: DELAYED_RELEASE_TABLET | ORAL | 0 refills | Status: DC

## 2019-01-03 NOTE — Progress Notes (Signed)
Kelli Cruz to be D/C'd Home per MD order.  Discussed with the patient and all questions fully answered.   VSS, Skin clean, dry and intact without evidence of skin break down, no evidence of skin tears noted. IV catheter discontinued intact. Site without signs and symptoms of complications. Dressing and pressure applied.   An After Visit Summary was printed and given to the patient.    D/C education completed with patient/family including follow up instructions, medication list, d/c activities limitations if indicated, with other d/c instructions as indicated by MD - patient able to verbalize understanding, all questions fully answered.    Patient instructed to return to ED, call 911, or call MD for any changes in condition.    Patient escorted via [place], and D/C home via car.

## 2019-01-03 NOTE — Discharge Instructions (Signed)
Peptic Ulcer  A peptic ulcer is a sore in the lining of the stomach (gastric ulcer) or the first part of the small intestine (duodenal ulcer). The ulcer causes a gradual wearing away (erosion) of the deeper tissue. What are the causes? Normally, the lining of the stomach and the small intestine protects them from the acid that digests food. The protective lining can be damaged by:  An infection caused by a type of bacteria called Helicobacter pylori or H. pylori.  Regular use of NSAIDs, such as ibuprofen or aspirin.  Rare tumors in the stomach, small intestine, or pancreas (Zollinger-Ellison syndrome). What increases the risk? The following factors may make you more likely to develop this condition:  Smoking.  Having a family history of ulcer disease.  Drinking alcohol.  Having been hospitalized in an intensive care unit (ICU). What are the signs or symptoms? Symptoms of this condition include:  Persistent burning pain in the area between the chest and the belly button. The pain may be worse on an empty stomach and at night.  Heartburn.  Nausea and vomiting.  Bloating. If the ulcer results in bleeding, it can cause:  Black, tarry stools.  Vomiting of bright red blood.  Vomiting of material that looks like coffee grounds. How is this diagnosed? This condition may be diagnosed based on:  Your medical history and a physical exam.  Various tests or procedures, such as: ? Blood tests, stool tests, or breath tests to check for the H. pylori bacteria. ? An X-ray exam (upper gastrointestinal series) of the esophagus, stomach, and small intestine. ? Upper endoscopy. The health care provider examines the esophagus, stomach, and small intestine using a small flexible tube that has a video camera at the end. ? Biopsy. A tissue sample is removed to be examined under a microscope. How is this treated? Treatment for this condition may include:  Eliminating the cause of the ulcer,  such as smoking or use of NSAIDs, and limiting alcohol and caffeine intake.  Medicines to reduce the amount of acid in your digestive tract.  Antibiotic medicines, if the ulcer is caused by an H. pylori infection.  An upper endoscopy may be used to treat a bleeding ulcer.  Surgery. This may be needed if the bleeding is severe or if the ulcer created a hole somewhere in the digestive system. Follow these instructions at home:  Do not drink alcohol if your health care provider tells you not to drink.  Do not use any products that contain nicotine or tobacco, such as cigarettes, e-cigarettes, and chewing tobacco. If you need help quitting, ask your health care provider.  Take over-the-counter and prescription medicines only as told by your health care provider. ? Do not use over-the-counter medicines in place of prescription medicines unless your health care provider approves. ? Do not take aspirin, ibuprofen, or other NSAIDs unless your health care provider told you to do so.  Take over-the-counter and prescription medicines only as told by your health care provider.  Keep all follow-up visits as told by your health care provider. This is important. Contact a health care provider if:  Your symptoms do not improve within 7 days of starting treatment.  You have ongoing indigestion or heartburn. Get help right away if:  You have sudden, sharp, or persistent pain in your abdomen.  You have bloody or dark black, tarry stools.  You vomit blood or material that looks like coffee grounds.  You become light-headed or you feel faint.  You become weak.  You become sweaty or clammy. Summary  A peptic ulcer is a sore in the lining of the stomach (gastric ulcer) or the first part of the small intestine (duodenal ulcer). The ulcer causes a gradual wearing away (erosion) of the deeper tissue.  Do not use any products that contain nicotine or tobacco, such as cigarettes, e-cigarettes, and  chewing tobacco. If you need help quitting, ask your health care provider.  Take over-the-counter and prescription medicines only as told by your health care provider. Do not use over-the-counter medicines in place of prescription medicines unless your health care provider approves.  Contact your health care provider if you have ongoing indigestion or heartburn.  Keep all follow-up visits as told by your health care provider. This is important. This information is not intended to replace advice given to you by your health care provider. Make sure you discuss any questions you have with your health care provider. Document Released: 01/28/2000 Document Revised: 08/07/2017 Document Reviewed: 08/07/2017 Else[employer] Patient Education  [place].   Pyelonephritis, Adult  Pyelonephritis is an infection that occurs in the kidney. The kidneys are organs that help clean the blood by moving waste out of the blood and into the pee (urine). This infection can happen quickly, or it can last for a long time. In most cases, it clears up with treatment and does not cause other problems. What are the causes? This condition may be caused by:  Germs (bacteria) going from the bladder up to the kidney. This may happen after having a bladder infection.  Germs going from the blood to the kidney. What increases the risk? This condition is more likely to develop in:  Pregnant women.  Older people.  People who have any of these conditions: ? Diabetes. ? Inflammation of the prostate gland (prostatitis), in males. ? Kidney stones or bladder stones. ? Other problems with the kidney or the parts of your body that carry pee from the kidneys to the bladder (ureters). ? Cancer.  People who have a small, thin tube (catheter) placed in the bladder.  People who are sexually active.  Women who use a medicine that kills sperm (spermicide) to prevent pregnancy.  People who have had a prior urinary tract  infection (UTI). What are the signs or symptoms? Symptoms of this condition include:  Peeing often.  A strong urge to pee right away.  Burning or stinging when peeing.  Belly pain.  Back pain.  Pain in the side (flank area).  Fever or chills.  Blood in the pee, or dark pee.  Feeling sick to your stomach (nauseous) or throwing up (vomiting). How is this treated? This condition may be treated by:  Taking antibiotic medicines by mouth (orally).  Drinking enough fluids. If the infection is bad, you may need to stay in the hospital. You may be given antibiotics and fluids that are put directly into a vein through an IV tube. In some cases, other treatments may be needed. Follow these instructions at home: Medicines  Take your antibiotic medicine as told by your doctor. Do not stop taking the antibiotic even if you start to feel better.  Take over-the-counter and prescription medicines only as told by your doctor. General instructions   Drink enough fluid to keep your pee pale yellow.  Avoid caffeine, tea, and carbonated drinks.  Pee (urinate) often. Avoid holding in pee for long periods of time.  Pee before and after sex.  After pooping (having a bowel  movement), women should wipe from front to back. Use each tissue only once.  Keep all follow-up visits as told by your doctor. This is important. Contact a doctor if:  You do not feel better after 2 days.  Your symptoms get worse.  You have a fever. Get help right away if:  You cannot take your medicine or drink fluids as told.  You have chills and shaking.  You throw up.  You have very bad pain in your side or back.  You feel very weak or you pass out (faint). Summary  Pyelonephritis is an infection that occurs in the kidney.  In most cases, this infection clears up with treatment and does not cause other problems.  Take your antibiotic medicine as told by your doctor. Do not stop taking the  antibiotic even if you start to feel better.  Drink enough fluid to keep your pee pale yellow. This information is not intended to replace advice given to you by your health care provider. Make sure you discuss any questions you have with your health care provider. Document Released: 03/09/2004 Document Revised: 12/04/2017 Document Reviewed: 12/04/2017 Elsevier Patient Education  2020 [employer].

## 2019-01-03 NOTE — Discharge Summary (Signed)
Physician Discharge Summary  Kelli Cruz KYH:[id number] DOB: March 15, 1960 DOA: 12/29/2018  PCP: Abner Greenspan, MD  Admit date: 12/29/2018 Discharge date: 01/03/2019  Time spent: 50 minutes  Recommendations for Outpatient Follow-up:  1. Needs Protonix 40 twice daily for 1 month and then back down to 40 mg daily prescription given 2. Counseled not to use NSAIDs 3. Needs CBC and Chem-12 in about 1 week 4. Recommend outpatient follow-up with Dr. Tarri Glenn  Discharge Diagnoses:  Principal Problem:   Acute GI bleeding Active Problems:   ADENOCARCINOMA, SIGMOID COLON   Hypothyroidism   Diabetes type 2, controlled ([place])   B12 deficiency   Acute pyelonephritis   Nausea & vomiting   Discharge Condition: Improved  Diet recommendation: Soft graduating to full diet  Filed Weights   12/31/18 0838 01/01/19 0619 01/02/19 0235  Weight: 86.4 kg 86 kg 85.9 kg    History of present illness:  58 year old white female  known prior history of severe endometriosis status post TAH/BSO Rectal cancer status post low anterior resection 08/2008-at that admission had prolonged colonic ileus Status post lysis of adhesions and ventral hernia repair 04/02/2009-last colonoscopy 10/09/2018 with 2 polyps removed Previous InterStim implant from urology  Admitted on 12/29/2018 right flank pain nausea vomiting-in setting of recent urinary tract infections over 9 weeks who was taking a lot of NSAIDs daily because of that Hemoglobin noted to drop 4 g from 2 days prior occult blood was negative but features concerning for bleed Protonix drip was started  Hospital Course:  Multiple duodenal erosions EGD performed 11/17 as below Protonix GTT transition to p.o. twice daily Protonix 40 for at least a month and then transition to 40 daily Acute blood loss anemia Hemoglobin up from admission 6.5 after 2 units PRBC to 8.4 and has been stable at that level for the past 24 hours-given IV iron although unclear if had  low iron stores Outpatient follow-up Right recurrent pyelonephritis Was treated recently 3 times for pyelonephritis-urine culture growing less than 10,000 and received 3 days of IV ceftriaxone-s stopped antibiotics afebrile DM TY 2 Blood sugars  ranging between 130 and 230 resume gabapentin 300 twice daily Metformin 1000 twice daily on discharge home Hypothyroidism Continue Synthroid IV 66 microgram History of rectal cancer status post low anterior resection 08/2008 Monitor trends ADHD, depression Continue Adderall 30 6 AM, 15 2 PM, Xanax 0.5 at bedtime anxiety Resume cariprazine 3 mg every other day Xanax 0.5 as needed at bedtime anxiety citalopram 40 at bedtime doxepin 50 at bedtime HTN Holding lisinopril 2.5 daily at this time but this was restarted on discharge  Procedures:  Endoscopy 11/17  Consultations:  GI  Discharge Exam: Vitals:   01/03/19 0418 01/03/19 0608  BP: 128/70 (!) 124/55  Pulse: 80 86  Resp: 20 16  Temp: 98.7 F (37.1 C) 98.2 F (36.8 C)  SpO2: 94% 96%    General: EOMI NCAT no focal deficit neck soft supple no icterus no pallor chest clear no added sound S1-S2 no murmur rub or gallop Abdomen soft no epigastric tenderness No lower extremity edema   Discharge Instructions   Discharge Instructions    Diet - low sodium heart healthy   Complete by: As directed    Discharge instructions   Complete by: As directed    Please do not take any further nonsteroidals or other over-the-counter meds other than Tylenol for pain or fever Would recommend outpatient follow-up with Dr. Tarri Glenn in the outpatient setting in 4 to 6 weeks note  that you should be on Protonix twice a day at least for a month and then go down to once a day We we will let your regular doctor refill the rest of your medications in the outpatient setting and please follow-up with your psychiatrist continue soft diet for the next 45 days and then you can graduate to a regular diet   Increase  activity slowly   Complete by: As directed      Allergies as of 01/03/2019      Reactions   Hydrocodone Nausea And Vomiting   No reaction if taken with enough food   Keflex [cephalexin] Nausea And Vomiting, Other (See Comments)   Light-headed/dizziness/weakness   Mirabegron Other (See Comments)   Trouble breathing and swollen tongue   Milk-related Compounds Nausea And Vomiting      Medication List    STOP taking these medications   cefdinir 300 MG capsule Commonly known as: OMNICEF   ibuprofen 200 MG tablet Commonly known as: ADVIL   oxyCODONE-acetaminophen 5-325 MG tablet Commonly known as: PERCOCET/ROXICET     TAKE these medications   ALPRAZolam 0.5 MG tablet Commonly known as: XANAX Take 0.5 mg by mouth at bedtime as needed for anxiety (panic attack).   amphetamine-dextroamphetamine 30 MG 24 hr capsule Commonly known as: ADDERALL XR Take 30 mg by mouth daily at 6 (six) AM.   amphetamine-dextroamphetamine 15 MG tablet Commonly known as: ADDERALL Take 15 mg by mouth daily at 2 PM.   atorvastatin 10 MG tablet Commonly known as: LIPITOR Take 1 tablet (10 mg total) by mouth daily. What changed: when to take this   citalopram 40 MG tablet Commonly known as: CELEXA Take 40 mg by mouth at bedtime.   Vitamin B-12 5000 MCG Tbdp Take 5,000 mcg by mouth daily. What changed: Another medication with the same name was changed. Make sure you understand how and when to take each.   cyanocobalamin 1000 MCG/ML injection Commonly known as: (VITAMIN B-12) Inject 1 mL (1,000 mcg total) into the muscle every 3 (three) months. every 8 weeks What changed:   when to take this  additional instructions   Diabetic Sterile Lancets Misc by Does not apply route. Check glucose two times a day and as needed for out of control DM 250.00   LIFESCAN FINEPOINT LANCETS Misc Lancets and device for one touch verio flex meter. Use daily. DX 11.9   doxepin 50 MG capsule Commonly known  as: SINEQUAN Take 50 mg by mouth at bedtime.   gabapentin 300 MG capsule Commonly known as: NEURONTIN Take 1 capsule (300 mg total) by mouth 2 (two) times daily.   glipiZIDE 5 MG 24 hr tablet Commonly known as: GLUCOTROL XL Take 1 tablet (5 mg total) by mouth daily.   glucose blood test strip 1 each by Other route. Check glucose two times a day and as needed for out of control DM 250.00   OneTouch Verio test strip Generic drug: glucose blood Strips for one touch verio flex meter. Check sugar twice daily DX E11.9   levothyroxine 112 MCG tablet Commonly known as: SYNTHROID Take 1 tablet (112 mcg total) by mouth daily. What changed: when to take this   lisinopril 5 MG tablet Commonly known as: ZESTRIL Take 0.5 tablets (2.5 mg total) by mouth daily. What changed: when to take this   metFORMIN 1000 MG tablet Commonly known as: GLUCOPHAGE Take 1 tablet (1,000 mg total) by mouth 2 (two) times daily with a meal.   ondansetron  4 MG disintegrating tablet Commonly known as: Zofran ODT Take 1 tablet (4 mg total) by mouth every 8 (eight) hours as needed for nausea or vomiting.   OneTouch Verio Flex System w/Device Kit Check sugar twice daily and as needed. DX 11.9   pantoprazole 40 MG tablet Commonly known as: PROTONIX Take 1 tablet (40 mg total) by mouth 2 (two) times daily for 30 days, THEN 1 tablet (40 mg total) daily. Start taking on: January 03, 2019   Vitamin D3 125 MCG (5000 UT) Caps Take 5,000 Units by mouth daily.   Vraylar capsule Generic drug: cariprazine Take 3 mg by mouth See admin instructions. Take one capsule (3 mg) by mouth every other night      Allergies  Allergen Reactions  . Hydrocodone Nausea And Vomiting    No reaction if taken with enough food  . Keflex [Cephalexin] Nausea And Vomiting and Other (See Comments)    Light-headed/dizziness/weakness  . Mirabegron Other (See Comments)    Trouble breathing and swollen tongue  . Milk-Related Compounds  Nausea And Vomiting   Follow-up Information    Thornton Park, MD Follow up on 02/06/2019.   Specialty: Gastroenterology Why: 10:30 AM follow up ulcers appt.   Contact information: [place] [address] [phone]            The results of significant diagnostics from this hospitalization (including imaging, microbiology, ancillary and laboratory) are listed below for reference.    Significant Diagnostic Studies: Ct Abdomen Pelvis W Contrast  Addendum Date: 12/29/2018   ADDENDUM REPORT: 12/29/2018 21:17 ADDENDUM: Study discussed by telephone with PA-C [place] on 12/29/2018 at 2109 hours. Given the setting of new anemia, we discussed that I favor a bleeding duodenal ulcer. Electronically Signed   By: Genevie Ann M.D.   On: 12/29/2018 21:17   Result Date: 12/29/2018 CLINICAL DATA:  58 year old female with right mid abdomen pain radiating to the flank. History of treated colon cancer in 2010. New anemia. EXAM: CT ABDOMEN AND PELVIS WITH CONTRAST TECHNIQUE: Multidetector CT imaging of the abdomen and pelvis was performed using the standard protocol following bolus administration of intravenous contrast. CONTRAST:  112m OMNIPAQUE IOHEXOL 300 MG/ML  SOLN COMPARISON:  CT Abdomen and Pelvis 12/26/2018. FINDINGS: Lower chest: Stable mild lung base scarring. Mildly increased lung volumes and less lung base atelectasis. No pericardial or pleural effusion. Hepatobiliary: Liver enhancement remains normal. The gallbladder is larger since 12/26/2018, but not definitely inflamed. No bile duct enlargement identified. Pancreas: The pancreas appears stable despite continued inflammation in the 2nd portion of the duodenum. No pancreatic ductal dilatation. Spleen: Stable, negative. Adrenals/Urinary Tract: Normal adrenal glands. Left renal enhancement and contrast excretion remains within normal limits. Decompressed proximal left ureter. Heterogeneous enhancement of the right kidney  persists. Multifocal right nephrolithiasis with calculi up to 6 millimeters in the pelvis and lower pole collecting system. Right periureteral stranding without right hydroureter. Diminutive and unremarkable urinary bladder. On the delayed images right renal enhancement remains heterogeneous. Stomach/Bowel: There is a rectal anastomosis with no adverse features on series 3, image 84. Upstream large bowel with mild gas and some retained stool mixed with oral contrast. Greater volume of retained stool from the splenic flexure proximally. Normal appendix containing oral contrast on series 3, image 71. No large bowel inflammation. Negative terminal ileum. No dilated small bowel. No free air. The proximal stomach appears normal. But there is inflammation of the proximal duodenum maximal in the 2nd portion which appears thick walled  with surrounding stranding (series 3, image 35). The lumen does appear irregular in this region such as on series 3, image 32. No extraluminal gas or collection identified. The distal duodenum remains normal. Vascular/Lymphatic: Major arterial structures are patent with mild aortic atherosclerosis. Portal venous system is patent. No lymphadenopathy. Reproductive: Surgically absent. Other: No pelvic free fluid.  Stable mild presacral stranding. Musculoskeletal: Stable visualized osseous structures. There is a right S3 sacral nerve stimulator device with generator along the left flank, unchanged. IMPRESSION: 1. Continued inflammation of the proximal Duodenum which appears thick-walled and with luminal irregularity suggesting diverticulum or ulcer. No interval perforation is identified.  No abscess. As before, infectious duodenitis or inflamed ulcer disease is favored. EGD likely would be most valuable. 2. Continued abnormal enhancement of the right kidney with right renal and ureter inflammation compatible with Ascending Infection/pyelonephritis. No renal abscess. There is superimposed right  nephrolithiasis, but no obstructive uropathy suspected. 3. Stable abdomen and pelvis otherwise. Electronically Signed: By: Genevie Ann M.D. On: 12/29/2018 20:23   Ct Abdomen Pelvis W Contrast  Result Date: 12/26/2018 CLINICAL DATA:  Acute abdominal pain, history of colon carcinoma and recent UTI EXAM: CT ABDOMEN AND PELVIS WITH CONTRAST TECHNIQUE: Multidetector CT imaging of the abdomen and pelvis was performed using the standard protocol following bolus administration of intravenous contrast. CONTRAST:  173m OMNIPAQUE IOHEXOL 300 MG/ML  SOLN COMPARISON:  03/22/2009 FINDINGS: Lower chest: No acute abnormality. Hepatobiliary: No focal liver abnormality is seen. No gallstones, gallbladder wall thickening, or biliary dilatation. Pancreas: Unremarkable. No pancreatic ductal dilatation or surrounding inflammatory changes. Spleen: Normal in size without focal abnormality. Adrenals/Urinary Tract: Adrenal glands are within normal limits. Kidneys are well visualized bilaterally. Nonobstructing renal calculi are seen in the right kidney. The largest of these measures 5 mm in greatest dimension. No ureteral stones are noted. The bladder is within normal limits. Some enhancement of the ureteral wall is noted as well as some patchy enhancement on delayed images consistent with pyelonephritis. This is consistent with recent UTI. Stomach/Bowel: Colon is decompressed. Postsurgical changes in the distal colon are seen. The appendix is within normal limits. Inflammatory changes of the duodenum are seen adjacent to the head of the pancreas within the second portion of the duodenum. There is a focal outpouching of fluid and air identified which likely represents a duodenal diverticulum and may represent some focal diverticulitis of the diverticulum. The less likely possibility of a walled off duodenal ulcer deserves consideration as well. No other small bowel abnormality is noted. No gastric abnormality is seen. Vascular/Lymphatic:  Aortic atherosclerosis. No enlarged abdominal or pelvic lymph nodes. Reproductive: Status post hysterectomy. No adnexal masses. Other: No abdominal wall hernia or abnormality. No abdominopelvic ascites. Musculoskeletal: Degenerative changes of lumbar spine are noted. InterStim device is seen IMPRESSION: Irregular enhancement of the right kidney on delayed images with evidence of enhancement of the collecting system wall and ureter consistent with underlying urinary tract infection and pyelonephritis. Multiple right renal calculi are seen without obstructive change. Inflammatory changes in the second portion of the duodenum likely related to duodenitis and inflamed duodenal diverticulum. Although possible a walled off duodenal ulcer appears less likely. Direct visualization may be helpful when the patient is clinically improved. Electronically Signed   By: MInez CatalinaM.D.   On: 12/26/2018 02:09    Microbiology: Recent Results (from the past 240 hour(s))  Urine culture     Status: Abnormal   Collection Time: 12/29/18  5:17 PM   Specimen: Urine, Random  Result Value Ref Range Status   Specimen Description URINE, RANDOM  Final   Special Requests NONE  Final   Culture (A)  Final    <10,000 COLONIES/mL INSIGNIFICANT GROWTH Performed at [facility], [place] [address], [place], [place] [address]    Report Status 12/30/2018 FINAL  Final  Blood culture (routine x 2)     Status: None (Preliminary result)   Collection Time: 12/29/18  5:54 PM   Specimen: BLOOD  Result Value Ref Range Status   Specimen Description BLOOD SITE NOT SPECIFIED  Final   Special Requests   Final    BOTTLES DRAWN AEROBIC AND ANAEROBIC Blood Culture adequate volume   Culture   Final    NO GROWTH 4 DAYS Performed at [facility], [place] [address] [place], [place] [address]    Report Status PENDING  Incomplete  Blood culture (routine x 2)     Status: None (Preliminary result)   Collection Time: 12/29/18  5:57 PM    Specimen: BLOOD  Result Value Ref Range Status   Specimen Description BLOOD SITE NOT SPECIFIED  Final   Special Requests   Final    BOTTLES DRAWN AEROBIC ONLY Blood Culture results may not be optimal due to an inadequate volume of blood received in culture bottles   Culture   Final    NO GROWTH 4 DAYS Performed at [facility], [place] [address] [place], [place] [address]    Report Status PENDING  Incomplete  SARS CORONAVIRUS 2 (TAT 6-24 HRS) Nasopharyngeal Nasopharyngeal Swab     Status: None   Collection Time: 12/29/18  6:17 PM   Specimen: Nasopharyngeal Swab  Result Value Ref Range Status   SARS Coronavirus 2 NEGATIVE NEGATIVE Final    Comment: (NOTE) SARS-CoV-2 target nucleic acids are NOT DETECTED. The SARS-CoV-2 RNA is generally detectable in upper and lower respiratory specimens during the acute phase of infection. Negative results do not preclude SARS-CoV-2 infection, do not rule out co-infections with other pathogens, and should not be used as the sole basis for treatment or other patient management decisions. Negative results must be combined with clinical observations, patient history, and epidemiological information. The expected result is Negative. Fact Sheet for Patients: SugarRoll.be Fact Sheet for Healthcare Providers: https://www.woods-mathews.com/ This test is not yet approved or cleared by the [place] FDA and  has been authorized for detection and/or diagnosis of SARS-CoV-2 by FDA under an Emergency Use Authorization (EUA). This EUA will remain  in effect (meaning this test can be used) for the duration of the COVID-19 declaration under Section 56 4(b)(1) of the Act, 21 U.S.C. section 360bbb-3(b)(1), unless the authorization is terminated or revoked sooner. Performed at [facility], [place] [address], [place], [place] [address]      Labs: Basic Metabolic Panel: Recent Labs  Lab 12/30/18 0500  12/31/18 0525 01/01/19 0354 01/02/19 0523 01/03/19 0646  NA 140 141 140 139 138  K 3.6 3.6 3.2* 3.3* 3.8  CL 113* 110 107 104 105  CO2 20* 19* 22 21* 20*  GLUCOSE 114* 113* 112* 137* 134*  BUN 29* 22* '17 13 16  ' CREATININE 1.10* 1.07* 1.03* 1.02* 1.34*  CALCIUM 8.0* 8.6* 8.7* 8.7* 8.6*   Liver Function Tests: Recent Labs  Lab 12/29/18 1717  AST 14*  ALT 12  ALKPHOS 62  BILITOT 0.3  PROT 6.2*  ALBUMIN 2.6*   Recent Labs  Lab 12/29/18 1717  LIPASE 15   No results  for input(s): AMMONIA in the last 168 hours. CBC: Recent Labs  Lab 12/29/18 1717  12/30/18 1314 12/31/18 0525 01/01/19 0354 01/02/19 0523 01/03/19 0646  WBC 13.5*   < > 10.0 8.0 8.5 9.2 9.3  NEUTROABS 10.8*  --   --   --   --  6.9 5.8  HGB 6.8*   < > 8.2* 7.9* 8.4* 8.4* 8.5*  HCT 21.7*   < > 25.4* 23.9* 26.4* 26.3* 26.5*  MCV 93.9   < > 91.0 88.8 91.3 92.0 92.0  PLT 498*   < > 419* 397 447* 465* 491*   < > = values in this interval not displayed.   Cardiac Enzymes: No results for input(s): CKTOTAL, CKMB, CKMBINDEX, TROPONINI in the last 168 hours. BNP: BNP (last 3 results) No results for input(s): BNP in the last 8760 hours.  ProBNP (last 3 results) No results for input(s): PROBNP in the last 8760 hours.  CBG: Recent Labs  Lab 01/02/19 1630 01/02/19 2040 01/03/19 0014 01/03/19 0423 01/03/19 0827  GLUCAP 159* 132* 151* 156* 253*       Signed:  Nita Sells MD   [facility] 01/03/2019, 9:54 AM

## 2019-01-03 NOTE — Progress Notes (Signed)
Daily Rounding Note  01/03/2019, 9:18 AM  LOS: 4 days   SUBJECTIVE:   Chief complaint:     Used 1 mg Dilaudid yesterday, it has been discontinued. No nausea.  Eating solid food with no repercussions.  2 small, formed, dark stools this morning the first she has had in several days. Abdominal pain almost completely resolved just a lingering bit remains.  OBJECTIVE:         Vital signs in last 24 hours:    Temp:  [98.2 F (36.8 C)-99.1 F (37.3 C)] 98.2 F (36.8 C) (11/20 [id number]) Pulse Rate:  [80-86] 86 (11/20 0608) Resp:  [16-20] 16 (11/20 0608) BP: (124-136)/(55-71) 124/55 (11/20 0608) SpO2:  [94 %-97 %] 96 % (11/20 [id number]) Last BM Date: (reports 2 weeks ago, passing gas) Filed Weights   12/31/18 0838 01/01/19 0619 01/02/19 0235  Weight: 86.4 kg 86 kg 85.9 kg   General: Looks well. Heart: RRR Chest: No cough or labored breathing.  Lungs clear Abdomen: Soft.  Minimal if any tenderness in the right abdomen active bowel sounds.  No distention. Extremities: No CCE. Neuro/Psych: Slightly anxious.  Fully alert and oriented.  Appropriate.  Cooperative.  Intake/Output from previous day: 11/19 0701 - 11/20 0700 In: 597 [P.O.:480; IV Piggyback:117] Out: 3 [Urine:3]  Intake/Output this shift: No intake/output data recorded.  Lab Results: Recent Labs    01/01/19 0354 01/02/19 0523 01/03/19 0646  WBC 8.5 9.2 9.3  HGB 8.4* 8.4* 8.5*  HCT 26.4* 26.3* 26.5*  PLT 447* 465* 491*   BMET Recent Labs    01/01/19 0354 01/02/19 0523 01/03/19 0646  NA 140 139 138  K 3.2* 3.3* 3.8  CL 107 104 105  CO2 22 21* 20*  GLUCOSE 112* 137* 134*  BUN 17 13 16   CREATININE 1.03* 1.02* 1.34*  CALCIUM 8.7* 8.7* 8.6*   LFT No results for input(s): PROT, ALBUMIN, AST, ALT, ALKPHOS, BILITOT, BILIDIR, IBILI in the last 72 hours. PT/INR No results for input(s): LABPROT, INR in the last 72 hours. Hepatitis Panel No results for  input(s): HEPBSAG, HCVAB, HEPAIGM, HEPBIGM in the last 72 hours.  Studies/Results: No results found.   Scheduled Meds: . amphetamine-dextroamphetamine  30 mg Oral Q0600  . amphetamine-dextroamphetamine  15 mg Oral Q1400  . cariprazine  3 mg Oral Q48H  . citalopram  40 mg Oral QHS  . doxepin  50 mg Oral QHS  . gabapentin  300 mg Oral BID  . insulin aspart  0-9 Units Subcutaneous Q4H  . levothyroxine  112 mcg Oral QAC breakfast  . metFORMIN  1,000 mg Oral BID WC  . pantoprazole  40 mg Oral BID  . potassium chloride  40 mEq Oral Daily   Continuous Infusions: PRN Meds:.acetaminophen **OR** acetaminophen, hydrALAZINE, HYDROmorphone (DILAUDID) injection, ondansetron **OR** ondansetron (ZOFRAN) IV   ASSESMENT:   *    Melena, right upper quadrant abdominal pain. 12/31/2018 EGD: Erosive gastropathy, no bleeding stigmata. Nonbleeding duodenal ulcers, likely cause of bleeding and symptoms.  Path: gastric antral and oxyntic mucosa with no specific histopathologic changes.  No H. Pylori. PPI gtt for 72 hours >> Protonix 40 po bid.    *    Blood loss anemia.  2 PRBCs transfused thus far.  Hgb stable, 8.5 Feraheme infusion 11/19  *    AKI.   PLAN   *    From GI standpoint she is cleared for discharge.  I will arrange follow-up office appointment with  either Dr. Tarri Glenn or At the GI office in about a month's time  *   Protonix 40 mg p.o. twice daily for 1 month then drop to once daily    Azucena Freed  01/03/2019, 9:18 AM Phone [phone]

## 2019-01-17 ENCOUNTER — Ambulatory Visit: Payer: BC Managed Care – PPO | Admitting: Family Medicine

## 2019-01-17 ENCOUNTER — Other Ambulatory Visit: Payer: Self-pay

## 2019-01-17 ENCOUNTER — Encounter: Payer: Self-pay | Admitting: Family Medicine

## 2019-01-17 VITALS — BP 116/70 | HR 99 | Temp 97.4°F | Ht 62.0 in | Wt 189.6 lb

## 2019-01-17 DIAGNOSIS — E6609 Other obesity due to excess calories: Secondary | ICD-10-CM

## 2019-01-17 DIAGNOSIS — E119 Type 2 diabetes mellitus without complications: Secondary | ICD-10-CM | POA: Diagnosis not present

## 2019-01-17 DIAGNOSIS — Z6834 Body mass index (BMI) 34.0-34.9, adult: Secondary | ICD-10-CM

## 2019-01-17 DIAGNOSIS — D500 Iron deficiency anemia secondary to blood loss (chronic): Secondary | ICD-10-CM

## 2019-01-17 DIAGNOSIS — Z87440 Personal history of urinary (tract) infections: Secondary | ICD-10-CM

## 2019-01-17 DIAGNOSIS — E538 Deficiency of other specified B group vitamins: Secondary | ICD-10-CM

## 2019-01-17 DIAGNOSIS — N10 Acute tubulo-interstitial nephritis: Secondary | ICD-10-CM

## 2019-01-17 DIAGNOSIS — Z8719 Personal history of other diseases of the digestive system: Secondary | ICD-10-CM

## 2019-01-17 DIAGNOSIS — K922 Gastrointestinal hemorrhage, unspecified: Secondary | ICD-10-CM

## 2019-01-17 LAB — POC URINALSYSI DIPSTICK (AUTOMATED)
Bilirubin, UA: 1
Blood, UA: NEGATIVE
Glucose, UA: NEGATIVE
Nitrite, UA: NEGATIVE
Protein, UA: POSITIVE — AB
Spec Grav, UA: >=1.03 — AB (ref 1.010–1.025)
Urobilinogen, UA: 0.2 E.U./dL
pH, UA: 6 (ref 5.0–8.0)

## 2019-01-17 MED ORDER — CYANOCOBALAMIN 1000 MCG/ML IJ SOLN
1000.0000 ug | Freq: Once | INTRAMUSCULAR | Status: AC
  Administered 2019-01-17: 1000 ug via INTRAMUSCULAR

## 2019-01-17 MED ORDER — PANTOPRAZOLE SODIUM 40 MG PO TBEC: 40.0000 mg | DELAYED_RELEASE_TABLET | Freq: Two times a day (BID) | ORAL | 0 refills | Status: DC

## 2019-01-17 NOTE — Patient Instructions (Addendum)
Push water  Drink at least 64 oz of fluid daily (mostly water)   Labs today- kidney/cbc/iron and A1C   B12 shot today   Continue current medicines  Urine culture - we will call with result   Let us know if you need a referral to urology   If any symptoms return please let me know

## 2019-01-17 NOTE — Progress Notes (Signed)
Subjective:    Patient ID: Kelli Cruz, female    DOB: 01-29-1961, 58 y.o.   MRN: [id number]  HPI Here for f/u of hospitalization from 11/15 to 01/03/19 for acute GI bleeding   She presented with R flank pain and n/v in setting of recent utis while taking a lot of nsaids Noted hb drop of 4g    Hospital Course:  Multiple duodenal erosions EGD performed 11/17 as below Protonix GTT transition to p.o. twice daily Protonix 40 for at least a month and then transition to 40 daily Acute blood loss anemia Hemoglobin up from admission 6.5 after 2 units PRBC to 8.4and has been stable at that level for the past 24 hours-given IV iron although unclear if had low iron stores Outpatient follow-up Right recurrent pyelonephritis Was treated recently 3 times for pyelonephritis-urine culture growing less than 10,000 and received 3 days of IV ceftriaxone-s stopped antibiotics afebrile DM TY 2 Blood sugars ranging between 130 and 230 resume gabapentin 300 twice daily Metformin 1000 twice daily on discharge home Hypothyroidism Continue Synthroid IV 66 microgram History of rectal cancer status post low anterior resection 08/2008 Monitor trends ADHD, depression Continue Adderall 30 6 AM, 15 2 PM, Xanax 0.5 at bedtime anxiety Resume cariprazine 3 mg every other day Xanax 0.5 as needed at bedtime anxiety citalopram 40 at bedtime doxepin 50 at bedtime HTN Holding lisinopril 2.5 daily at this time but this was restarted on discharge  She was told to hold all nsaids permanently  On discharge:  Lab Results  Component Value Date   CREATININE 1.34 (H) 01/03/2019   BUN 16 01/03/2019   NA 138 01/03/2019   K 3.8 01/03/2019   CL 105 01/03/2019   CO2 20 (L) 01/03/2019   Lab Results  Component Value Date   WBC 9.3 01/03/2019   HGB 8.5 (L) 01/03/2019   HCT 26.5 (L) 01/03/2019   MCV 92.0 01/03/2019   PLT 491 (H) 01/03/2019   Glucose of 134 Blood cultures neg   Urine cx on 11/15 -insignificant  growth   Wt Readings from Last 3 Encounters:  01/17/19 189 lb 9 oz (86 kg)  01/02/19 189 lb 4.8 oz (85.9 kg)  10/09/18 204 lb (92.5 kg)  has been sick -not good appetite for a while  34.67 kg/m   UA today Results for orders placed or performed in visit on 01/17/19  POCT Urinalysis Dipstick (Automated)  Result Value Ref Range   Color, UA Yellow    Clarity, UA Hazy    Glucose, UA Negative Negative   Bilirubin, UA 1 mg/dL    Ketones, UA Trace    Spec Grav, UA >=1.030 (A) 1.010 - 1.025   Blood, UA Negative    pH, UA 6.0 5.0 - 8.0   Protein, UA Positive (A) Negative   Urobilinogen, UA 0.2 0.2 or 1.0 E.U./dL   Nitrite, UA Negative    Leukocytes, UA Small (1+) (A) Negative   may not be drinking enough fluids    Previously to her above admission was tx in ED on 11/11 for pyelonphritis  Had been followed by uroogy  Intol of keflex  tx with cefdinir/zofran/percocet   Due for B12 shot Lab Results  Component Value Date   VITAMINB12 204 (L) 09/16/2018    DM 2 Lab Results  Component Value Date   HGBA1C 7.3 (H) 09/16/2018  was eating more and snacking at that time On ace and statin  Blood sugar has been generally down  Had times when she could not keep down her medicine   Now she is back to eating what she wants   Now feels much better  Not quite 100%   Needs a new urologist   Wants to see a particular doctor with interstim  Does not remember the name of the doctor   No n/v this week  A little blood in stool - better than it was  Was not given iron   She has appt with GI on 12/24 for f/u of ulcers   Patient Active Problem List   Diagnosis Date Noted  . Acute GI bleeding 12/29/2018  . Acute pyelonephritis 12/29/2018  . Nausea & vomiting 12/29/2018  . Anemia 02/19/2017  . Vitamin D deficiency 02/19/2017  . Knee pain, right 11/10/2016  . Proteinuria 02/18/2016  . Exposure to communicable disease 08/04/2015  . Obesity 02/04/2015  . Hypersomnia with sleep apnea  01/06/2015  . Insomnia due to mental condition   . Tingling 08/12/2014  . Burning sensation of mouth 08/12/2014  . Numbness 10/24/2011  . Routine general medical examination at a health care facility 10/24/2011  . TMJ arthralgia 08/14/2011  . Poor posture 07/26/2011  . B12 deficiency 04/13/2010  . ADENOCARCINOMA, SIGMOID COLON 08/11/2008  . Hyperlipidemia 07/08/2008  . Hypothyroidism 09/12/2006  . Diabetes type 2, controlled ([place]) 09/12/2006  . Depression with anxiety 09/12/2006  . ALLERGIC RHINITIS, SEASONAL 09/12/2006  . FIBROCYSTIC BREAST DISEASE 09/12/2006  . MIGRAINES, HX OF 09/12/2006   Past Medical History:  Diagnosis Date  . Anemia   . Anxiety   . Arthritis   . Bipolar 1 disorder ([place])   . Cancer [facility])    sigmoid colon cancer  . Depression   . Diabetes mellitus    type II  . Endometriosis   . Family history of malignant neoplasm of gastrointestinal tract   . Ganglion cyst   . GERD (gastroesophageal reflux disease)   . Hyperlipidemia   . Hypothyroidism   . Insomnia due to mental condition    Past Surgical History:  Procedure Laterality Date  . ABDOMINAL HYSTERECTOMY    . ABDOMINAL HYSTERECTOMY    . BIOPSY  12/31/2018   Procedure: BIOPSY;  Surgeon: Doran Stabler, MD;  Location: [facility] ENDOSCOPY;  Service: Gastroenterology;;  . COLON RESECTION  June 2010  . COLON SURGERY    . COLONOSCOPY    . DILATION AND CURETTAGE OF UTERUS  2006   miscarriage   . ESOPHAGOGASTRODUODENOSCOPY N/A 12/31/2018   Procedure: ESOPHAGOGASTRODUODENOSCOPY (EGD);  Surgeon: Doran Stabler, MD;  Location: [facility];  Service: Gastroenterology;  Laterality: N/A;  . GANGLION CYST EXCISION    . HERNIA REPAIR    . INTERSTIM IMPLANT PLACEMENT Left 12/11/2012   stimulator is on the left but the electrodes go to the right  . KNEE DISLOCATION SURGERY    . LAPAROSCOPY  1997   D & C for endometriosis   Social History   Tobacco Use  . Smoking status: Passive Smoke Exposure - Never  Smoker  . Smokeless tobacco: Never Used  . Tobacco comment: husband quit smoking  Substance Use Topics  . Alcohol use: Yes    Alcohol/week: 0.0 standard drinks    Comment: socially twice a month at most or less  . Drug use: No   Family History  Problem Relation Age of Onset  . Breast cancer Mother   . Diabetes Mother   . Coronary artery disease Mother   . Kidney disease Mother  renal insufficiency  . Hyperlipidemia Mother   . Diabetes Father   . Coronary artery disease Father   . Colon cancer Father   . Colon cancer Paternal Grandmother   . Breast cancer Maternal Aunt   . Breast cancer Paternal Aunt   . Esophageal cancer Neg Hx   . Rectal cancer Neg Hx   . Stomach cancer Neg Hx    Allergies  Allergen Reactions  . Hydrocodone Nausea And Vomiting    No reaction if taken with enough food  . Keflex [Cephalexin] Nausea And Vomiting and Other (See Comments)    Light-headed/dizziness/weakness  . Mirabegron Other (See Comments)    Trouble breathing and swollen tongue  . Milk-Related Compounds Nausea And Vomiting   Current Outpatient Medications on File Prior to Visit  Medication Sig Dispense Refill  . ALPRAZolam (XANAX) 0.5 MG tablet Take 0.5 mg by mouth at bedtime as needed for anxiety (panic attack).     Marland Kitchen amphetamine-dextroamphetamine (ADDERALL XR) 30 MG 24 hr capsule Take 30 mg by mouth daily at 6 (six) AM.     . amphetamine-dextroamphetamine (ADDERALL) 15 MG tablet Take 15 mg by mouth daily at 2 PM.     . atorvastatin (LIPITOR) 10 MG tablet Take 1 tablet (10 mg total) by mouth daily. (Patient taking differently: Take 10 mg by mouth at bedtime. ) 90 tablet 3  . Blood Glucose Monitoring Suppl (ONETOUCH VERIO FLEX SYSTEM) w/Device KIT Check sugar twice daily and as needed. DX 11.9 1 kit 12  . cariprazine (VRAYLAR) capsule Take 3 mg by mouth See admin instructions. Take one capsule (3 mg) by mouth every other night    . Cholecalciferol (VITAMIN D3) 125 MCG (5000 UT) CAPS  Take 5,000 Units by mouth daily.    . citalopram (CELEXA) 40 MG tablet Take 40 mg by mouth at bedtime.     . cyanocobalamin (,VITAMIN B-12,) 1000 MCG/ML injection Inject 1 mL (1,000 mcg total) into the muscle every 3 (three) months. every 8 weeks (Patient taking differently: Inject 1,000 mcg into the muscle every 30 (thirty) days. ) 1 mL 5  . Cyanocobalamin (VITAMIN B-12) 5000 MCG TBDP Take 5,000 mcg by mouth daily.    . Diabetic Sterile Lancets MISC by Does not apply route. Check glucose two times a day and as needed for out of control DM 250.00     . doxepin (SINEQUAN) 50 MG capsule Take 50 mg by mouth at bedtime.   1  . gabapentin (NEURONTIN) 300 MG capsule Take 1 capsule (300 mg total) by mouth 2 (two) times daily. 180 capsule 3  . glipiZIDE (GLUCOTROL XL) 5 MG 24 hr tablet Take 1 tablet (5 mg total) by mouth daily. 90 tablet 3  . glucose blood (ONETOUCH VERIO) test strip Strips for one touch verio flex meter. Check sugar twice daily DX E11.9 200 each 12  . glucose blood test strip 1 each by Other route. Check glucose two times a day and as needed for out of control DM 250.00     . levothyroxine (SYNTHROID) 112 MCG tablet Take 1 tablet (112 mcg total) by mouth daily. (Patient taking differently: Take 112 mcg by mouth daily before breakfast. ) 90 tablet 3  . LIFESCAN FINEPOINT LANCETS MISC Lancets and device for one touch verio flex meter. Use daily. DX 11.9 200 each 12  . lisinopril (ZESTRIL) 5 MG tablet Take 0.5 tablets (2.5 mg total) by mouth daily. (Patient taking differently: Take 2.5 mg by mouth at bedtime. )  45 tablet 3  . metFORMIN (GLUCOPHAGE) 1000 MG tablet Take 1 tablet (1,000 mg total) by mouth 2 (two) times daily with a meal. 180 tablet 3  . ondansetron (ZOFRAN ODT) 4 MG disintegrating tablet Take 1 tablet (4 mg total) by mouth every 8 (eight) hours as needed for nausea or vomiting. 20 tablet 0  . [DISCONTINUED] cholestyramine (QUESTRAN) 4 G packet Mix with juice or water and drink  every 1-3 days. 30 each 1   No current facility-administered medications on file prior to visit.      Review of Systems  Constitutional: Positive for fatigue. Negative for activity change, appetite change, fever and unexpected weight change.  HENT: Negative for congestion, ear pain, rhinorrhea, sinus pressure and sore throat.   Eyes: Negative for pain, redness and visual disturbance.  Respiratory: Negative for cough, shortness of breath and wheezing.   Cardiovascular: Negative for chest pain and palpitations.  Gastrointestinal: Negative for abdominal pain, blood in stool, constipation and diarrhea.  Endocrine: Negative for polydipsia and polyuria.  Genitourinary: Negative for dysuria, frequency and urgency.  Musculoskeletal: Negative for arthralgias, back pain and myalgias.  Skin: Negative for pallor and rash.  Allergic/Immunologic: Negative for environmental allergies.  Neurological: Negative for dizziness, syncope and headaches.  Hematological: Negative for adenopathy. Does not bruise/bleed easily.  Psychiatric/Behavioral: Negative for decreased concentration and dysphoric mood. The patient is not nervous/anxious.        Objective:   Physical Exam Constitutional:      General: She is not in acute distress.    Appearance: Normal appearance. She is well-developed. She is obese. She is not ill-appearing.  HENT:     Head: Normocephalic and atraumatic.  Eyes:     Conjunctiva/sclera: Conjunctivae normal.     Pupils: Pupils are equal, round, and reactive to light.  Neck:     Musculoskeletal: Normal range of motion and neck supple.     Thyroid: No thyromegaly.     Vascular: No carotid bruit or JVD.  Cardiovascular:     Rate and Rhythm: Normal rate and regular rhythm.     Heart sounds: Normal heart sounds. No gallop.   Pulmonary:     Effort: Pulmonary effort is normal. No respiratory distress.     Breath sounds: Normal breath sounds. No wheezing or rales.  Abdominal:     General:  Bowel sounds are normal. There is no distension or abdominal bruit.     Palpations: Abdomen is soft. There is no mass.     Tenderness: There is no abdominal tenderness. There is no left CVA tenderness, guarding or rebound.     Comments: R CVA is mildly tender- improved per pt  Musculoskeletal:     Right lower leg: No edema.     Left lower leg: No edema.  Lymphadenopathy:     Cervical: No cervical adenopathy.  Skin:    General: Skin is warm and dry.     Coloration: Skin is not pale.     Findings: No erythema or rash.     Comments: Nl skin color and turgor  Brisk cap refill  Neurological:     Mental Status: She is alert.     Coordination: Coordination normal.     Gait: Gait normal.     Deep Tendon Reflexes: Reflexes are normal and symmetric.  Psychiatric:        Mood and Affect: Mood normal.           Assessment & Plan:   Problem List Items Addressed  This Visit      Digestive   Acute GI bleeding - Primary    Reviewed hospital records, lab results and studies in detail  Duodenal ulcer seen on EGD Caused by nsaid use Clinically improved with high dose PPI S/p transfusion/not currently on iron Re check cbc today  GI f/u is planned       Relevant Orders   CBC w/Diff (Completed)   Ferritin (Completed)     Endocrine   Diabetes type 2, controlled ([place])    A1C today  Thinks it may be improved-glucose levels have been down  On ace and statin       Relevant Orders   Hemoglobin A1c (Completed)     Genitourinary   Acute pyelonephritis    S/p this- much clinical improvement Reviewed hospital records, lab results and studies in detail   No longer taking nsaids Urine cx and cbc and renal panel today  She plans to est with new urologist and will call us if she needs a referral         Relevant Orders   Urine Culture (Completed)   Renal function panel (Completed)     Other   B12 deficiency    Due for B12 shot, given today      Obesity    Discussed how this  problem influences overall health and the risks it imposes  Reviewed plan for weight loss with lower calorie diet (via better food choices and also portion control or program like weight watchers) and exercise building up to or more than 30 minutes 5 days per week including some aerobic activity         Anemia    S/p GI bleed from duodenal ulcer from nsaid Reviewed hospital records, lab results and studies in detail  Much clinical improvement Had 2 u transfusion  Cbc today       Other Visit Diagnoses    History of UTI       Relevant Orders   POCT Urinalysis Dipstick (Automated) (Completed)

## 2019-01-18 LAB — URINE CULTURE
MICRO NUMBER:: 1165024
SPECIMEN QUALITY:: ADEQUATE

## 2019-01-18 LAB — HEMOGLOBIN A1C
Hgb A1c MFr Bld: 5.9 % of total Hgb — ABNORMAL HIGH (ref <5.7)
Mean Plasma Glucose: 123 (calc)
eAG (mmol/L): 6.8 (calc)

## 2019-01-18 LAB — CBC WITH DIFFERENTIAL/PLATELET
Absolute Monocytes: 268 cells/uL (ref 200–950)
Basophils Absolute: 40 cells/uL (ref 0–200)
Basophils Relative: 0.6 %
Eosinophils Absolute: 127 cells/uL (ref 15–500)
Eosinophils Relative: 1.9 %
HCT: 29.8 % — ABNORMAL LOW (ref 35.0–45.0)
Hemoglobin: 9.7 g/dL — ABNORMAL LOW (ref 11.7–15.5)
Lymphs Abs: 2030 cells/uL (ref 850–3900)
MCH: 30.6 pg (ref 27.0–33.0)
MCHC: 32.6 g/dL (ref 32.0–36.0)
MCV: 94 fL (ref 80.0–100.0)
MPV: 10.9 fL (ref 7.5–12.5)
Monocytes Relative: 4 %
Neutro Abs: 4234 cells/uL (ref 1500–7800)
Neutrophils Relative %: 63.2 %
Platelets: 345 10*3/uL (ref 140–400)
RBC: 3.17 10*6/uL — ABNORMAL LOW (ref 3.80–5.10)
RDW: 16.9 % — ABNORMAL HIGH (ref 11.0–15.0)
Total Lymphocyte: 30.3 %
WBC: 6.7 10*3/uL (ref 3.8–10.8)

## 2019-01-18 LAB — RENAL FUNCTION PANEL
Albumin: 3.9 g/dL (ref 3.6–5.1)
BUN/Creatinine Ratio: 13 (calc) (ref 6–22)
BUN: 21 mg/dL (ref 7–25)
CO2: 20 mmol/L (ref 20–32)
Calcium: 9.2 mg/dL (ref 8.6–10.4)
Chloride: 105 mmol/L (ref 98–110)
Creat: 1.56 mg/dL — ABNORMAL HIGH (ref 0.50–1.05)
Glucose, Bld: 189 mg/dL — ABNORMAL HIGH (ref 65–99)
Phosphorus: 3.6 mg/dL (ref 2.5–4.5)
Potassium: 4.3 mmol/L (ref 3.5–5.3)
Sodium: 140 mmol/L (ref 135–146)

## 2019-01-18 LAB — FERRITIN: Ferritin: 169 ng/mL (ref 16–232)

## 2019-01-19 NOTE — Assessment & Plan Note (Signed)
S/p this- much clinical improvement Reviewed hospital records, lab results and studies in detail   No longer taking nsaids Urine cx and cbc and renal panel today  She plans to est with new urologist and will call us if she needs a referral

## 2019-01-19 NOTE — Assessment & Plan Note (Signed)
Discussed how this problem influences overall health and the risks it imposes  Reviewed plan for weight loss with lower calorie diet (via better food choices and also portion control or program like weight watchers) and exercise building up to or more than 30 minutes 5 days per week including some aerobic activity    

## 2019-01-19 NOTE — Assessment & Plan Note (Signed)
A1C today  Thinks it may be improved-glucose levels have been down  On ace and statin

## 2019-01-19 NOTE — Assessment & Plan Note (Signed)
Due for B12 shot, given today

## 2019-01-19 NOTE — Assessment & Plan Note (Signed)
S/p GI bleed from duodenal ulcer from nsaid Reviewed hospital records, lab results and studies in detail  Much clinical improvement Had 2 u transfusion  Cbc today

## 2019-01-19 NOTE — Assessment & Plan Note (Signed)
Reviewed hospital records, lab results and studies in detail  Duodenal ulcer seen on EGD Caused by nsaid use Clinically improved with high dose PPI S/p transfusion/not currently on iron Re check cbc today  GI f/u is planned

## 2019-01-29 DIAGNOSIS — F3341 Major depressive disorder, recurrent, in partial remission: Secondary | ICD-10-CM | POA: Diagnosis not present

## 2019-01-29 DIAGNOSIS — F411 Generalized anxiety disorder: Secondary | ICD-10-CM | POA: Diagnosis not present

## 2019-01-29 DIAGNOSIS — F900 Attention-deficit hyperactivity disorder, predominantly inattentive type: Secondary | ICD-10-CM | POA: Diagnosis not present

## 2019-02-03 ENCOUNTER — Telehealth: Payer: Self-pay | Admitting: Family Medicine

## 2019-02-03 DIAGNOSIS — N1832 Chronic kidney disease, stage 3b: Secondary | ICD-10-CM | POA: Insufficient documentation

## 2019-02-03 DIAGNOSIS — R7989 Other specified abnormal findings of blood chemistry: Secondary | ICD-10-CM

## 2019-02-03 DIAGNOSIS — D500 Iron deficiency anemia secondary to blood loss (chronic): Secondary | ICD-10-CM

## 2019-02-03 NOTE — Telephone Encounter (Signed)
-----   Message from Ellamae Sia sent at 01/29/2019 10:12 AM EST ----- Regarding: lab orders for Tuesday, 12.22.20 Lab orders

## 2019-02-04 ENCOUNTER — Other Ambulatory Visit (INDEPENDENT_AMBULATORY_CARE_PROVIDER_SITE_OTHER): Payer: BC Managed Care – PPO

## 2019-02-04 DIAGNOSIS — D500 Iron deficiency anemia secondary to blood loss (chronic): Secondary | ICD-10-CM | POA: Diagnosis not present

## 2019-02-04 DIAGNOSIS — R7989 Other specified abnormal findings of blood chemistry: Secondary | ICD-10-CM | POA: Diagnosis not present

## 2019-02-05 ENCOUNTER — Encounter: Payer: Self-pay | Admitting: *Deleted

## 2019-02-05 LAB — RENAL FUNCTION PANEL
Albumin: 4.1 g/dL (ref 3.5–5.2)
BUN: 21 mg/dL (ref 6–23)
CO2: 22 mEq/L (ref 19–32)
Calcium: 9.3 mg/dL (ref 8.4–10.5)
Chloride: 105 mEq/L (ref 96–112)
Creatinine, Ser: 1.08 mg/dL (ref 0.40–1.20)
GFR: 52.45 mL/min — ABNORMAL LOW (ref >60.00)
Glucose, Bld: 138 mg/dL — ABNORMAL HIGH (ref 70–99)
Phosphorus: 3.4 mg/dL (ref 2.3–4.6)
Potassium: 3.9 mEq/L (ref 3.5–5.1)
Sodium: 140 mEq/L (ref 135–145)

## 2019-02-05 LAB — CBC WITH DIFFERENTIAL/PLATELET
Basophils Absolute: 0 10*3/uL (ref 0.0–0.1)
Basophils Relative: 0.7 % (ref 0.0–3.0)
Eosinophils Absolute: 0.1 10*3/uL (ref 0.0–0.7)
Eosinophils Relative: 2.1 % (ref 0.0–5.0)
HCT: 33.4 % — ABNORMAL LOW (ref 36.0–46.0)
Hemoglobin: 10.9 g/dL — ABNORMAL LOW (ref 12.0–15.0)
Lymphocytes Relative: 25.9 % (ref 12.0–46.0)
Lymphs Abs: 1.4 10*3/uL (ref 0.7–4.0)
MCHC: 32.6 g/dL (ref 30.0–36.0)
MCV: 96.2 fl (ref 78.0–100.0)
Monocytes Absolute: 0.3 10*3/uL (ref 0.1–1.0)
Monocytes Relative: 5.1 % (ref 3.0–12.0)
Neutro Abs: 3.5 10*3/uL (ref 1.4–7.7)
Neutrophils Relative %: 66.2 % (ref 43.0–77.0)
Platelets: 307 10*3/uL (ref 150.0–400.0)
RBC: 3.48 Mil/uL — ABNORMAL LOW (ref 3.87–5.11)
RDW: 18.9 % — ABNORMAL HIGH (ref 11.5–15.5)
WBC: 5.3 10*3/uL (ref 4.0–10.5)

## 2019-02-06 ENCOUNTER — Encounter: Payer: Self-pay | Admitting: Gastroenterology

## 2019-02-06 ENCOUNTER — Ambulatory Visit: Payer: BC Managed Care – PPO | Admitting: Gastroenterology

## 2019-02-06 VITALS — BP 124/82 | HR 104 | Temp 98.1°F | Ht 63.0 in | Wt 192.4 lb

## 2019-02-06 DIAGNOSIS — D500 Iron deficiency anemia secondary to blood loss (chronic): Secondary | ICD-10-CM

## 2019-02-06 DIAGNOSIS — K264 Chronic or unspecified duodenal ulcer with hemorrhage: Secondary | ICD-10-CM | POA: Diagnosis not present

## 2019-02-06 NOTE — Patient Instructions (Addendum)
I recommend that you take iron (ferrous sulfate) 325 mg every other day. GI side effects are common including nausea, vomiting, diarrhea, constipation, and black stools but are hopefully when you take it every other day.    Continue on Protonix, but, decrease the frequency to every morning. Ideally take it 30-60 minutes before breakfast.   Continue to avoid all non-steroidal anti-inflammatory medications including ibuprofen.  I am so glad that you are feeling better! Please call me with any questions or concerns in the future.

## 2019-02-06 NOTE — Progress Notes (Signed)
Referring Provider: Abner Greenspan, MD Primary Care Physician:  Tower, [name], MD  Reason for Consultation:  Hospital follow-up   IMPRESSION:  NSAID associated PUD with associated GI bleeding GI blood loss anemia Personal history of colon cancer Hyperplastic polyps on colonoscopy 10/09/2018  Symptoms leading up to recent hospitalization have resolved.  Labs show anemia is improving.  Her hemoglobin is now 10.9.  PLAN: Continue pantoprazole, decrease dose to 83m QAM Continue to avoid NSAIDs OTC iron recommended Colonoscopy 2025  Return to this clinic as needed  Please see the "Patient Instructions" section for addition details about the plan.  HPI: Kelli WINGERis a 58y.o. female known to me for her surveillance colonoscopy 10/09/2018.  She returns now in scheduled follow-up after recent hospitalization for GI bleed.  The interval history is obtained through the patient and review of her electronic health record.  She was hospitalized in November for symptomatic, bleeding duodenal ulcers and erosive gastropathy presenting with melena and GI blood loss anemia.  Pathology results showed gastric antral and oxyntic mucosa with no specific histologic changes.  There was no H. Pylori.  She had been using significant NSAIDs prior to her hospitalization.  Hemoglobin nadired 12/31/2018 at 7.9.  She was given 2 units of packed red blood cells as well as a Feraheme infusion 11/19.  Hemoglobin on discharge was 8.5.  More recent labs from 02/04/2019 show hgb 10.9, MCV 96, RDW 18.9  Using ibuprofen for persistent UTI and burning tongue syndrome leading up to PUD. Up to 1200 mg daily.  No longer using NSAIDs. Tired towards the end of the day. Intermittent right-sided abdominal pain has largely resolved since her hospitalization. Eating and appetite has returned. Some straining with defecation. No pain with defecation. Some bleeding. Stools are brown.  Not taking iron supplements.    Personal history of colon cancer s/p resection 2010 Prior colonoscopy with Dr. KDeatra Ina9/11/15 Most recent colonoscopy 10/09/18: 2 small hyperplastic polyps removed on colonoscopy 10/09/2018   Father and paternal grandfather with colon cancer in their 729s No other known family history of colon cancer or polyps. No family history of uterine/endometrial cancer, pancreatic cancer or gastric/stomach cancer.   Past Medical History:  Diagnosis Date  . Anemia   . Anxiety   . Arthritis   . [place] 1 [place]   . Cancer ([facility]    sigmoid colon cancer  . Depression   . Diabetes mellitus    type II  . Endometriosis   . Family history of malignant neoplasm of gastrointestinal tract   . Ganglion cyst   . GERD (gastroesophageal reflux disease)   . Hyperlipidemia   . Hypothyroidism   . Insomnia due to mental condition     Past Surgical History:  Procedure Laterality Date  . ABDOMINAL HYSTERECTOMY    . ABDOMINAL HYSTERECTOMY    . BIOPSY  12/31/2018   Procedure: BIOPSY;  Surgeon: DDoran Stabler MD;  [facility];  Service: Gastroenterology;;  . COLON RESECTION  June 2010  . COLON SURGERY    . COLONOSCOPY    . DILATION AND CURETTAGE OF UTERUS  2006   miscarriage   . ESOPHAGOGASTRODUODENOSCOPY N/A 12/31/2018   Procedure: ESOPHAGOGASTRODUODENOSCOPY (EGD);  Surgeon: DDoran Stabler MD;  [place]  Service: Gastroenterology;  Laterality: N/A;  . GANGLION CYST EXCISION    . HERNIA REPAIR    . INTERSTIM IMPLANT PLACEMENT Left 12/11/2012   stimulator is on the left but the  electrodes go to the right  . KNEE DISLOCATION SURGERY    . LAPAROSCOPY  1997   D & C for endometriosis    Current Outpatient Medications  Medication Sig Dispense Refill  . ALPRAZolam (XANAX) 0.5 MG tablet Take 0.5 mg by mouth at bedtime as needed for anxiety (panic attack).     Marland Kitchen amphetamine-dextroamphetamine (ADDERALL XR) 30 MG 24 hr capsule Take 30 mg by mouth daily at 6 (six)  AM.     . amphetamine-dextroamphetamine (ADDERALL) 15 MG tablet Take 15 mg by mouth daily at 2 PM.     . atorvastatin (LIPITOR) 10 MG tablet Take 1 tablet (10 mg total) by mouth daily. (Patient taking differently: Take 10 mg by mouth at bedtime. ) 90 tablet 3  . Blood Glucose Monitoring Suppl (ONETOUCH VERIO FLEX SYSTEM) w/Device KIT Check sugar twice daily and as needed. DX 11.9 1 kit 12  . cariprazine (VRAYLAR) capsule Take 3 mg by mouth See admin instructions. Take one capsule (3 mg) by mouth every other night    . Cholecalciferol (VITAMIN D3) 125 MCG (5000 UT) CAPS Take 5,000 Units by mouth daily.    . citalopram (CELEXA) 40 MG tablet Take 40 mg by mouth at bedtime.     . cyanocobalamin (,VITAMIN B-12,) 1000 MCG/ML injection Inject 1 mL (1,000 mcg total) into the muscle every 3 (three) months. every 8 weeks (Patient taking differently: Inject 1,000 mcg into the muscle every 30 (thirty) days. ) 1 mL 5  . Cyanocobalamin (VITAMIN B-12) 5000 MCG TBDP Take 5,000 mcg by mouth daily.    . Diabetic Sterile Lancets MISC by Does not apply route. Check glucose two times a day and as needed for out of control DM 250.00     . doxepin (SINEQUAN) 50 MG capsule Take 50 mg by mouth at bedtime.   1  . gabapentin (NEURONTIN) 300 MG capsule Take 1 capsule (300 mg total) by mouth 2 (two) times daily. 180 capsule 3  . glipiZIDE (GLUCOTROL XL) 5 MG 24 hr tablet Take 1 tablet (5 mg total) by mouth daily. 90 tablet 3  . glucose blood (ONETOUCH VERIO) test strip Strips for one touch verio flex meter. Check sugar twice daily DX E11.9 200 each 12  . glucose blood test strip 1 each by Other route. Check glucose two times a day and as needed for out of control DM 250.00     . levothyroxine (SYNTHROID) 112 MCG tablet Take 1 tablet (112 mcg total) by mouth daily. (Patient taking differently: Take 112 mcg by mouth daily before breakfast. ) 90 tablet 3  . LIFESCAN FINEPOINT LANCETS MISC Lancets and device for one touch verio flex  meter. Use daily. DX 11.9 200 each 12  . lisinopril (ZESTRIL) 5 MG tablet Take 0.5 tablets (2.5 mg total) by mouth daily. (Patient taking differently: Take 2.5 mg by mouth at bedtime. ) 45 tablet 3  . metFORMIN (GLUCOPHAGE) 1000 MG tablet Take 1 tablet (1,000 mg total) by mouth 2 (two) times daily with a meal. 180 tablet 3  . ondansetron (ZOFRAN ODT) 4 MG disintegrating tablet Take 1 tablet (4 mg total) by mouth every 8 (eight) hours as needed for nausea or vomiting. 20 tablet 0  . pantoprazole (PROTONIX) 40 MG tablet Take 1 tablet (40 mg total) by mouth 2 (two) times daily. 180 tablet 0   No current facility-administered medications for this visit.    Allergies as of 02/06/2019 - Review Complete 02/06/2019  Allergen Reaction Noted  .  Hydrocodone Nausea And Vomiting 09/12/2006  . Keflex [cephalexin] Nausea And Vomiting and Other (See Comments) 12/29/2018  . Mirabegron Other (See Comments) 09/18/2018  . Milk-related compounds Nausea And Vomiting 05/01/2011    Family History  Problem Relation Age of Onset  . Breast cancer Mother   . Diabetes Mother   . Coronary artery disease Mother   . Kidney disease Mother        renal insufficiency  . Hyperlipidemia Mother   . Diabetes Father   . Coronary artery disease Father   . Colon cancer Father   . Colon cancer Paternal Grandmother   . Breast cancer Maternal Aunt   . Breast cancer Paternal Aunt   . Esophageal cancer Neg Hx   . Rectal cancer Neg Hx   . Stomach cancer Neg Hx     Social History   Socioeconomic History  . Marital status: Married    Spouse name: Not on file  . Number of children: 0  . Years of education: Not on file  . Highest education level: Not on file  Occupational History  . 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 Smoking status: Passive Smoke Exposure - Never Smoker  . Smokeless tobacco: Never Used  . Tobacco comment: husband quit smoking    Substance and Sexual Activity  . Alcohol use: Yes    Alcohol/week: 0.0 standard drinks    Comment: socially twice a month at most or less  . Drug use: No  . Sexual activity: Not on file  Other Topics Concern  . Not on file  Social History Narrative   Daily caffeine use   Social Determinants of Health   Financial Resource Strain:   . Difficulty of Paying Living Expenses: Not on file  Food Insecurity:   . Worried About Charity fundraiser in the Last Year: Not on file  . Ran Out of Food in the Last Year: Not on file  Transportation Needs:   . Lack of Transportation (Medical): Not on file  . Lack of Transportation (Non-Medical): Not on file  Physical Activity:   . Days of Exercise per Week: Not on file  . Minutes of Exercise per Session: Not on file  Stress:   . Feeling of Stress : Not on file  Social Connections:   . Frequency of Communication with Friends and Family: Not on file  . Frequency of Social Gatherings with Friends and Family: Not on file  . Attends Religious Services: Not on file  . Active Member of Clubs or Organizations: Not on file  . Attends Archivist Meetings: Not on file  . Marital Status: Not on file  Intimate Partner Violence:   . Fear of Current or Ex-Partner: Not on file  . Emotionally Abused: Not on file  . Physically Abused: Not on file  . Sexually Abused: Not on file    Review of Systems: 12 system ROS is negative except as noted above.   Physical Exam: General:   Alert,  well-nourished, pleasant and cooperative in NAD Head:  Normocephalic and atraumatic. Eyes:  Sclera clear, no icterus.   Conjunctiva pink. Ears:  Normal auditory acuity. Nose:  No deformity, discharge,  or lesions. Mouth:  No deformity or lesions.   Neck:  Supple; no masses or thyromegaly. Lungs:  Clear throughout to auscultation.   No wheezes. Heart:  Regular rate and rhythm; no murmurs. Abdomen:  Soft,nontender,  nondistended, normal bowel sounds, no rebound or  guarding. No hepatosplenomegaly.   Rectal:  Deferred  Msk:  Symmetrical. No boney deformities LAD: No inguinal or umbilical LAD Extremities:  No clubbing or edema. Neurologic:  Alert and  oriented x4;  grossly nonfocal Skin:  Intact without significant lesions or rashes. Psych:  Alert and cooperative. Normal mood and affect.   Lab Results: Recent Labs    02/04/19 1559  WBC 5.3  HGB 10.9*  HCT 33.4*  PLT 307.0   BMET Recent Labs    02/04/19 1559  NA 140  K 3.9  CL 105  CO2 22  GLUCOSE 138*  BUN 21  CREATININE 1.08  CALCIUM 9.3   LFT Recent Labs    02/04/19 1559  ALBUMIN 4.1     Herberth Deharo L. Tarri Glenn, MD, MPH 02/06/2019, 10:33 AM

## 2019-03-11 ENCOUNTER — Telehealth: Payer: Self-pay

## 2019-03-11 NOTE — Telephone Encounter (Signed)
Continue B12  F/u with me in June with labs prior  Let me know in the meantime if she thinks blood sugars are higher

## 2019-03-11 NOTE — Telephone Encounter (Signed)
Pt had been seeing Dr Glori Bickers every 3 months and B12 injections once a month. She recently had labs done and was never advised as to whether she should keep doing 3 month OVs and monthly B12. Please advise.

## 2019-03-12 LAB — HM DIABETES EYE EXAM

## 2019-03-12 NOTE — Telephone Encounter (Signed)
Left VM requesting pt to call the office back 

## 2019-03-14 ENCOUNTER — Encounter: Payer: Self-pay | Admitting: Family Medicine

## 2019-03-14 NOTE — Telephone Encounter (Signed)
Pt called back, Kelli Cruz scheduled appts

## 2019-03-18 ENCOUNTER — Other Ambulatory Visit: Payer: Self-pay

## 2019-03-18 ENCOUNTER — Ambulatory Visit (INDEPENDENT_AMBULATORY_CARE_PROVIDER_SITE_OTHER): Payer: 59 | Admitting: *Deleted

## 2019-03-18 DIAGNOSIS — E538 Deficiency of other specified B group vitamins: Secondary | ICD-10-CM

## 2019-03-18 MED ORDER — CYANOCOBALAMIN 1000 MCG/ML IJ SOLN
1000.0000 ug | Freq: Once | INTRAMUSCULAR | Status: AC
  Administered 2019-03-18: 1000 ug via INTRAMUSCULAR

## 2019-03-18 NOTE — Progress Notes (Signed)
Per orders of Dr. Tower, injection of Vit B12 given by Sache Sane M. Patient tolerated injection well.  

## 2019-04-22 ENCOUNTER — Ambulatory Visit (INDEPENDENT_AMBULATORY_CARE_PROVIDER_SITE_OTHER): Payer: 59 | Admitting: *Deleted

## 2019-04-22 ENCOUNTER — Other Ambulatory Visit: Payer: Self-pay

## 2019-04-22 DIAGNOSIS — E538 Deficiency of other specified B group vitamins: Secondary | ICD-10-CM

## 2019-04-22 MED ORDER — CYANOCOBALAMIN 1000 MCG/ML IJ SOLN
1000.0000 ug | Freq: Once | INTRAMUSCULAR | Status: AC
  Administered 2019-04-22: 1000 ug via INTRAMUSCULAR

## 2019-04-22 NOTE — Progress Notes (Signed)
Per orders of Dr. Damita Dunnings in Dr Marliss Coots Absence, injection of Vit B12 given by Nyoka Cowden, Wendle Kina M. Patient tolerated injection well.

## 2019-05-12 ENCOUNTER — Telehealth: Payer: Self-pay

## 2019-05-12 NOTE — Telephone Encounter (Signed)
Pt left v/m that she has appt today at [facility] to get covid vaccine and pt wants to make sure it is OK with Dr Glori Bickers to get covid vaccine; pt is diabetic and overweight. I left v/m for pt to cb to [facility]. Pt called back and has appt today at 5:45 pm for covid vaccine. Our providers are recommending the vaccine to all our patients, especially if you are a patient who has medical conditions that make you high-risk for serious COVID disease.   If you have any specific concerns regarding your medical conditions, please let me know.  We will have a provider review your concerns and notify you if you have any potential contraindications.  Otherwise, if you do not hear back from [place], please proceed with scheduling your vaccine when your phase has been activated.    Pt given information above about the covid vaccination recommendations from [facility] and pt said she does not need a cb; pt will go ahead and get the covid vaccine today. Pt wanted to know if our office was suggesting one covid vaccine company over another and I advised no. Pt voiced understanding and said this note could be sent to DR [facility] as Juluis Rainier pt is getting the covid vaccine later today.

## 2019-05-12 NOTE — Telephone Encounter (Signed)
That sounds fine

## 2019-05-13 NOTE — Telephone Encounter (Signed)
Pt notified as instructed and pt voiced understanding and was appreciative. Pt received first Moderna covid vaccine at [facility] in [place] on 05/12/19 and immunization record was updated.

## 2019-06-03 ENCOUNTER — Emergency Department (HOSPITAL_COMMUNITY)
Admission: EM | Admit: 2019-06-03 | Discharge: 2019-06-04 | Disposition: A | Discharge status: Home / Self Care | Payer: 59 | Attending: Emergency Medicine | Admitting: Emergency Medicine

## 2019-06-03 ENCOUNTER — Other Ambulatory Visit: Payer: Self-pay

## 2019-06-03 ENCOUNTER — Encounter (HOSPITAL_COMMUNITY): Payer: Self-pay | Admitting: Emergency Medicine

## 2019-06-03 DIAGNOSIS — E119 Type 2 diabetes mellitus without complications: Secondary | ICD-10-CM | POA: Diagnosis not present

## 2019-06-03 DIAGNOSIS — R1011 Right upper quadrant pain: Secondary | ICD-10-CM | POA: Diagnosis not present

## 2019-06-03 DIAGNOSIS — Z7722 Contact with and (suspected) exposure to environmental tobacco smoke (acute) (chronic): Secondary | ICD-10-CM | POA: Insufficient documentation

## 2019-06-03 DIAGNOSIS — Z79899 Other long term (current) drug therapy: Secondary | ICD-10-CM | POA: Diagnosis not present

## 2019-06-03 DIAGNOSIS — E039 Hypothyroidism, unspecified: Secondary | ICD-10-CM | POA: Insufficient documentation

## 2019-06-03 DIAGNOSIS — F319 Bipolar disorder, unspecified: Secondary | ICD-10-CM | POA: Insufficient documentation

## 2019-06-03 DIAGNOSIS — Z7984 Long term (current) use of oral hypoglycemic drugs: Secondary | ICD-10-CM | POA: Diagnosis not present

## 2019-06-03 DIAGNOSIS — R112 Nausea with vomiting, unspecified: Secondary | ICD-10-CM | POA: Diagnosis not present

## 2019-06-03 DIAGNOSIS — R1010 Upper abdominal pain, unspecified: Secondary | ICD-10-CM

## 2019-06-03 DIAGNOSIS — Z85038 Personal history of other malignant neoplasm of large intestine: Secondary | ICD-10-CM | POA: Insufficient documentation

## 2019-06-03 LAB — COMPREHENSIVE METABOLIC PANEL
ALT: 21 U/L (ref 0–44)
AST: 24 U/L (ref 15–41)
Albumin: 4 g/dL (ref 3.5–5.0)
Alkaline Phosphatase: 78 U/L (ref 38–126)
Anion gap: 15 (ref 5–15)
BUN: 22 mg/dL — ABNORMAL HIGH (ref 6–20)
CO2: 27 mmol/L (ref 22–32)
Calcium: 10.8 mg/dL — ABNORMAL HIGH (ref 8.9–10.3)
Chloride: 95 mmol/L — ABNORMAL LOW (ref 98–111)
Creatinine, Ser: 1.53 mg/dL — ABNORMAL HIGH (ref 0.44–1.00)
GFR calc Af Amer: 44 mL/min — ABNORMAL LOW (ref >60)
GFR calc non Af Amer: 38 mL/min — ABNORMAL LOW (ref >60)
Glucose, Bld: 134 mg/dL — ABNORMAL HIGH (ref 70–99)
Potassium: 4.2 mmol/L (ref 3.5–5.1)
Sodium: 137 mmol/L (ref 135–145)
Total Bilirubin: 0.7 mg/dL (ref 0.3–1.2)
Total Protein: 7.6 g/dL (ref 6.5–8.1)

## 2019-06-03 LAB — URINALYSIS, ROUTINE W REFLEX MICROSCOPIC
Bacteria, UA: NONE SEEN
Bilirubin Urine: NEGATIVE
Glucose, UA: NEGATIVE mg/dL
Hgb urine dipstick: NEGATIVE
Ketones, ur: NEGATIVE mg/dL
Nitrite: NEGATIVE
Protein, ur: 30 mg/dL — AB
Specific Gravity, Urine: 1.026 (ref 1.005–1.030)
pH: 5 (ref 5.0–8.0)

## 2019-06-03 LAB — CBC
HCT: 39.2 % (ref 36.0–46.0)
Hemoglobin: 12.8 g/dL (ref 12.0–15.0)
MCH: 30.9 pg (ref 26.0–34.0)
MCHC: 32.7 g/dL (ref 30.0–36.0)
MCV: 94.7 fL (ref 80.0–100.0)
Platelets: 456 10*3/uL — ABNORMAL HIGH (ref 150–400)
RBC: 4.14 MIL/uL (ref 3.87–5.11)
RDW: 14.4 % (ref 11.5–15.5)
WBC: 10.6 10*3/uL — ABNORMAL HIGH (ref 4.0–10.5)
nRBC: 0 % (ref 0.0–0.2)

## 2019-06-03 LAB — LIPASE, BLOOD: Lipase: 41 U/L (ref 11–51)

## 2019-06-03 NOTE — ED Triage Notes (Signed)
Pt endorses nausea and dry heaves for a week. Endorses right side pain that feels similar to when she had an ulcer.

## 2019-06-04 ENCOUNTER — Emergency Department (HOSPITAL_COMMUNITY): Payer: 59

## 2019-06-04 MED ORDER — SUCRALFATE 1 G PO TABS: 1.0000 g | ORAL_TABLET | Freq: Three times a day (TID) | ORAL | 0 refills | Status: DC

## 2019-06-04 MED ORDER — ONDANSETRON HCL 4 MG/2ML IJ SOLN
4.0000 mg | Freq: Once | INTRAMUSCULAR | Status: AC
  Administered 2019-06-04: 01:00:00 4 mg via INTRAVENOUS
  Filled 2019-06-04: qty 2

## 2019-06-04 MED ORDER — PANTOPRAZOLE SODIUM 40 MG PO TBEC: 40.0000 mg | DELAYED_RELEASE_TABLET | Freq: Two times a day (BID) | ORAL | 0 refills | Status: DC

## 2019-06-04 MED ORDER — SODIUM CHLORIDE 0.9 % IV SOLN
80.0000 mg | Freq: Once | INTRAVENOUS | Status: AC
  Administered 2019-06-04: 80 mg via INTRAVENOUS
  Filled 2019-06-04: qty 80

## 2019-06-04 MED ORDER — SODIUM CHLORIDE 0.9 % IV BOLUS
1000.0000 mL | Freq: Once | INTRAVENOUS | Status: AC
  Administered 2019-06-04: 1000 mL via INTRAVENOUS

## 2019-06-04 MED ORDER — SODIUM CHLORIDE 0.9 % IV SOLN
8.0000 mg/h | INTRAVENOUS | Status: DC
  Administered 2019-06-04: 8 mg/h via INTRAVENOUS
  Filled 2019-06-04: qty 80

## 2019-06-04 MED ORDER — PANTOPRAZOLE SODIUM 40 MG IV SOLR: 40.0000 mg | Freq: Two times a day (BID) | INTRAVENOUS | Status: DC

## 2019-06-04 MED ORDER — MORPHINE SULFATE (PF) 4 MG/ML IV SOLN
4.0000 mg | Freq: Once | INTRAVENOUS | Status: AC
  Administered 2019-06-04: 4 mg via INTRAVENOUS
  Filled 2019-06-04: qty 1

## 2019-06-04 NOTE — ED Notes (Signed)
Patient transported to Ultrasound 

## 2019-06-04 NOTE — Discharge Instructions (Addendum)
Today your blood counts reveal no evidence of significant anemia.  It is possible that your ulcer is returning.  I advise that you resume taking the protonix.  I would like for your to follow-up with your gastroenterologist.  Please also discuss the results of your ultrasound today with Dr. Tarri Glenn, which showed mild fatty liver.

## 2019-06-04 NOTE — ED Provider Notes (Signed)
[facility] EMERGENCY DEPARTMENT Provider Note   CSN: [id number] Arrival date & time: 06/03/19  1639     History Chief Complaint  Patient presents with  . Abdominal Pain    Kelli Cruz is a 59 y.o. female.  Patient presents to the emergency department with a chief complaint of right upper abdominal pain.  She states that she has been having symptoms for the past week.  She reports gradually worsening symptoms.  States that this feels similar to when she had an ulcer in the past.  She states that she had to be admitted in the past due to the ulcer and acute blood loss.  She states that she has had some associated nausea and vomiting.  She is not anticoagulated.  She states that she is no longer taking her Protonix because she was told to only take it for a couple of months.  She denies any alcohol use.  She does still have her gallbladder.  She states that her pain is worse at night.  The history is provided by the patient. No language interpreter was used.       Past Medical History:  Diagnosis Date  . Anemia   . Anxiety   . Arthritis   . Bipolar 1 disorder ([place])   . Cancer [facility])    sigmoid colon cancer  . Depression   . Diabetes mellitus    type II  . Endometriosis   . Family history of malignant neoplasm of gastrointestinal tract   . Ganglion cyst   . GERD (gastroesophageal reflux disease)   . Hyperlipidemia   . Hypothyroidism   . Insomnia due to mental condition     Patient Active Problem List   Diagnosis Date Noted  . Elevated serum creatinine 02/03/2019  . History of GI bleed 12/29/2018  . Acute pyelonephritis 12/29/2018  . Nausea & vomiting 12/29/2018  . Anemia 02/19/2017  . Vitamin D deficiency 02/19/2017  . Knee pain, right 11/10/2016  . Proteinuria 02/18/2016  . Exposure to communicable disease 08/04/2015  . Obesity 02/04/2015  . Hypersomnia with sleep apnea 01/06/2015  . Insomnia due to mental condition   . Tingling 08/12/2014    . Burning sensation of mouth 08/12/2014  . Numbness 10/24/2011  . Routine general medical examination at a health care facility 10/24/2011  . TMJ arthralgia 08/14/2011  . Poor posture 07/26/2011  . B12 deficiency 04/13/2010  . ADENOCARCINOMA, SIGMOID COLON 08/11/2008  . Hyperlipidemia 07/08/2008  . Hypothyroidism 09/12/2006  . Diabetes type 2, controlled ([place]) 09/12/2006  . Depression with anxiety 09/12/2006  . ALLERGIC RHINITIS, SEASONAL 09/12/2006  . FIBROCYSTIC BREAST DISEASE 09/12/2006  . MIGRAINES, HX OF 09/12/2006    Past Surgical History:  Procedure Laterality Date  . ABDOMINAL HYSTERECTOMY    . ABDOMINAL HYSTERECTOMY    . BIOPSY  12/31/2018   Procedure: BIOPSY;  Surgeon: Doran Stabler, MD;  Location: [facility] ENDOSCOPY;  Service: Gastroenterology;;  . COLON RESECTION  June 2010  . COLON SURGERY    . COLONOSCOPY    . DILATION AND CURETTAGE OF UTERUS  2006   miscarriage   . ESOPHAGOGASTRODUODENOSCOPY N/A 12/31/2018   Procedure: ESOPHAGOGASTRODUODENOSCOPY (EGD);  Surgeon: Doran Stabler, MD;  Location: [place];  Service: Gastroenterology;  Laterality: N/A;  . GANGLION CYST EXCISION    . HERNIA REPAIR    . INTERSTIM IMPLANT PLACEMENT Left 12/11/2012   stimulator is on the left but the electrodes go to the right  .  KNEE DISLOCATION SURGERY    . LAPAROSCOPY  1997   D & C for endometriosis     OB History   No obstetric history on file.     Family History  Problem Relation Age of Onset  . Breast cancer Mother   . Diabetes Mother   . Coronary artery disease Mother   . Kidney disease Mother        renal insufficiency  . Hyperlipidemia Mother   . Diabetes Father   . Coronary artery disease Father   . Colon cancer Father   . Colon cancer Paternal Grandmother   . Breast cancer Maternal Aunt   . Breast cancer Paternal Aunt   . Esophageal cancer Neg Hx   . Rectal cancer Neg Hx   . Stomach cancer Neg Hx     Social History   Tobacco Use  . Smoking  status: Passive Smoke Exposure - Never Smoker  . Smokeless tobacco: Never Used  . Tobacco comment: husband quit smoking  Substance Use Topics  . Alcohol use: Yes    Alcohol/week: 0.0 standard drinks    Comment: socially twice a month at most or less  . Drug use: No    Home Medications Prior to Admission medications   Medication Sig Start Date End Date Taking? Authorizing Provider  ALPRAZolam Duanne Moron) 0.5 MG tablet Take 0.5 mg by mouth at bedtime as needed for anxiety (panic attack).     [provider]  amphetamine-dextroamphetamine (ADDERALL XR) 30 MG 24 hr capsule Take 30 mg by mouth daily at 6 (six) AM.     [provider]  amphetamine-dextroamphetamine (ADDERALL) 15 MG tablet Take 15 mg by mouth daily at 2 PM.  07/19/17   [provider]  atorvastatin (LIPITOR) 10 MG tablet Take 1 tablet (10 mg total) by mouth daily. Patient taking differently: Take 10 mg by mouth at bedtime.  09/18/18   Tower, [name], MD  Blood Glucose Monitoring Suppl ([place]) w/Device KIT Check sugar twice daily and as needed. DX 11.9 10/04/18   Tower, [name], MD  cariprazine (VRAYLAR) capsule Take 3 mg by mouth See admin instructions. Take one capsule (3 mg) by mouth every other night    [provider]  Cholecalciferol (VITAMIN D3) 125 MCG (5000 UT) CAPS Take 5,000 Units by mouth daily.    [provider]  citalopram (CELEXA) 40 MG tablet Take 40 mg by mouth at bedtime.  09/18/13   [provider]  cyanocobalamin (,VITAMIN B-12,) 1000 MCG/ML injection Inject 1 mL (1,000 mcg total) into the muscle every 3 (three) months. every 8 weeks Patient taking differently: Inject 1,000 mcg into the muscle every 30 (thirty) days.  03/08/18   Tower, [name], MD  Cyanocobalamin (VITAMIN B-12) 5000 MCG TBDP Take 5,000 mcg by mouth daily.    [provider]  Diabetic Sterile Lancets MISC by Does not apply route. Check glucose two times a day and as needed  for out of control DM 250.00     [provider]  doxepin (SINEQUAN) 50 MG capsule Take 50 mg by mouth at bedtime.  02/05/17   [provider]  gabapentin (NEURONTIN) 300 MG capsule Take 1 capsule (300 mg total) by mouth 2 (two) times daily. 09/18/18   Tower, [name], MD  glipiZIDE (GLUCOTROL XL) 5 MG 24 hr tablet Take 1 tablet (5 mg total) by mouth daily. 09/18/18   Tower, [name], MD  glucose blood (ONETOUCH VERIO) test  strip Strips for one touch verio flex meter. Check sugar twice daily DX E11.9 10/04/18   Tower, Speed A, MD  glucose blood test strip 1 each by Other route. Check glucose two times a day and as needed for out of control DM 250.00     [provider]  levothyroxine (SYNTHROID) 112 MCG tablet Take 1 tablet (112 mcg total) by mouth daily. Patient taking differently: Take 112 mcg by mouth daily before breakfast.  09/18/18   Tower, [name], MD  [facility] FINEPOINT LANCETS MISC Lancets and device for one touch verio flex meter. Use daily. DX 11.9 10/04/18   Tower, Marne A, MD  lisinopril (ZESTRIL) 5 MG tablet Take 0.5 tablets (2.5 mg total) by mouth daily. Patient taking differently: Take 2.5 mg by mouth at bedtime.  09/18/18   Tower, [name], MD  metFORMIN (GLUCOPHAGE) 1000 MG tablet Take 1 tablet (1,000 mg total) by mouth 2 (two) times daily with a meal. 09/18/18   Tower, [name], MD  ondansetron (ZOFRAN ODT) 4 MG disintegrating tablet Take 1 tablet (4 mg total) by mouth every 8 (eight) hours as needed for nausea or vomiting. 12/26/18   Charlann Lange, PA-C  pantoprazole (PROTONIX) 40 MG tablet Take 1 tablet (40 mg total) by mouth 2 (two) times daily. 01/17/19 04/17/19  Tower, [name], MD  cholestyramine Lucrezia Starch) 4 G packet Mix with juice or water and drink every 1-3 days. 11/18/10 05/01/11  Inda Castle, MD    Allergies    Hydrocodone, Keflex [cephalexin], Mirabegron, and Milk-related compounds  Review of Systems   Review of Systems  All other systems reviewed and  are negative.   Physical Exam Updated Vital Signs BP 140/63 (BP Location: Left Arm)   Pulse 89   Temp 98.8 F (37.1 C) (Oral)   Resp 18   Ht '5\' 3"'  (1.6 m)   Wt 83.9 kg   LMP 07/15/2006   SpO2 100%   BMI 32.77 kg/m   Physical Exam Vitals and nursing note reviewed.  Constitutional:      General: She is not in acute distress.    Appearance: She is well-developed.  HENT:     Head: Normocephalic and atraumatic.  Eyes:     Conjunctiva/sclera: Conjunctivae normal.  Cardiovascular:     Rate and Rhythm: Normal rate and regular rhythm.     Heart sounds: No murmur.  Pulmonary:     Effort: Pulmonary effort is normal. No respiratory distress.     Breath sounds: Normal breath sounds.  Abdominal:     Palpations: Abdomen is soft.     Tenderness: There is no abdominal tenderness.     Comments: No Murphy sign No focal abdominal tenderness  Musculoskeletal:        General: Normal range of motion.     Cervical back: Neck supple.  Skin:    General: Skin is warm and dry.  Neurological:     Mental Status: She is alert and oriented to person, place, and time.  Psychiatric:        Mood and Affect: Mood normal.        Behavior: Behavior normal.     ED Results / Procedures / Treatments   Labs (all labs ordered are listed, but only abnormal results are displayed) Labs Reviewed  COMPREHENSIVE METABOLIC PANEL - Abnormal; Notable for the following components:      Result Value   Chloride 95 (*)    Glucose, Bld 134 (*)    BUN  22 (*)    Creatinine, Ser 1.53 (*)    Calcium 10.8 (*)    GFR calc non Af Amer 38 (*)    GFR calc Af Amer 44 (*)    All other components within normal limits  CBC - Abnormal; Notable for the following components:   WBC 10.6 (*)    Platelets 456 (*)    All other components within normal limits  URINALYSIS, ROUTINE W REFLEX MICROSCOPIC - Abnormal; Notable for the following components:   APPearance HAZY (*)    Protein, ur 30 (*)    Leukocytes,Ua TRACE (*)     All other components within normal limits  LIPASE, BLOOD    EKG None  Radiology US Abdomen Limited  Result Date: 06/04/2019 CLINICAL DATA:  Right upper quadrant pain EXAM: ULTRASOUND ABDOMEN LIMITED RIGHT UPPER QUADRANT COMPARISON:  CT 12/29/2018 FINDINGS: Gallbladder: No gallstones or wall thickening visualized. No sonographic Murphy sign noted by sonographer. Common bile duct: Diameter: Upper limits normal diameter at 7 mm Liver: Slight increased echotexture suggesting early fatty infiltration. No focal abnormality or biliary ductal dilatation. Portal vein is patent on color Doppler imaging with normal direction of blood flow towards the liver. Other: None. IMPRESSION: Suspect slight/early fatty infiltration of the liver. No acute findings. Electronically Signed   By: Rolm Baptise M.D.   On: 06/04/2019 01:22    Procedures Procedures (including critical care time)  Medications Ordered in ED Medications  pantoprazole (PROTONIX) 80 mg in sodium chloride 0.9 % 100 mL IVPB (has no administration in time range)  pantoprazole (PROTONIX) 80 mg in sodium chloride 0.9 % 100 mL (0.8 mg/mL) infusion (has no administration in time range)  pantoprazole (PROTONIX) injection 40 mg (has no administration in time range)  morphine 4 MG/ML injection 4 mg (has no administration in time range)  ondansetron (ZOFRAN) injection 4 mg (has no administration in time range)  sodium chloride 0.9 % bolus 1,000 mL (has no administration in time range)    ED Course  I have reviewed the triage vital signs and the nursing notes.  Pertinent labs & imaging results that were available during my care of the patient were reviewed by me and considered in my medical decision making (see chart for details).    MDM Rules/Calculators/A&P                      This patient complains of RUQ abdominal pain for the past week; this involves an extensive number of treatment options, and is a complaint that carries with it a high  risk of complications and morbidity.  The differential diagnosis includes gallbladder disease, hepatitis, kidney stone.  I ordered, reviewed, and interpreted labs, which included CBC for evaluation of anemia based on patient's history.  Her hemoglobin is 12.8, no evidence of anemia.  There is a mild leukocytosis of 10.6, which is nonspecific in this setting as patient does not have any focal abdominal pain.  I doubt surgical or acute abdomen.  CMP is notable for mildly increased creatinine at 1.53 and BUN of 22.  We will give some fluid.  I suspect that patient may be having recurrence of her prior ulcer.  She states that this pain feels similar.  However, at this time I do not feel the patient requires admission to the hospital.  I suspect that she can follow-up on an outpatient basis. I ordered medication Protonix for ulcer. I ordered imaging studies which included right upper quadrant ultrasound and I  independently visualized and interpreted the ultrasound and agree with the radiologist findings of no significant gallbladder wall thickening and no gallstones.  Radiology report suggests slight fatty infiltration of the liver.  I have advised patient to follow-up with her doctor for this. Previous records obtained and reviewed admission in Nov. of 2020 acute GI bleeding   After the interventions stated above, I reevaluated the patient and found stable for discharge with outpatient follow-up.  Will start Protonix and Carafate.  Final Clinical Impression(s) / ED Diagnoses Final diagnoses:  Upper abdominal pain    Rx / DC Orders ED Discharge Orders         Ordered    sucralfate (CARAFATE) 1 g tablet  3 times daily with meals & bedtime     06/04/19 0222    pantoprazole (PROTONIX) 40 MG tablet  2 times daily     06/04/19 0222           Montine Circle, PA-C 06/04/19 0230    Ripley Fraise, MD 06/04/19 [phone]

## 2019-07-22 ENCOUNTER — Other Ambulatory Visit (INDEPENDENT_AMBULATORY_CARE_PROVIDER_SITE_OTHER): Payer: 59

## 2019-07-22 ENCOUNTER — Encounter: Payer: Self-pay | Admitting: Gastroenterology

## 2019-07-22 ENCOUNTER — Ambulatory Visit (INDEPENDENT_AMBULATORY_CARE_PROVIDER_SITE_OTHER): Payer: 59 | Admitting: Gastroenterology

## 2019-07-22 VITALS — BP 118/64 | HR 74 | Ht 63.0 in | Wt 196.0 lb

## 2019-07-22 DIAGNOSIS — R748 Abnormal levels of other serum enzymes: Secondary | ICD-10-CM | POA: Diagnosis not present

## 2019-07-22 DIAGNOSIS — K264 Chronic or unspecified duodenal ulcer with hemorrhage: Secondary | ICD-10-CM

## 2019-07-22 DIAGNOSIS — R1013 Epigastric pain: Secondary | ICD-10-CM

## 2019-07-22 DIAGNOSIS — R932 Abnormal findings on diagnostic imaging of liver and biliary tract: Secondary | ICD-10-CM

## 2019-07-22 DIAGNOSIS — D500 Iron deficiency anemia secondary to blood loss (chronic): Secondary | ICD-10-CM

## 2019-07-22 LAB — IBC + FERRITIN
Ferritin: 33.5 ng/mL (ref 10.0–291.0)
Iron: 63 ug/dL (ref 42–145)
Saturation Ratios: 17.7 % — ABNORMAL LOW (ref 20.0–50.0)
Transferrin: 254 mg/dL (ref 212.0–360.0)

## 2019-07-22 MED ORDER — PANTOPRAZOLE SODIUM 40 MG PO TBEC: 40.0000 mg | DELAYED_RELEASE_TABLET | Freq: Every day | ORAL | 3 refills | Status: DC

## 2019-07-22 NOTE — Patient Instructions (Addendum)
If you are age 57 or younger, your body mass index should be between 19-25. Your Body mass index is 34.72 kg/m. If this is out of the aformentioned range listed, please consider follow up with your Primary Care Provider.   It is recommended that you take OTC iron, avoid taking NSAIDS, Continue taking pantoprazole 40mg  in the morning  Your provider has requested that you go to the basement level for lab work before leaving today. Press "B" on the elevator. The lab is located at the first door on the left as you exit the elevator.  Due to recent changes in healthcare laws, you may see the results of your imaging and laboratory studies on MyChart before your provider has had a chance to review them.  We understand that in some cases there may be results that are confusing or concerning to you. Not all laboratory results come back in the same time frame and the provider may be waiting for multiple results in order to interpret others.  Please give [place] 48 hours in order for your provider to thoroughly review all the results before contacting the office for clarification of your results.

## 2019-07-22 NOTE — Progress Notes (Signed)
Referring Provider: Abner Greenspan, MD Primary Care Physician:  Tower, [name], MD  Chief complaint:  ED follow-up   IMPRESSION:  Recurrent, acute abdominal pain NSAID associated PUD with associated GI bleeding 12/2018 Suspect slight/early fatty infiltration of the liver    - normal AST/ALT Personal history of colon cancer s/p resection 2010 Hyperplastic polyps on colonoscopy 10/09/2018  Symptoms leading up to recent ED visit have resolved. Most recent labs show resolution of anemia.  Patient concerned for recurrent PUD after ED evaluation.  ED doctor mentioned the fatty liver seen on ultrasound. Likely consistent with fatty liver given her concurrent diabetes. Must exclude other causes of hepatocellular inflammation.  Labs today to screen for concurrent disease and NASH FibroSure for assessment of staging.    PLAN: - Continue pantoprazole 58m QAM (3 months with 3 refills) - Continue to avoid NSAIDs - OTC iron recommended until time of EGD, may be able to discontinue at that time - EGD to follow-up on gastric ulcer - Prior labs:  hepatitis B surface antigen, hepatitis B core antibody, fasting ferritin, iron, ANA, AMA, anti-smooth muscle antibody, IgG, IgM, NASH FibroSURE - Follow-up after endoscopy - Colonoscopy 2025 given the personal history of colon cancer   HPI: CHILDUR BAYERis a 59y.o. female known to me for her surveillance colonoscopy 10/09/2018 and her hospitalization for GI bleed due to PUD 12/2018.  She returns now in follow-up after an ER visit with concerns for recurrent PUD.  The interval history is obtained through the patient and review of her electronic health record.  Had the Covid vaccine in April.    She was hospitalized in November 2020 for symptomatic, bleeding duodenal ulcers and erosive gastropathy presenting with melena and GI blood loss anemia.  Pathology results showed gastric antral and oxyntic mucosa with no specific histologic changes.  There was no  H. Pylori.  She had been using significant NSAIDs prior to her hospitalization.   Hemoglobin nadired 12/31/2018 at 7.9.  She was given 2 units of packed red blood cells as well as a Feraheme infusion 11/19.  Hemoglobin on discharge was 8.5. Hemoglobin had increased to 10.9 02/04/19.   Personal history of colon cancer s/p resection 2010 Prior colonoscopy with Dr. KDeatra Ina9/11/15 Most recent colonoscopy 10/09/18: 2 small hyperplastic polyps removed on colonoscopy 10/09/2018  Recurrent symptoms to those she experienced last year leading up to her diagnosis of PUD that occurred after eating BBQ 06/03/19. Seen in the ED. Labs showed hemoglobin of 10.6. Liver enzymes were normal. Ultrasound should suspected fatty liver and was otherwise normal. Symptoms have improved with resumption of PPI. Using Tylenol. Ibuprofen once over the last year for a bad headaches.  No change in bowel habits or blood in the stool.    Past Medical History:  Diagnosis Date  . Anemia   . Anxiety   . Arthritis   . Bipolar 1 disorder ([facility]   . Cancer ([facility]    sigmoid colon cancer  . Depression   . Diabetes mellitus    type II  . Endometriosis   . Family history of malignant neoplasm of gastrointestinal tract   . Ganglion cyst   . GERD (gastroesophageal reflux disease)   . Hyperlipidemia   . Hypothyroidism   . Insomnia due to mental condition     Past Surgical History:  Procedure Laterality Date  . ABDOMINAL HYSTERECTOMY    . ABDOMINAL HYSTERECTOMY    . BIOPSY  12/31/2018   Procedure: BIOPSY;  Surgeon:  Doran Stabler, MD;  Location: [facility] ENDOSCOPY;  Service: Gastroenterology;;  . COLON RESECTION  June 2010  . COLON SURGERY    . COLONOSCOPY    . DILATION AND CURETTAGE OF UTERUS  2006   miscarriage   . ESOPHAGOGASTRODUODENOSCOPY N/A 12/31/2018   Procedure: ESOPHAGOGASTRODUODENOSCOPY (EGD);  Surgeon: Doran Stabler, MD;  Location: [facility];  Service: Gastroenterology;  Laterality: N/A;  . GANGLION CYST  EXCISION    . HERNIA REPAIR    . INTERSTIM IMPLANT PLACEMENT Left 12/11/2012   stimulator is on the left but the electrodes go to the right  . KNEE DISLOCATION SURGERY    . LAPAROSCOPY  1997   D & C for endometriosis    Current Outpatient Medications  Medication Sig Dispense Refill  . ALPRAZolam (XANAX) 0.5 MG tablet Take 0.5 mg by mouth at bedtime as needed for anxiety (panic attack).     Marland Kitchen amphetamine-dextroamphetamine (ADDERALL XR) 30 MG 24 hr capsule Take 30 mg by mouth daily at 6 (six) AM.     . amphetamine-dextroamphetamine (ADDERALL) 15 MG tablet Take 15 mg by mouth daily at 2 PM.     . atorvastatin (LIPITOR) 10 MG tablet Take 1 tablet (10 mg total) by mouth daily. (Patient taking differently: Take 10 mg by mouth at bedtime. ) 90 tablet 3  . Blood Glucose Monitoring Suppl (ONETOUCH VERIO FLEX SYSTEM) w/Device KIT Check sugar twice daily and as needed. DX 11.9 1 kit 12  . cariprazine (VRAYLAR) capsule Take 3 mg by mouth every 3 (three) days.     . Cholecalciferol (VITAMIN D3) 125 MCG (5000 UT) CAPS Take 5,000 Units by mouth daily.    . citalopram (CELEXA) 40 MG tablet Take 40 mg by mouth at bedtime.     . cyanocobalamin (,VITAMIN B-12,) 1000 MCG/ML injection Inject 1 mL (1,000 mcg total) into the muscle every 3 (three) months. every 8 weeks (Patient taking differently: Inject 1,000 mcg into the muscle every 30 (thirty) days. ) 1 mL 5  . Cyanocobalamin (VITAMIN B-12) 5000 MCG TBDP Take 5,000 mcg by mouth daily.    . Diabetic Sterile Lancets MISC by Does not apply route. Check glucose two times a day and as needed for out of control DM 250.00     . doxepin (SINEQUAN) 50 MG capsule Take 50 mg by mouth at bedtime.   1  . gabapentin (NEURONTIN) 300 MG capsule Take 1 capsule (300 mg total) by mouth 2 (two) times daily. 180 capsule 3  . glipiZIDE (GLUCOTROL XL) 5 MG 24 hr tablet Take 1 tablet (5 mg total) by mouth daily. 90 tablet 3  . glucose blood (ONETOUCH VERIO) test strip Strips for one  touch verio flex meter. Check sugar twice daily DX E11.9 200 each 12  . glucose blood test strip 1 each by Other route. Check glucose two times a day and as needed for out of control DM 250.00     . levothyroxine (SYNTHROID) 112 MCG tablet Take 1 tablet (112 mcg total) by mouth daily. (Patient taking differently: Take 112 mcg by mouth daily before breakfast. ) 90 tablet 3  . LIFESCAN FINEPOINT LANCETS MISC Lancets and device for one touch verio flex meter. Use daily. DX 11.9 200 each 12  . lisinopril (ZESTRIL) 5 MG tablet Take 0.5 tablets (2.5 mg total) by mouth daily. (Patient taking differently: Take 2.5 mg by mouth at bedtime. ) 45 tablet 3  . metFORMIN (GLUCOPHAGE) 1000 MG tablet Take 1 tablet (  1,000 mg total) by mouth 2 (two) times daily with a meal. 180 tablet 3  . ondansetron (ZOFRAN ODT) 4 MG disintegrating tablet Take 1 tablet (4 mg total) by mouth every 8 (eight) hours as needed for nausea or vomiting. 20 tablet 0  . pantoprazole (PROTONIX) 40 MG tablet Take 1 tablet (40 mg total) by mouth 2 (two) times daily. 180 tablet 0  . trimethoprim (TRIMPEX) 100 MG tablet Take 100 mg by mouth daily.    . sucralfate (CARAFATE) 1 g tablet Take 1 tablet (1 g total) by mouth 4 (four) times daily -  with meals and at bedtime. (Patient not taking: Reported on 07/22/2019) 120 tablet 0   No current facility-administered medications for this visit.    Allergies as of 07/22/2019 - Review Complete 07/22/2019  Allergen Reaction Noted  . Hydrocodone Nausea And Vomiting 09/12/2006  . Keflex [cephalexin] Nausea And Vomiting and Other (See Comments) 12/29/2018  . Mirabegron Other (See Comments) 09/18/2018  . Milk-related compounds Nausea And Vomiting 05/01/2011    Family History  Problem Relation Age of Onset  . Breast cancer Mother   . Diabetes Mother   . Coronary artery disease Mother   . Kidney disease Mother        renal insufficiency  . Hyperlipidemia Mother   . Diabetes Father   . Coronary artery  disease Father   . Colon cancer Father   . Colon cancer Paternal Grandmother   . Breast cancer Maternal Aunt   . Breast cancer Paternal Aunt   . Esophageal cancer Neg Hx   . Rectal cancer Neg Hx   . Stomach cancer Neg Hx     Social History   Socioeconomic History  . Marital status: Married    Spouse name: Not on file  . Number of children: 0  . Years of education: Not on file  . Highest education level: Not on file  Occupational History  . 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 Smoking status: Passive Smoke Exposure - Never Smoker  . Smokeless tobacco: Never Used  . Tobacco comment: husband quit smoking  Substance and Sexual Activity  . Alcohol use: Yes    Alcohol/week: 0.0 standard drinks    Comment: socially twice a month at most or less  . Drug use: No  . Sexual activity: Not on file  Other Topics Concern  . Not on file  Social History Narrative   Daily caffeine use   Social Determinants of Health   Financial Resource Strain:   . Difficulty of Paying Living Expenses:   Food Insecurity:   . Worried About Charity fundraiser in the Last Year:   . Arboriculturist in the Last Year:   Transportation Needs:   . Film/video editor (Medical):   Marland Kitchen Lack of Transportation (Non-Medical):   Physical Activity:   . Days of Exercise per Week:   . Minutes of Exercise per Session:   Stress:   . Feeling of Stress :   Social Connections:   . Frequency of Communication with Friends and Family:   . Frequency of Social Gatherings with Friends and Family:   . Attends Religious Services:   . Active Member of Clubs or Organizations:   . Attends Archivist Meetings:   Marland Kitchen Marital Status:   Intimate Partner Violence:   . Fear of Current or Ex-Partner:   . Emotionally  Abused:   Marland Kitchen Physically Abused:   . Sexually Abused:      Physical Exam: General:   Alert,  well-nourished, pleasant and  cooperative in NAD Head:  Normocephalic and atraumatic. Eyes:  Sclera clear, no icterus.   Conjunctiva pink. Abdomen:  Soft, nontender, nondistended, normal bowel sounds, no rebound or guarding. No hepatosplenomegaly.   Rectal:  Deferred  Msk:  Symmetrical. No boney deformities LAD: No inguinal or umbilical LAD Extremities:  No clubbing or edema. Neurologic:  Alert and  oriented x4;  grossly nonfocal Skin:  Intact without significant lesions or rashes. Psych:  Alert and cooperative. Normal mood and affect.  Cheyanne Lamison L. Tarri Glenn, MD, MPH 07/22/2019, 3:58 PM

## 2019-07-24 ENCOUNTER — Telehealth: Payer: Self-pay

## 2019-07-24 LAB — NASH FIBROSURE
ALPHA 2-MACROGLOBULINS, QN: 236 mg/dL (ref 110–276)
ALT (SGPT) P5P: 16 IU/L (ref 0–40)
AST (SGOT) P5P: 21 IU/L (ref 0–40)
Apolipoprotein A-1: 144 mg/dL (ref 116–209)
Bilirubin, Total: 0.2 mg/dL (ref 0.0–1.2)
Cholesterol, Total: 202 mg/dL — ABNORMAL HIGH (ref 100–199)
Fibrosis Score: 0.08 (ref 0.00–0.21)
GGT: 16 IU/L (ref 0–60)
Glucose: 205 mg/dL — ABNORMAL HIGH (ref 65–99)
Haptoglobin: 278 mg/dL (ref 33–346)
Height: 63 in
NASH Score: 0.75 — ABNORMAL HIGH
Steatosis Score: 0.68 — ABNORMAL HIGH (ref 0.00–0.30)
Triglycerides: 290 mg/dL — ABNORMAL HIGH (ref 0–149)
Weight: 196 [lb_av]

## 2019-07-24 NOTE — Telephone Encounter (Signed)
-----   Message from Thornton Park, MD sent at 07/24/2019 12:31 PM EDT ----- Please call the patient. FibroSURE suggests fatty liver but there is no scarring or damage. We will be able to discuss these results during her next office visit. Please arrange for office visit after her upcoming endoscopy. Thank you.

## 2019-07-26 LAB — HEPATITIS B SURFACE ANTIBODY,QUALITATIVE: Hep B S Ab: NONREACTIVE

## 2019-07-26 LAB — HEPATITIS B CORE ANTIBODY, TOTAL: Hep B Core Total Ab: NONREACTIVE

## 2019-07-26 LAB — ANA: Anti Nuclear Antibody (ANA): NEGATIVE

## 2019-07-26 LAB — ANTI-SMOOTH MUSCLE ANTIBODY, IGG: Actin (Smooth Muscle) Antibody (IGG): <20 U (ref <20)

## 2019-07-26 LAB — IGG: IgG (Immunoglobin G), Serum: 880 mg/dL (ref 600–1640)

## 2019-07-26 LAB — MITOCHONDRIAL ANTIBODIES: Mitochondrial M2 Ab, IgG: <=20 U

## 2019-08-01 ENCOUNTER — Other Ambulatory Visit: Payer: Self-pay

## 2019-08-01 ENCOUNTER — Ambulatory Visit (AMBULATORY_SURGERY_CENTER): Payer: 59 | Admitting: Gastroenterology

## 2019-08-01 ENCOUNTER — Other Ambulatory Visit: Payer: Self-pay | Admitting: Gastroenterology

## 2019-08-01 ENCOUNTER — Encounter: Payer: Self-pay | Admitting: Gastroenterology

## 2019-08-01 VITALS — BP 125/66 | HR 70 | Temp 96.8°F | Resp 10 | Ht 63.0 in | Wt 196.0 lb

## 2019-08-01 DIAGNOSIS — K2970 Gastritis, unspecified, without bleeding: Secondary | ICD-10-CM | POA: Diagnosis present

## 2019-08-01 DIAGNOSIS — R1013 Epigastric pain: Secondary | ICD-10-CM

## 2019-08-01 DIAGNOSIS — K3189 Other diseases of stomach and duodenum: Secondary | ICD-10-CM

## 2019-08-01 MED ORDER — SODIUM CHLORIDE 0.9 % IV SOLN: 500.0000 mL | INTRAVENOUS | Status: DC

## 2019-08-01 NOTE — Patient Instructions (Signed)
YOU HAD AN ENDOSCOPIC PROCEDURE TODAY AT THE [facility]:   Refer to the procedure report that was given to you for any specific questions about what was found during the examination.  If the procedure report does not answer your questions, please call your gastroenterologist to clarify.  If you requested that your care partner not be given the details of your procedure findings, then the procedure report has been included in a sealed envelope for you to review at your convenience later.  YOU SHOULD EXPECT: Some feelings of bloating in the abdomen. Passage of more gas than usual.  Walking can help get rid of the air that was put into your GI tract during the procedure and reduce the bloating. If you had a lower endoscopy (such as a colonoscopy or flexible sigmoidoscopy) you may notice spotting of blood in your stool or on the toilet paper. If you underwent a bowel prep for your procedure, you may not have a normal bowel movement for a few days.  Please Note:  You might notice some irritation and congestion in your nose or some drainage.  This is from the oxygen used during your procedure.  There is no need for concern and it should clear up in a day or so.  SYMPTOMS TO REPORT IMMEDIATELY:    Following upper endoscopy (EGD)  Vomiting of blood or coffee ground material  New chest pain or pain under the shoulder blades  Painful or persistently difficult swallowing  New shortness of breath  Fever of 100F or higher  Black, tarry-looking stools  For urgent or emergent issues, a gastroenterologist can be reached at any hour by calling [phone]. Do not use MyChart messaging for urgent concerns.    DIET:  We do recommend a small meal at first, but then you may proceed to your regular diet.  Drink plenty of fluids but you should avoid alcoholic beverages for 24 hours.  ACTIVITY:  You should plan to take it easy for the rest of today and you should NOT DRIVE or use heavy machinery  until tomorrow (because of the sedation medicines used during the test).    FOLLOW UP: Our staff will call the number listed on your records 48-72 hours following your procedure to check on you and address any questions or concerns that you may have regarding the information given to you following your procedure. If we do not reach you, we will leave a message.  We will attempt to reach you two times.  During this call, we will ask if you have developed any symptoms of COVID 19. If you develop any symptoms (ie: fever, flu-like symptoms, shortness of breath, cough etc.) before then, please call [phone].  If you test positive for Covid 19 in the 2 weeks post procedure, please call and report this information to us.    If any biopsies were taken you will be contacted by phone or by letter within the next 1-3 weeks.  Please call us at [phone] if you have not heard about the biopsies in 3 weeks.    SIGNATURES/CONFIDENTIALITY: You and/or your care partner have signed paperwork which will be entered into your electronic medical record.  These signatures attest to the fact that that the information above on your After Visit Summary has been reviewed and is understood.  Full responsibility of the confidentiality of this discharge information lies with you and/or your care-partner. 

## 2019-08-01 NOTE — Progress Notes (Signed)
A/ox3, pleased with MAC, report to RN 

## 2019-08-01 NOTE — Progress Notes (Signed)
Vs cw  

## 2019-08-01 NOTE — Op Note (Signed)
[facility] Patient Name: Kelli Cruz Procedure Date: 08/01/2019 3:28 PM MRN: [id number] Endoscopist: Thornton Park MD, MD Age: 59 Referring MD:  Date of Birth: March 31, 1960 Gender: Female Account #: [id number] Procedure:                Upper GI endoscopy Indications:              Recurrent, acute abdominal pain                           NSAID associated PUD with associated GI bleeding                            12/2018 Medicines:                Monitored Anesthesia Care Procedure:                Pre-Anesthesia Assessment:                           - Prior to the procedure, a History and Physical                            was performed, and patient medications and                            allergies were reviewed. [name] tolerance of                            previous anesthesia was also reviewed. The risks                            and benefits of the procedure and the sedation                            options and risks were discussed with the patient.                            All questions were answered, and informed consent                            was obtained. Prior Anticoagulants: The patient has                            taken no previous anticoagulant or antiplatelet                            agents. ASA Grade Assessment: II - A patient with                            mild systemic disease. After reviewing the risks                            and benefits, the patient was deemed in  satisfactory condition to undergo the procedure.                           After obtaining informed consent, the endoscope was                            passed under direct vision. Throughout the                            procedure, the patient's blood pressure, pulse, and                            oxygen saturations were monitored continuously. The                            Endoscope was introduced through the mouth, and                             advanced to the third part of duodenum. The upper                            GI endoscopy was accomplished without difficulty.                            The patient tolerated the procedure well. Scope In: Scope Out: Findings:                 The examined esophagus was normal.                           There is mild erythema in the stomach. Biopsies                            were taken from the antrum, body, and fundus with a                            cold forceps for histology. Estimated blood loss                            was minimal.                           The examined duodenum was normal. Complications:            No immediate complications. Estimated Blood Loss:     Estimated blood loss was minimal. Impression:               - Normal esophagus.                           - Mild gastritis. No recurrent peptic ulcer                            disease. Biopsied.                           -  Normal examined duodenum. Biopsied. Recommendation:           - Patient has a contact number available for                            emergencies. The signs and symptoms of potential                            delayed complications were discussed with the                            patient. Return to normal activities tomorrow.                            Written discharge instructions were provided to the                            patient.                           - Resume previous diet.                           - Continue present medications.                           - Await pathology results.                           - Avoid NSAIDs. Thornton Park MD, MD 08/01/2019 4:02:07 PM This report has been signed electronically.

## 2019-08-05 ENCOUNTER — Telehealth: Payer: Self-pay

## 2019-08-05 ENCOUNTER — Telehealth: Payer: Self-pay | Admitting: *Deleted

## 2019-08-05 NOTE — Telephone Encounter (Signed)
Attempted to reach pt. With follow-up call following endoscopic procedure 08/01/2019.  Unable to LM as pt.'s voice mailbox was full.  Will try to reach pt. Again later today.

## 2019-08-05 NOTE — Telephone Encounter (Signed)
Pt is returning phone call, she states she is doing fine and will keep her appt on 09/05/19

## 2019-08-05 NOTE — Telephone Encounter (Signed)
  Follow up Call-  Call back number 08/01/2019 10/09/2018  Post procedure Call Back phone  # [phone]  Permission to leave phone message Yes Yes  Some recent data might be hidden     Patient questions:  Do you have a fever, pain , or abdominal swelling? No. Pain Score  0 *  Have you tolerated food without any problems? Yes.    Have you been able to return to your normal activities? Yes.    Do you have any questions about your discharge instructions: Diet   No. Medications  No. Follow up visit  No.  Do you have questions or concerns about your Care? No.  Actions: * If pain score is 4 or above: No action needed, pain <4.  1. Have you developed a fever since your procedure? no  2.   Have you had an respiratory symptoms (SOB or cough) since your procedure? no  3.   Have you tested positive for COVID 19 since your procedure no  4.   Have you had any family members/close contacts diagnosed with the COVID 19 since your procedure?  no   If yes to any of these questions please route to Joylene John, RN and Erenest Rasher, RN

## 2019-08-06 ENCOUNTER — Telehealth: Payer: Self-pay | Admitting: Family Medicine

## 2019-08-06 DIAGNOSIS — D500 Iron deficiency anemia secondary to blood loss (chronic): Secondary | ICD-10-CM

## 2019-08-06 DIAGNOSIS — E039 Hypothyroidism, unspecified: Secondary | ICD-10-CM

## 2019-08-06 DIAGNOSIS — E119 Type 2 diabetes mellitus without complications: Secondary | ICD-10-CM

## 2019-08-06 DIAGNOSIS — R7989 Other specified abnormal findings of blood chemistry: Secondary | ICD-10-CM

## 2019-08-06 NOTE — Telephone Encounter (Signed)
-----   Message from Ellamae Sia sent at 07/23/2019  3:06 PM EDT ----- Regarding: Lab orders for Thursday, 6.24.21 Lab orders for f/u

## 2019-08-07 ENCOUNTER — Other Ambulatory Visit: Payer: BC Managed Care – PPO

## 2019-08-08 ENCOUNTER — Encounter: Payer: Self-pay | Admitting: Gastroenterology

## 2019-08-12 ENCOUNTER — Ambulatory Visit: Payer: BC Managed Care – PPO | Admitting: Family Medicine

## 2019-08-13 ENCOUNTER — Other Ambulatory Visit (INDEPENDENT_AMBULATORY_CARE_PROVIDER_SITE_OTHER): Payer: 59

## 2019-08-13 DIAGNOSIS — E039 Hypothyroidism, unspecified: Secondary | ICD-10-CM

## 2019-08-13 DIAGNOSIS — E119 Type 2 diabetes mellitus without complications: Secondary | ICD-10-CM

## 2019-08-13 DIAGNOSIS — R7989 Other specified abnormal findings of blood chemistry: Secondary | ICD-10-CM

## 2019-08-13 DIAGNOSIS — D500 Iron deficiency anemia secondary to blood loss (chronic): Secondary | ICD-10-CM

## 2019-08-14 LAB — BASIC METABOLIC PANEL
BUN: 27 mg/dL — ABNORMAL HIGH (ref 6–23)
CO2: 25 mEq/L (ref 19–32)
Calcium: 10.4 mg/dL (ref 8.4–10.5)
Chloride: 103 mEq/L (ref 96–112)
Creatinine, Ser: 1.69 mg/dL — ABNORMAL HIGH (ref 0.40–1.20)
GFR: 31.23 mL/min — ABNORMAL LOW (ref >60.00)
Glucose, Bld: 91 mg/dL (ref 70–99)
Potassium: 4.8 mEq/L (ref 3.5–5.1)
Sodium: 140 mEq/L (ref 135–145)

## 2019-08-14 LAB — CBC WITH DIFFERENTIAL/PLATELET
Basophils Absolute: 0.1 10*3/uL (ref 0.0–0.1)
Basophils Relative: 1.5 % (ref 0.0–3.0)
Eosinophils Absolute: 0.2 10*3/uL (ref 0.0–0.7)
Eosinophils Relative: 2.4 % (ref 0.0–5.0)
HCT: 36.1 % (ref 36.0–46.0)
Hemoglobin: 11.9 g/dL — ABNORMAL LOW (ref 12.0–15.0)
Lymphocytes Relative: 29.3 % (ref 12.0–46.0)
Lymphs Abs: 2.7 10*3/uL (ref 0.7–4.0)
MCHC: 32.9 g/dL (ref 30.0–36.0)
MCV: 96.6 fl (ref 78.0–100.0)
Monocytes Absolute: 0.4 10*3/uL (ref 0.1–1.0)
Monocytes Relative: 4.2 % (ref 3.0–12.0)
Neutro Abs: 5.7 10*3/uL (ref 1.4–7.7)
Neutrophils Relative %: 62.6 % (ref 43.0–77.0)
Platelets: 377 10*3/uL (ref 150.0–400.0)
RBC: 3.74 Mil/uL — ABNORMAL LOW (ref 3.87–5.11)
RDW: 14.5 % (ref 11.5–15.5)
WBC: 9.1 10*3/uL (ref 4.0–10.5)

## 2019-08-14 LAB — TSH: TSH: 9.48 u[IU]/mL — ABNORMAL HIGH (ref 0.35–4.50)

## 2019-08-14 LAB — HEMOGLOBIN A1C: Hgb A1c MFr Bld: 7 % — ABNORMAL HIGH (ref 4.6–6.5)

## 2019-08-15 ENCOUNTER — Other Ambulatory Visit: Payer: Self-pay

## 2019-08-15 ENCOUNTER — Ambulatory Visit (INDEPENDENT_AMBULATORY_CARE_PROVIDER_SITE_OTHER): Payer: 59 | Admitting: Family Medicine

## 2019-08-15 ENCOUNTER — Encounter: Payer: Self-pay | Admitting: Family Medicine

## 2019-08-15 VITALS — BP 118/78 | HR 83 | Temp 97.1°F | Wt 194.0 lb

## 2019-08-15 DIAGNOSIS — B070 Plantar wart: Secondary | ICD-10-CM | POA: Insufficient documentation

## 2019-08-15 DIAGNOSIS — Z6834 Body mass index (BMI) 34.0-34.9, adult: Secondary | ICD-10-CM

## 2019-08-15 DIAGNOSIS — R252 Cramp and spasm: Secondary | ICD-10-CM | POA: Diagnosis not present

## 2019-08-15 DIAGNOSIS — C187 Malignant neoplasm of sigmoid colon: Secondary | ICD-10-CM

## 2019-08-15 DIAGNOSIS — R7989 Other specified abnormal findings of blood chemistry: Secondary | ICD-10-CM

## 2019-08-15 DIAGNOSIS — E119 Type 2 diabetes mellitus without complications: Secondary | ICD-10-CM | POA: Diagnosis not present

## 2019-08-15 DIAGNOSIS — Z8719 Personal history of other diseases of the digestive system: Secondary | ICD-10-CM

## 2019-08-15 DIAGNOSIS — E6609 Other obesity due to excess calories: Secondary | ICD-10-CM

## 2019-08-15 DIAGNOSIS — E538 Deficiency of other specified B group vitamins: Secondary | ICD-10-CM | POA: Diagnosis not present

## 2019-08-15 DIAGNOSIS — E039 Hypothyroidism, unspecified: Secondary | ICD-10-CM

## 2019-08-15 MED ORDER — CYANOCOBALAMIN 1000 MCG/ML IJ SOLN
1000.0000 ug | Freq: Once | INTRAMUSCULAR | Status: AC
  Administered 2019-08-15: 1000 ug via INTRAMUSCULAR

## 2019-08-15 MED ORDER — LEVOTHYROXINE SODIUM 125 MCG PO TABS
125.0000 ug | ORAL_TABLET | Freq: Every day | ORAL | 3 refills | Status: DC
Start: 2019-08-15 — End: 2020-04-09

## 2019-08-15 NOTE — Assessment & Plan Note (Signed)
Doing better  Sees GI  Avoids nsaids  Cbc reviewed , taking iron

## 2019-08-15 NOTE — Patient Instructions (Addendum)
Tumeric (like in mustard) is helpful for cramps  Also magnesium 250 to 500 mg daily helps as well (it can loosen stool so be cautious)  Also stretching especially before bed   Increase your fluids to 64 oz per day  Your kidney numbers are up  Follow up with your kidney doctor when able   A1C is up  Cut portions back down Try to get most of your carbohydrates from produce (with the exception of white potatoes)  Eat less bread/pasta/rice/snack foods/cereals/sweets and other items from the middle of the grocery store (processed carbs)  Add back exercise - use your equipment in evening  Aim for 30 or more minutes per day   Follow up in 3 months   Go up on thyroid dose to 125 mcg

## 2019-08-15 NOTE — Assessment & Plan Note (Signed)
Reassuring labs Enc strongly to drink more fluids - 64 oz per day  Also trial of mustard (tumeric) and magnesium prn as well as stretching

## 2019-08-15 NOTE — Assessment & Plan Note (Signed)
After consent obt- debrided with #15 scalpel/ some relief inst to use compound W or other otc prep to treat  If no imp would consider cryo tx  Using caution in light of diabetes

## 2019-08-15 NOTE — Assessment & Plan Note (Signed)
B12 shot today. 

## 2019-08-15 NOTE — Progress Notes (Signed)
Subjective:    Patient ID: Kelli Cruz, female    DOB: Jun 06, 1960, 59 y.o.   MRN: [id number]  This visit occurred during the SARS-CoV-2 public health emergency.  Safety protocols were in place, including screening questions prior to the visit, additional usage of staff PPE, and extensive cleaning of exam room while observing appropriate contact time as indicated for disinfecting solutions.    HPI Pt presents for f/u and leg cramps   Wt Readings from Last 3 Encounters:  08/15/19 194 lb (88 kg)  08/01/19 196 lb (88.9 kg)  07/22/19 196 lb (88.9 kg)   34.37 kg/m   Feeling fair  Leg cramps are worse- esp in lateral calf and toes draw up  Has them all the time and worse at night  Fluid intake is not optimal   Diet is fair - some veggies more than fruits   BP Readings from Last 3 Encounters:  08/15/19 118/78  08/01/19 125/66  07/22/19 118/64   Pulse Readings from Last 3 Encounters:  08/15/19 83  08/01/19 70  07/22/19 74   She sees renal doctor in [place] -proteinuria  Lab Results  Component Value Date   CREATININE 1.69 (H) 08/13/2019   BUN 27 (H) 08/13/2019   NA 140 08/13/2019   K 4.8 08/13/2019   CL 103 08/13/2019   CO2 25 08/13/2019  GFR 31.2  This is down No nsaids  Tylenol    Hypothyroid Lab Results  Component Value Date   TSH 9.48 (H) 08/13/2019    This is up from 5.7 last time  Was taking her levothy with supplements and changed that  Takes levothy 112 mcg  Has been sluggish and tired   DM 2 On ace and statin  Lab Results  Component Value Date   HGBA1C 7.0 (H) 08/13/2019   This is up significantly from 5.9  Diet -has been eating more (bigger portions)  Exercise - not a lot in the past 6 mo  Would like to start using bike and [employer]   Lipids Lab Results  Component Value Date   CHOL 202 (H) 07/22/2019   HDL 42.30 09/16/2018   LDLCALC 75 03/08/2018   LDLDIRECT 105.0 09/16/2018   TRIG 290 (H) 07/22/2019   CHOLHDL 4 09/16/2018    statin   Anemia  H/o GI bleed last year with 2 u transfusion Lab Results  Component Value Date   WBC 9.1 08/13/2019   HGB 11.9 (L) 08/13/2019   HCT 36.1 08/13/2019   MCV 96.6 08/13/2019   PLT 377.0 08/13/2019   Lab Results  Component Value Date   FERRITIN 33.5 07/22/2019   Lab Results  Component Value Date   VITAMINB12 204 (L) 09/16/2018  no B12 shot since April    Patient Active Problem List   Diagnosis Date Noted  . Leg cramps 08/15/2019  . Plantar wart of right foot 08/15/2019  . Elevated serum creatinine 02/03/2019  . History of GI bleed 12/29/2018  . Anemia 02/19/2017  . Vitamin D deficiency 02/19/2017  . Knee pain, right 11/10/2016  . Proteinuria 02/18/2016  . Exposure to communicable disease 08/04/2015  . Obesity 02/04/2015  . Hypersomnia with sleep apnea 01/06/2015  . Insomnia due to mental condition   . Tingling 08/12/2014  . Burning sensation of mouth 08/12/2014  . Numbness 10/24/2011  . Routine general medical examination at a health care facility 10/24/2011  . TMJ arthralgia 08/14/2011  . Poor posture 07/26/2011  . B12 deficiency 04/13/2010  .  ADENOCARCINOMA, SIGMOID COLON 08/11/2008  . Hyperlipidemia 07/08/2008  . Hypothyroidism 09/12/2006  . Diabetes type 2, controlled ([place]) 09/12/2006  . Depression with anxiety 09/12/2006  . ALLERGIC RHINITIS, SEASONAL 09/12/2006  . FIBROCYSTIC BREAST DISEASE 09/12/2006  . MIGRAINES, HX OF 09/12/2006   Past Medical History:  Diagnosis Date  . Anemia   . Anxiety   . Arthritis   . Bipolar 1 disorder ([place])   . Cancer [facility])    sigmoid colon cancer  . Depression   . Diabetes mellitus    type II  . Endometriosis   . Family history of malignant neoplasm of gastrointestinal tract   . Ganglion cyst   . GERD (gastroesophageal reflux disease)   . Hyperlipidemia   . Hypothyroidism   . Insomnia due to mental condition    Past Surgical History:  Procedure Laterality Date  . ABDOMINAL HYSTERECTOMY    .  ABDOMINAL HYSTERECTOMY    . BIOPSY  12/31/2018   Procedure: BIOPSY;  Surgeon: Doran Stabler, MD;  Location: [facility] ENDOSCOPY;  Service: Gastroenterology;;  . COLON RESECTION  June 2010  . COLON SURGERY    . COLONOSCOPY    . DILATION AND CURETTAGE OF UTERUS  2006   miscarriage   . ESOPHAGOGASTRODUODENOSCOPY N/A 12/31/2018   Procedure: ESOPHAGOGASTRODUODENOSCOPY (EGD);  Surgeon: Doran Stabler, MD;  Location: [facility];  Service: Gastroenterology;  Laterality: N/A;  . GANGLION CYST EXCISION    . HERNIA REPAIR    . INTERSTIM IMPLANT PLACEMENT Left 12/11/2012   stimulator is on the left but the electrodes go to the right  . KNEE DISLOCATION SURGERY    . LAPAROSCOPY  1997   D & C for endometriosis   Social History   Tobacco Use  . Smoking status: Passive Smoke Exposure - Never Smoker  . Smokeless tobacco: Never Used  . Tobacco comment: husband quit smoking  Vaping Use  . Vaping Use: Never used  Substance Use Topics  . Alcohol use: Yes    Alcohol/week: 0.0 standard drinks    Comment: socially twice a month at most or less  . Drug use: No   Family History  Problem Relation Age of Onset  . Breast cancer Mother   . Diabetes Mother   . Coronary artery disease Mother   . Kidney disease Mother        renal insufficiency  . Hyperlipidemia Mother   . Diabetes Father   . Coronary artery disease Father   . Colon cancer Father   . Colon cancer Paternal Grandmother   . Breast cancer Maternal Aunt   . Breast cancer Paternal Aunt   . Esophageal cancer Neg Hx   . Rectal cancer Neg Hx   . Stomach cancer Neg Hx    Allergies  Allergen Reactions  . Hydrocodone Nausea And Vomiting    No reaction if taken with enough food  . Keflex [Cephalexin] Nausea And Vomiting and Other (See Comments)    Light-headed/dizziness/weakness  . Mirabegron Other (See Comments)    Trouble breathing and swollen tongue  . Milk-Related Compounds Nausea And Vomiting   Current Outpatient Medications  on File Prior to Visit  Medication Sig Dispense Refill  . ALPRAZolam (XANAX) 0.5 MG tablet Take 0.5 mg by mouth at bedtime as needed for anxiety (panic attack).     Marland Kitchen amphetamine-dextroamphetamine (ADDERALL XR) 30 MG 24 hr capsule Take 30 mg by mouth daily at 6 (six) AM.     . amphetamine-dextroamphetamine (ADDERALL) 15 MG tablet  Take 15 mg by mouth daily at 2 PM.     . atorvastatin (LIPITOR) 10 MG tablet Take 1 tablet (10 mg total) by mouth daily. (Patient taking differently: Take 10 mg by mouth at bedtime. ) 90 tablet 3  . Blood Glucose Monitoring Suppl (ONETOUCH VERIO FLEX SYSTEM) w/Device KIT Check sugar twice daily and as needed. DX 11.9 1 kit 12  . cariprazine (VRAYLAR) capsule Take 3 mg by mouth every 3 (three) days.     . Cholecalciferol (VITAMIN D3) 125 MCG (5000 UT) CAPS Take 5,000 Units by mouth daily.    . citalopram (CELEXA) 40 MG tablet Take 40 mg by mouth at bedtime.     . cyanocobalamin (,VITAMIN B-12,) 1000 MCG/ML injection Inject 1 mL (1,000 mcg total) into the muscle every 3 (three) months. every 8 weeks 1 mL 5  . Cyanocobalamin (VITAMIN B-12) 5000 MCG TBDP Take 5,000 mcg by mouth daily.    . Diabetic Sterile Lancets MISC by Does not apply route. Check glucose two times a day and as needed for out of control DM 250.00     . doxepin (SINEQUAN) 50 MG capsule Take 50 mg by mouth at bedtime.   1  . ferrous sulfate 325 (65 FE) MG tablet Take 325 mg by mouth daily with breakfast.    . gabapentin (NEURONTIN) 300 MG capsule Take 1 capsule (300 mg total) by mouth 2 (two) times daily. 180 capsule 3  . glipiZIDE (GLUCOTROL XL) 5 MG 24 hr tablet Take 1 tablet (5 mg total) by mouth daily. 90 tablet 3  . glucose blood (ONETOUCH VERIO) test strip Strips for one touch verio flex meter. Check sugar twice daily DX E11.9 200 each 12  . glucose blood test strip 1 each by Other route. Check glucose two times a day and as needed for out of control DM 250.00     . LIFESCAN FINEPOINT LANCETS MISC  Lancets and device for one touch verio flex meter. Use daily. DX 11.9 200 each 12  . lisinopril (ZESTRIL) 5 MG tablet Take 0.5 tablets (2.5 mg total) by mouth daily. (Patient taking differently: Take 2.5 mg by mouth at bedtime. ) 45 tablet 3  . metFORMIN (GLUCOPHAGE) 1000 MG tablet Take 1 tablet (1,000 mg total) by mouth 2 (two) times daily with a meal. 180 tablet 3  . ondansetron (ZOFRAN ODT) 4 MG disintegrating tablet Take 1 tablet (4 mg total) by mouth every 8 (eight) hours as needed for nausea or vomiting. 20 tablet 0  . pantoprazole (PROTONIX) 40 MG tablet Take 1 tablet (40 mg total) by mouth 2 (two) times daily. 180 tablet 0  . pantoprazole (PROTONIX) 40 MG tablet Take 1 tablet (40 mg total) by mouth daily. 30 tablet 3  . sucralfate (CARAFATE) 1 g tablet Take 1 tablet (1 g total) by mouth 4 (four) times daily -  with meals and at bedtime. 120 tablet 0  . trimethoprim (TRIMPEX) 100 MG tablet Take 100 mg by mouth daily.    . [DISCONTINUED] cholestyramine (QUESTRAN) 4 G packet Mix with juice or water and drink every 1-3 days. 30 each 1   No current facility-administered medications on file prior to visit.    Review of Systems  Constitutional: Negative for activity change, appetite change, fatigue, fever and unexpected weight change.  HENT: Negative for congestion, ear pain, rhinorrhea, sinus pressure and sore throat.   Eyes: Negative for pain, redness and visual disturbance.  Respiratory: Negative for cough, shortness of breath and  wheezing.   Cardiovascular: Negative for chest pain and palpitations.  Gastrointestinal: Negative for abdominal pain, blood in stool, constipation and diarrhea.  Endocrine: Negative for polydipsia and polyuria.  Genitourinary: Negative for dysuria, frequency and urgency.  Musculoskeletal: Negative for arthralgias, back pain and myalgias.       Leg cramps  Skin: Negative for pallor and rash.  Allergic/Immunologic: Negative for environmental allergies.    Neurological: Negative for dizziness, syncope and headaches.  Hematological: Negative for adenopathy. Does not bruise/bleed easily.  Psychiatric/Behavioral: Negative for decreased concentration and dysphoric mood. The patient is not nervous/anxious.        Objective:   Physical Exam Constitutional:      General: She is not in acute distress.    Appearance: Normal appearance. She is well-developed. She is obese. She is not ill-appearing or diaphoretic.  HENT:     Head: Normocephalic and atraumatic.  Eyes:     Conjunctiva/sclera: Conjunctivae normal.     Pupils: Pupils are equal, round, and reactive to light.  Neck:     Thyroid: No thyromegaly.     Vascular: No carotid bruit or JVD.  Cardiovascular:     Rate and Rhythm: Normal rate and regular rhythm.     Pulses: Normal pulses.     Heart sounds: Normal heart sounds. No gallop.   Pulmonary:     Effort: Pulmonary effort is normal. No respiratory distress.     Breath sounds: Normal breath sounds. No wheezing or rales.  Abdominal:     General: Bowel sounds are normal. There is no distension or abdominal bruit.     Palpations: Abdomen is soft. There is no mass.     Tenderness: There is no abdominal tenderness.  Musculoskeletal:     Cervical back: Normal range of motion and neck supple.     Right lower leg: No edema.     Left lower leg: No edema.     Comments: No calf tenderness No palpable cords or swelling in LEs  Lymphadenopathy:     Cervical: No cervical adenopathy.  Skin:    General: Skin is warm and dry.     Coloration: Skin is not pale.     Findings: No erythema or rash.  Neurological:     Mental Status: She is alert.     Coordination: Coordination normal.     Gait: Gait normal.     Deep Tendon Reflexes: Reflexes are normal and symmetric. Reflexes normal.  Psychiatric:        Mood and Affect: Mood normal.           Assessment & Plan:   Problem List Items Addressed This Visit      Digestive    ADENOCARCINOMA, SIGMOID COLON    No re occurrence Continues GI f/u         Endocrine   Hypothyroidism    Lab Results  Component Value Date   TSH 9.48 (H) 08/13/2019   Some sluggishness Pt now takes levothy correctly  Will inc dose to 135 mcg and re check 3 mo      Relevant Medications   levothyroxine (SYNTHROID) 125 MCG tablet   Diabetes type 2, controlled ([place]) - Primary    Lab Results  Component Value Date   HGBA1C 7.0 (H) 08/13/2019   This is up significantly - pt has increased portions and eaten more carbs Disc low glycemic diet and also exercise plan  Due for renal f/u  On statin  If cr remains high we may  need to re think metformin  F/u 3 mo         Musculoskeletal and Integument   Plantar wart of right foot    After consent obt- debrided with #15 scalpel/ some relief inst to use compound W or other otc prep to treat  If no imp would consider cryo tx  Using caution in light of diabetes         Other   B12 deficiency    B12 shot today      Obesity    Discussed how this problem influences overall health and the risks it imposes  Reviewed plan for weight loss with lower calorie diet (via better food choices and also portion control or program like weight watchers) and exercise building up to or more than 30 minutes 5 days per week including some aerobic activity         History of GI bleed    Doing better  Sees GI  Avoids nsaids  Cbc reviewed , taking iron      Elevated serum creatinine    Up significantly - has h/o proteinuria and due for renal f/u -enc her to schedule  Enc more water (very poor fluid intake )   Avoid nsaids If no imp with hydration will need to cut metformin       Leg cramps    Reassuring labs Enc strongly to drink more fluids - 64 oz per day  Also trial of mustard (tumeric) and magnesium prn as well as stretching

## 2019-08-15 NOTE — Assessment & Plan Note (Signed)
Lab Results  Component Value Date   TSH 9.48 (H) 08/13/2019   Some sluggishness Pt now takes levothy correctly  Will inc dose to 135 mcg and re check 3 mo

## 2019-08-15 NOTE — Assessment & Plan Note (Signed)
Lab Results  Component Value Date   HGBA1C 7.0 (H) 08/13/2019   This is up significantly - pt has increased portions and eaten more carbs Disc low glycemic diet and also exercise plan  Due for renal f/u  On statin  If cr remains high we may need to re think metformin  F/u 3 mo

## 2019-08-15 NOTE — Assessment & Plan Note (Signed)
Up significantly - has h/o proteinuria and due for renal f/u -enc her to schedule  Enc more water (very poor fluid intake )   Avoid nsaids If no imp with hydration will need to cut metformin

## 2019-08-15 NOTE — Assessment & Plan Note (Signed)
Discussed how this problem influences overall health and the risks it imposes  Reviewed plan for weight loss with lower calorie diet (via better food choices and also portion control or program like weight watchers) and exercise building up to or more than 30 minutes 5 days per week including some aerobic activity    

## 2019-08-18 NOTE — Assessment & Plan Note (Signed)
No re occurrence Continues GI f/u

## 2019-08-30 ENCOUNTER — Other Ambulatory Visit: Payer: Self-pay | Admitting: Family Medicine

## 2019-09-05 ENCOUNTER — Encounter: Payer: Self-pay | Admitting: Gastroenterology

## 2019-09-05 ENCOUNTER — Ambulatory Visit (INDEPENDENT_AMBULATORY_CARE_PROVIDER_SITE_OTHER): Payer: 59 | Admitting: Gastroenterology

## 2019-09-05 VITALS — BP 100/60 | HR 74 | Ht 62.5 in | Wt 195.0 lb

## 2019-09-05 DIAGNOSIS — D500 Iron deficiency anemia secondary to blood loss (chronic): Secondary | ICD-10-CM | POA: Diagnosis not present

## 2019-09-05 DIAGNOSIS — K760 Fatty (change of) liver, not elsewhere classified: Secondary | ICD-10-CM

## 2019-09-05 DIAGNOSIS — R1013 Epigastric pain: Secondary | ICD-10-CM

## 2019-09-05 MED ORDER — PANTOPRAZOLE SODIUM 40 MG PO TBEC: 40.0000 mg | DELAYED_RELEASE_TABLET | Freq: Every day | ORAL | 3 refills | Status: DC

## 2019-09-05 NOTE — Patient Instructions (Signed)
Continue Pantoprazole 40mg  every morning.   Add a daily dose of psyllium or methcellulose for stool bulking.   Continue to avoid NSAIDs  Follow-up in 1 year.   If you are age 57 or older, your body mass index should be between 23-30. Your Body mass index is 35.1 kg/m. If this is out of the aforementioned range listed, please consider follow up with your Primary Care Provider.  If you are age 52 or younger, your body mass index should be between 19-25. Your Body mass index is 35.1 kg/m. If this is out of the aformentioned range listed, please consider follow up with your Primary Care Provider.    We have sent the following medications to your pharmacy for you to pick up at your convenience: Pantoprazole   Thank you for choosing me and [facility].  Dr.Kimberly Beavers     .

## 2019-09-05 NOTE — Progress Notes (Signed)
Referring Provider: Abner Greenspan, MD Primary Care Physician:  Tower, [name], MD  Chief complaint: follow-up after endoscopy   IMPRESSION:  Recurrent, acute abdominal pain of unclear etiology - now resolved    - no recurrent PUD on EGD 07/2019 NSAID associated PUD with associated GI bleeding 12/2018 Suspect slight/early fatty infiltration of the liver on ultrasound 06/04/19    - normal AST/ALT    - NASH Fibrosure 07/22/19 showed F0 (no fibrosis), S2-3 (moderate steatosis) and NASH score 0.75 consistent with N2.  Personal history of colon cancer s/p resection 2010 Hyperplastic polyps on colonoscopy 10/09/2018  Symptoms leading up to recent ED visit have resolved. Most recent labs show resolution of anemia.   Suspected NASH given recent evaluation.  Reviewed the natural history and treatment options. Information brochure provided. Treatment is currently focused on working towards a healthy weight, regular exercise, and maximizing control of diabetes and cholesterol abnormalities. Hopefully, medical therapy will be available in the future. There is a known increased risk of cardiovascular disease in the patients with NASH.   Given concurrent diabetes, there is increased risk for disease progression. Therefore, I am recommending that we stage fatty liver disease need now to provide adequate care in the future.     PLAN: - Continue pantoprazole 57m QAM (3 months with 3 refills) - Add a daily dose of psyllium or methycellulose for stool bulking - Continue to avoid NSAIDs - Colonoscopy 2025 given the personal history of colon cancer - Office visit in one year to monitor for progression in liver disease  HPI: Kelli HOLCKis a 59y.o. female known to me for her surveillance colonoscopy 10/09/2018 and her hospitalization for GI bleed due to PUD 12/2018.  She was hospitalized in November 2020 for symptomatic, bleeding duodenal ulcers and erosive gastropathy presenting with melena and GI  blood loss anemia.  Pathology results showed gastric antral and oxyntic mucosa with no specific histologic changes.  There was no H. Pylori.  She had been using significant NSAIDs prior to her hospitalization.   She was seen in June 2021 after an ED visit for symptoms similar to those that she experienced at the time of her symptomatic PUD. Labs showed hemoglobin of 10.6. Liver enzymes were normal. Ultrasound should suspected fatty liver and was otherwise normal. Symptoms have improved with resumption of PPI. Using Tylenol. Ibuprofen once over the last year for a bad headaches.   EGD 08/01/19: - Normal esophagus. - Mild gastritis. No recurrent peptic ulcer disease. Hyperemia on gastric biopsies. No H pylori.  - Normal examined duodenum.  Recent labs to evaluate abnormal liver enzymes showed normal HBV serologies and were negative for autoimmune hepatitis, hemochromatosis and PBC. NASH Fibrosure 07/22/19 showed F0 (no fibrosis), S2-3 (moderate steatosis) and NASH score 0.75 consistent with N2.  She returns in scheduled follow-up after her EGD 08/01/19. No ongoing GI symptoms following endoscopy.  She is concerned that her symptoms will recur without pantoprazole.   Personal history of colon cancer s/p resection 2010 Prior colonoscopy with Dr. KDeatra Ina9/11/15 Most recent colonoscopy 10/09/18: 2 small hyperplastic polyps removed on colonoscopy 10/09/2018   Past Medical History:  Diagnosis Date  . Anemia   . Anxiety   . Arthritis   . Bipolar 1 disorder ([place]   . Cancer ([facility]    sigmoid colon cancer  . Depression   . Diabetes mellitus    type II  . Endometriosis   . Family history of malignant neoplasm of gastrointestinal tract   .  Ganglion cyst   . GERD (gastroesophageal reflux disease)   . Hyperlipidemia   . Hypothyroidism   . Insomnia due to mental condition     Past Surgical History:  Procedure Laterality Date  . ABDOMINAL HYSTERECTOMY    . ABDOMINAL HYSTERECTOMY    . BIOPSY   12/31/2018   Procedure: BIOPSY;  Surgeon: Doran Stabler, MD;  Location: [facility] ENDOSCOPY;  Service: Gastroenterology;;  . COLON RESECTION  June 2010  . COLON SURGERY    . COLONOSCOPY    . DILATION AND CURETTAGE OF UTERUS  2006   miscarriage   . ESOPHAGOGASTRODUODENOSCOPY N/A 12/31/2018   Procedure: ESOPHAGOGASTRODUODENOSCOPY (EGD);  Surgeon: Doran Stabler, MD;  Location: [place];  Service: Gastroenterology;  Laterality: N/A;  . GANGLION CYST EXCISION    . HERNIA REPAIR    . INTERSTIM IMPLANT PLACEMENT Left 12/11/2012   stimulator is on the left but the electrodes go to the right  . KNEE DISLOCATION SURGERY    . LAPAROSCOPY  1997   D & C for endometriosis    Current Outpatient Medications  Medication Sig Dispense Refill  . ALPRAZolam (XANAX) 0.5 MG tablet Take 0.5 mg by mouth at bedtime as needed for anxiety (panic attack).     Marland Kitchen amphetamine-dextroamphetamine (ADDERALL XR) 30 MG 24 hr capsule Take 30 mg by mouth daily at 6 (six) AM.     . amphetamine-dextroamphetamine (ADDERALL) 15 MG tablet Take 15 mg by mouth daily at 2 PM.     . atorvastatin (LIPITOR) 10 MG tablet TAKE 1 TABLET DAILY 90 tablet 0  . Blood Glucose Monitoring Suppl (ONETOUCH VERIO FLEX SYSTEM) w/Device KIT Check sugar twice daily and as needed. DX 11.9 1 kit 12  . cariprazine (VRAYLAR) capsule Take 3 mg by mouth every 3 (three) days.     . Cholecalciferol (VITAMIN D3) 125 MCG (5000 UT) CAPS Take 5,000 Units by mouth daily.    . citalopram (CELEXA) 40 MG tablet Take 40 mg by mouth at bedtime.     . cyanocobalamin (,VITAMIN B-12,) 1000 MCG/ML injection Inject 1 mL (1,000 mcg total) into the muscle every 3 (three) months. every 8 weeks 1 mL 5  . Cyanocobalamin (VITAMIN B-12) 5000 MCG TBDP Take 5,000 mcg by mouth daily.    . Diabetic Sterile Lancets MISC by Does not apply route. Check glucose two times a day and as needed for out of control DM 250.00     . doxepin (SINEQUAN) 50 MG capsule Take 50 mg by mouth at  bedtime.   1  . ferrous sulfate 325 (65 FE) MG tablet Take 325 mg by mouth daily with breakfast.    . gabapentin (NEURONTIN) 300 MG capsule Take 1 capsule (300 mg total) by mouth 2 (two) times daily. 180 capsule 3  . glipiZIDE (GLUCOTROL XL) 5 MG 24 hr tablet Take 1 tablet (5 mg total) by mouth daily. 90 tablet 3  . glucose blood (ONETOUCH VERIO) test strip Strips for one touch verio flex meter. Check sugar twice daily DX E11.9 200 each 12  . glucose blood test strip 1 each by Other route. Check glucose two times a day and as needed for out of control DM 250.00     . levothyroxine (SYNTHROID) 125 MCG tablet Take 1 tablet (125 mcg total) by mouth daily. 90 tablet 3  . LIFESCAN FINEPOINT LANCETS MISC Lancets and device for one touch verio flex meter. Use daily. DX 11.9 200 each 12  . lisinopril (ZESTRIL)  5 MG tablet Take 0.5 tablets (2.5 mg total) by mouth daily. (Patient taking differently: Take 2.5 mg by mouth at bedtime. ) 45 tablet 3  . metFORMIN (GLUCOPHAGE) 1000 MG tablet Take 1 tablet (1,000 mg total) by mouth 2 (two) times daily with a meal. 180 tablet 3  . ondansetron (ZOFRAN ODT) 4 MG disintegrating tablet Take 1 tablet (4 mg total) by mouth every 8 (eight) hours as needed for nausea or vomiting. 20 tablet 0  . pantoprazole (PROTONIX) 40 MG tablet Take 1 tablet (40 mg total) by mouth daily. 30 tablet 3  . sucralfate (CARAFATE) 1 g tablet Take 1 tablet (1 g total) by mouth 4 (four) times daily -  with meals and at bedtime. 120 tablet 0  . trimethoprim (TRIMPEX) 100 MG tablet Take 100 mg by mouth daily.    . pantoprazole (PROTONIX) 40 MG tablet Take 1 tablet (40 mg total) by mouth 2 (two) times daily. 180 tablet 0   No current facility-administered medications for this visit.    Allergies as of 09/05/2019 - Review Complete 09/05/2019  Allergen Reaction Noted  . Hydrocodone Nausea And Vomiting 09/12/2006  . Keflex [cephalexin] Nausea And Vomiting and Other (See Comments) 12/29/2018  .  Mirabegron Other (See Comments) 09/18/2018  . Milk-related compounds Nausea And Vomiting 05/01/2011    Family History  Problem Relation Age of Onset  . Breast cancer Mother   . Diabetes Mother   . Coronary artery disease Mother   . Kidney disease Mother        renal insufficiency  . Hyperlipidemia Mother   . Diabetes Father   . Coronary artery disease Father   . Colon cancer Father   . Colon cancer Paternal Grandmother   . Breast cancer Maternal Aunt   . Breast cancer Paternal Aunt   . Esophageal cancer Neg Hx   . Rectal cancer Neg Hx   . Stomach cancer Neg Hx     Social History   Socioeconomic History  . Marital status: Married    Spouse name: Not on file  . Number of children: 0  . Years of education: Not on file  . Highest education level: Not on file  Occupational History  . 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 Smoking status: Passive Smoke Exposure - Never Smoker  . Smokeless tobacco: Never Used  . Tobacco comment: husband quit smoking  Vaping Use  . Vaping Use: Never used  Substance and Sexual Activity  . Alcohol use: Yes    Alcohol/week: 0.0 standard drinks    Comment: socially twice a month at most or less  . Drug use: No  . Sexual activity: Not on file  Other Topics Concern  . Not on file  Social History Narrative   Daily caffeine use   Social Determinants of Health   Financial Resource Strain:   . Difficulty of Paying Living Expenses:   Food Insecurity:   . Worried About Charity fundraiser in the Last Year:   . Arboriculturist in the Last Year:   Transportation Needs:   . Film/video editor (Medical):   Marland Kitchen Lack of Transportation (Non-Medical):   Physical Activity:   . Days of Exercise per Week:   . Minutes of Exercise per Session:   Stress:   . Feeling of Stress :   Social Connections:   . Frequency of Communication with Friends  and Family:   . Frequency of Social  Gatherings with Friends and Family:   . Attends Religious Services:   . Active Member of Clubs or Organizations:   . Attends Archivist Meetings:   Marland Kitchen Marital Status:   Intimate Partner Violence:   . Fear of Current or Ex-Partner:   . Emotionally Abused:   Marland Kitchen Physically Abused:   . Sexually Abused:      Physical Exam: General:   Alert,  well-nourished, pleasant and cooperative in NAD Head:  Normocephalic and atraumatic. Eyes:  Sclera clear, no icterus.   Conjunctiva pink. Abdomen:  Soft, nontender, nondistended, normal bowel sounds, no rebound or guarding. No hepatosplenomegaly.   Rectal:  Deferred  Msk:  Symmetrical. No boney deformities LAD: No inguinal or umbilical LAD Extremities:  No clubbing or edema. Neurologic:  Alert and  oriented x4;  grossly nonfocal Skin:  Intact without significant lesions or rashes. Psych:  Alert and cooperative. Normal mood and affect.  Michai Dieppa L. Tarri Glenn, MD, MPH 09/05/2019, 10:19 AM      Referring Provider: Abner Greenspan, MD Primary Care Physician:  Tower, [name], MD  Chief complaint:  ED follow-up   IMPRESSION:  Recurrent, acute abdominal pain NSAID associated PUD with associated GI bleeding 12/2018 Suspect slight/early fatty infiltration of the liver    - normal AST/ALT Personal history of colon cancer s/p resection 2010 Hyperplastic polyps on colonoscopy 10/09/2018  Symptoms leading up to recent ED visit have resolved. Most recent labs show resolution of anemia.  Patient concerned for recurrent PUD after ED evaluation.  ED doctor mentioned the fatty liver seen on ultrasound. Likely consistent with fatty liver given her concurrent diabetes. Must exclude other causes of hepatocellular inflammation.  Labs today to screen for concurrent disease and NASH FibroSure for assessment of staging.    PLAN: - Continue pantoprazole 61m QAM (3 months with 3 refills) - Continue to avoid NSAIDs - OTC iron recommended until time of  EGD, may be able to discontinue at that time - EGD to follow-up on gastric ulcer - Prior labs:  hepatitis B surface antigen, hepatitis B core antibody, fasting ferritin, iron, ANA, AMA, anti-smooth muscle antibody, IgG, IgM, NASH FibroSURE - Follow-up after endoscopy - Colonoscopy 2025 given the personal history of colon cancer   HPI: CBLAKLEIGH STRAWis a 59y.o. female known to me for her surveillance colonoscopy 10/09/2018 and her hospitalization for GI bleed due to PUD 12/2018.  She returns now in follow-up after an ER visit with concerns for recurrent PUD.  The interval history is obtained through the patient and review of her electronic health record.  Had the Covid vaccine in April.    She was hospitalized in November 2020 for symptomatic, bleeding duodenal ulcers and erosive gastropathy presenting with melena and GI blood loss anemia.  Pathology results showed gastric antral and oxyntic mucosa with no specific histologic changes.  There was no H. Pylori.  She had been using significant NSAIDs prior to her hospitalization.   Hemoglobin nadired 12/31/2018 at 7.9.  She was given 2 units of packed red blood cells as well as a Feraheme infusion 11/19.  Hemoglobin on discharge was 8.5. Hemoglobin had increased to 10.9 02/04/19.   Personal history of colon cancer s/p resection 2010 Prior colonoscopy with Dr. KDeatra Ina9/11/15 Most recent colonoscopy 10/09/18: 2 small hyperplastic polyps removed on colonoscopy 10/09/2018  Recurrent symptoms to those she experienced last year leading up to her diagnosis of PUD that occurred after eating  BBQ 06/03/19. Seen in the ED. Labs showed hemoglobin of 10.6. Liver enzymes were normal. Ultrasound should suspected fatty liver and was otherwise normal. Symptoms have improved with resumption of PPI. Using Tylenol. Ibuprofen once over the last year for a bad headaches.  No change in bowel habits or blood in the stool.    Past Medical History:  Diagnosis Date  . Anemia    . Anxiety   . Arthritis   . Bipolar 1 disorder ([place])   . Cancer [facility])    sigmoid colon cancer  . Depression   . Diabetes mellitus    type II  . Endometriosis   . Family history of malignant neoplasm of gastrointestinal tract   . Ganglion cyst   . GERD (gastroesophageal reflux disease)   . Hyperlipidemia   . Hypothyroidism   . Insomnia due to mental condition     Past Surgical History:  Procedure Laterality Date  . ABDOMINAL HYSTERECTOMY    . ABDOMINAL HYSTERECTOMY    . BIOPSY  12/31/2018   Procedure: BIOPSY;  Surgeon: Doran Stabler, MD;  Location: [facility] ENDOSCOPY;  Service: Gastroenterology;;  . COLON RESECTION  June 2010  . COLON SURGERY    . COLONOSCOPY    . DILATION AND CURETTAGE OF UTERUS  2006   miscarriage   . ESOPHAGOGASTRODUODENOSCOPY N/A 12/31/2018   Procedure: ESOPHAGOGASTRODUODENOSCOPY (EGD);  Surgeon: Doran Stabler, MD;  Location: [place];  Service: Gastroenterology;  Laterality: N/A;  . GANGLION CYST EXCISION    . HERNIA REPAIR    . INTERSTIM IMPLANT PLACEMENT Left 12/11/2012   stimulator is on the left but the electrodes go to the right  . KNEE DISLOCATION SURGERY    . LAPAROSCOPY  1997   D & C for endometriosis    Current Outpatient Medications  Medication Sig Dispense Refill  . ALPRAZolam (XANAX) 0.5 MG tablet Take 0.5 mg by mouth at bedtime as needed for anxiety (panic attack).     Marland Kitchen amphetamine-dextroamphetamine (ADDERALL XR) 30 MG 24 hr capsule Take 30 mg by mouth daily at 6 (six) AM.     . amphetamine-dextroamphetamine (ADDERALL) 15 MG tablet Take 15 mg by mouth daily at 2 PM.     . atorvastatin (LIPITOR) 10 MG tablet TAKE 1 TABLET DAILY 90 tablet 0  . Blood Glucose Monitoring Suppl (ONETOUCH VERIO FLEX SYSTEM) w/Device KIT Check sugar twice daily and as needed. DX 11.9 1 kit 12  . cariprazine (VRAYLAR) capsule Take 3 mg by mouth every 3 (three) days.     . Cholecalciferol (VITAMIN D3) 125 MCG (5000 UT) CAPS Take 5,000 Units by mouth  daily.    . citalopram (CELEXA) 40 MG tablet Take 40 mg by mouth at bedtime.     . cyanocobalamin (,VITAMIN B-12,) 1000 MCG/ML injection Inject 1 mL (1,000 mcg total) into the muscle every 3 (three) months. every 8 weeks 1 mL 5  . Cyanocobalamin (VITAMIN B-12) 5000 MCG TBDP Take 5,000 mcg by mouth daily.    . Diabetic Sterile Lancets MISC by Does not apply route. Check glucose two times a day and as needed for out of control DM 250.00     . doxepin (SINEQUAN) 50 MG capsule Take 50 mg by mouth at bedtime.   1  . ferrous sulfate 325 (65 FE) MG tablet Take 325 mg by mouth daily with breakfast.    . gabapentin (NEURONTIN) 300 MG capsule Take 1 capsule (300 mg total) by mouth 2 (two) times daily. [place]  capsule 3  . glipiZIDE (GLUCOTROL XL) 5 MG 24 hr tablet Take 1 tablet (5 mg total) by mouth daily. 90 tablet 3  . glucose blood (ONETOUCH VERIO) test strip Strips for one touch verio flex meter. Check sugar twice daily DX E11.9 200 each 12  . glucose blood test strip 1 each by Other route. Check glucose two times a day and as needed for out of control DM 250.00     . levothyroxine (SYNTHROID) 125 MCG tablet Take 1 tablet (125 mcg total) by mouth daily. 90 tablet 3  . LIFESCAN FINEPOINT LANCETS MISC Lancets and device for one touch verio flex meter. Use daily. DX 11.9 200 each 12  . lisinopril (ZESTRIL) 5 MG tablet Take 0.5 tablets (2.5 mg total) by mouth daily. (Patient taking differently: Take 2.5 mg by mouth at bedtime. ) 45 tablet 3  . metFORMIN (GLUCOPHAGE) 1000 MG tablet Take 1 tablet (1,000 mg total) by mouth 2 (two) times daily with a meal. 180 tablet 3  . ondansetron (ZOFRAN ODT) 4 MG disintegrating tablet Take 1 tablet (4 mg total) by mouth every 8 (eight) hours as needed for nausea or vomiting. 20 tablet 0  . pantoprazole (PROTONIX) 40 MG tablet Take 1 tablet (40 mg total) by mouth daily. 30 tablet 3  . sucralfate (CARAFATE) 1 g tablet Take 1 tablet (1 g total) by mouth 4 (four) times daily -  with  meals and at bedtime. 120 tablet 0  . trimethoprim (TRIMPEX) 100 MG tablet Take 100 mg by mouth daily.    . pantoprazole (PROTONIX) 40 MG tablet Take 1 tablet (40 mg total) by mouth 2 (two) times daily. 180 tablet 0   No current facility-administered medications for this visit.    Allergies as of 09/05/2019 - Review Complete 09/05/2019  Allergen Reaction Noted  . Hydrocodone Nausea And Vomiting 09/12/2006  . Keflex [cephalexin] Nausea And Vomiting and Other (See Comments) 12/29/2018  . Mirabegron Other (See Comments) 09/18/2018  . Milk-related compounds Nausea And Vomiting 05/01/2011    Family History  Problem Relation Age of Onset  . Breast cancer Mother   . Diabetes Mother   . Coronary artery disease Mother   . Kidney disease Mother        renal insufficiency  . Hyperlipidemia Mother   . Diabetes Father   . Coronary artery disease Father   . Colon cancer Father   . Colon cancer Paternal Grandmother   . Breast cancer Maternal Aunt   . Breast cancer Paternal Aunt   . Esophageal cancer Neg Hx   . Rectal cancer Neg Hx   . Stomach cancer Neg Hx     Social History   Socioeconomic History  . Marital status: Married    Spouse name: Not on file  . Number of children: 0  . Years of education: Not on file  . Highest education level: Not on file  Occupational History  . 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 Smoking status: Passive Smoke Exposure - Never Smoker  . Smokeless tobacco: Never Used  . Tobacco comment: husband quit smoking  Vaping Use  . Vaping Use: Never used  Substance and Sexual Activity  . Alcohol use: Yes    Alcohol/week: 0.0 standard drinks    Comment: socially twice a month at most or less  . Drug use: No  . Sexual activity: Not on file  Other Topics  Concern  . Not on file  Social History Narrative   Daily caffeine use   Social Determinants of Health   Financial Resource  Strain:   . Difficulty of Paying Living Expenses:   Food Insecurity:   . Worried About Charity fundraiser in the Last Year:   . Arboriculturist in the Last Year:   Transportation Needs:   . Film/video editor (Medical):   Marland Kitchen Lack of Transportation (Non-Medical):   Physical Activity:   . Days of Exercise per Week:   . Minutes of Exercise per Session:   Stress:   . Feeling of Stress :   Social Connections:   . Frequency of Communication with Friends and Family:   . Frequency of Social Gatherings with Friends and Family:   . Attends Religious Services:   . Active Member of Clubs or Organizations:   . Attends Archivist Meetings:   Marland Kitchen Marital Status:   Intimate Partner Violence:   . Fear of Current or Ex-Partner:   . Emotionally Abused:   Marland Kitchen Physically Abused:   . Sexually Abused:      Physical Exam: General:   Alert,  well-nourished, pleasant and cooperative in NAD Head:  Normocephalic and atraumatic. Eyes:  Sclera clear, no icterus.   Conjunctiva pink. Abdomen:  Soft, nontender, nondistended, normal bowel sounds, no rebound or guarding. No hepatosplenomegaly.   Neurologic:  Alert and  oriented x4;  grossly nonfocal Skin:  Intact without significant lesions or rashes. Psych:  Alert and cooperative. Normal mood and affect.  Dianey Suchy L. Tarri Glenn, MD, MPH 09/05/2019, 10:20 AM

## 2019-09-06 ENCOUNTER — Other Ambulatory Visit: Payer: Self-pay | Admitting: Family Medicine

## 2019-09-07 ENCOUNTER — Encounter: Payer: Self-pay | Admitting: Gastroenterology

## 2019-09-12 ENCOUNTER — Encounter: Payer: Self-pay | Admitting: Family Medicine

## 2019-09-12 ENCOUNTER — Telehealth: Payer: Self-pay

## 2019-09-12 ENCOUNTER — Ambulatory Visit (INDEPENDENT_AMBULATORY_CARE_PROVIDER_SITE_OTHER): Payer: 59 | Admitting: Family Medicine

## 2019-09-12 ENCOUNTER — Other Ambulatory Visit: Payer: Self-pay

## 2019-09-12 ENCOUNTER — Telehealth: Payer: Self-pay | Admitting: Family Medicine

## 2019-09-12 VITALS — BP 126/78 | HR 96 | Temp 96.9°F | Wt 193.2 lb

## 2019-09-12 DIAGNOSIS — R809 Proteinuria, unspecified: Secondary | ICD-10-CM

## 2019-09-12 DIAGNOSIS — E6609 Other obesity due to excess calories: Secondary | ICD-10-CM

## 2019-09-12 DIAGNOSIS — Z6834 Body mass index (BMI) 34.0-34.9, adult: Secondary | ICD-10-CM

## 2019-09-12 DIAGNOSIS — R7989 Other specified abnormal findings of blood chemistry: Secondary | ICD-10-CM

## 2019-09-12 DIAGNOSIS — E039 Hypothyroidism, unspecified: Secondary | ICD-10-CM

## 2019-09-12 DIAGNOSIS — E119 Type 2 diabetes mellitus without complications: Secondary | ICD-10-CM | POA: Diagnosis not present

## 2019-09-12 DIAGNOSIS — R300 Dysuria: Secondary | ICD-10-CM

## 2019-09-12 DIAGNOSIS — R5382 Chronic fatigue, unspecified: Secondary | ICD-10-CM

## 2019-09-12 DIAGNOSIS — R5383 Other fatigue: Secondary | ICD-10-CM | POA: Insufficient documentation

## 2019-09-12 LAB — RENAL FUNCTION PANEL
Albumin: 4.2 g/dL (ref 3.5–5.2)
BUN: 25 mg/dL — ABNORMAL HIGH (ref 6–23)
CO2: 26 mEq/L (ref 19–32)
Calcium: 10.1 mg/dL (ref 8.4–10.5)
Chloride: 105 mEq/L (ref 96–112)
Creatinine, Ser: 1.24 mg/dL — ABNORMAL HIGH (ref 0.40–1.20)
GFR: 44.62 mL/min — ABNORMAL LOW (ref >60.00)
Glucose, Bld: 71 mg/dL (ref 70–99)
Phosphorus: 3.7 mg/dL (ref 2.3–4.6)
Potassium: 4.8 mEq/L (ref 3.5–5.1)
Sodium: 140 mEq/L (ref 135–145)

## 2019-09-12 LAB — POC URINALSYSI DIPSTICK (AUTOMATED)
Bilirubin, UA: NEGATIVE
Blood, UA: NEGATIVE
Glucose, UA: NEGATIVE
Ketones, UA: NEGATIVE
Leukocytes, UA: NEGATIVE
Nitrite, UA: NEGATIVE
Protein, UA: POSITIVE — AB
Spec Grav, UA: >=1.03 — AB (ref 1.010–1.025)
Urobilinogen, UA: 0.2 E.U./dL
pH, UA: 6 (ref 5.0–8.0)

## 2019-09-12 LAB — TSH: TSH: 0.43 u[IU]/mL (ref 0.35–4.50)

## 2019-09-12 NOTE — Telephone Encounter (Signed)
Pt said FBS has been running between 60-80 for about 1 month. Pt feeling really tired;shakiness on and off but seems worse in morning. Pt has eaten this morning so not shaky now. Pt has been very irritable and feels hungry all the time. This morning FBS was 70. Pt is presently taking glipizide 5 mg daily and metformin 1000 mg bid. Pt has taken these meds this morning. Pt concerned may have UTI; pressure when urinates. Pt scheduled 30' appt today at 10:15 with Dr Glori Bickers. UC & ED precautions given and pt voiced understanding.

## 2019-09-12 NOTE — Progress Notes (Signed)
Subjective:    Patient ID: Kelli Cruz, female    DOB: June 11, 1960, 59 y.o.   MRN: [id number]  This visit occurred during the SARS-CoV-2 public health emergency.  Safety protocols were in place, including screening questions prior to the visit, additional usage of staff PPE, and extensive cleaning of exam room while observing appropriate contact time as indicated for disinfecting solutions.    HPI  Pt presents for f/u of chronic medical problems and urinary symptoms    Wt Readings from Last 3 Encounters:  09/12/19 193 lb 4 oz (87.7 kg)  09/05/19 195 lb (88.5 kg)  08/15/19 194 lb (88 kg)   34.78 kg/m   DM2 Lab Results  Component Value Date   HGBA1C 7.0 (H) 08/13/2019   At that time had been less compliant with diet and we discussed a low glycemic diet  Feels lately like glucose is running low (60-80) over the past mo -moreso in am  Will get shaky from this  Taking metformin 1000 bid Glipizide 5 xl   Is really tired as well   None of her psychiatric medicines have changed  Has a visit with her psychiatrist today  Is more down/depressed    Takes lisinopril 5 mg for renal protection  Atorvastatin for cholesterol   Has a h/o proteinuria - has seen nephrology   Lab Results  Component Value Date   CREATININE 1.69 (H) 08/13/2019   BUN 27 (H) 08/13/2019   NA 140 08/13/2019   K 4.8 08/13/2019   CL 103 08/13/2019   CO2 25 08/13/2019   Some urinary symptoms Pressure/bladder  Not all the time though Leaks occ - few times (urgency)   Results for orders placed or performed in visit on 09/12/19  POCT Urinalysis Dipstick (Automated)  Result Value Ref Range   Color, UA Yellow    Clarity, UA Clear    Glucose, UA Negative Negative   Bilirubin, UA Negative    Ketones, UA Negative    Spec Grav, UA >=1.030 (A) 1.010 - 1.025   Blood, UA Negative    pH, UA 6.0 5.0 - 8.0   Protein, UA Positive (A) Negative   Urobilinogen, UA 0.2 0.2 or 1.0 E.U./dL   Nitrite, UA  Negative    Leukocytes, UA Negative Negative   she has tried to drink more   She drinks power ade   Did increase levothyroxine a month ago  Patient Active Problem List   Diagnosis Date Noted  . Fatigue 09/12/2019  . Leg cramps 08/15/2019  . Plantar wart of right foot 08/15/2019  . Elevated serum creatinine 02/03/2019  . History of GI bleed 12/29/2018  . Anemia 02/19/2017  . Vitamin D deficiency 02/19/2017  . Knee pain, right 11/10/2016  . Proteinuria 02/18/2016  . Exposure to communicable disease 08/04/2015  . Obesity 02/04/2015  . Hypersomnia with sleep apnea 01/06/2015  . Insomnia due to mental condition   . Tingling 08/12/2014  . Burning sensation of mouth 08/12/2014  . Numbness 10/24/2011  . Routine general medical examination at a health care facility 10/24/2011  . TMJ arthralgia 08/14/2011  . Poor posture 07/26/2011  . B12 deficiency 04/13/2010  . ADENOCARCINOMA, SIGMOID COLON 08/11/2008  . Hyperlipidemia 07/08/2008  . Hypothyroidism 09/12/2006  . Diabetes type 2, controlled ([place]) 09/12/2006  . Depression with anxiety 09/12/2006  . ALLERGIC RHINITIS, SEASONAL 09/12/2006  . FIBROCYSTIC BREAST DISEASE 09/12/2006  . MIGRAINES, HX OF 09/12/2006   Past Medical History:  Diagnosis Date  . Anemia   .  Anxiety   . Arthritis   . Bipolar 1 disorder ([place])   . Cancer [facility])    sigmoid colon cancer  . Depression   . Diabetes mellitus    type II  . Endometriosis   . Family history of malignant neoplasm of gastrointestinal tract   . Ganglion cyst   . GERD (gastroesophageal reflux disease)   . Hyperlipidemia   . Hypothyroidism   . Insomnia due to mental condition    Past Surgical History:  Procedure Laterality Date  . ABDOMINAL HYSTERECTOMY    . ABDOMINAL HYSTERECTOMY    . BIOPSY  12/31/2018   Procedure: BIOPSY;  Surgeon: Doran Stabler, MD;  Location: [facility] ENDOSCOPY;  Service: Gastroenterology;;  . COLON RESECTION  June 2010  . COLON SURGERY    . COLONOSCOPY     . DILATION AND CURETTAGE OF UTERUS  2006   miscarriage   . ESOPHAGOGASTRODUODENOSCOPY N/A 12/31/2018   Procedure: ESOPHAGOGASTRODUODENOSCOPY (EGD);  Surgeon: Doran Stabler, MD;  Location: [place];  Service: Gastroenterology;  Laterality: N/A;  . GANGLION CYST EXCISION    . HERNIA REPAIR    . INTERSTIM IMPLANT PLACEMENT Left 12/11/2012   stimulator is on the left but the electrodes go to the right  . KNEE DISLOCATION SURGERY    . LAPAROSCOPY  1997   D & C for endometriosis   Social History   Tobacco Use  . Smoking status: Passive Smoke Exposure - Never Smoker  . Smokeless tobacco: Never Used  . Tobacco comment: husband quit smoking  Vaping Use  . Vaping Use: Never used  Substance Use Topics  . Alcohol use: Yes    Alcohol/week: 0.0 standard drinks    Comment: socially twice a month at most or less  . Drug use: No   Family History  Problem Relation Age of Onset  . Breast cancer Mother   . Diabetes Mother   . Coronary artery disease Mother   . Kidney disease Mother        renal insufficiency  . Hyperlipidemia Mother   . Diabetes Father   . Coronary artery disease Father   . Colon cancer Father   . Colon cancer Paternal Grandmother   . Breast cancer Maternal Aunt   . Breast cancer Paternal Aunt   . Esophageal cancer Neg Hx   . Rectal cancer Neg Hx   . Stomach cancer Neg Hx    Allergies  Allergen Reactions  . Keflex [Cephalexin] Nausea And Vomiting and Other (See Comments)    Light-headed/dizziness/weakness  . Mirabegron Other (See Comments)    Trouble breathing and swollen tongue   Current Outpatient Medications on File Prior to Visit  Medication Sig Dispense Refill  . ALPRAZolam (XANAX) 0.5 MG tablet Take 0.5 mg by mouth at bedtime as needed for anxiety (panic attack).     Marland Kitchen amphetamine-dextroamphetamine (ADDERALL XR) 30 MG 24 hr capsule Take 30 mg by mouth daily at 6 (six) AM.     . amphetamine-dextroamphetamine (ADDERALL) 15 MG tablet Take 15 mg by  mouth daily at 2 PM.     . atorvastatin (LIPITOR) 10 MG tablet TAKE 1 TABLET DAILY 90 tablet 0  . Blood Glucose Monitoring Suppl (ONETOUCH VERIO FLEX SYSTEM) w/Device KIT Check sugar twice daily and as needed. DX 11.9 1 kit 12  . cariprazine (VRAYLAR) capsule Take 3 mg by mouth every 3 (three) days.     . Cholecalciferol (VITAMIN D3) 125 MCG (5000 UT) CAPS Take 5,000 Units by  mouth daily.    . citalopram (CELEXA) 40 MG tablet Take 40 mg by mouth at bedtime.     . cyanocobalamin (,VITAMIN B-12,) 1000 MCG/ML injection Inject 1 mL (1,000 mcg total) into the muscle every 3 (three) months. every 8 weeks 1 mL 5  . Cyanocobalamin (VITAMIN B-12) 5000 MCG TBDP Take 5,000 mcg by mouth daily.    . Diabetic Sterile Lancets MISC by Does not apply route. Check glucose two times a day and as needed for out of control DM 250.00     . doxepin (SINEQUAN) 50 MG capsule Take 50 mg by mouth at bedtime.   1  . ferrous sulfate 325 (65 FE) MG tablet Take 325 mg by mouth daily with breakfast.    . gabapentin (NEURONTIN) 300 MG capsule Take 1 capsule (300 mg total) by mouth 2 (two) times daily. 180 capsule 3  . glipiZIDE (GLUCOTROL XL) 5 MG 24 hr tablet Take 1 tablet (5 mg total) by mouth daily. 90 tablet 3  . glucose blood (ONETOUCH VERIO) test strip CHECK SUGAR TWICE A DAY (DX E11.9) 200 each 0  . glucose blood test strip 1 each by Other route. Check glucose two times a day and as needed for out of control DM 250.00     . Lancets (ONETOUCH DELICA PLUS GEXBMW41L) MISC Use to check blood sugar twice daily (DX E11.9) 200 each 0  . levothyroxine (SYNTHROID) 125 MCG tablet Take 1 tablet (125 mcg total) by mouth daily. 90 tablet 3  . LIFESCAN FINEPOINT LANCETS MISC Lancets and device for one touch verio flex meter. Use daily. DX 11.9 200 each 12  . lisinopril (ZESTRIL) 5 MG tablet Take 0.5 tablets (2.5 mg total) by mouth daily. (Patient taking differently: Take 2.5 mg by mouth at bedtime. ) 45 tablet 3  . metFORMIN  (GLUCOPHAGE) 1000 MG tablet Take 1 tablet (1,000 mg total) by mouth 2 (two) times daily with a meal. 180 tablet 3  . ondansetron (ZOFRAN ODT) 4 MG disintegrating tablet Take 1 tablet (4 mg total) by mouth every 8 (eight) hours as needed for nausea or vomiting. 20 tablet 0  . pantoprazole (PROTONIX) 40 MG tablet Take 1 tablet (40 mg total) by mouth daily. 90 tablet 3  . sucralfate (CARAFATE) 1 g tablet Take 1 tablet (1 g total) by mouth 4 (four) times daily -  with meals and at bedtime. 120 tablet 0  . trimethoprim (TRIMPEX) 100 MG tablet Take 100 mg by mouth daily.    . [DISCONTINUED] cholestyramine (QUESTRAN) 4 G packet Mix with juice or water and drink every 1-3 days. 30 each 1   No current facility-administered medications on file prior to visit.    Review of Systems  Constitutional: Positive for fatigue. Negative for activity change, appetite change, fever and unexpected weight change.  HENT: Negative for congestion, ear pain, rhinorrhea, sinus pressure and sore throat.   Eyes: Negative for pain, redness and visual disturbance.  Respiratory: Negative for cough, shortness of breath and wheezing.   Cardiovascular: Negative for chest pain and palpitations.  Gastrointestinal: Negative for abdominal pain, blood in stool, constipation and diarrhea.  Endocrine: Negative for polydipsia and polyuria.  Genitourinary: Negative for dysuria, frequency and urgency.  Musculoskeletal: Negative for arthralgias, back pain and myalgias.  Skin: Negative for pallor and rash.  Allergic/Immunologic: Negative for environmental allergies.  Neurological: Positive for numbness. Negative for dizziness, syncope and headaches.  Hematological: Negative for adenopathy. Does not bruise/bleed easily.  Psychiatric/Behavioral: Positive for dysphoric  mood. Negative for decreased concentration and self-injury. The patient is nervous/anxious.        Objective:   Physical Exam Constitutional:      General: She is not in  acute distress.    Appearance: Normal appearance. She is well-developed. She is obese. She is not ill-appearing or diaphoretic.  HENT:     Head: Normocephalic and atraumatic.  Eyes:     Conjunctiva/sclera: Conjunctivae normal.     Pupils: Pupils are equal, round, and reactive to light.  Neck:     Thyroid: No thyromegaly.     Vascular: No carotid bruit or JVD.  Cardiovascular:     Rate and Rhythm: Regular rhythm. Tachycardia present.     Heart sounds: Normal heart sounds. No gallop.   Pulmonary:     Effort: Pulmonary effort is normal. No respiratory distress.     Breath sounds: Normal breath sounds. No wheezing or rales.  Abdominal:     General: Bowel sounds are normal. There is no distension or abdominal bruit.     Palpations: Abdomen is soft. There is no mass.     Tenderness: There is no abdominal tenderness.  Musculoskeletal:     Cervical back: Normal range of motion and neck supple.  Lymphadenopathy:     Cervical: No cervical adenopathy.  Skin:    General: Skin is warm and dry.     Findings: No rash.  Neurological:     Mental Status: She is alert.     Sensory: No sensory deficit.     Coordination: Coordination normal.     Deep Tendon Reflexes: Reflexes are normal and symmetric. Reflexes normal.  Psychiatric:        Mood and Affect: Mood normal. Affect is flat.        Cognition and Memory: Cognition and memory normal.     Comments: Flat affect-baseline           Assessment & Plan:   Problem List Items Addressed This Visit      Endocrine   Hypothyroidism    More fatigued  Will check tsh today Taking levothyroxine 125 mcg      Relevant Orders   TSH (Completed)   Diabetes type 2, controlled ([place]) - Primary    Lab Results  Component Value Date   HGBA1C 7.0 (H) 08/13/2019   Was less compliant with diet at that time and now better  Having low glucose readings /some symptomatic  Taking metformin -need to hold for renal insuff and glipizide  Will hold  metformin for a week and then call with her glucose readings On low dose lisinopril for renal protection         Other   Obesity   Proteinuria    With DM2 on low dose ace  Labs today  Needs new nephrologist-ref made      Relevant Orders   Ambulatory referral to Nephrology   Elevated serum creatinine    Needs new nephrologist  Disc imp of inc fluid intake (urine is concentrated)  No signs of urinary infection Labs pending Ref made to new nephrologist       Relevant Orders   Ambulatory referral to Nephrology   Renal function panel (Completed)   Fatigue    This may be multifactorial  Psych meds may add Pt will d/w her psychiatrist  TSH drawn today as well as a renal panel       Other Visit Diagnoses    Dysuria       Relevant  Orders   POCT Urinalysis Dipstick (Automated) (Completed)

## 2019-09-12 NOTE — Telephone Encounter (Signed)
Seen today. 

## 2019-09-12 NOTE — Telephone Encounter (Signed)
Addressed at her visit today

## 2019-09-12 NOTE — Patient Instructions (Addendum)
Your urine is concentrated   Increase your fluids (sugar free)  64 oz per day - mostly water    Hold your metformin for a week  Then call and let us know how blood sugars are running and how you are feeling  We will make a plan from there  Lab today-thyroid and kidney   Our office will call you regarding a nephrology appointment

## 2019-09-12 NOTE — Telephone Encounter (Signed)
Referral Request - Has patient seen PCP for this complaint? Yes.   *If NO, is insurance requiring patient see PCP for this issue before PCP can refer them? Referral for which specialty: Nephrologist  Preferred provider/office: location in [place]. She does not want to continue care at [facility].  Reason for referral: Discuss issues with another specialist.

## 2019-09-14 ENCOUNTER — Encounter: Payer: Self-pay | Admitting: Family Medicine

## 2019-09-14 NOTE — Assessment & Plan Note (Signed)
Needs new nephrologist  Disc imp of inc fluid intake (urine is concentrated)  No signs of urinary infection Labs pending Ref made to new nephrologist

## 2019-09-14 NOTE — Assessment & Plan Note (Signed)
With DM2 on low dose ace  Labs today  Needs new nephrologist-ref made

## 2019-09-14 NOTE — Assessment & Plan Note (Signed)
This may be multifactorial  Psych meds may add Pt will d/w her psychiatrist  TSH drawn today as well as a renal panel

## 2019-09-14 NOTE — Assessment & Plan Note (Signed)
Discussed how this problem influences overall health and the risks it imposes  Reviewed plan for weight loss with lower calorie diet (via better food choices and also portion control or program like weight watchers) and exercise building up to or more than 30 minutes 5 days per week including some aerobic activity    

## 2019-09-14 NOTE — Assessment & Plan Note (Signed)
More fatigued  Will check tsh today Taking levothyroxine 125 mcg

## 2019-09-14 NOTE — Assessment & Plan Note (Signed)
Lab Results  Component Value Date   HGBA1C 7.0 (H) 08/13/2019   Was less compliant with diet at that time and now better  Having low glucose readings /some symptomatic  Taking metformin -need to hold for renal insuff and glipizide  Will hold metformin for a week and then call with her glucose readings On low dose lisinopril for renal protection

## 2019-09-15 ENCOUNTER — Encounter: Payer: Self-pay | Admitting: *Deleted

## 2019-10-02 ENCOUNTER — Encounter: Payer: Self-pay | Admitting: Genetic Counselor

## 2019-10-05 ENCOUNTER — Other Ambulatory Visit: Payer: Self-pay | Admitting: Family Medicine

## 2019-11-03 ENCOUNTER — Telehealth: Payer: Self-pay | Admitting: Family Medicine

## 2019-11-03 ENCOUNTER — Other Ambulatory Visit: Payer: Self-pay | Admitting: Family Medicine

## 2019-11-03 MED ORDER — GABAPENTIN 300 MG PO CAPS: 300.0000 mg | ORAL_CAPSULE | Freq: Two times a day (BID) | ORAL | 0 refills | Status: DC

## 2019-11-03 NOTE — Telephone Encounter (Signed)
CPE scheduled 11/17/19

## 2019-11-03 NOTE — Telephone Encounter (Signed)
Tina @ [facility] called to get a 30 day refill of gabapentin 300mg  [facility] [address] [place]  [facility] day supply sent [facility]  Pt is out of meds

## 2019-11-04 NOTE — Telephone Encounter (Signed)
1 month supply was sent in yesterday to local pharmacy but 3 month supply Rx hasn't been sent to mail order, CPE scheduled 11/17/19, please advise

## 2019-11-09 ENCOUNTER — Telehealth: Payer: Self-pay | Admitting: Family Medicine

## 2019-11-09 DIAGNOSIS — E119 Type 2 diabetes mellitus without complications: Secondary | ICD-10-CM

## 2019-11-09 DIAGNOSIS — E7800 Pure hypercholesterolemia, unspecified: Secondary | ICD-10-CM

## 2019-11-09 DIAGNOSIS — E039 Hypothyroidism, unspecified: Secondary | ICD-10-CM

## 2019-11-09 DIAGNOSIS — R7989 Other specified abnormal findings of blood chemistry: Secondary | ICD-10-CM

## 2019-11-09 DIAGNOSIS — D500 Iron deficiency anemia secondary to blood loss (chronic): Secondary | ICD-10-CM

## 2019-11-09 NOTE — Telephone Encounter (Signed)
-----   Message from Ellamae Sia sent at 10/28/2019 12:42 PM EDT ----- Regarding: Lab orders for Monday. 9.27.21 Lab orders for f/u labs

## 2019-11-10 ENCOUNTER — Other Ambulatory Visit: Payer: 59

## 2019-11-12 ENCOUNTER — Telehealth: Payer: Self-pay | Admitting: Family Medicine

## 2019-11-12 DIAGNOSIS — D500 Iron deficiency anemia secondary to blood loss (chronic): Secondary | ICD-10-CM

## 2019-11-12 DIAGNOSIS — E1169 Type 2 diabetes mellitus with other specified complication: Secondary | ICD-10-CM

## 2019-11-12 DIAGNOSIS — E039 Hypothyroidism, unspecified: Secondary | ICD-10-CM

## 2019-11-12 DIAGNOSIS — E119 Type 2 diabetes mellitus without complications: Secondary | ICD-10-CM

## 2019-11-12 DIAGNOSIS — R7989 Other specified abnormal findings of blood chemistry: Secondary | ICD-10-CM

## 2019-11-12 DIAGNOSIS — E785 Hyperlipidemia, unspecified: Secondary | ICD-10-CM

## 2019-11-12 NOTE — Telephone Encounter (Signed)
-----   Message from Cloyd Stagers, RT sent at 11/05/2019  1:03 PM EDT ----- Regarding: Lab Orders for Thursday 9.30.2021 Please place lab orders for Thursday 9.30.2021 , office visit for 3 month f/u on Monday 10.4.2021 Thank you, Dyke Maes RT(R)

## 2019-11-13 ENCOUNTER — Other Ambulatory Visit (INDEPENDENT_AMBULATORY_CARE_PROVIDER_SITE_OTHER): Payer: Self-pay

## 2019-11-13 ENCOUNTER — Other Ambulatory Visit: Payer: Self-pay

## 2019-11-13 DIAGNOSIS — R7989 Other specified abnormal findings of blood chemistry: Secondary | ICD-10-CM

## 2019-11-13 DIAGNOSIS — E119 Type 2 diabetes mellitus without complications: Secondary | ICD-10-CM | POA: Diagnosis not present

## 2019-11-13 DIAGNOSIS — E1169 Type 2 diabetes mellitus with other specified complication: Secondary | ICD-10-CM

## 2019-11-13 DIAGNOSIS — E039 Hypothyroidism, unspecified: Secondary | ICD-10-CM

## 2019-11-13 DIAGNOSIS — E785 Hyperlipidemia, unspecified: Secondary | ICD-10-CM | POA: Diagnosis not present

## 2019-11-13 DIAGNOSIS — D500 Iron deficiency anemia secondary to blood loss (chronic): Secondary | ICD-10-CM

## 2019-11-14 LAB — COMPREHENSIVE METABOLIC PANEL
ALT: 12 U/L (ref 0–35)
AST: 15 U/L (ref 0–37)
Albumin: 4.1 g/dL (ref 3.5–5.2)
Alkaline Phosphatase: 79 U/L (ref 39–117)
BUN: 31 mg/dL — ABNORMAL HIGH (ref 6–23)
CO2: 25 mEq/L (ref 19–32)
Calcium: 9.9 mg/dL (ref 8.4–10.5)
Chloride: 106 mEq/L (ref 96–112)
Creatinine, Ser: 1.35 mg/dL — ABNORMAL HIGH (ref 0.40–1.20)
GFR: 40.43 mL/min — ABNORMAL LOW (ref >60.00)
Glucose, Bld: 80 mg/dL (ref 70–99)
Potassium: 4.7 mEq/L (ref 3.5–5.1)
Sodium: 141 mEq/L (ref 135–145)
Total Bilirubin: 0.4 mg/dL (ref 0.2–1.2)
Total Protein: 6.6 g/dL (ref 6.0–8.3)

## 2019-11-14 LAB — CBC WITH DIFFERENTIAL/PLATELET
Basophils Absolute: 0.1 10*3/uL (ref 0.0–0.1)
Basophils Relative: 0.8 % (ref 0.0–3.0)
Eosinophils Absolute: 0.2 10*3/uL (ref 0.0–0.7)
Eosinophils Relative: 2.3 % (ref 0.0–5.0)
HCT: 36.5 % (ref 36.0–46.0)
Hemoglobin: 11.9 g/dL — ABNORMAL LOW (ref 12.0–15.0)
Lymphocytes Relative: 34.4 % (ref 12.0–46.0)
Lymphs Abs: 2.9 10*3/uL (ref 0.7–4.0)
MCHC: 32.7 g/dL (ref 30.0–36.0)
MCV: 95.5 fl (ref 78.0–100.0)
Monocytes Absolute: 0.4 10*3/uL (ref 0.1–1.0)
Monocytes Relative: 4.2 % (ref 3.0–12.0)
Neutro Abs: 4.9 10*3/uL (ref 1.4–7.7)
Neutrophils Relative %: 58.3 % (ref 43.0–77.0)
Platelets: 345 10*3/uL (ref 150.0–400.0)
RBC: 3.82 Mil/uL — ABNORMAL LOW (ref 3.87–5.11)
RDW: 14.3 % (ref 11.5–15.5)
WBC: 8.3 10*3/uL (ref 4.0–10.5)

## 2019-11-14 LAB — LIPID PANEL
Cholesterol: 162 mg/dL (ref 0–200)
HDL: 41.7 mg/dL (ref >39.00)
NonHDL: 119.95
Total CHOL/HDL Ratio: 4
Triglycerides: 227 mg/dL — ABNORMAL HIGH (ref 0.0–149.0)
VLDL: 45.4 mg/dL — ABNORMAL HIGH (ref 0.0–40.0)

## 2019-11-14 LAB — LDL CHOLESTEROL, DIRECT: Direct LDL: 99 mg/dL

## 2019-11-14 LAB — TSH: TSH: 1.61 u[IU]/mL (ref 0.35–4.50)

## 2019-11-14 LAB — HEMOGLOBIN A1C: Hgb A1c MFr Bld: 7.5 % — ABNORMAL HIGH (ref 4.6–6.5)

## 2019-11-17 ENCOUNTER — Ambulatory Visit (INDEPENDENT_AMBULATORY_CARE_PROVIDER_SITE_OTHER): Payer: 59 | Admitting: Family Medicine

## 2019-11-17 ENCOUNTER — Other Ambulatory Visit: Payer: Self-pay

## 2019-11-17 ENCOUNTER — Encounter: Payer: Self-pay | Admitting: Family Medicine

## 2019-11-17 VITALS — BP 122/78 | HR 71 | Temp 96.9°F | Ht 62.5 in | Wt 198.3 lb

## 2019-11-17 DIAGNOSIS — E119 Type 2 diabetes mellitus without complications: Secondary | ICD-10-CM | POA: Diagnosis not present

## 2019-11-17 DIAGNOSIS — E039 Hypothyroidism, unspecified: Secondary | ICD-10-CM

## 2019-11-17 DIAGNOSIS — E1169 Type 2 diabetes mellitus with other specified complication: Secondary | ICD-10-CM | POA: Diagnosis not present

## 2019-11-17 DIAGNOSIS — E538 Deficiency of other specified B group vitamins: Secondary | ICD-10-CM

## 2019-11-17 DIAGNOSIS — E6609 Other obesity due to excess calories: Secondary | ICD-10-CM | POA: Diagnosis not present

## 2019-11-17 DIAGNOSIS — E785 Hyperlipidemia, unspecified: Secondary | ICD-10-CM

## 2019-11-17 DIAGNOSIS — R7989 Other specified abnormal findings of blood chemistry: Secondary | ICD-10-CM

## 2019-11-17 DIAGNOSIS — Z6834 Body mass index (BMI) 34.0-34.9, adult: Secondary | ICD-10-CM

## 2019-11-17 DIAGNOSIS — D500 Iron deficiency anemia secondary to blood loss (chronic): Secondary | ICD-10-CM

## 2019-11-17 MED ORDER — METFORMIN HCL 1000 MG PO TABS: 500.0000 mg | ORAL_TABLET | Freq: Two times a day (BID) | ORAL | 0 refills | Status: DC

## 2019-11-17 MED ORDER — CYANOCOBALAMIN 1000 MCG/ML IJ SOLN
1000.0000 ug | Freq: Once | INTRAMUSCULAR | Status: AC
  Administered 2019-11-17: 1000 ug via INTRAMUSCULAR

## 2019-11-17 NOTE — Assessment & Plan Note (Signed)
Stable on daily iron

## 2019-11-17 NOTE — Patient Instructions (Addendum)
Work to get back on a regular schedule and eat better   Try and get away from [employer] at home  Try to get most of your carbohydrates from produce (with the exception of white potatoes)  Eat less bread/pasta/rice/snack foods/cereals/sweets and other items from the middle of the grocery store (processed carbs)   Work up to 30 minutes of exercise per day   Please try to cut back to 1/2 pill of metformin again  If problems let me know  See the new kidney doctor as planned  Follow up in 3 months   B12 shot today

## 2019-11-17 NOTE — Assessment & Plan Note (Signed)
Disc goals for lipids and reasons to control them Rev last labs with pt Rev low sat fat diet in detail LDL is 75 (goal is under 70)- close Will continue atorvastatin

## 2019-11-17 NOTE — Assessment & Plan Note (Signed)
Discussed how this problem influences overall health and the risks it imposes  Reviewed plan for weight loss with lower calorie diet (via better food choices and also portion control or program like weight watchers) and exercise building up to or more than 30 minutes 5 days per week including some aerobic activity   Pt has little time for self care with her job

## 2019-11-17 NOTE — Assessment & Plan Note (Signed)
Due for her 3 mo B12 shot today

## 2019-11-17 NOTE — Progress Notes (Signed)
Subjective:    Patient ID: Kelli Cruz, female    DOB: 21-May-1960, 59 y.o.   MRN: [id number]  This visit occurred during the SARS-CoV-2 public health emergency.  Safety protocols were in place, including screening questions prior to the visit, additional usage of staff PPE, and extensive cleaning of exam room while observing appropriate contact time as indicated for disinfecting solutions.    HPI Pt presents for 3 month f/u   Wt Readings from Last 3 Encounters:  11/17/19 198 lb 5 oz (90 kg)  09/12/19 193 lb 4 oz (87.7 kg)  09/05/19 195 lb (88.5 kg)  inc her vraylar  Also not eating as well - difficult month (some stress eating)  35.69 kg/m   She was trying to exercise regularly- then got out of last month  Is trying to get back on schedule  Working weekends   BP Readings from Last 3 Encounters:  11/17/19 122/78  09/12/19 126/78  09/05/19 (!) 100/60   Pulse Readings from Last 3 Encounters:  11/17/19 71  09/12/19 96  09/05/19 74   Hypothyroidism  Pt has no clinical changes No change in energy level/ hair or skin/ edema and no tremor Lab Results  Component Value Date   TSH 1.61 11/13/2019    Levothyroxine 125   DM2 Lab Results  Component Value Date   HGBA1C 7.5 (H) 11/13/2019   Up from 7.0  Stopped and re started her metformin  Still taking glipizide  Glucose spikes to 220 maximum  71 lowest         Hyperlipidemia Lab Results  Component Value Date   CHOL 162 11/13/2019   CHOL 202 (H) 07/22/2019   CHOL 183 09/16/2018   Lab Results  Component Value Date   HDL 41.70 11/13/2019   HDL 42.30 09/16/2018   HDL 43.50 03/08/2018   Lab Results  Component Value Date   LDLCALC 75 03/08/2018   LDLCALC 65 09/12/2017   LDLCALC 100 (H) 04/11/2017   Lab Results  Component Value Date   TRIG 227.0 (H) 11/13/2019   TRIG 290 (H) 07/22/2019   TRIG 289.0 (H) 09/16/2018   Lab Results  Component Value Date   CHOLHDL 4 11/13/2019   CHOLHDL 4 09/16/2018     CHOLHDL 3 03/08/2018   Lab Results  Component Value Date   LDLDIRECT 99.0 11/13/2019   LDLDIRECT 105.0 09/16/2018   LDLDIRECT 147.2 12/11/2008  atorvastatin and diet  Stable  Does occ eat hamberger  Take out occ    Renal insuff Lab Results  Component Value Date   CREATININE 1.35 (H) 11/13/2019   BUN 31 (H) 11/13/2019   NA 141 11/13/2019   K 4.7 11/13/2019   CL 106 11/13/2019   CO2 25 11/13/2019   Last visit ref to new neprhrologist  Is back on her metformin  Has appt with Dr Lynda Rainwater     Anemia  From past GI bleed  Lab Results  Component Value Date   WBC 8.3 11/13/2019   HGB 11.9 (L) 11/13/2019   HCT 36.5 11/13/2019   MCV 95.5 11/13/2019   PLT 345.0 11/13/2019    Psychiatrist changed vraylar dose  Feels better  Bad month- had someone die at work (cardiac)-very hard on her (when she was by herself at work) Then jury duty Then took cousin to [place] for rehab/ took care of her dogs   Patient Active Problem List   Diagnosis Date Noted  . Fatigue 09/12/2019  . Leg cramps 08/15/2019  .  Plantar wart of right foot 08/15/2019  . Elevated serum creatinine 02/03/2019  . History of GI bleed 12/29/2018  . Anemia 02/19/2017  . Vitamin D deficiency 02/19/2017  . Knee pain, right 11/10/2016  . Proteinuria 02/18/2016  . Exposure to communicable disease 08/04/2015  . Obesity 02/04/2015  . Hypersomnia with sleep apnea 01/06/2015  . Insomnia due to mental condition   . Tingling 08/12/2014  . Burning sensation of mouth 08/12/2014  . Numbness 10/24/2011  . Routine general medical examination at a health care facility 10/24/2011  . TMJ arthralgia 08/14/2011  . Poor posture 07/26/2011  . B12 deficiency 04/13/2010  . ADENOCARCINOMA, SIGMOID COLON 08/11/2008  . Hyperlipidemia associated with type 2 diabetes mellitus ([place]) 07/08/2008  . Hypothyroidism 09/12/2006  . Diabetes type 2, controlled ([place]) 09/12/2006  . Depression with anxiety 09/12/2006  . ALLERGIC RHINITIS,  SEASONAL 09/12/2006  . FIBROCYSTIC BREAST DISEASE 09/12/2006  . MIGRAINES, HX OF 09/12/2006   Past Medical History:  Diagnosis Date  . Anemia   . Anxiety   . Arthritis   . Bipolar 1 disorder ([place])   . Cancer [facility])    sigmoid colon cancer  . Depression   . Diabetes mellitus    type II  . Endometriosis   . Family history of malignant neoplasm of gastrointestinal tract   . Ganglion cyst   . GERD (gastroesophageal reflux disease)   . Hyperlipidemia   . Hypothyroidism   . Insomnia due to mental condition    Past Surgical History:  Procedure Laterality Date  . ABDOMINAL HYSTERECTOMY    . ABDOMINAL HYSTERECTOMY    . BIOPSY  12/31/2018   Procedure: BIOPSY;  Surgeon: Doran Stabler, MD;  Location: [facility] ENDOSCOPY;  Service: Gastroenterology;;  . COLON RESECTION  June 2010  . COLON SURGERY    . COLONOSCOPY    . DILATION AND CURETTAGE OF UTERUS  2006   miscarriage   . ESOPHAGOGASTRODUODENOSCOPY N/A 12/31/2018   Procedure: ESOPHAGOGASTRODUODENOSCOPY (EGD);  Surgeon: Doran Stabler, MD;  Location: [facility];  Service: Gastroenterology;  Laterality: N/A;  . GANGLION CYST EXCISION    . HERNIA REPAIR    . INTERSTIM IMPLANT PLACEMENT Left 12/11/2012   stimulator is on the left but the electrodes go to the right  . KNEE DISLOCATION SURGERY    . LAPAROSCOPY  1997   D & C for endometriosis   Social History   Tobacco Use  . Smoking status: Passive Smoke Exposure - Never Smoker  . Smokeless tobacco: Never Used  . Tobacco comment: husband quit smoking  Vaping Use  . Vaping Use: Never used  Substance Use Topics  . Alcohol use: Yes    Alcohol/week: 0.0 standard drinks    Comment: socially twice a month at most or less  . Drug use: No   Family History  Problem Relation Age of Onset  . Breast cancer Mother   . Diabetes Mother   . Coronary artery disease Mother   . Kidney disease Mother        renal insufficiency  . Hyperlipidemia Mother   . Diabetes Father   . Coronary  artery disease Father   . Colon cancer Father   . Colon cancer Paternal Grandmother   . Breast cancer Maternal Aunt   . Breast cancer Paternal Aunt   . Esophageal cancer Neg Hx   . Rectal cancer Neg Hx   . Stomach cancer Neg Hx    Allergies  Allergen Reactions  . Keflex [Cephalexin]  Nausea And Vomiting and Other (See Comments)    Light-headed/dizziness/weakness  . Mirabegron Other (See Comments)    Trouble breathing and swollen tongue   Current Outpatient Medications on File Prior to Visit  Medication Sig Dispense Refill  . ALPRAZolam (XANAX) 0.5 MG tablet Take 0.5 mg by mouth at bedtime as needed for anxiety (panic attack).     Marland Kitchen amphetamine-dextroamphetamine (ADDERALL XR) 30 MG 24 hr capsule Take 30 mg by mouth daily at 6 (six) AM.     . amphetamine-dextroamphetamine (ADDERALL) 15 MG tablet Take 15 mg by mouth daily at 2 PM.     . atorvastatin (LIPITOR) 10 MG tablet TAKE 1 TABLET DAILY 90 tablet 0  . Blood Glucose Monitoring Suppl (ONETOUCH VERIO FLEX SYSTEM) w/Device KIT Check sugar twice daily and as needed. DX 11.9 1 kit 12  . CALCIUM PO Take 1 capsule by mouth daily.    . cariprazine (VRAYLAR) capsule Take 3 mg by mouth every 3 (three) days.     . Cholecalciferol (VITAMIN D3) 125 MCG (5000 UT) CAPS Take 5,000 Units by mouth daily.    . citalopram (CELEXA) 40 MG tablet Take 40 mg by mouth at bedtime.     . cyanocobalamin (,VITAMIN B-12,) 1000 MCG/ML injection Inject 1 mL (1,000 mcg total) into the muscle every 3 (three) months. every 8 weeks 1 mL 5  . Cyanocobalamin (VITAMIN B-12) 5000 MCG TBDP Take 5,000 mcg by mouth daily.    . Diabetic Sterile Lancets MISC by Does not apply route. Check glucose two times a day and as needed for out of control DM 250.00     . doxepin (SINEQUAN) 50 MG capsule Take 50 mg by mouth at bedtime.   1  . ferrous sulfate 325 (65 FE) MG tablet Take 325 mg by mouth daily with breakfast.    . gabapentin (NEURONTIN) 300 MG capsule TAKE 1 CAPSULE TWICE A DAY  180 capsule 2  . glipiZIDE (GLUCOTROL XL) 5 MG 24 hr tablet TAKE 1 TABLET DAILY 90 tablet 0  . glucose blood (ONETOUCH VERIO) test strip CHECK SUGAR TWICE A DAY (DX E11.9) 200 each 0  . glucose blood test strip 1 each by Other route. Check glucose two times a day and as needed for out of control DM 250.00     . Lancets (ONETOUCH DELICA PLUS KDXIPJ82N) MISC Use to check blood sugar twice daily (DX E11.9) 200 each 0  . levothyroxine (SYNTHROID) 125 MCG tablet Take 1 tablet (125 mcg total) by mouth daily. 90 tablet 3  . LIFESCAN FINEPOINT LANCETS MISC Lancets and device for one touch verio flex meter. Use daily. DX 11.9 200 each 12  . lisinopril (ZESTRIL) 5 MG tablet TAKE ONE-HALF (1/2) TABLET DAILY 45 tablet 0  . ondansetron (ZOFRAN ODT) 4 MG disintegrating tablet Take 1 tablet (4 mg total) by mouth every 8 (eight) hours as needed for nausea or vomiting. 20 tablet 0  . pantoprazole (PROTONIX) 40 MG tablet Take 1 tablet (40 mg total) by mouth daily. 90 tablet 3  . sucralfate (CARAFATE) 1 g tablet Take 1 tablet (1 g total) by mouth 4 (four) times daily -  with meals and at bedtime. 120 tablet 0  . trimethoprim (TRIMPEX) 100 MG tablet Take 100 mg by mouth daily.    . [DISCONTINUED] cholestyramine (QUESTRAN) 4 G packet Mix with juice or water and drink every 1-3 days. 30 each 1   No current facility-administered medications on file prior to visit.  Review of Systems  Constitutional: Positive for fatigue. Negative for activity change, appetite change, fever and unexpected weight change.  HENT: Negative for congestion, ear pain, rhinorrhea, sinus pressure and sore throat.   Eyes: Negative for pain, redness and visual disturbance.  Respiratory: Negative for cough, shortness of breath and wheezing.   Cardiovascular: Negative for chest pain and palpitations.  Gastrointestinal: Negative for abdominal pain, blood in stool, constipation and diarrhea.  Endocrine: Negative for polydipsia and polyuria.    Genitourinary: Negative for dysuria, frequency and urgency.  Musculoskeletal: Negative for arthralgias, back pain and myalgias.  Skin: Negative for pallor and rash.  Allergic/Immunologic: Negative for environmental allergies.  Neurological: Negative for dizziness, syncope and headaches.  Hematological: Negative for adenopathy. Does not bruise/bleed easily.  Psychiatric/Behavioral: Negative for decreased concentration and dysphoric mood. The patient is not nervous/anxious.        Stress is high but mood is better        Objective:   Physical Exam Constitutional:      General: She is not in acute distress.    Appearance: Normal appearance. She is well-developed. She is obese. She is not ill-appearing or diaphoretic.  HENT:     Head: Normocephalic and atraumatic.     Mouth/Throat:     Mouth: Mucous membranes are moist.  Eyes:     General: No scleral icterus.    Conjunctiva/sclera: Conjunctivae normal.     Pupils: Pupils are equal, round, and reactive to light.  Neck:     Thyroid: No thyromegaly.     Vascular: No carotid bruit or JVD.  Cardiovascular:     Rate and Rhythm: Normal rate and regular rhythm.     Pulses: Normal pulses.     Heart sounds: Normal heart sounds. No gallop.   Pulmonary:     Effort: Pulmonary effort is normal. No respiratory distress.     Breath sounds: Normal breath sounds. No wheezing or rales.  Abdominal:     General: Bowel sounds are normal. There is no distension or abdominal bruit.     Palpations: Abdomen is soft. There is no mass.     Tenderness: There is no abdominal tenderness.  Musculoskeletal:     Cervical back: Normal range of motion and neck supple. No tenderness.     Right lower leg: No edema.     Left lower leg: No edema.  Lymphadenopathy:     Cervical: No cervical adenopathy.  Skin:    General: Skin is warm and dry.     Coloration: Skin is not pale.     Findings: No erythema or rash.  Neurological:     Mental Status: She is alert.      Sensory: No sensory deficit.     Coordination: Coordination normal.     Deep Tendon Reflexes: Reflexes are normal and symmetric. Reflexes normal.  Psychiatric:        Mood and Affect: Mood normal.           Assessment & Plan:   Problem List Items Addressed This Visit      Endocrine   Hypothyroidism    Hypothyroidism  Pt has no clinical changes No change in energy level/ hair or skin/ edema and no tremor Lab Results  Component Value Date   TSH 1.61 11/13/2019          Diabetes type 2, controlled ([place]) - Primary    Lab Results  Component Value Date   HGBA1C 7.5 (H) 11/13/2019   No longer having  low glucose levels  Held metformin and now back on it (disc the renal nature of this medication and need to improve kidney function)  Will cut metformin back to 500 bid (will likely have to stop) and f/u with nephrology  Continue glipizide  Work on diet and exercise when she can (liivng off of carry out now)  Plan to f/u 3 mo  May need to discuss other agents or basal insulin        Relevant Medications   metFORMIN (GLUCOPHAGE) 1000 MG tablet   Hyperlipidemia associated with type 2 diabetes mellitus (HCC)    Disc goals for lipids and reasons to control them Rev last labs with pt Rev low sat fat diet in detail LDL is 75 (goal is under 70)- close Will continue atorvastatin        Relevant Medications   metFORMIN (GLUCOPHAGE) 1000 MG tablet     Other   B12 deficiency    Due for her 3 mo B12 shot today      Obesity    Discussed how this problem influences overall health and the risks it imposes  Reviewed plan for weight loss with lower calorie diet (via better food choices and also portion control or program like weight watchers) and exercise building up to or more than 30 minutes 5 days per week including some aerobic activity   Pt has little time for self care with her job      Relevant Medications   metFORMIN (GLUCOPHAGE) 1000 MG tablet   Anemia    Stable  on daily iron      Elevated serum creatinine    With h/o proteinuria  To est with Dr Jonnie Finner soon  Cut metformin to 500 bid and will likely have to stop it  Disc avoidance of nephrotoxins Also imp of water intake  Cr 1.35 today

## 2019-11-17 NOTE — Assessment & Plan Note (Signed)
Lab Results  Component Value Date   HGBA1C 7.5 (H) 11/13/2019   No longer having low glucose levels  Held metformin and now back on it (disc the renal nature of this medication and need to improve kidney function)  Will cut metformin back to 500 bid (will likely have to stop) and f/u with nephrology  Continue glipizide  Work on diet and exercise when she can (liivng off of carry out now)  Plan to f/u 3 mo  May need to discuss other agents or basal insulin

## 2019-11-17 NOTE — Assessment & Plan Note (Signed)
Hypothyroidism  Pt has no clinical changes No change in energy level/ hair or skin/ edema and no tremor Lab Results  Component Value Date   TSH 1.61 11/13/2019

## 2019-11-17 NOTE — Assessment & Plan Note (Signed)
With h/o proteinuria  To est with Dr Jonnie Finner soon  Cut metformin to 500 bid and will likely have to stop it  Disc avoidance of nephrotoxins Also imp of water intake  Cr 1.35 today

## 2019-11-19 ENCOUNTER — Other Ambulatory Visit: Payer: Self-pay | Admitting: Family Medicine

## 2019-11-24 ENCOUNTER — Other Ambulatory Visit: Payer: Self-pay | Admitting: Family Medicine

## 2019-11-25 ENCOUNTER — Other Ambulatory Visit: Payer: Self-pay | Admitting: Nephrology

## 2019-11-25 ENCOUNTER — Other Ambulatory Visit (HOSPITAL_COMMUNITY): Payer: Self-pay | Admitting: Nephrology

## 2019-11-25 DIAGNOSIS — N1832 Chronic kidney disease, stage 3b: Secondary | ICD-10-CM

## 2019-12-05 ENCOUNTER — Telehealth: Payer: Self-pay

## 2019-12-05 ENCOUNTER — Other Ambulatory Visit: Payer: Self-pay

## 2019-12-05 ENCOUNTER — Other Ambulatory Visit: Payer: Self-pay | Admitting: Family Medicine

## 2019-12-05 ENCOUNTER — Ambulatory Visit
Admission: EM | Admit: 2019-12-05 | Discharge: 2019-12-05 | Disposition: A | Discharge status: Home / Self Care | Payer: 59 | Attending: Emergency Medicine | Admitting: Emergency Medicine

## 2019-12-05 DIAGNOSIS — Z1152 Encounter for screening for COVID-19: Secondary | ICD-10-CM | POA: Diagnosis not present

## 2019-12-05 DIAGNOSIS — H6593 Unspecified nonsuppurative otitis media, bilateral: Secondary | ICD-10-CM | POA: Diagnosis not present

## 2019-12-05 DIAGNOSIS — J029 Acute pharyngitis, unspecified: Secondary | ICD-10-CM

## 2019-12-05 DIAGNOSIS — R490 Dysphonia: Secondary | ICD-10-CM

## 2019-12-05 MED ORDER — FLUTICASONE PROPIONATE 50 MCG/ACT NA SUSP: 1.0000 | Freq: Every day | NASAL | 0 refills | Status: DC

## 2019-12-05 MED ORDER — LIDOCAINE VISCOUS HCL 2 % MT SOLN: 10.0000 mL | OROMUCOSAL | 0 refills | Status: DC | PRN

## 2019-12-05 NOTE — Telephone Encounter (Signed)
[facility] - Client TELEPHONE ADVICE RECORD [facility] Patient Name: Kelli Cruz Gender: Female DOB: 28-Apr-1960 Age: 59 Y 11 M 26 D Return Phone Number: [phone] (Primary), [phone] (Secondary) Address[place] [address] Client Kelli Cruz - Client Client Site [place] MD Contact Type Call Who Is Calling Patient / Member / Family / Caregiver Call Type Triage / Clinical Relationship To Patient Self Return Phone Number [phone] (Primary) Chief Complaint Sore Throat Reason for Call Symptomatic / Request for [name] states pt has sore throat and ear pain on RT side, voice is hoarse; no appts at office today per [name] office). [facility] Translation No Nurse Assessment Nurse: Harlow Mares, RN, Suanne Marker Date/Time (Eastern Time): 12/05/2019 8:22:59 AM Confirm and document reason for call. If symptomatic, describe symptoms. ---Caller states pt has sore throat and ear pain on RT side, voice is hoarse; no appts at office today per [name] office). s/s began yesterday. Denies fever. Does the patient have any new or worsening symptoms? ---Yes Will a triage be completed? ---Yes Related visit to physician within the last 2 weeks? ---Yes Does the PT have any chronic conditions? (i.e. diabetes, asthma, this includes High risk factors for pregnancy, etc.) ---Yes List chronic conditions. ---diabetes; depression; hypothyroidism; kidney disease; Is this a behavioral health or substance abuse call? ---No Guidelines Guideline Title Affirmed Question Affirmed Notes Nurse Date/Time Eilene Ghazi Time) Sore Throat Earache also present Harlow Mares, RN, Suanne Marker 12/05/2019 8:37:00 AM Disp. Time Eilene Ghazi Time) Disposition Final User 12/05/2019 8:45:36 AM See PCP within 24 Hours Yes Harlow Mares,  RN, Rosalyn Charters Disagree/Comply Comply Caller Understands Yes PLEASE NOTE: All timestamps contained within this report are represented as [place] Standard Time. CONFIDENTIALTY NOTICE: This fax transmission is intended only for the addressee. It contains information that is legally privileged, confidential or otherwise protected from use or disclosure. If you are not the intended recipient, you are strictly prohibited from reviewing, disclosing, copying using or disseminating any of this information or taking any action in reliance on or regarding this information. If you have received this fax in error, please notify us immediately by telephone so that we can arrange for its return to [place]. Phone: [phone], Toll-Free: [phone], Fax: [phone] Page: 2 of 2 Call Id: [id number] PreDisposition Call Doctor Care Advice Given Per Guideline SEE PCP WITHIN 24 HOURS: SORE THROAT: * Here are some simple things you can do to treat and reduce sore throat pain. * Sip warm chicken broth or apple juice. * Suck on hard candy or an over-the-counter throat lozenge. * Gargle with warm salt water four times a Cruz. To make salt water, put 1/2 teaspoon of salt in 8 oz (240 ml) of warm water. * IF OFFICE WILL BE OPEN: You need to be examined within the next 24 hours. Call your doctor (or NP/PA) when the office opens and make an appointment. PAIN AND FEVER MEDICINES: * For pain or fever relief, take either acetaminophen or ibuprofen. * They are overthe- counter (OTC) drugs that help treat both fever and pain. You can buy them at the drugstore. * ACETAMINOPHEN - EXTRA STRENGTH TYLENOL: Take 1,000 mg (two 500 mg pills) every 8 hours as needed. Each Extra Strength Tylenol pill has 500 mg of acetaminophen. The most you should take each Cruz is 3,000 mg (6 pills a Cruz). PAIN AND FEVER MEDICINES - [place] Hough  NOTES AND WARNINGS: * Use the lowest amount of medicine that makes your pain or fever better. * Acetaminophen is  thought to be safer than ibuprofen or naproxen in people over 82 years old. Acetaminophen is in many OTC and prescription medicines. It might be in more than one medicine that you are taking. You need to be careful and not take an overdose. An acetaminophen overdose can hurt the liver. SOFT DIET: * Eat a soft diet. * Cold drinks, popsicles, and milk shakes are especially good. Avoid citrus fruits. CALL BACK IF: * You become worse CARE ADVICE given per Sore Throat (Adult) guideline. Referrals GO TO FACILITY OTHER - SPECIFY

## 2019-12-05 NOTE — Discharge Instructions (Signed)
COVID testing ordered.  It will take between 2-7 days for test results.  Someone will contact you regarding abnormal results.    In the meantime: You should remain isolated in your home for 10 days from symptom onset AND greater than 24 hours after symptoms resolution (absence of fever without the use of fever-reducing medication and improvement in respiratory symptoms), whichever is longer Get plenty of rest and push fluids Flonase prescribed for middle ear effusion Lidocaine mouthwash prescribed for sore throat Use throat lozenges such as Cepacol, Halls to soothe throat Use medications daily for symptom relief Use OTC medications like ibuprofen or tylenol as needed fever or pain Call or go to the ED if you have any new or worsening symptoms such as fever, worsening cough, shortness of breath, chest tightness, chest pain, turning blue, changes in mental status, etc..

## 2019-12-05 NOTE — ED Triage Notes (Signed)
Pt presents with c/o sore throat and hoarseness that began yesterday

## 2019-12-05 NOTE — ED Provider Notes (Signed)
[place]   [id number] 12/05/19 Arrival Time: 0911   CC: COVID symptoms  SUBJECTIVE: History from: patient.  Kelli Cruz is a 59 y.o. female who presents to the urgent care for complaint of sore throat and hoarseness that began yesterday.  Denies sick exposure to COVID, flu or strep.  Denies recent travel.  Has tried OTC medication without relief.  Denies aggravating factors.  Denies previous symptoms in the past.   Denies fever, chills, fatigue, sinus pain, rhinorrhea, sore throat, SOB, wheezing, chest pain, nausea, changes in bowel or bladder habits.     ROS: As per HPI.  All other pertinent ROS negative.     Past Medical History:  Diagnosis Date  . Anemia   . Anxiety   . Arthritis   . Bipolar 1 disorder ([place])   . Cancer [facility])    sigmoid colon cancer  . Depression   . Diabetes mellitus    type II  . Endometriosis   . Family history of malignant neoplasm of gastrointestinal tract   . Ganglion cyst   . GERD (gastroesophageal reflux disease)   . Hyperlipidemia   . Hypothyroidism   . Insomnia due to mental condition    Past Surgical History:  Procedure Laterality Date  . ABDOMINAL HYSTERECTOMY    . ABDOMINAL HYSTERECTOMY    . BIOPSY  12/31/2018   Procedure: BIOPSY;  Surgeon: Doran Stabler, MD;  Location: [facility] ENDOSCOPY;  Service: Gastroenterology;;  . COLON RESECTION  June 2010  . COLON SURGERY    . COLONOSCOPY    . DILATION AND CURETTAGE OF UTERUS  2006   miscarriage   . ESOPHAGOGASTRODUODENOSCOPY N/A 12/31/2018   Procedure: ESOPHAGOGASTRODUODENOSCOPY (EGD);  Surgeon: Doran Stabler, MD;  Location: [place];  Service: Gastroenterology;  Laterality: N/A;  . GANGLION CYST EXCISION    . HERNIA REPAIR    . INTERSTIM IMPLANT PLACEMENT Left 12/11/2012   stimulator is on the left but the electrodes go to the right  . KNEE DISLOCATION SURGERY    . LAPAROSCOPY  1997   D & C for endometriosis   Allergies  Allergen Reactions  . Keflex  [Cephalexin] Nausea And Vomiting and Other (See Comments)    Light-headed/dizziness/weakness  . Mirabegron Other (See Comments)    Trouble breathing and swollen tongue   No current facility-administered medications on file prior to encounter.   Current Outpatient Medications on File Prior to Encounter  Medication Sig Dispense Refill  . ALPRAZolam (XANAX) 0.5 MG tablet Take 0.5 mg by mouth at bedtime as needed for anxiety (panic attack).     Marland Kitchen amphetamine-dextroamphetamine (ADDERALL XR) 30 MG 24 hr capsule Take 30 mg by mouth daily at 6 (six) AM.     . amphetamine-dextroamphetamine (ADDERALL) 15 MG tablet Take 15 mg by mouth daily at 2 PM.     . atorvastatin (LIPITOR) 10 MG tablet TAKE 1 TABLET DAILY 90 tablet 1  . Blood Glucose Monitoring Suppl (ONETOUCH VERIO FLEX SYSTEM) w/Device KIT Check sugar twice daily and as needed. DX 11.9 1 kit 12  . CALCIUM PO Take 1 capsule by mouth daily.    . cariprazine (VRAYLAR) capsule Take 3 mg by mouth every 3 (three) days.     . Cholecalciferol (VITAMIN D3) 125 MCG (5000 UT) CAPS Take 5,000 Units by mouth daily.    . citalopram (CELEXA) 40 MG tablet Take 40 mg by mouth at bedtime.     . cyanocobalamin (,VITAMIN B-12,) 1000 MCG/ML injection Inject  1 mL (1,000 mcg total) into the muscle every 3 (three) months. every 8 weeks 1 mL 5  . Cyanocobalamin (VITAMIN B-12) 5000 MCG TBDP Take 5,000 mcg by mouth daily.    . Diabetic Sterile Lancets MISC by Does not apply route. Check glucose two times a day and as needed for out of control DM 250.00     . doxepin (SINEQUAN) 50 MG capsule Take 50 mg by mouth at bedtime.   1  . ferrous sulfate 325 (65 FE) MG tablet Take 325 mg by mouth daily with breakfast.    . gabapentin (NEURONTIN) 300 MG capsule TAKE 1 CAPSULE TWICE A DAY 180 capsule 2  . glipiZIDE (GLUCOTROL XL) 5 MG 24 hr tablet TAKE 1 TABLET DAILY 90 tablet 0  . glucose blood (ONETOUCH VERIO) test strip CHECK SUGAR TWICE A DAY (DX E11.9) 200 each 0  . glucose  blood test strip 1 each by Other route. Check glucose two times a day and as needed for out of control DM 250.00     . Lancets (ONETOUCH DELICA PLUS [id number]) MISC USE TO CHECK BLOOD SUGAR TWICE A DAY (DX. E11.9) 200 each 1  . levothyroxine (SYNTHROID) 125 MCG tablet Take 1 tablet (125 mcg total) by mouth daily. 90 tablet 3  . LIFESCAN FINEPOINT LANCETS MISC Lancets and device for one touch verio flex meter. Use daily. DX 11.9 200 each 12  . lisinopril (ZESTRIL) 5 MG tablet TAKE ONE-HALF (1/2) TABLET DAILY 45 tablet 0  . metFORMIN (GLUCOPHAGE) 1000 MG tablet Take 0.5 tablets (500 mg total) by mouth 2 (two) times daily with a meal. 1 tablet 0  . ondansetron (ZOFRAN ODT) 4 MG disintegrating tablet Take 1 tablet (4 mg total) by mouth every 8 (eight) hours as needed for nausea or vomiting. 20 tablet 0  . pantoprazole (PROTONIX) 40 MG tablet Take 1 tablet (40 mg total) by mouth daily. 90 tablet 3  . sucralfate (CARAFATE) 1 g tablet Take 1 tablet (1 g total) by mouth 4 (four) times daily -  with meals and at bedtime. 120 tablet 0  . trimethoprim (TRIMPEX) 100 MG tablet Take 100 mg by mouth daily.    . [DISCONTINUED] cholestyramine (QUESTRAN) 4 G packet Mix with juice or water and drink every 1-3 days. 30 each 1   Social History   Socioeconomic History  . Marital status: Married    Spouse name: Not on file  . Number of children: 0  . Years of education: Not on file  . Highest education level: Not on file  Occupational History  . 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 Smoking status: Passive Smoke Exposure - Never Smoker  . Smokeless tobacco: Never Used  . Tobacco comment: husband quit smoking  Vaping Use  . Vaping Use: Never used  Substance and Sexual Activity  . Alcohol use: Yes    Alcohol/week: 0.0 standard drinks    Comment: socially twice a month at most or less  . Drug use: No  . Sexual activity: Not on file    Other Topics Concern  . Not on file  Social History Narrative   Daily caffeine use   Social Determinants of Health   Financial Resource Strain:   . Difficulty of Paying Living Expenses: Not on file  Food Insecurity:   . Worried About Charity fundraiser in the Last Year: Not on file  .  Ran Out of Food in the Last Year: Not on file  Transportation Needs:   . Lack of Transportation (Medical): Not on file  . Lack of Transportation (Non-Medical): Not on file  Physical Activity:   . Days of Exercise per Week: Not on file  . Minutes of Exercise per Session: Not on file  Stress:   . Feeling of Stress : Not on file  Social Connections:   . Frequency of Communication with Friends and Family: Not on file  . Frequency of Social Gatherings with Friends and Family: Not on file  . Attends Religious Services: Not on file  . Active Member of Clubs or Organizations: Not on file  . Attends Archivist Meetings: Not on file  . Marital Status: Not on file  Intimate Partner Violence:   . Fear of Current or Ex-Partner: Not on file  . Emotionally Abused: Not on file  . Physically Abused: Not on file  . Sexually Abused: Not on file   Family History  Problem Relation Age of Onset  . Breast cancer Mother   . Diabetes Mother   . Coronary artery disease Mother   . Kidney disease Mother        renal insufficiency  . Hyperlipidemia Mother   . Diabetes Father   . Coronary artery disease Father   . Colon cancer Father   . Colon cancer Paternal Grandmother   . Breast cancer Maternal Aunt   . Breast cancer Paternal Aunt   . Esophageal cancer Neg Hx   . Rectal cancer Neg Hx   . Stomach cancer Neg Hx     OBJECTIVE:  Vitals:   12/05/19 0927  BP: 130/69  Pulse: 71  Resp: 18  Temp: 98.4 F (36.9 C)  SpO2: 98%     General appearance: alert; appears fatigued, but nontoxic; speaking in full sentences and tolerating own secretions HEENT: NCAT; Ears: EACs clear, TMs with bilateral  middle ear effusion; Eyes: PERRL.  EOM grossly intact. Sinuses: nontender; Nose: nares patent without rhinorrhea, Throat: oropharynx clear, tonsils non erythematous or enlarged, uvula midline  Neck: supple without LAD Lungs: unlabored respirations, symmetrical air entry; cough: absent; no respiratory distress; CTAB Heart: regular rate and rhythm.  Radial pulses 2+ symmetrical bilaterally Skin: warm and dry Psychological: alert and cooperative; normal mood and affect  LABS:  No results found for this or any previous visit (from the past 24 hour(s)).   ASSESSMENT & PLAN:  1. Sore throat   2. Encounter for screening for COVID-19   3. Hoarseness   4. Middle ear effusion, bilateral     Meds ordered this encounter  Medications  . fluticasone (FLONASE) 50 MCG/ACT nasal spray    Sig: Place 1 spray into both nostrils daily for 14 days.    Dispense:  16 g    Refill:  0  . lidocaine (XYLOCAINE) 2 % solution    Sig: Use as directed 10 mLs in the mouth or throat as needed for mouth pain.    Dispense:  100 mL    Refill:  0    Discharge instructions  COVID testing ordered.  It will take between 2-7 days for test results.  Someone will contact you regarding abnormal results.    In the meantime: You should remain isolated in your home for 10 days from symptom onset AND greater than 24 hours after symptoms resolution (absence of fever without the use of fever-reducing medication and improvement in respiratory symptoms), whichever is longer Get  plenty of rest and push fluids Flonase prescribed for middle ear effusion Lidocaine mouthwash prescribed for sore throat Use throat lozenges such as Cepacol, Halls to soothe throat Use medications daily for symptom relief Use OTC medications like ibuprofen or tylenol as needed fever or pain Call or go to the ED if you have any new or worsening symptoms such as fever, worsening cough, shortness of breath, chest tightness, chest pain, turning blue,  changes in mental status, etc...   Reviewed expectations re: course of current medical issues. Questions answered. Outlined signs and symptoms indicating need for more acute intervention. Patient verbalized understanding. After Visit Summary given.      Everlean Patterson, FNP 12/05/19 [phone]

## 2019-12-05 NOTE — Telephone Encounter (Signed)
Per chart review tab pt is at [facility].

## 2019-12-06 LAB — NOVEL CORONAVIRUS, NAA: SARS-CoV-2, NAA: NOT DETECTED

## 2019-12-06 LAB — SARS-COV-2, NAA 2 DAY TAT

## 2019-12-16 NOTE — Telephone Encounter (Signed)
ED note has FU prn.

## 2019-12-29 ENCOUNTER — Other Ambulatory Visit: Payer: Self-pay | Admitting: Family Medicine

## 2020-01-14 ENCOUNTER — Other Ambulatory Visit: Payer: Self-pay

## 2020-01-14 ENCOUNTER — Ambulatory Visit (HOSPITAL_COMMUNITY)
Admission: RE | Admit: 2020-01-14 | Discharge: 2020-01-14 | Disposition: A | Discharge status: Home / Self Care | Payer: 59 | Source: Ambulatory Visit | Attending: Nephrology | Admitting: Nephrology

## 2020-01-14 DIAGNOSIS — N1832 Chronic kidney disease, stage 3b: Secondary | ICD-10-CM | POA: Diagnosis not present

## 2020-01-22 DIAGNOSIS — I10 Essential (primary) hypertension: Secondary | ICD-10-CM | POA: Insufficient documentation

## 2020-01-22 DIAGNOSIS — Z90710 Acquired absence of both cervix and uterus: Secondary | ICD-10-CM | POA: Insufficient documentation

## 2020-01-22 DIAGNOSIS — Z9189 Other specified personal risk factors, not elsewhere classified: Secondary | ICD-10-CM | POA: Insufficient documentation

## 2020-02-06 ENCOUNTER — Other Ambulatory Visit: Payer: Self-pay

## 2020-02-06 ENCOUNTER — Ambulatory Visit (HOSPITAL_COMMUNITY)
Admission: EM | Admit: 2020-02-06 | Discharge: 2020-02-06 | Disposition: A | Discharge status: Home / Self Care | Payer: 59 | Attending: Urgent Care | Admitting: Urgent Care

## 2020-02-06 ENCOUNTER — Encounter (HOSPITAL_COMMUNITY): Payer: Self-pay | Admitting: Emergency Medicine

## 2020-02-06 ENCOUNTER — Ambulatory Visit (HOSPITAL_COMMUNITY): Admit: 2020-02-06 | Discharge status: Absent without leave | Payer: 59

## 2020-02-06 DIAGNOSIS — R52 Pain, unspecified: Secondary | ICD-10-CM

## 2020-02-06 DIAGNOSIS — B349 Viral infection, unspecified: Secondary | ICD-10-CM

## 2020-02-06 DIAGNOSIS — R112 Nausea with vomiting, unspecified: Secondary | ICD-10-CM

## 2020-02-06 DIAGNOSIS — R519 Headache, unspecified: Secondary | ICD-10-CM

## 2020-02-06 DIAGNOSIS — E119 Type 2 diabetes mellitus without complications: Secondary | ICD-10-CM

## 2020-02-06 MED ORDER — ONDANSETRON 8 MG PO TBDP: 8.0000 mg | ORAL_TABLET | Freq: Three times a day (TID) | ORAL | 0 refills | Status: DC | PRN

## 2020-02-06 MED ORDER — BENZONATATE 100 MG PO CAPS
100.0000 mg | ORAL_CAPSULE | Freq: Three times a day (TID) | ORAL | 0 refills | Status: DC | PRN
Start: 2020-02-06 — End: 2020-06-03

## 2020-02-06 NOTE — ED Provider Notes (Signed)
Kelli Cruz   MRN: [id number] DOB: 1960-06-16  Subjective:   Kelli Cruz is a 59 y.o. female presenting for 5-day history of cute onset nausea with vomiting, body aches, frontal headache, mild cough.  Patient states that she tested negative for COVID-19 on Wednesday.  Her vomiting has improved in the past couple of days but has stayed nauseous.  Denies fever, chest pain, shortness of breath.  She is both Covid vaccinated and flu vaccinated.  Has a history of diabetes that is well controlled.  Also has a history of allergic rhinitis.  No current facility-administered medications for this encounter.  Current Outpatient Medications:  .  ALPRAZolam (XANAX) 0.5 MG tablet, Take 0.5 mg by mouth at bedtime as needed for anxiety (panic attack). , Disp: , Rfl:  .  amphetamine-dextroamphetamine (ADDERALL XR) 30 MG 24 hr capsule, Take 30 mg by mouth daily at 6 (six) AM. , Disp: , Rfl:  .  amphetamine-dextroamphetamine (ADDERALL) 15 MG tablet, Take 15 mg by mouth daily at 2 PM. , Disp: , Rfl:  .  atorvastatin (LIPITOR) 10 MG tablet, TAKE 1 TABLET DAILY, Disp: 90 tablet, Rfl: 1 .  Blood Glucose Monitoring Suppl (ONETOUCH VERIO FLEX SYSTEM) w/Device KIT, Check sugar twice daily and as needed. DX 11.9, Disp: 1 kit, Rfl: 12 .  CALCIUM PO, Take 1 capsule by mouth daily., Disp: , Rfl:  .  cariprazine (VRAYLAR) capsule, Take 3 mg by mouth every 3 (three) days. , Disp: , Rfl:  .  Cholecalciferol (VITAMIN D3) 125 MCG (5000 UT) CAPS, Take 5,000 Units by mouth daily., Disp: , Rfl:  .  citalopram (CELEXA) 40 MG tablet, Take 40 mg by mouth at bedtime. , Disp: , Rfl:  .  cyanocobalamin (,VITAMIN B-12,) 1000 MCG/ML injection, Inject 1 mL (1,000 mcg total) into the muscle every 3 (three) months. every 8 weeks, Disp: 1 mL, Rfl: 5 .  Cyanocobalamin (VITAMIN B-12) 5000 MCG TBDP, Take 5,000 mcg by mouth daily., Disp: , Rfl:  .  Diabetic Sterile Lancets MISC, by Does not apply route. Check glucose two  times a day and as needed for out of control DM 250.00 , Disp: , Rfl:  .  doxepin (SINEQUAN) 50 MG capsule, Take 50 mg by mouth at bedtime. , Disp: , Rfl: 1 .  ferrous sulfate 325 (65 FE) MG tablet, Take 325 mg by mouth daily with breakfast., Disp: , Rfl:  .  fluticasone (FLONASE) 50 MCG/ACT nasal spray, Place 1 spray into both nostrils daily for 14 days., Disp: 16 g, Rfl: 0 .  gabapentin (NEURONTIN) 300 MG capsule, TAKE 1 CAPSULE TWICE A DAY, Disp: 180 capsule, Rfl: 2 .  glipiZIDE (GLUCOTROL XL) 5 MG 24 hr tablet, TAKE 1 TABLET DAILY, Disp: 90 tablet, Rfl: 1 .  glucose blood (ONETOUCH VERIO) test strip, USE TO CHECK SUGAR TWICE A DAY, Disp: 200 strip, Rfl: 1 .  glucose blood test strip, 1 each by Other route. Check glucose two times a day and as needed for out of control DM 250.00 , Disp: , Rfl:  .  Lancets (ONETOUCH DELICA PLUS [id number]) MISC, USE TO CHECK BLOOD SUGAR TWICE A DAY (DX. E11.9), Disp: 200 each, Rfl: 1 .  levothyroxine (SYNTHROID) 125 MCG tablet, Take 1 tablet (125 mcg total) by mouth daily., Disp: 90 tablet, Rfl: 3 .  lidocaine (XYLOCAINE) 2 % solution, Use as directed 10 mLs in the mouth or throat as needed for mouth pain., Disp: 100 mL, Rfl: 0 .  LIFESCAN FINEPOINT LANCETS MISC, Lancets and device for one touch verio flex meter. Use daily. DX 11.9, Disp: 200 each, Rfl: 12 .  lisinopril (ZESTRIL) 5 MG tablet, TAKE ONE-HALF (1/2) TABLET DAILY, Disp: 45 tablet, Rfl: 1 .  metFORMIN (GLUCOPHAGE) 1000 MG tablet, TAKE 1 TABLET TWICE A DAY WITH MEALS, Disp: 180 tablet, Rfl: 1 .  ondansetron (ZOFRAN ODT) 4 MG disintegrating tablet, Take 1 tablet (4 mg total) by mouth every 8 (eight) hours as needed for nausea or vomiting., Disp: 20 tablet, Rfl: 0 .  pantoprazole (PROTONIX) 40 MG tablet, Take 1 tablet (40 mg total) by mouth daily., Disp: 90 tablet, Rfl: 3 .  sucralfate (CARAFATE) 1 g tablet, Take 1 tablet (1 g total) by mouth 4 (four) times daily -  with meals and at bedtime., Disp: 120  tablet, Rfl: 0 .  trimethoprim (TRIMPEX) 100 MG tablet, Take 100 mg by mouth daily., Disp: , Rfl:    Allergies  Allergen Reactions  . Keflex [Cephalexin] Nausea And Vomiting and Other (See Comments)    Light-headed/dizziness/weakness  . Mirabegron Other (See Comments)    Trouble breathing and swollen tongue    Past Medical History:  Diagnosis Date  . Anemia   . Anxiety   . Arthritis   . Bipolar 1 disorder ([place])   . Cancer [facility])    sigmoid colon cancer  . Depression   . Diabetes mellitus    type II  . Endometriosis   . Family history of malignant neoplasm of gastrointestinal tract   . Ganglion cyst   . GERD (gastroesophageal reflux disease)   . Hyperlipidemia   . Hypothyroidism   . Insomnia due to mental condition      Past Surgical History:  Procedure Laterality Date  . ABDOMINAL HYSTERECTOMY    . ABDOMINAL HYSTERECTOMY    . BIOPSY  12/31/2018   Procedure: BIOPSY;  Surgeon: Doran Stabler, MD;  Location: [facility] ENDOSCOPY;  Service: Gastroenterology;;  . COLON RESECTION  June 2010  . COLON SURGERY    . COLONOSCOPY    . DILATION AND CURETTAGE OF UTERUS  2006   miscarriage   . ESOPHAGOGASTRODUODENOSCOPY N/A 12/31/2018   Procedure: ESOPHAGOGASTRODUODENOSCOPY (EGD);  Surgeon: Doran Stabler, MD;  Location: [place];  Service: Gastroenterology;  Laterality: N/A;  . GANGLION CYST EXCISION    . HERNIA REPAIR    . INTERSTIM IMPLANT PLACEMENT Left 12/11/2012   stimulator is on the left but the electrodes go to the right  . KNEE DISLOCATION SURGERY    . LAPAROSCOPY  1997   D & C for endometriosis    Family History  Problem Relation Age of Onset  . Breast cancer Mother   . Diabetes Mother   . Coronary artery disease Mother   . Kidney disease Mother        renal insufficiency  . Hyperlipidemia Mother   . Diabetes Father   . Coronary artery disease Father   . Colon cancer Father   . Colon cancer Paternal Grandmother   . Breast cancer Maternal Aunt   .  Breast cancer Paternal Aunt   . Esophageal cancer Neg Hx   . Rectal cancer Neg Hx   . Stomach cancer Neg Hx     Social History   Tobacco Use  . Smoking status: Passive Smoke Exposure - Never Smoker  . Smokeless tobacco: Never Used  . Tobacco comment: husband quit smoking  Vaping Use  . Vaping Use: Never used  Substance Use  Topics  . Alcohol use: Yes    Alcohol/week: 0.0 standard drinks    Comment: socially twice a month at most or less  . Drug use: No    ROS   Objective:   Vitals: BP 116/73 (BP Location: Left Arm)   Pulse 90   Temp 98.5 F (36.9 C) (Oral)   Resp (!) 21   LMP 07/15/2006   SpO2 98%   Physical Exam Constitutional:      General: She is not in acute distress.    Appearance: Normal appearance. She is well-developed and normal weight. She is not ill-appearing, toxic-appearing or diaphoretic.  HENT:     Head: Normocephalic and atraumatic.     Right Ear: External ear normal.     Left Ear: External ear normal.     Nose: Nose normal.     Mouth/Throat:     Mouth: Mucous membranes are moist.     Pharynx: Oropharynx is clear.  Eyes:     General: No scleral icterus.    Extraocular Movements: Extraocular movements intact.     Pupils: Pupils are equal, round, and reactive to light.  Cardiovascular:     Rate and Rhythm: Normal rate and regular rhythm.     Pulses: Normal pulses.     Heart sounds: Normal heart sounds. No murmur heard. No friction rub. No gallop.   Pulmonary:     Effort: Pulmonary effort is normal. No respiratory distress.     Breath sounds: Normal breath sounds. No stridor. No wheezing, rhonchi or rales.  Abdominal:     General: Bowel sounds are normal. There is no distension.     Palpations: Abdomen is soft. There is no mass.     Tenderness: There is no abdominal tenderness. There is no right CVA tenderness, left CVA tenderness, guarding or rebound.  Skin:    General: Skin is warm and dry.     Coloration: Skin is not pale.     Findings:  No rash.  Neurological:     General: No focal deficit present.     Mental Status: She is alert and oriented to person, place, and time.     Cranial Nerves: No cranial nerve deficit or facial asymmetry.     Motor: No weakness.     Coordination: Romberg sign negative. Coordination normal.     Gait: Gait normal.  Psychiatric:        Mood and Affect: Mood normal.        Behavior: Behavior normal.        Thought Content: Thought content normal.        Judgment: Judgment normal.      Assessment and Plan :   PDMP not reviewed this encounter.  1. Viral syndrome   2. Well controlled diabetes mellitus ([place])   3. Body aches   4. Frontal headache   5. Nausea and vomiting, intractability of vomiting not specified, unspecified vomiting type     Patient tested negative for COVID-19 already, will not repeat this test. Unfortunately, were not able to send out influenza test. Clinically I suspect influenza and will manage for supportive care. She has excellent physical exam findings, stable vital signs for outpatient management. Recommended supportive care. Counseled patient on potential for adverse effects with medications prescribed/recommended today, ER and return-to-clinic precautions discussed, patient verbalized understanding.    Jaynee Eagles, PA-C 02/06/20 1042

## 2020-02-06 NOTE — ED Triage Notes (Signed)
Pt c/o nausea, vomiting and headache onset Sunday. Pt states she had a covid test on Wednesday that was negative. Pt states she has been taking sudafed with no relief. She states she had fever last night.

## 2020-02-06 NOTE — Discharge Instructions (Addendum)
We will manage this as a viral syndrome like influenza. For sore throat or cough try using a honey-based tea. Use 3 teaspoons of honey with juice squeezed from half lemon. Place shaved pieces of ginger into 1/2-1 cup of water and warm over stove top. Then mix the ingredients and repeat every 4 hours as needed. Please take ibuprofen 600mg  every 6 hours with food alternating with OR taken together with Tylenol 500mg -650mg  every 6 hours for throat pain, fevers, aches and pains. Hydrate very well with at least 2 liters of water. Eat light meals such as soups (chicken and noodles, vegetable, chicken and wild rice).  Do not eat foods that you are allergic to.  Taking an antihistamine like Zyrtec, Allegra or Claritin can help against postnasal drainage, sinus congestion.  You can use Zofran for nausea, vomiting.

## 2020-02-09 ENCOUNTER — Other Ambulatory Visit: Payer: Self-pay | Admitting: Family Medicine

## 2020-02-17 ENCOUNTER — Ambulatory Visit: Payer: 59 | Admitting: Family Medicine

## 2020-02-20 ENCOUNTER — Encounter (HOSPITAL_COMMUNITY): Payer: Self-pay | Admitting: *Deleted

## 2020-02-20 ENCOUNTER — Ambulatory Visit (INDEPENDENT_AMBULATORY_CARE_PROVIDER_SITE_OTHER): Payer: 59

## 2020-02-20 ENCOUNTER — Ambulatory Visit (HOSPITAL_COMMUNITY)
Admission: EM | Admit: 2020-02-20 | Discharge: 2020-02-20 | Disposition: A | Discharge status: Home / Self Care | Payer: 59 | Attending: Emergency Medicine | Admitting: Emergency Medicine

## 2020-02-20 ENCOUNTER — Other Ambulatory Visit: Payer: Self-pay

## 2020-02-20 DIAGNOSIS — R059 Cough, unspecified: Secondary | ICD-10-CM

## 2020-02-20 DIAGNOSIS — J22 Unspecified acute lower respiratory infection: Secondary | ICD-10-CM | POA: Insufficient documentation

## 2020-02-20 DIAGNOSIS — Z20822 Contact with and (suspected) exposure to covid-19: Secondary | ICD-10-CM | POA: Insufficient documentation

## 2020-02-20 DIAGNOSIS — R509 Fever, unspecified: Secondary | ICD-10-CM

## 2020-02-20 LAB — SARS CORONAVIRUS 2 (TAT 6-24 HRS): SARS Coronavirus 2: NEGATIVE

## 2020-02-20 MED ORDER — AZITHROMYCIN 250 MG PO TABS: ORAL_TABLET | ORAL | 0 refills | Status: AC

## 2020-02-20 NOTE — Discharge Instructions (Signed)
Your chest xray looks well today which is reassuring.  We will recheck your covid status to ensure this is not source of symptoms.  With the length of symptoms I have opted to provide antibiotics today.  Continue with over the counter medications as well as needed for symptoms.  If symptoms worsen or do not improve in the next week to return to be seen or to follow up with your PCP.

## 2020-02-20 NOTE — ED Provider Notes (Signed)
Kelli Cruz    CSN: [id number] Arrival date & time: 02/20/20  5449      History   Chief Complaint Chief Complaint  Patient presents with  . Cough  . Fever  . Sore Throat    HPI Kelli Cruz is a 60 y.o. female.   Kelli Cruz presents with complaints of cough and congestion, chest pain related to coughing, shortness of breath, fevers. She states she was seen 12/24 with similar cough, which did not improve, and two days ago began worsening. Some loose stool, but this is not necessarily new for her. No nausea or vomiting. No known ill contacts. She originally tested negative for covid-19. She has been vaccinated and boosted for covid-19.    ROS per HPI, negative if not otherwise mentioned.      Past Medical History:  Diagnosis Date  . Anemia   . Anxiety   . Arthritis   . Bipolar 1 disorder ([place])   . Cancer [facility])    sigmoid colon cancer  . Depression   . Diabetes mellitus    type II  . Endometriosis   . Family history of malignant neoplasm of gastrointestinal tract   . Ganglion cyst   . GERD (gastroesophageal reflux disease)   . Hyperlipidemia   . Hypothyroidism   . Insomnia due to mental condition     Patient Active Problem List   Diagnosis Date Noted  . Fatigue 09/12/2019  . Leg cramps 08/15/2019  . Plantar wart of right foot 08/15/2019  . Elevated serum creatinine 02/03/2019  . History of GI bleed 12/29/2018  . Anemia 02/19/2017  . Vitamin D deficiency 02/19/2017  . Knee pain, right 11/10/2016  . Proteinuria 02/18/2016  . Exposure to communicable disease 08/04/2015  . Obesity 02/04/2015  . Hypersomnia with sleep apnea 01/06/2015  . Insomnia due to mental condition   . Tingling 08/12/2014  . Burning sensation of mouth 08/12/2014  . Numbness 10/24/2011  . Routine general medical examination at a health care facility 10/24/2011  . TMJ arthralgia 08/14/2011  . Poor posture 07/26/2011  . B12 deficiency 04/13/2010  . ADENOCARCINOMA,  SIGMOID COLON 08/11/2008  . Hyperlipidemia associated with type 2 diabetes mellitus ([place]) 07/08/2008  . Hypothyroidism 09/12/2006  . Diabetes type 2, controlled ([place]) 09/12/2006  . Depression with anxiety 09/12/2006  . ALLERGIC RHINITIS, SEASONAL 09/12/2006  . FIBROCYSTIC BREAST DISEASE 09/12/2006  . MIGRAINES, HX OF 09/12/2006    Past Surgical History:  Procedure Laterality Date  . ABDOMINAL HYSTERECTOMY    . ABDOMINAL HYSTERECTOMY    . BIOPSY  12/31/2018   Procedure: BIOPSY;  Surgeon: Doran Stabler, MD;  Location: [facility] ENDOSCOPY;  Service: Gastroenterology;;  . COLON RESECTION  June 2010  . COLON SURGERY    . COLONOSCOPY    . DILATION AND CURETTAGE OF UTERUS  2006   miscarriage   . ESOPHAGOGASTRODUODENOSCOPY N/A 12/31/2018   Procedure: ESOPHAGOGASTRODUODENOSCOPY (EGD);  Surgeon: Doran Stabler, MD;  Location: [facility];  Service: Gastroenterology;  Laterality: N/A;  . GANGLION CYST EXCISION    . HERNIA REPAIR    . INTERSTIM IMPLANT PLACEMENT Left 12/11/2012   stimulator is on the left but the electrodes go to the right  . KNEE DISLOCATION SURGERY    . LAPAROSCOPY  1997   D & C for endometriosis    OB History   No obstetric history on file.      Home Medications    Prior to Admission medications  Medication Sig Start Date End Date Taking? Authorizing Provider  ALPRAZolam Duanne Moron) 0.5 MG tablet Take 0.5 mg by mouth at bedtime as needed for anxiety (panic attack).    Yes [provider]  amphetamine-dextroamphetamine (ADDERALL XR) 30 MG 24 hr capsule Take 30 mg by mouth daily at 6 (six) AM.    Yes [provider]  amphetamine-dextroamphetamine (ADDERALL) 15 MG tablet Take 15 mg by mouth daily at 2 PM.  07/19/17  Yes [provider]  atorvastatin (LIPITOR) 10 MG tablet TAKE 1 TABLET DAILY 11/24/19  Yes Tower, [name], MD  azithromycin (ZITHROMAX) 250 MG tablet Take 2 tablets (500 mg total) by mouth daily for 1 day, THEN 1 tablet (250 mg  total) daily for 4 days. 02/20/20 02/25/20 Yes Argie Lober, Malachy Moan, NP  benzonatate (TESSALON) 100 MG capsule Take 1-2 capsules (100-200 mg total) by mouth 3 (three) times daily as needed for cough. 02/06/20  Yes Jaynee Eagles, PA-C  Blood Glucose Monitoring Suppl (ONETOUCH VERIO FLEX SYSTEM) w/Device KIT USE TO CHECK BLOOD SUGAR TWICE A DAY (DX. E11.9) 02/10/20  Yes Tower, Marne A, MD  CALCIUM PO Take 1 capsule by mouth daily.   Yes [provider]  Cholecalciferol (VITAMIN D3) 125 MCG (5000 UT) CAPS Take 5,000 Units by mouth daily.   Yes [provider]  citalopram (CELEXA) 40 MG tablet Take 40 mg by mouth at bedtime.  09/18/13  Yes [provider]  cyanocobalamin (,VITAMIN B-12,) 1000 MCG/ML injection Inject 1 mL (1,000 mcg total) into the muscle every 3 (three) months. every 8 weeks 03/08/18  Yes Tower, [name], MD  Cyanocobalamin (VITAMIN B-12) 5000 MCG TBDP Take 5,000 mcg by mouth daily.   Yes [provider]  Diabetic Sterile Lancets MISC by Does not apply route. Check glucose two times a day and as needed for out of control DM 250.00   Yes [provider]  doxepin (SINEQUAN) 50 MG capsule Take 50 mg by mouth at bedtime.  02/05/17  Yes [provider]  ferrous sulfate 325 (65 FE) MG tablet Take 325 mg by mouth daily with breakfast.   Yes [provider]  gabapentin (NEURONTIN) 300 MG capsule TAKE 1 CAPSULE TWICE A DAY 11/04/19  Yes Tower, Marne A, MD  glucose blood (ONETOUCH VERIO) test strip USE TO CHECK SUGAR TWICE A DAY 12/05/19  Yes Tower, Marne A, MD  glucose blood test strip 1 each by Other route. Check glucose two times a day and as needed for out of control DM 250.00   Yes [provider]  Lancets (ONETOUCH DELICA PLUS XQJJHE17E) MISC USE TO CHECK BLOOD SUGAR TWICE A DAY (DX. E11.9) 11/19/19  Yes Tower, [name], MD  levothyroxine (SYNTHROID) 125 MCG tablet Take 1 tablet (125 mcg total) by mouth daily. 08/15/19  Yes Tower, [name],  MD  lidocaine (XYLOCAINE) 2 % solution Use as directed 10 mLs in the mouth or throat as needed for mouth pain. 12/05/19  Yes Avegno, Darrelyn Hillock, FNP  LIFESCAN FINEPOINT LANCETS MISC Lancets and device for one touch verio flex meter. Use daily. DX 11.9 10/04/18  Yes Tower, Marne A, MD  lisinopril (ZESTRIL) 5 MG tablet TAKE ONE-HALF (1/2) TABLET DAILY 12/30/19  Yes Tower, [name], MD  metFORMIN (GLUCOPHAGE) 1000 MG tablet TAKE 1 TABLET TWICE A DAY WITH MEALS 12/30/19  Yes Tower, Marne A, MD  pantoprazole (PROTONIX) 40 MG tablet Take 1 tablet (40 mg total) by mouth daily. 09/05/19  Yes [place],  MD  sucralfate (CARAFATE) 1 g tablet Take 1 tablet (1 g total) by mouth 4 (four) times daily -  with meals and at bedtime. 06/04/19  Yes Montine Circle, PA-C  trimethoprim (TRIMPEX) 100 MG tablet Take 100 mg by mouth daily. 05/19/19  Yes [provider]  cariprazine (VRAYLAR) capsule Take 3 mg by mouth every 3 (three) days.     [provider]  fluticasone (FLONASE) 50 MCG/ACT nasal spray Place 1 spray into both nostrils daily for 14 days. 12/05/19 12/19/19  Avegno, Darrelyn Hillock, FNP  glipiZIDE (GLUCOTROL XL) 5 MG 24 hr tablet TAKE 1 TABLET DAILY 12/30/19   Tower, [name], MD  ondansetron (ZOFRAN-ODT) 8 MG disintegrating tablet Take 1 tablet (8 mg total) by mouth every 8 (eight) hours as needed for nausea or vomiting. 02/06/20   Jaynee Eagles, PA-C  cholestyramine Lucrezia Starch) 4 G packet Mix with juice or water and drink every 1-3 days. 11/18/10 05/01/11  Inda Castle, MD    Family History Family History  Problem Relation Age of Onset  . Breast cancer Mother   . Diabetes Mother   . Coronary artery disease Mother   . Kidney disease Mother        renal insufficiency  . Hyperlipidemia Mother   . Diabetes Father   . Coronary artery disease Father   . Colon cancer Father   . Colon cancer Paternal Grandmother   . Breast cancer Maternal Aunt   . Breast cancer Paternal Aunt   .  Esophageal cancer Neg Hx   . Rectal cancer Neg Hx   . Stomach cancer Neg Hx     Social History Social History   Tobacco Use  . Smoking status: Passive Smoke Exposure - Never Smoker  . Smokeless tobacco: Never Used  . Tobacco comment: husband quit smoking  Vaping Use  . Vaping Use: Never used  Substance Use Topics  . Alcohol use: Yes    Alcohol/week: 0.0 standard drinks    Comment: socially twice a month at most or less  . Drug use: No     Allergies   Keflex [cephalexin] and Mirabegron   Review of Systems Review of Systems   Physical Exam Triage Vital Signs ED Triage Vitals  Enc Vitals Group     BP 02/20/20 0852 (!) 91/54     Pulse Rate 02/20/20 0852 100     Resp 02/20/20 0852 (!) 21     Temp 02/20/20 0857 98.8 F (37.1 C)     Temp Source 02/20/20 0857 Oral     SpO2 02/20/20 0852 100 %     Weight 02/20/20 0846 200 lb (90.7 kg)     Height 02/20/20 0846 '5\' 2"'  (1.575 m)     Head Circumference --      Peak Flow --      Pain Score 02/20/20 0846 6     Pain Loc --      Pain Edu? --      Excl. in [place]? --    No data found.  Updated Vital Signs BP (!) 91/54 (BP Location: Right Arm)   Pulse 100   Temp 98.8 F (37.1 C) (Oral)   Resp (!) 21   Ht '5\' 2"'  (1.575 m)   Wt 200 lb (90.7 kg)   LMP 07/15/2006   SpO2 100%   BMI 36.58 kg/m    Physical Exam Constitutional:      General: She is not in acute distress.    Appearance: She is  well-developed.  Cardiovascular:     Rate and Rhythm: Normal rate.  Pulmonary:     Effort: Pulmonary effort is normal. No tachypnea or respiratory distress.     Breath sounds: No decreased air movement. Examination of the right-lower field reveals decreased breath sounds. Examination of the left-lower field reveals decreased breath sounds. Decreased breath sounds present.  Skin:    General: Skin is warm and dry.  Neurological:     Mental Status: She is alert and oriented to person, place, and time.      UC Treatments / Results   Labs (all labs ordered are listed, but only abnormal results are displayed) Labs Reviewed  SARS CORONAVIRUS 2 (TAT 6-24 HRS)    EKG   Radiology DG Chest 2 View  Result Date: 02/20/2020 CLINICAL DATA:  Persistent cough and fevers Sore throat EXAM: CHEST - 2 VIEW COMPARISON:  04/17/2018 FINDINGS: The heart size and mediastinal contours are within normal limits. Both lungs are clear. The visualized skeletal structures are unremarkable. IMPRESSION: No active cardiopulmonary disease. Electronically Signed   By: Miachel Roux M.D.   On: 02/20/2020 09:32    Procedures Procedures (including critical care time)  Medications Ordered in UC Medications - No data to display  Initial Impression / Assessment and Plan / UC Course  I have reviewed the triage vital signs and the nursing notes.  Pertinent labs & imaging results that were available during my care of the patient were reviewed by me and considered in my medical decision making (see chart for details).     No work of breathing, no hypoxia. Lungs are grossly clear. Chest xray reassuring here today. New illness over hte past two days or worsening/ progression of original illness which started 12/24? Antibiotics initiated today, repeat covid testing. Return precautions provided. Patient verbalized understanding and agreeable to plan. Ambulatory out of clinic without difficulty.   Final Clinical Impressions(s) / UC Diagnoses   Final diagnoses:  Lower respiratory tract infection     Discharge Instructions     Your chest xray looks well today which is reassuring.  We will recheck your covid status to ensure this is not source of symptoms.  With the length of symptoms I have opted to provide antibiotics today.  Continue with over the counter medications as well as needed for symptoms.  If symptoms worsen or do not improve in the next week to return to be seen or to follow up with your PCP.      ED Prescriptions    Medication Sig  Dispense Auth. Provider   azithromycin (ZITHROMAX) 250 MG tablet Take 2 tablets (500 mg total) by mouth daily for 1 day, THEN 1 tablet (250 mg total) daily for 4 days. 6 tablet Zigmund Gottron, NP     PDMP not reviewed this encounter.   Zigmund Gottron, NP 02/20/20 1015

## 2020-02-20 NOTE — ED Triage Notes (Signed)
PT reports Sx's started on WED with a fever,sore throat  And SHOB.

## 2020-04-09 ENCOUNTER — Other Ambulatory Visit: Payer: Self-pay

## 2020-04-09 ENCOUNTER — Ambulatory Visit: Payer: 59 | Admitting: Family Medicine

## 2020-04-09 ENCOUNTER — Encounter: Payer: Self-pay | Admitting: Family Medicine

## 2020-04-09 VITALS — BP 124/68 | HR 77 | Temp 96.9°F | Ht 62.5 in | Wt 205.2 lb

## 2020-04-09 DIAGNOSIS — E785 Hyperlipidemia, unspecified: Secondary | ICD-10-CM

## 2020-04-09 DIAGNOSIS — E119 Type 2 diabetes mellitus without complications: Secondary | ICD-10-CM | POA: Diagnosis not present

## 2020-04-09 DIAGNOSIS — R195 Other fecal abnormalities: Secondary | ICD-10-CM

## 2020-04-09 DIAGNOSIS — E1169 Type 2 diabetes mellitus with other specified complication: Secondary | ICD-10-CM

## 2020-04-09 DIAGNOSIS — N1832 Chronic kidney disease, stage 3b: Secondary | ICD-10-CM | POA: Diagnosis not present

## 2020-04-09 DIAGNOSIS — E538 Deficiency of other specified B group vitamins: Secondary | ICD-10-CM

## 2020-04-09 LAB — POCT GLYCOSYLATED HEMOGLOBIN (HGB A1C): Hemoglobin A1C: 7.1 % — AB (ref 4.0–5.6)

## 2020-04-09 MED ORDER — CYANOCOBALAMIN 1000 MCG/ML IJ SOLN: 1000.0000 ug | INTRAMUSCULAR | 3 refills | Status: AC

## 2020-04-09 MED ORDER — GABAPENTIN 300 MG PO CAPS
300.0000 mg | ORAL_CAPSULE | Freq: Two times a day (BID) | ORAL | 3 refills | Status: DC
Start: 2020-04-09 — End: 2021-07-12

## 2020-04-09 MED ORDER — LISINOPRIL 5 MG PO TABS
5.0000 mg | ORAL_TABLET | Freq: Every day | ORAL | 3 refills | Status: DC
Start: 2020-04-09 — End: 2021-06-08

## 2020-04-09 MED ORDER — LEVOTHYROXINE SODIUM 125 MCG PO TABS
125.0000 ug | ORAL_TABLET | Freq: Every day | ORAL | 3 refills | Status: DC
Start: 2020-04-09 — End: 2021-05-06

## 2020-04-09 MED ORDER — CYANOCOBALAMIN 1000 MCG/ML IJ SOLN
1000.0000 ug | Freq: Once | INTRAMUSCULAR | Status: AC
  Administered 2020-04-09: 1000 ug via INTRAMUSCULAR

## 2020-04-09 MED ORDER — METFORMIN HCL 1000 MG PO TABS
500.0000 mg | ORAL_TABLET | Freq: Two times a day (BID) | ORAL | 3 refills | Status: DC
Start: 2020-04-09 — End: 2021-07-12

## 2020-04-09 MED ORDER — ATORVASTATIN CALCIUM 10 MG PO TABS
10.0000 mg | ORAL_TABLET | Freq: Every day | ORAL | 3 refills | Status: DC
Start: 2020-04-09 — End: 2021-05-19

## 2020-04-09 MED ORDER — GLIPIZIDE ER 5 MG PO TB24
5.0000 mg | ORAL_TABLET | Freq: Every day | ORAL | 3 refills | Status: DC
Start: 2020-04-09 — End: 2021-05-11

## 2020-04-09 NOTE — Progress Notes (Signed)
Subjective:    Patient ID: Kelli Cruz, female    DOB: 1960-04-19, 60 y.o.   MRN: [id number]  This visit occurred during the SARS-CoV-2 public health emergency.  Safety protocols were in place, including screening questions prior to the visit, additional usage of staff PPE, and extensive cleaning of exam room while observing appropriate contact time as indicated for disinfecting solutions.    HPI Pt presents for f/u of chronic health problems  Also due for B12 shot   Wt Readings from Last 3 Encounters:  04/09/20 205 lb 3 oz (93.1 kg)  02/20/20 200 lb (90.7 kg)  11/17/19 198 lb 5 oz (90 kg)   36.93 kg/m   Lately she has been irritable (got in trouble with work)  She talked to her counselor about it  Sees psychiatry -has f/u in April  If this continues will reach out  Stress level is quite high   Misses work for her colon condition  ? If FMLA Missed work for fecal incontinence / also has to leave work when it happens Urgent stool, can't control  Sometime she has some blood when she wiped  Sometimes watery and sometimes loose  Episodes tend to last up 12-14 hours  On average 4-5 times per month   This started well before metformin  Had over 12 inches of colon removed with her colectomy   GI is [facility]     BP Readings from Last 3 Encounters:  04/09/20 124/68  02/20/20 (!) 91/54  02/06/20 116/73   Pulse Readings from Last 3 Encounters:  04/09/20 77  02/20/20 100  02/06/20 90   DM2 Lab Results  Component Value Date   HGBA1C 7.5 (H) 11/13/2019   Last visit we cut metformin back due to renal insuff Glipizide xl 5 mg daily  On ace   Lisinopril 5 mg daily  Statin -atorvastatin 10 mg daily   Glucose readings 90s 120s in am (depends how late she eats)  Higher later in the day -more labile  On avg under 150    Today Results for orders placed or performed in visit on 04/09/20  POCT glycosylated hemoglobin (Hb A1C)  Result Value Ref Range    Hemoglobin A1C 7.1 (A) 4.0 - 5.6 %   HbA1c POC (<> result, manual entry)     HbA1c, POC (prediabetic range)     HbA1c, POC (controlled diabetic range)       Lab Results  Component Value Date   CREATININE 1.35 (H) 11/13/2019   BUN 31 (H) 11/13/2019   NA 141 11/13/2019   K 4.7 11/13/2019   CL 106 11/13/2019   CO2 25 11/13/2019  she had visit this mo with nephrology (and lisinopril was inc)  No protein in urine   Patient Active Problem List   Diagnosis Date Noted  . Loose stools 04/10/2020  . Fatigue 09/12/2019  . Leg cramps 08/15/2019  . Plantar wart of right foot 08/15/2019  . Chronic kidney disease, stage 3b ([place]) 02/03/2019  . History of GI bleed 12/29/2018  . Anemia 02/19/2017  . Vitamin D deficiency 02/19/2017  . Knee pain, right 11/10/2016  . Proteinuria 02/18/2016  . Exposure to communicable disease 08/04/2015  . Obesity 02/04/2015  . Hypersomnia with sleep apnea 01/06/2015  . Insomnia due to mental condition   . Tingling 08/12/2014  . Burning sensation of mouth 08/12/2014  . Numbness 10/24/2011  . Routine general medical examination at a health care facility 10/24/2011  . TMJ arthralgia  08/14/2011  . Poor posture 07/26/2011  . B12 deficiency 04/13/2010  . ADENOCARCINOMA, SIGMOID COLON 08/11/2008  . Hyperlipidemia associated with type 2 diabetes mellitus ([place]) 07/08/2008  . Hypothyroidism 09/12/2006  . Diabetes type 2, controlled ([place]) 09/12/2006  . Depression with anxiety 09/12/2006  . ALLERGIC RHINITIS, SEASONAL 09/12/2006  . FIBROCYSTIC BREAST DISEASE 09/12/2006  . MIGRAINES, HX OF 09/12/2006   Past Medical History:  Diagnosis Date  . Anemia   . Anxiety   . Arthritis   . Bipolar 1 disorder ([place])   . Cancer [facility])    sigmoid colon cancer  . Depression   . Diabetes mellitus    type II  . Endometriosis   . Family history of malignant neoplasm of gastrointestinal tract   . Ganglion cyst   . GERD (gastroesophageal reflux disease)   . Hyperlipidemia    . Hypothyroidism   . Insomnia due to mental condition    Past Surgical History:  Procedure Laterality Date  . ABDOMINAL HYSTERECTOMY    . ABDOMINAL HYSTERECTOMY    . BIOPSY  12/31/2018   Procedure: BIOPSY;  Surgeon: Doran Stabler, MD;  Location: [facility] ENDOSCOPY;  Service: Gastroenterology;;  . COLON RESECTION  June 2010  . COLON SURGERY    . COLONOSCOPY    . DILATION AND CURETTAGE OF UTERUS  2006   miscarriage   . ESOPHAGOGASTRODUODENOSCOPY N/A 12/31/2018   Procedure: ESOPHAGOGASTRODUODENOSCOPY (EGD);  Surgeon: Doran Stabler, MD;  Location: [facility];  Service: Gastroenterology;  Laterality: N/A;  . GANGLION CYST EXCISION    . HERNIA REPAIR    . INTERSTIM IMPLANT PLACEMENT Left 12/11/2012   stimulator is on the left but the electrodes go to the right  . KNEE DISLOCATION SURGERY    . LAPAROSCOPY  1997   D & C for endometriosis   Social History   Tobacco Use  . Smoking status: Passive Smoke Exposure - Never Smoker  . Smokeless tobacco: Never Used  . Tobacco comment: husband quit smoking  Vaping Use  . Vaping Use: Never used  Substance Use Topics  . Alcohol use: Yes    Alcohol/week: 0.0 standard drinks    Comment: socially twice a month at most or less  . Drug use: No   Family History  Problem Relation Age of Onset  . Breast cancer Mother   . Diabetes Mother   . Coronary artery disease Mother   . Kidney disease Mother        renal insufficiency  . Hyperlipidemia Mother   . Diabetes Father   . Coronary artery disease Father   . Colon cancer Father   . Colon cancer Paternal Grandmother   . Breast cancer Maternal Aunt   . Breast cancer Paternal Aunt   . Esophageal cancer Neg Hx   . Rectal cancer Neg Hx   . Stomach cancer Neg Hx    Allergies  Allergen Reactions  . Keflex [Cephalexin] Nausea And Vomiting and Other (See Comments)    Light-headed/dizziness/weakness  . Mirabegron Other (See Comments)    Trouble breathing and swollen tongue   Current  Outpatient Medications on File Prior to Visit  Medication Sig Dispense Refill  . ALPRAZolam (XANAX) 0.5 MG tablet Take 0.5 mg by mouth at bedtime as needed for anxiety (panic attack).     Marland Kitchen amphetamine-dextroamphetamine (ADDERALL XR) 30 MG 24 hr capsule Take 30 mg by mouth daily at 6 (six) AM.     . amphetamine-dextroamphetamine (ADDERALL) 15 MG tablet Take 15 mg  by mouth daily at 2 PM.     . benzonatate (TESSALON) 100 MG capsule Take 1-2 capsules (100-200 mg total) by mouth 3 (three) times daily as needed for cough. 60 capsule 0  . Blood Glucose Monitoring Suppl (ONETOUCH VERIO FLEX SYSTEM) w/Device KIT USE TO CHECK BLOOD SUGAR TWICE A DAY (DX. E11.9) 1 kit 0  . CALCIUM PO Take 1 capsule by mouth daily.    . cariprazine (VRAYLAR) capsule Take 3 mg by mouth every 3 (three) days.     . Cholecalciferol (VITAMIN D3) 125 MCG (5000 UT) CAPS Take 5,000 Units by mouth daily.    . citalopram (CELEXA) 40 MG tablet Take 40 mg by mouth at bedtime.     . Cyanocobalamin (VITAMIN B-12) 5000 MCG TBDP Take 5,000 mcg by mouth daily.    . Diabetic Sterile Lancets MISC by Does not apply route. Check glucose two times a day and as needed for out of control DM 250.00    . doxepin (SINEQUAN) 50 MG capsule Take 50 mg by mouth at bedtime.   1  . ferrous sulfate 325 (65 FE) MG tablet Take 325 mg by mouth daily with breakfast.    . glucose blood (ONETOUCH VERIO) test strip USE TO CHECK SUGAR TWICE A DAY 200 strip 1  . glucose blood test strip 1 each by Other route. Check glucose two times a day and as needed for out of control DM 250.00    . Lancets (ONETOUCH DELICA PLUS [id number]) MISC USE TO CHECK BLOOD SUGAR TWICE A DAY (DX. E11.9) 200 each 1  . lidocaine (XYLOCAINE) 2 % solution Use as directed 10 mLs in the mouth or throat as needed for mouth pain. 100 mL 0  . LIFESCAN FINEPOINT LANCETS MISC Lancets and device for one touch verio flex meter. Use daily. DX 11.9 200 each 12  . ondansetron (ZOFRAN-ODT) 8 MG  disintegrating tablet Take 1 tablet (8 mg total) by mouth every 8 (eight) hours as needed for nausea or vomiting. 20 tablet 0  . pantoprazole (PROTONIX) 40 MG tablet Take 1 tablet (40 mg total) by mouth daily. 90 tablet 3  . sucralfate (CARAFATE) 1 g tablet Take 1 tablet (1 g total) by mouth 4 (four) times daily -  with meals and at bedtime. 120 tablet 0  . trimethoprim (TRIMPEX) 100 MG tablet Take 100 mg by mouth daily.    . fluticasone (FLONASE) 50 MCG/ACT nasal spray Place 1 spray into both nostrils daily for 14 days. 16 g 0  . [DISCONTINUED] cholestyramine (QUESTRAN) 4 G packet Mix with juice or water and drink every 1-3 days. 30 each 1   No current facility-administered medications on file prior to visit.    Review of Systems  Constitutional: Positive for fatigue. Negative for activity change, appetite change, fever and unexpected weight change.  HENT: Negative for congestion, ear pain, rhinorrhea, sinus pressure and sore throat.   Eyes: Negative for pain, redness and visual disturbance.  Respiratory: Negative for cough, shortness of breath and wheezing.   Cardiovascular: Negative for chest pain and palpitations.  Gastrointestinal: Positive for diarrhea. Negative for abdominal pain, blood in stool, constipation, nausea and vomiting.  Endocrine: Negative for polydipsia and polyuria.  Genitourinary: Negative for dysuria, frequency and urgency.  Musculoskeletal: Negative for arthralgias, back pain and myalgias.  Skin: Negative for pallor and rash.  Allergic/Immunologic: Negative for environmental allergies.  Neurological: Negative for dizziness, syncope and headaches.  Hematological: Negative for adenopathy. Does not bruise/bleed easily.  Psychiatric/Behavioral:  Negative for decreased concentration and dysphoric mood. The patient is not nervous/anxious.        Continues psychiatric care        Objective:   Physical Exam Constitutional:      General: She is not in acute distress.     Appearance: Normal appearance. She is well-developed and well-nourished. She is obese. She is not ill-appearing or diaphoretic.  HENT:     Head: Normocephalic and atraumatic.     Mouth/Throat:     Mouth: Oropharynx is clear and moist.  Eyes:     General: No scleral icterus.    Extraocular Movements: EOM normal.     Conjunctiva/sclera: Conjunctivae normal.     Pupils: Pupils are equal, round, and reactive to light.  Neck:     Thyroid: No thyromegaly.     Vascular: No carotid bruit or JVD.  Cardiovascular:     Rate and Rhythm: Normal rate and regular rhythm.     Pulses: Intact distal pulses.     Heart sounds: Normal heart sounds. No gallop.   Pulmonary:     Effort: Pulmonary effort is normal. No respiratory distress.     Breath sounds: Normal breath sounds. No wheezing or rales.     Comments: No crackles Abdominal:     General: Bowel sounds are normal. There is no distension or abdominal bruit.     Palpations: Abdomen is soft. There is no mass.     Tenderness: There is no abdominal tenderness.  Musculoskeletal:        General: No edema.     Cervical back: Normal range of motion and neck supple.     Right lower leg: No edema.     Left lower leg: No edema.  Lymphadenopathy:     Cervical: No cervical adenopathy.  Skin:    General: Skin is warm and dry.     Coloration: Skin is not pale.     Findings: No erythema or rash.  Neurological:     Mental Status: She is alert.     Coordination: Coordination normal.     Deep Tendon Reflexes: Reflexes are normal and symmetric. Reflexes normal.  Psychiatric:        Mood and Affect: Mood and affect and mood normal.           Assessment & Plan:   Problem List Items Addressed This Visit      Endocrine   Diabetes type 2, controlled ([place]) - Primary    Some improvement Lab Results  Component Value Date   HGBA1C 7.1 (A) 04/09/2020   Continues low dose metformin (monitoring renal fxn -sees nephrology)  Taking ace and statin   Eating better and no hypoglycemia with glipizide xl at 5 mg  F/u planned 6 mo  Enc her to keep working on low glycemic diet      Relevant Medications   atorvastatin (LIPITOR) 10 MG tablet   glipiZIDE (GLUCOTROL XL) 5 MG 24 hr tablet   lisinopril (ZESTRIL) 5 MG tablet   metFORMIN (GLUCOPHAGE) 1000 MG tablet   Other Relevant Orders   POCT glycosylated hemoglobin (Hb A1C) (Completed)   Hyperlipidemia associated with type 2 diabetes mellitus ([facility])    Controlled with atorvasatin 10 mg daily      Relevant Medications   atorvastatin (LIPITOR) 10 MG tablet   glipiZIDE (GLUCOTROL XL) 5 MG 24 hr tablet   lisinopril (ZESTRIL) 5 MG tablet   metFORMIN (GLUCOPHAGE) 1000 MG tablet     Genitourinary  Chronic kidney disease, stage 3b [facility])    Sees nephrology and this has been stable  Takes low dose metformin (aware) and DM control is improved (goal a1c under 7) bp stable  Encouraged good fluid intake and avoidance of nsaids        Other   B12 deficiency    B12 shot today       Loose stools    Since her colectomy for colon cancer  Per pt this pre dated metformin  Causes her to miss work 4-5 times per month (urgent stool soils clothing- wears adult undergarment) Enc her to reach out to GI re: options for tx Adv to try fiber supplement daily to bulk stool  May need FMLA for this

## 2020-04-09 NOTE — Patient Instructions (Addendum)
Call your GI doctor regarding the fecal incontinence   Also get citrucel or metamucil and take as directed once day    Take care of yourself  Keep working on gradually reducing processed carbs Try to get most of your carbohydrates from produce (with the exception of white potatoes)  Eat less bread/pasta/rice/snack foods/cereals/sweets and other items from the middle of the grocery store (processed carbs)  Stay active  Start walking before /after work   Follow up in about 6 months for annual exam

## 2020-04-10 DIAGNOSIS — R195 Other fecal abnormalities: Secondary | ICD-10-CM | POA: Insufficient documentation

## 2020-04-10 NOTE — Assessment & Plan Note (Addendum)
Since her colectomy for colon cancer  Per pt this pre dated metformin  Causes her to miss work 4-5 times per month (urgent stool soils clothing- wears adult undergarment) Enc her to reach out to GI re: options for tx Adv to try fiber supplement daily to bulk stool  May need FMLA for this

## 2020-04-10 NOTE — Assessment & Plan Note (Signed)
B12 shot today. 

## 2020-04-10 NOTE — Assessment & Plan Note (Signed)
Sees nephrology and this has been stable  Takes low dose metformin (aware) and DM control is improved (goal a1c under 7) bp stable  Encouraged good fluid intake and avoidance of nsaids

## 2020-04-10 NOTE — Assessment & Plan Note (Signed)
Controlled with atorvasatin 10 mg daily

## 2020-04-10 NOTE — Assessment & Plan Note (Signed)
Some improvement Lab Results  Component Value Date   HGBA1C 7.1 (A) 04/09/2020   Continues low dose metformin (monitoring renal fxn -sees nephrology)  Taking ace and statin  Eating better and no hypoglycemia with glipizide xl at 5 mg  F/u planned 6 mo  Enc her to keep working on low glycemic diet

## 2020-04-23 ENCOUNTER — Ambulatory Visit: Payer: 59 | Admitting: Gastroenterology

## 2020-05-05 ENCOUNTER — Telehealth: Payer: Self-pay | Admitting: Gastroenterology

## 2020-05-05 NOTE — Telephone Encounter (Signed)
Patient needs to refill Pantoprazole for 90-day supply. Pt is scheduled to see Dr. Tarri Glenn on 4/27 for meds rf. Please send rf to [place]. Pt would like a call at her work [phone] on med has been sent to pharmacy.

## 2020-05-07 MED ORDER — PANTOPRAZOLE SODIUM 40 MG PO TBEC
40.0000 mg | DELAYED_RELEASE_TABLET | Freq: Every day | ORAL | 0 refills | Status: DC
Start: 2020-05-07 — End: 2020-06-03

## 2020-05-07 NOTE — Telephone Encounter (Signed)
90day supply sent.

## 2020-05-10 ENCOUNTER — Other Ambulatory Visit: Payer: Self-pay | Admitting: Family Medicine

## 2020-05-12 ENCOUNTER — Other Ambulatory Visit: Payer: Self-pay | Admitting: Family Medicine

## 2020-05-17 ENCOUNTER — Other Ambulatory Visit: Payer: Self-pay | Admitting: Family Medicine

## 2020-06-03 ENCOUNTER — Encounter: Payer: Self-pay | Admitting: Gastroenterology

## 2020-06-03 ENCOUNTER — Ambulatory Visit (INDEPENDENT_AMBULATORY_CARE_PROVIDER_SITE_OTHER): Payer: 59 | Admitting: Gastroenterology

## 2020-06-03 VITALS — BP 122/60 | HR 56 | Ht 62.5 in | Wt 201.6 lb

## 2020-06-03 DIAGNOSIS — R748 Abnormal levels of other serum enzymes: Secondary | ICD-10-CM

## 2020-06-03 DIAGNOSIS — K760 Fatty (change of) liver, not elsewhere classified: Secondary | ICD-10-CM

## 2020-06-03 DIAGNOSIS — M6289 Other specified disorders of muscle: Secondary | ICD-10-CM | POA: Diagnosis not present

## 2020-06-03 DIAGNOSIS — K219 Gastro-esophageal reflux disease without esophagitis: Secondary | ICD-10-CM

## 2020-06-03 MED ORDER — PANTOPRAZOLE SODIUM 40 MG PO TBEC: 40.0000 mg | DELAYED_RELEASE_TABLET | Freq: Every morning | ORAL | 3 refills | Status: DC

## 2020-06-03 NOTE — Progress Notes (Signed)
Referring Provider: Abner Greenspan, MD Primary Care Physician:  Tower, [name], MD  Chief complaint: Follow-up   IMPRESSION:  Altered bowel habits with fecal soiling NSAID associated PUD with associated GI bleeding 12/2018 Suspect slight/early fatty infiltration of the liver on ultrasound 06/04/19    - normal AST/ALT    - NASH Fibrosure 07/22/19: F0(no fibrosis), S2-3(moderate steatosis), NASH score of N2 Personal history of colon cancer s/p resection 2010 Hyperplastic polyps on colonoscopy 10/09/2018  Altered bowel habits: Must consider carbohydrate intolerance, bacterial overgrowth, diet, medications, bile salt malabsorption, IBS, microscopic colitis, and SIBO given her colonoscopy findings in 2020.  Psyllium to add stool bulk Peptobismal to treat the diarrhea. Loperamide if Pepto-Bismal is not working, although avoiding this now as she will sometimes go 3 days between bowel movements. Considered anorectal manometry +/- endoscopic ultrasound +/- pelvic floor MRI. However, given the high index of suspicion, will proceed with Pelvic floor PT. Consider anorectal manometry if not responding to PT.   Symptomatic gastritis and history of PUD: Continue to avoid all NSAIDs. Trial of PPI resulted in recurrent symptoms. Resume pantoprazole 40 mg QAM. Discussed the potential long-term risks of PPI therapy.   Suspected NASH: Liver enzymes 9/21 remain normal. Given concurrent diabetes, there is increased risk for disease progression. Plan to restage her fatty liver disease next year.  Personal history of colon cancer and polyps: Colonoscopy due 2025. Would consider an earlier study if microscopic colitis increases on the differential for her symptoms.   PLAN: - Resume pantoprazole 44m QAM (3 months with 3 refills) - Continue to avoid NSAIDs - Avoid sugars and caffeine - Add Benefibe every morning, increase to twice daily after 1 week - Keep a food and symptom diary to identify causitive  factors - Keep your bottom clean and dry without excessive wiping or using astringent cleaners - Apply a barrier cream such as zinc oxide to the perianal skin - Peptobismal chews PRN - Referral to Pelvic Floor physical therapy - Colonoscopy 2025 given the personal history of colon cancer   HPI: CCYDNIE DEASONis a 60y.o. female known to me for her surveillance colonoscopy 10/09/2018 for a personal history of colon cancer s/p resection 2010 and her hospitalization for GI bleed due to NSAID-related duodenal ulcers and erosive gastropathy 12/2018.  She was seen in June 2021 after an ED visit for symptoms similar to those that she experienced at the time of her symptomatic PUD. EGD 08/01/19 showed mild gastritis with no recurrent peptic ulcer disease. Hyperemia on gastric biopsies. No H pylori.   Presents today requesting a refill of pantoprazole. With a switch in insurance, she lost coverage. When she stopped using the medication she noted recurrent symptoms.   Today she also reports chronic altered bowel habits. Since her colon cancer surgery in 2010 she has had some urgency with stooling, although this is the first time she has mentioned these symptoms to me. She is having 4-5 accidents every month, enough that she wears depends. She is having TID to every 3 day bowel movements. Frequently awakes from her sleep at 2pm to have a bowel movement.  Worsened by fruits and vegetables. Not triggered consistently by red meat, carbonation, dairy, gluten or sugar substitutes. No blood or mucous in the stool. Some difficulty with keeping the perineum clean with defecation. Supports the perineal with pressure during defecation.  Maalox provided no relief. She has not tried other changes in diet or medications.   Colonoscopy history: Personal history  of colon cancer s/p resection 2010 Prior colonoscopy with Dr. Deatra Ina 10/24/13 Most recent colonoscopy 10/09/18: 2 small hyperplastic polyps removed on colonoscopy  10/09/2018  Labs 07/22/2019 show iron 63, ferritin 33.5 Labs 11/13/2019 showed normal liver enzymes, hemoglobin 11.9, MCV 95.5, RDW 14.3, platelets 345  Past Medical History:  Diagnosis Date  . Anemia   . Anxiety   . Arthritis   . Bipolar 1 disorder ([place])   . Cancer [facility])    sigmoid colon cancer  . Depression   . Diabetes mellitus    type II  . Endometriosis   . Family history of malignant neoplasm of gastrointestinal tract   . Ganglion cyst   . GERD (gastroesophageal reflux disease)   . Hyperlipidemia   . Hypothyroidism   . Insomnia due to mental condition     Past Surgical History:  Procedure Laterality Date  . ABDOMINAL HYSTERECTOMY    . ABDOMINAL HYSTERECTOMY    . BIOPSY  12/31/2018   Procedure: BIOPSY;  Surgeon: Doran Stabler, MD;  Location: [facility] ENDOSCOPY;  Service: Gastroenterology;;  . COLON RESECTION  June 2010  . COLON SURGERY    . COLONOSCOPY    . DILATION AND CURETTAGE OF UTERUS  2006   miscarriage   . ESOPHAGOGASTRODUODENOSCOPY N/A 12/31/2018   Procedure: ESOPHAGOGASTRODUODENOSCOPY (EGD);  Surgeon: Doran Stabler, MD;  Location: [facility];  Service: Gastroenterology;  Laterality: N/A;  . GANGLION CYST EXCISION    . HERNIA REPAIR    . INTERSTIM IMPLANT PLACEMENT Left 12/11/2012   stimulator is on the left but the electrodes go to the right  . KNEE DISLOCATION SURGERY    . LAPAROSCOPY  1997   D & C for endometriosis    Current Outpatient Medications  Medication Sig Dispense Refill  . ALPRAZolam (XANAX) 0.5 MG tablet Take 0.5 mg by mouth at bedtime as needed for anxiety (panic attack).     Marland Kitchen amphetamine-dextroamphetamine (ADDERALL XR) 30 MG 24 hr capsule Take 30 mg by mouth daily at 6 (six) AM.     . amphetamine-dextroamphetamine (ADDERALL) 15 MG tablet Take 15 mg by mouth daily at 2 PM.     . atorvastatin (LIPITOR) 10 MG tablet Take 1 tablet (10 mg total) by mouth daily. 90 tablet 3  . benzonatate (TESSALON) 100 MG capsule Take 1-2 capsules  (100-200 mg total) by mouth 3 (three) times daily as needed for cough. 60 capsule 0  . Blood Glucose Monitoring Suppl (ONETOUCH VERIO FLEX SYSTEM) w/Device KIT USE TO CHECK BLOOD SUGAR TWICE A DAY (DX. E11.9) 1 kit 0  . CALCIUM PO Take 1 capsule by mouth daily.    . cariprazine (VRAYLAR) capsule Take 3 mg by mouth every 3 (three) days.     . Cholecalciferol (VITAMIN D3) 125 MCG (5000 UT) CAPS Take 5,000 Units by mouth daily.    . citalopram (CELEXA) 40 MG tablet Take 40 mg by mouth at bedtime.     . cyanocobalamin (,VITAMIN B-12,) 1000 MCG/ML injection Inject 1 mL (1,000 mcg total) into the muscle every 3 (three) months. every 8 weeks 1 mL 3  . Cyanocobalamin (VITAMIN B-12) 5000 MCG TBDP Take 5,000 mcg by mouth daily.    . Diabetic Sterile Lancets MISC by Does not apply route. Check glucose two times a day and as needed for out of control DM 250.00    . doxepin (SINEQUAN) 50 MG capsule Take 50 mg by mouth at bedtime.   1  . ferrous sulfate 325 (65  FE) MG tablet Take 325 mg by mouth daily with breakfast.    . fluticasone (FLONASE) 50 MCG/ACT nasal spray Place 1 spray into both nostrils daily for 14 days. 16 g 0  . gabapentin (NEURONTIN) 300 MG capsule Take 1 capsule (300 mg total) by mouth 2 (two) times daily. 180 capsule 3  . glipiZIDE (GLUCOTROL XL) 5 MG 24 hr tablet Take 1 tablet (5 mg total) by mouth daily. 90 tablet 3  . glucose blood test strip 1 each by Other route. Check glucose two times a day and as needed for out of control DM 250.00    . Lancets (ONETOUCH DELICA PLUS BSWHQP59F) MISC USE TO CHECK BLOOD SUGAR TWICE A DAY 200 each 3  . levothyroxine (SYNTHROID) 125 MCG tablet Take 1 tablet (125 mcg total) by mouth daily. 90 tablet 3  . lidocaine (XYLOCAINE) 2 % solution Use as directed 10 mLs in the mouth or throat as needed for mouth pain. 100 mL 0  . LIFESCAN FINEPOINT LANCETS MISC Lancets and device for one touch verio flex meter. Use daily. DX 11.9 200 each 12  . lisinopril (ZESTRIL) 5  MG tablet Take 1 tablet (5 mg total) by mouth daily. 90 tablet 3  . metFORMIN (GLUCOPHAGE) 1000 MG tablet Take 0.5 tablets (500 mg total) by mouth 2 (two) times daily with a meal. 90 tablet 3  . ondansetron (ZOFRAN-ODT) 8 MG disintegrating tablet Take 1 tablet (8 mg total) by mouth every 8 (eight) hours as needed for nausea or vomiting. 20 tablet 0  . ONETOUCH VERIO test strip USE TO CHECK SUGAR TWICE A DAY 200 strip 1  . pantoprazole (PROTONIX) 40 MG tablet Take 1 tablet (40 mg total) by mouth daily. 90 tablet 0  . sucralfate (CARAFATE) 1 g tablet Take 1 tablet (1 g total) by mouth 4 (four) times daily -  with meals and at bedtime. 120 tablet 0  . trimethoprim (TRIMPEX) 100 MG tablet Take 100 mg by mouth daily.     No current facility-administered medications for this visit.    Allergies as of 06/03/2020 - Review Complete 04/09/2020  Allergen Reaction Noted  . Keflex [cephalexin] Nausea And Vomiting and Other (See Comments) 12/29/2018  . Mirabegron Other (See Comments) 09/18/2018    Family History  Problem Relation Age of Onset  . Breast cancer Mother   . Diabetes Mother   . Coronary artery disease Mother   . Kidney disease Mother        renal insufficiency  . Hyperlipidemia Mother   . Diabetes Father   . Coronary artery disease Father   . Colon cancer Father   . Colon cancer Paternal Grandmother   . Breast cancer Maternal Aunt   . Breast cancer Paternal Aunt   . Esophageal cancer Neg Hx   . Rectal cancer Neg Hx   . Stomach cancer Neg Hx       Physical Exam: General:   Alert,  well-nourished, pleasant and cooperative in NAD Head:  Normocephalic and atraumatic. Eyes:  Sclera clear, no icterus.   Conjunctiva pink. Abdomen:  Soft, nontender, nondistended, normal bowel sounds, no rebound or guarding. No hepatosplenomegaly.   Rectal: No chemical dermatitis. Brown liquid stool present at the anus.  No fistula. No prolapsing hemorrhoids. No rectal prolapse. Normal anocutaneous  reflex. No stool in the rectal vault. No mass or fecal impaction. Low anal resting tone and poor squeeze pressure.  Chaperone: Sophia  Neurologic:  Alert and  oriented x4;  grossly  nonfocal Skin:  Intact without significant lesions or rashes. Psych:  Alert and cooperative. Normal mood and affect.  Coron Rossano L. Tarri Glenn, MD, MPH 06/03/2020, 3:32 PM

## 2020-06-03 NOTE — Patient Instructions (Addendum)
It was a pleasure to see you today. Based on our discussion, I am providing you with my recommendations below:  RECOMMENDATION(S):    I will send in a refill today to your pharmacy for Pantoprazole   Continue to avoid NSAIDs   We will plan to repeat your colonoscopy in 2025   I will refer you to Earlie Counts, PT for pelvic floor dysfunction. You will receive a call from their office regarding the date, time and location for your appointment.   Please add a daily dose of Benefiber, Metamucil or Citrucel, then increase to 2 times daily after 1 week   Please consider Peptobismal chewables as needed   Avoid sugars and caffeine   Keep a food and symptom diary to identify causitive factors.   PRESCRIPTION MEDICATION(S):   We have sent the following medication(s) to your pharmacy:  . Pantoprazole - please take 40mg  by mouth every morning  NOTE: If your medication(s) requires a PRIOR AUTHORIZATION, we will receive notification from your pharmacy. Once received, the process to submit for approval may take up to 7-10 business days. You will be contacted about any denials we have received from your insurance company as well as alternatives recommended by your provider.  OVER THE COUNTER MEDICATION(S):   Please purchase the following medications over the counter and take as directed:  Marland Kitchen Benefiber, Metamucil or Citrucel - take 1 dose every morning, then increase to 2 times daily after 1 week . Peptobismal chewables - please take as needed  FOLLOW UP:  . I would like for you to follow up with me in as needed. Please call the office at [phone] to schedule your appointment.  BMI:  . If you are age 65 or younger, your body mass index should be between 19-25. Your Body mass index is 36.29 kg/m. If this is out of the aformentioned range listed, please consider follow up with your Primary Care Provider.   Thank you for trusting me with your gastrointestinal care!    Thornton Park, MD, MPH

## 2020-07-08 ENCOUNTER — Telehealth: Payer: Self-pay

## 2020-07-08 ENCOUNTER — Ambulatory Visit: Payer: 59

## 2020-07-08 NOTE — Telephone Encounter (Signed)
[place] Night - Client Nonclinical Telephone Record [facility] Client [place] Night - Client Client Site [place] Physician Tower, Roque Lias - MD Contact Type Call Who Is Calling Patient / Member / Family / Caregiver Caller Name [place] Phone Number [phone] Patient Name Kelli Cruz Patient DOB 09-16-1960 Call Type Message Only Information Provided Reason for Call Request to [place] Appointment Initial Comment Caller states she is scheduled for a b-12 shot tomorrow and needs to reschedule. Patient request to speak to RN No Disp. Time Disposition Final User 07/07/2020 5:06:50 PM General Information Provided Yes Paulita Cradle Call Closed By: Paulita Cradle Transaction Date/Time: 07/07/2020 5:05:05 PM (ET)

## 2020-07-22 ENCOUNTER — Other Ambulatory Visit: Payer: Self-pay

## 2020-07-22 ENCOUNTER — Ambulatory Visit (INDEPENDENT_AMBULATORY_CARE_PROVIDER_SITE_OTHER): Payer: 59

## 2020-07-22 DIAGNOSIS — E538 Deficiency of other specified B group vitamins: Secondary | ICD-10-CM

## 2020-07-22 MED ORDER — CYANOCOBALAMIN 1000 MCG/ML IJ SOLN
1000.0000 ug | Freq: Once | INTRAMUSCULAR | Status: AC
  Administered 2020-07-22: 1000 ug via INTRAMUSCULAR

## 2020-07-22 NOTE — Progress Notes (Signed)
Per orders of Dr. Sharlene Motts, injection of vit I71 given by Brenton Grills. Patient tolerated injection well.

## 2020-07-29 ENCOUNTER — Encounter: Payer: Self-pay | Admitting: Physical Therapy

## 2020-07-29 ENCOUNTER — Other Ambulatory Visit: Payer: Self-pay

## 2020-07-29 ENCOUNTER — Ambulatory Visit: Payer: 59 | Attending: Gastroenterology | Admitting: Physical Therapy

## 2020-07-29 DIAGNOSIS — R159 Full incontinence of feces: Secondary | ICD-10-CM | POA: Insufficient documentation

## 2020-07-29 DIAGNOSIS — R278 Other lack of coordination: Secondary | ICD-10-CM

## 2020-07-29 DIAGNOSIS — M6281 Muscle weakness (generalized): Secondary | ICD-10-CM | POA: Insufficient documentation

## 2020-07-29 NOTE — Therapy (Signed)
[facility] [address] [place], [place], [address] Phone: [phone]   Fax:  [phone]  Physical Therapy Evaluation  Patient Details  Name: Kelli Cruz MRN: [id number] Date of Birth: Feb 22, 1960 Referring Provider (PT): Dr. Thornton Park   Encounter Date: 07/29/2020   PT End of Session - 07/29/20 1650     Visit Number 1    Date for PT Re-Evaluation 11/28/20    Authorization Type cigna    PT Start Time 1615    PT Stop Time 1700    PT Time Calculation (min) 45 min    Activity Tolerance Patient tolerated treatment well;Patient limited by pain    Behavior During Therapy [facility] for tasks assessed/performed             Past Medical History:  Diagnosis Date   Anemia    Anxiety    Arthritis    Bipolar 1 disorder ([place])    Cancer ([place])    sigmoid colon cancer   Depression    Diabetes mellitus    type II   Endometriosis    Family history of malignant neoplasm of gastrointestinal tract    Ganglion cyst    GERD (gastroesophageal reflux disease)    Hyperlipidemia    Hypothyroidism    Insomnia due to mental condition     Past Surgical History:  Procedure Laterality Date   ABDOMINAL HYSTERECTOMY     ABDOMINAL HYSTERECTOMY     BIOPSY  12/31/2018   Procedure: BIOPSY;  Surgeon: Doran Stabler, MD;  Location: [facility] ENDOSCOPY;  Service: Gastroenterology;;   COLON RESECTION  June 2010   [place] AND CURETTAGE OF UTERUS  2006   miscarriage    ESOPHAGOGASTRODUODENOSCOPY N/A 12/31/2018   Procedure: ESOPHAGOGASTRODUODENOSCOPY (EGD);  Surgeon: Doran Stabler, MD;  Location: [place];  Service: Gastroenterology;  Laterality: N/A;   GANGLION CYST EXCISION     HERNIA REPAIR     INTERSTIM IMPLANT PLACEMENT Left 12/11/2012   stimulator is on the left but the electrodes go to the right   [place]   D & C for endometriosis     There were no vitals filed for this visit.    Subjective Assessment - 07/29/20 1619     Subjective When has the urge will poop in her pants. Had to leave work. She never knows when the urge to have a BM will hit. Initially will be hard to start the bowel movement but afterwards she is not able to let it come out. Does not feel like she has fully empty her bowels. She will then lay down until the urge happens and will not make it to the commode. She will tense her body to stop the bowel movement but unable to. goes throug 1-5 pads per day. She wears depends all the time.    Patient Stated Goals be able to get to the bathroom before she poops in her pants    Currently in Pain? No/denies    Multiple Pain Sites No                [facility] PT Assessment - 07/29/20 0001       Assessment   Medical Diagnosis M62.89 Pelvic floor Dysfunction    Referring Provider (PT) Dr. Thornton Park      Precautions   Precautions Other (comment)    Precaution  Comments sigmoid cancer, Interstim implant      Restrictions   Weight Bearing Restrictions No      Balance Screen   Has the patient fallen in the past 6 months No    Has the patient had a decrease in activity level because of a fear of falling?  No    Is the patient reluctant to leave their home because of a fear of falling?  No      Prior Function   Level of Independence Independent    Vocation Full time employment    Vocation Requirements walking, sitting    Leisure no due to long work hours      Cognition   Overall Cognitive Status Within Functional Limits for tasks assessed      Posture/Postural Control   Posture/Postural Control No significant limitations      ROM / Strength   AROM / PROM / Strength AROM;PROM;Strength      AROM   Right Hip Extension -3    Left Hip Extension 0      PROM   Right Hip Flexion 100    Right Hip External Rotation  35    Left Hip Flexion 100    Left Hip External Rotation  55    Lumbar  Extension decreased by 25%    Lumbar - Right Side Bend decreased by 25%    Lumbar - Left Side Bend decreased by 25%    Lumbar - Right Rotation decreased by 25%      Strength   Right Hip Extension 3-/5    Right Hip ABduction 4/5    Left Hip Extension 3-/5      Lumbar    Lumbar - Left Rotation decreased by 25%      Palpation   SI assessment  left ilum rotated posteriorly      Special Tests    Special Tests Sacrolliac Tests      Pelvic Compression   Findings Positive    Side Left    comment pain                        Objective measurements completed on examination: See above findings.     Pelvic Floor Special Questions - 07/29/20 0001     Prior Pregnancies Yes    Number of Pregnancies --   1 miscarriage   Currently Sexually Active Yes    Is this Painful Yes   always had pain   Urinary Leakage Yes    Pad use depends all day, wipes with toilet paper    Activities that cause leaking With strong urge    Fecal incontinence Yes    External Perineal Exam labia are dry, vulvar area is red, along the crease clitoral hood and labia is Kelli Cruz    Skin Integrity Erthema;Irritaion present at    Perineal Body/Introitus  Other    Perineal Body/Introitus other restricte    Pelvic Floor Internal Exam Patient confirms identfication and approves PT to assess pelvic floor and treatment    Exam Type Vaginal;Rectal    Palpation tenderness located on the urethra sphincter, levator ani, obturator internist, perineal body, internal and external sphincter, puborectals; when patient bears down her rectum will contract. Unable to place therapsit index finger into the rectum past the puborectalis.    Strength Flicker   rectally and vaginally             [facility] Adult PT Treatment/Exercise - 07/29/20 0001  Self-Care   Self-Care Other Self-Care Comments    Other Self-Care Comments  educated patient on vaginal health, using moisturizer cream to reduce the dryness and redness                     PT Education - 07/29/20 1717     Education Details educated patient in vaginal moisturizers and how to care for the vaginal area and rectal area    Person(s) Educated Patient    Methods Explanation;Handout    Comprehension Verbalized understanding              PT Short Term Goals - 07/29/20 1725       PT SHORT TERM GOAL #1   Title independent with initial HEP    Time 4    Period Weeks    Status New    Target Date 08/26/20      PT SHORT TERM GOAL #2   Title able to bulge the rectum instead of contract when she has a bowel movement    Time 4    Period Weeks    Status New    Target Date 08/26/20      PT SHORT TERM GOAL #3   Title understand how to care for the rectal and vaginal tissue to reduce the redness    Time 4    Period Weeks    Status New    Target Date 08/26/20               PT Long Term Goals - 07/29/20 1737       PT LONG TERM GOAL #1   Title independent with advanced HEP for pelvic floor coordination and strength    Time 4    Period Months    Status New    Target Date 11/28/20      PT LONG TERM GOAL #2   Title able to contract the vaginal with strength >/3/5 to reduce her urinary leakage >/= 50%    Time 4    Period Months    Status New    Target Date 11/28/20      PT LONG TERM GOAL #3   Title able to contract the anal sphincter with strength >/= 3/5 to stop fecal leakage as she is walking to the bathroom    Time 4    Period Months    Status New    Target Date 11/28/20      PT LONG TERM GOAL #4   Title reduction of pelvic floor muscles trigger points so she is able to have a vaginal exam and the pelvic floor mucsles can contract correctly    Time 4    Period Months    Status New    Target Date 11/28/20                    Plan - 07/29/20 1721     Clinical Impression Statement Patient is a 60 year old female with pelvic floor dysfunction since her Sigmoid colon cancer, 2010. Patient reports  she will have fecal leakage several times per day and wear a depends day and night. When she has the urge and the stool starts to come out she is unable to stop it. Patient reports the has some difficulty with then initial bowel movement and may have to splint externally. She has had to leave work early and not go to functions due to the stool leakage. Patient also reports she has urinary leakage  even after the interstim was placed on the left back area. Pelvic floor strength is 1/5 for rectal and vaginally. When patient is asked to bear down rectally she will contract the anal sphincter. She has tenderness located the vulvar area, bilateral levator ani and obturator internist vaginally. The therapist was not able to place her index finger more than 1.5 inches due to pain. She has dryness along the labia, redness on the vulvar area and Kelli Cruz along the hood of the clitorus and labia. Her labia minora on the left is fused to the labia majora. Patient will benefit from skilled therapy to improve pelvic floor coordination and tissue mobility, reduce pelvic pain and reduce her leakage.    Personal Factors and Comorbidities Comorbidity 3+;Sex;Past/Current Experience;Fitness;Age    Comorbidities s/p Sigmoid cancer with colon resection; Abdomial hysterectomy; Endometriosis; Hernia repair; Interstim implant placement; Hypothyroidism; Bipolar;    Examination-Activity Limitations Continence;Toileting;Stairs;Stand;Hygiene/Grooming;Lift;Squat;Bend;Locomotion Level;Transfers    Examination-Participation Restrictions Interpersonal Relationship;Community Activity;Occupation    Stability/Clinical Decision Making Evolving/Moderate complexity    Clinical Decision Making Moderate    Rehab Potential Good    PT Frequency 1x / week    PT Duration Other (comment)   4 months   PT Treatment/Interventions ADLs/Self Care Home Management;Biofeedback;Electrical Stimulation;Cryotherapy;Moist Heat;Neuromuscular  re-education;Therapeutic exercise;Therapeutic activities;Functional mobility training;Patient/family education;Manual techniques;Dry needling;Spinal Manipulations;Joint Manipulations    PT Next Visit Plan diaphragmatic breathing, hip stretches and pelvic floor stretches; roller for the thigh and gluteal for HEP, sitting on ball to massage the perineum    Consulted and Agree with Plan of Care Patient             Patient will benefit from skilled therapeutic intervention in order to improve the following deficits and impairments:  Decreased coordination, Decreased range of motion, Increased fascial restricitons, Decreased endurance, Increased muscle spasms, Decreased activity tolerance, Decreased skin integrity, Pain, Decreased mobility, Decreased strength  Visit Diagnosis: Muscle weakness (generalized) - Plan: PT plan of care cert/re-cert  Other lack of coordination - Plan: PT plan of care cert/re-cert  Incontinence of feces, unspecified fecal incontinence type - Plan: PT plan of care cert/re-cert     Problem List Patient Active Problem List   Diagnosis Date Noted   Loose stools 04/10/2020   Fatigue 09/12/2019   Leg cramps 08/15/2019   Plantar wart of right foot 08/15/2019   Chronic kidney disease, stage 3b ([place]) 02/03/2019   History of GI bleed 12/29/2018   Anemia 02/19/2017   Vitamin D deficiency 02/19/2017   Knee pain, right 11/10/2016   Proteinuria 02/18/2016   Exposure to communicable disease 08/04/2015   Obesity 02/04/2015   Hypersomnia with sleep apnea 01/06/2015   Insomnia due to mental condition    Tingling 08/12/2014   Burning sensation of mouth 08/12/2014   Numbness 10/24/2011   Routine general medical examination at a health care facility 10/24/2011   TMJ arthralgia 08/14/2011   Poor posture 07/26/2011   B12 deficiency 04/13/2010   ADENOCARCINOMA, SIGMOID COLON 08/11/2008   Hyperlipidemia associated with type 2 diabetes mellitus ([place]) 07/08/2008    Hypothyroidism 09/12/2006   Diabetes type 2, controlled ([place]) 09/12/2006   Depression with anxiety 09/12/2006   ALLERGIC RHINITIS, SEASONAL 09/12/2006   FIBROCYSTIC BREAST DISEASE 09/12/2006   MIGRAINES, HX OF 09/12/2006    Kelli Cruz, PT 07/29/20 5:43 PM  [facility] [address] [place], [place], [address] Phone: [phone]   Fax:  [phone]  Name: Kelli Cruz MRN: [id number] Date of Birth: [birth year]

## 2020-07-29 NOTE — Patient Instructions (Signed)
Moisturizers They are used in the vagina to hydrate the mucous membrane that make up the vaginal canal. Designed to keep a more normal acid balance (ph) Once placed in the vagina, it will last between two to three days.  Use 2 times per week at bedtime  Ingredients to avoid is glycerin and fragrance, can increase chance of infection Should not be used just before sex due to causing irritation Most are gels administered either in a tampon-shaped applicator or as a vaginal suppository. They are non-hormonal.   Types of Moisturizers(internal use)  Vitamin E vaginal suppositories- Whole foods, Amazon Moist Again Coconut oil- can break down condoms Julva- (Do no use if on Tamoxifen) amazon Yes moisturizer- amazon NeuEve Silk , NeuEve Silver for menopausal or over 65 (if have severe vaginal atrophy or cancer treatments use NeuEve Silk for  1 month than move to [employer])- [employer], [place].com Olive and Bee intimate cream- www.oliveandbee.com.au Mae vaginal moisturizer- Amazon Aloe    Creams to use externally on the Vulva area Albertson's (good for for cancer patients that had radiation to the area)- [place] or [employer].FlyingBasics.com.br V-magic cream - amazon Julva-amazon Vital "V [place] salve ( help moisturize and help with thinning vulvar area, does have Dyer by Irwin Brakeman labial moisturizer (Amazon,  Coconut or olive oil aloe   Things to avoid in the vaginal area Do not use things to irritate the vulvar area No lotions just specialized creams for the vulva area- Neogyn, V-magic, No soaps; can use Aveeno or Calendula cleanser if needed. Must be gentle No deodorants No douches Good to sleep without underwear to let the vaginal area to air out No scrubbing: spread the lips to let warm water rinse over labias and pat dry [facility] [address] [place], [place]  [address] Phone # [phone] Fax [phone]

## 2020-08-26 ENCOUNTER — Ambulatory Visit: Payer: 59 | Attending: Gastroenterology | Admitting: Physical Therapy

## 2020-08-26 ENCOUNTER — Encounter: Payer: Self-pay | Admitting: Physical Therapy

## 2020-08-26 ENCOUNTER — Other Ambulatory Visit: Payer: Self-pay

## 2020-08-26 DIAGNOSIS — R278 Other lack of coordination: Secondary | ICD-10-CM | POA: Diagnosis present

## 2020-08-26 DIAGNOSIS — M6281 Muscle weakness (generalized): Secondary | ICD-10-CM | POA: Diagnosis present

## 2020-08-26 DIAGNOSIS — R159 Full incontinence of feces: Secondary | ICD-10-CM | POA: Diagnosis present

## 2020-08-26 NOTE — Therapy (Signed)
[facility] [address] [place], [place], [address] Phone: [phone]   Fax:  [phone]  Physical Therapy Treatment  Patient Details  Name: Kelli Cruz MRN: [id number] Date of Birth: 1960/09/15 Referring Provider (PT): Dr. Thornton Park   Encounter Date: 08/26/2020   PT End of Session - 08/26/20 1658     Visit Number 2    Date for PT Re-Evaluation 11/28/20    Authorization Type cigna    PT Start Time 1600    PT Stop Time 1645    PT Time Calculation (min) 45 min    Activity Tolerance Patient tolerated treatment well;No increased pain    Behavior During Therapy WFL for tasks assessed/performed             Past Medical History:  Diagnosis Date   Anemia    Anxiety    Arthritis    Bipolar 1 disorder ([place])    Cancer ([place])    sigmoid colon cancer   Depression    Diabetes mellitus    type II   Endometriosis    Family history of malignant neoplasm of gastrointestinal tract    Ganglion cyst    GERD (gastroesophageal reflux disease)    Hyperlipidemia    Hypothyroidism    Insomnia due to mental condition     Past Surgical History:  Procedure Laterality Date   ABDOMINAL HYSTERECTOMY     ABDOMINAL HYSTERECTOMY     BIOPSY  12/31/2018   Procedure: BIOPSY;  Surgeon: Doran Stabler, MD;  Location: [facility] ENDOSCOPY;  Service: Gastroenterology;;   COLON RESECTION  June 2010   [place] AND CURETTAGE OF UTERUS  2006   miscarriage    ESOPHAGOGASTRODUODENOSCOPY N/A 12/31/2018   Procedure: ESOPHAGOGASTRODUODENOSCOPY (EGD);  Surgeon: Doran Stabler, MD;  Location: [place];  Service: Gastroenterology;  Laterality: N/A;   GANGLION CYST EXCISION     HERNIA REPAIR     INTERSTIM IMPLANT PLACEMENT Left 12/11/2012   stimulator is on the left but the electrodes go to the right   [place]   D & C for endometriosis    There  were no vitals filed for this visit.   Subjective Assessment - 08/26/20 1559     Subjective I have been airing out the area. I still need to buy some products. I have had several times of pooping in the pants.    Patient Stated Goals be able to get to the bathroom before she poops in her pants    Currently in Pain? No/denies    Multiple Pain Sites No                               [facility] Adult PT Treatment/Exercise - 08/26/20 0001       Self-Care   Self-Care Other Self-Care Comments    Other Self-Care Comments  sit on a ball to massage the perineum; gave patient desert harvest reveleum for the skin around the vulvar and anal area      Neuro Re-ed    Neuro Re-ed Details  diaphragmatic breathing in supine and sidely to improve rib cage expansion and relax the pelvic floor, therapist was assisting the rib cage with the movement      Lumbar Exercises: Stretches   Active Hamstring Stretch Right;Left;1 rep;30 seconds  Active Hamstring Stretch Limitations supine with strap    Lower Trunk Rotation 2 reps;30 seconds   right, left   Lower Trunk Rotation Limitations supine with leg over trunk    ITB Stretch Right;Left;1 rep;30 seconds    ITB Stretch Limitations supine with strap    Other Lumbar Stretch Exercise happy baby hold for 30 sec      Lumbar Exercises: Supine   Ab Set 10 reps    AB Set Limitations with breath and tactile cues to engage the lower abdomen and used a ball      Manual Therapy   Manual Therapy Soft tissue mobilization;Joint mobilization    Joint Mobilization rib cage mobilization to the lower ribs to improve the bucket handle motion; PA mobilization to T8-T10 to improve extension for breathing    Soft tissue mobilization manual movilization to thoracic lumbar paraspinals, manual work around the ilium, manual work to the obliques and moving hands from midline to the sides of the abdomen in prone; sidley working on the obliques  and diaphragm to improve  tissue mobility; scar massage to the lower abdomen and along the midline                    PT Education - 08/26/20 1657     Education Details Access Code: [email or web address]; education on scar massage; gave patient desert harvest reveleum for the skin irritation on the perineum and anal area    Person(s) Educated Patient    Methods Demonstration;Explanation;Verbal cues;Handout    Comprehension Returned demonstration;Verbalized understanding              PT Short Term Goals - 07/29/20 1725       PT SHORT TERM GOAL #1   Title independent with initial HEP    Time 4    Period Weeks    Status New    Target Date 08/26/20      PT SHORT TERM GOAL #2   Title able to bulge the rectum instead of contract when she has a bowel movement    Time 4    Period Weeks    Status New    Target Date 08/26/20      PT SHORT TERM GOAL #3   Title understand how to care for the rectal and vaginal tissue to reduce the redness    Time 4    Period Weeks    Status New    Target Date 08/26/20               PT Long Term Goals - 07/29/20 1737       PT LONG TERM GOAL #1   Title independent with advanced HEP for pelvic floor coordination and strength    Time 4    Period Months    Status New    Target Date 11/28/20      PT LONG TERM GOAL #2   Title able to contract the vaginal with strength >/3/5 to reduce her urinary leakage >/= 50%    Time 4    Period Months    Status New    Target Date 11/28/20      PT LONG TERM GOAL #3   Title able to contract the anal sphincter with strength >/= 3/5 to stop fecal leakage as she is walking to the bathroom    Time 4    Period Months    Status New    Target Date 11/28/20      PT LONG TERM GOAL #  4   Title reduction of pelvic floor muscles trigger points so she is able to have a vaginal exam and the pelvic floor mucsles can contract correctly    Time 4    Period Months    Status New    Target Date 11/28/20                   Plan -  08/26/20 1603     Clinical Impression Statement Patient still has some fecal leakage. Patient has improved movement of the lower rib cage for breathing. She has trouble expanding her abdomen and relaxing her pelvic floor with breath. Patient is able to engage her lower abdominals with tactile cues. She has improve scar mobility after manual work and understands how to perform scar massage. Patient has difficulty with intercourse due to the penis not able to fully enter the vaginal canal which could correlate with the therapist not able to place her index finger into the anal canal. Patient will benefi tfrom skilled therapy to improve pelvic floor cooridnaiton and tissue mobitilty, reduce pelvic pain and reduce leakage.    Personal Factors and Comorbidities Comorbidity 3+;Sex;Past/Current Experience;Fitness;Age    Comorbidities s/p Sigmoid cancer with colon resection; Abdomial hysterectomy; Endometriosis; Hernia repair; Interstim implant placement; Hypothyroidism; Bipolar;    Examination-Activity Limitations Continence;Toileting;Stairs;Stand;Hygiene/Grooming;Lift;Squat;Bend;Locomotion Level;Transfers    Examination-Participation Restrictions Interpersonal Relationship;Community Activity;Occupation    Stability/Clinical Decision Making Evolving/Moderate complexity    Rehab Potential Good    PT Duration Other (comment)   4 months   PT Treatment/Interventions ADLs/Self Care Home Management;Biofeedback;Electrical Stimulation;Cryotherapy;Moist Heat;Neuromuscular re-education;Therapeutic exercise;Therapeutic activities;Functional mobility training;Patient/family education;Manual techniques;Dry needling;Spinal Manipulations;Joint Manipulations    PT Next Visit Plan see if the desert harvest reveleum helped; manual work internally vaginally, work on abdominal engagement    PT Home Exercise Plan Access Code: [email or web address]    Recommended Other Services MD signed initial eval    Consulted and Agree with Plan of Care  Patient             Patient will benefit from skilled therapeutic intervention in order to improve the following deficits and impairments:  Decreased coordination, Decreased range of motion, Increased fascial restricitons, Decreased endurance, Increased muscle spasms, Decreased activity tolerance, Decreased skin integrity, Pain, Decreased mobility, Decreased strength  Visit Diagnosis: Muscle weakness (generalized)  Other lack of coordination  Incontinence of feces, unspecified fecal incontinence type     Problem List Patient Active Problem List   Diagnosis Date Noted   Loose stools 04/10/2020   Fatigue 09/12/2019   Leg cramps 08/15/2019   Plantar wart of right foot 08/15/2019   Chronic kidney disease, stage 3b ([place]) 02/03/2019   History of GI bleed 12/29/2018   Anemia 02/19/2017   Vitamin D deficiency 02/19/2017   Knee pain, right 11/10/2016   Proteinuria 02/18/2016   Exposure to communicable disease 08/04/2015   Obesity 02/04/2015   Hypersomnia with sleep apnea 01/06/2015   Insomnia due to mental condition    Tingling 08/12/2014   Burning sensation of mouth 08/12/2014   Numbness 10/24/2011   Routine general medical examination at a health care facility 10/24/2011   TMJ arthralgia 08/14/2011   Poor posture 07/26/2011   B12 deficiency 04/13/2010   ADENOCARCINOMA, SIGMOID COLON 08/11/2008   Hyperlipidemia associated with type 2 diabetes mellitus ([place]) 07/08/2008   Hypothyroidism 09/12/2006   Diabetes type 2, controlled ([place]) 09/12/2006   Depression with anxiety 09/12/2006   ALLERGIC RHINITIS, SEASONAL 09/12/2006   FIBROCYSTIC BREAST DISEASE 09/12/2006   MIGRAINES, HX OF 09/12/2006  Earlie Counts, PT 08/26/20 5:03 PM  [facility] [address], [place], [place], [address] Phone: [phone]   Fax:  [phone]  Name: Kelli Cruz MRN: [id number] Date of Birth: 07-11-1960

## 2020-08-26 NOTE — Patient Instructions (Signed)
Access Code: [email or web address] URL: [email or web address] Date: 08/26/2020 Prepared by: Earlie Counts  Exercises Supine Hamstring Stretch with Strap - 1 x daily - 7 x weekly - 1 sets - 2 reps - 30 sec hold Supine ITB Stretch with Strap - 1 x daily - 7 x weekly - 1 sets - 2 reps - 30 sec hold Supine Figure 4 Piriformis Stretch - 1 x daily - 7 x weekly - 1 sets - 2 reps - 30 sec hold Supine Pelvic Floor Stretch - 1 x daily - 7 x weekly - 1 sets - 1 reps - 30 sec hold Supine Piriformis Stretch with Leg Straight - 1 x daily - 7 x weekly - 1 sets - 2 reps - 30 sec hold Supine Diaphragmatic Breathing - 2 x daily - 7 x weekly - 1 sets - 10 reps Hooklying Transversus Abdominis Palpation - 2 x daily - 7 x weekly - 1 sets - 10 reps [facility] [address], [place], [place] [address] Phone # [phone] Fax [phone]

## 2020-09-12 IMAGING — CT CT ABD-PELV W/ CM
2 of 5 series · 13 of 46 positions shown, 15 images · IV contrast (Omni 300)
Comparison: CT Abdomen and Pelvis 12/26/2018.
COMPARISON: CT Abdomen and Pelvis 12/26/2018.

Addendum:
CLINICAL DATA: 58-year-old female with right mid abdomen pain
radiating to the flank. History of treated colon cancer in 0323. New
anemia.

EXAM:
CT ABDOMEN AND PELVIS WITH CONTRAST
TECHNIQUE: Multidetector CT imaging of the abdomen and pelvis was performed
using the standard protocol following bolus administration of
intravenous contrast.
CONTRAST:  100mL OMNIPAQUE IOHEXOL 300 MG/ML  SOLN

[Series 3: a/p w/ 5mm · axial · 0.98mm/px · z∈[-876,-441]mm · 10 of 99 slices shown, 12 images]
[im 6/99  soft-tissue]
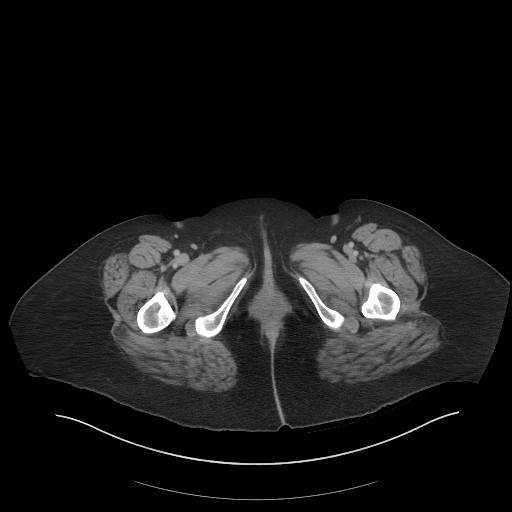
[im 6/99  bone]
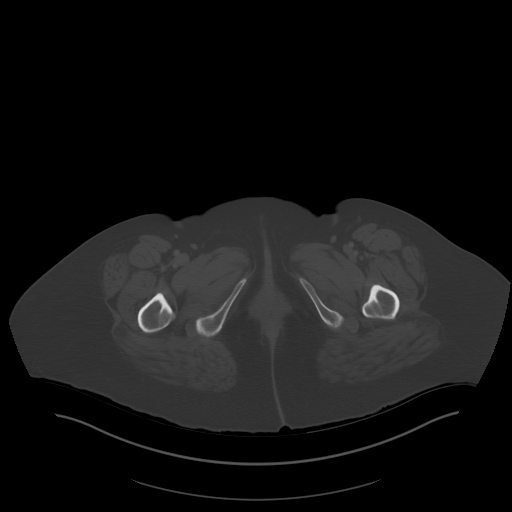
[im 16/99  soft-tissue]
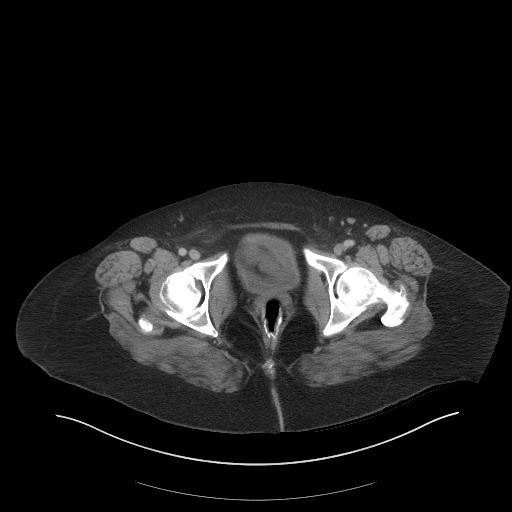
[im 26/99  soft-tissue]
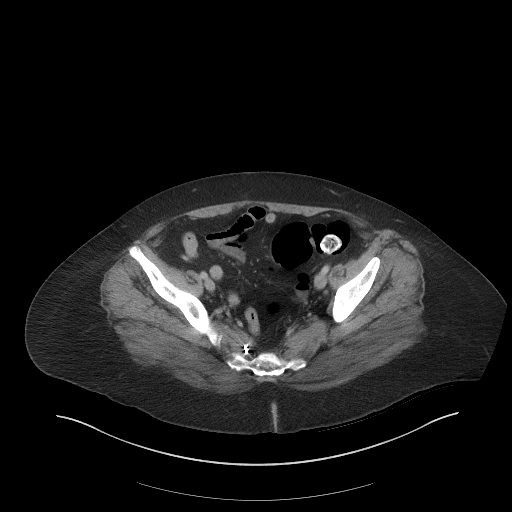
[im 37/99  soft-tissue]
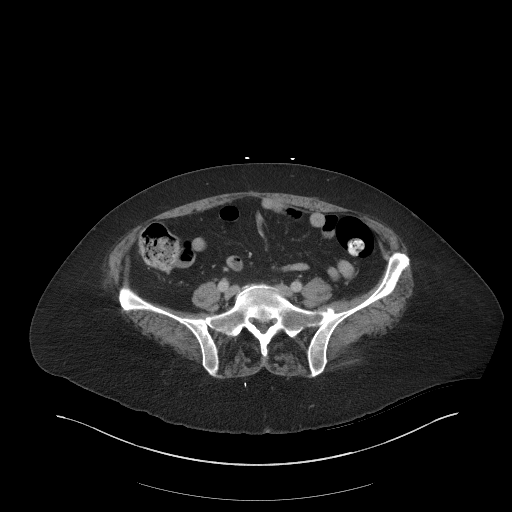
[im 47/99  soft-tissue]
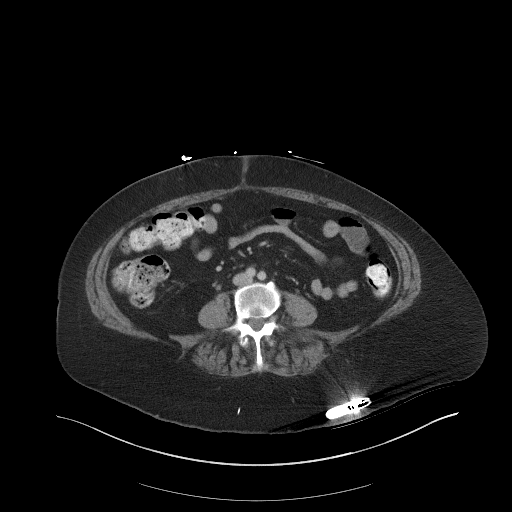
[im 52/99  soft-tissue]
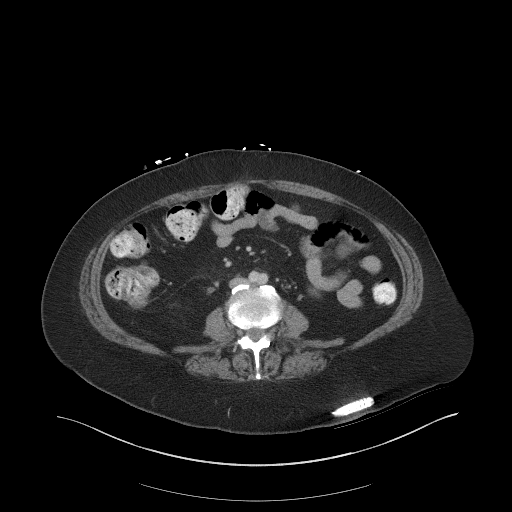
[im 62/99  soft-tissue]
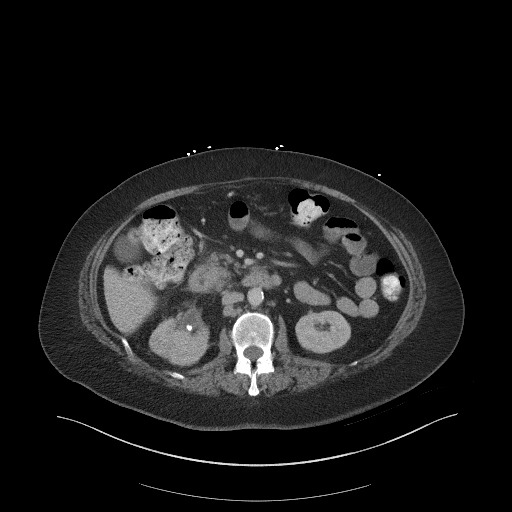
[im 73/99  soft-tissue]
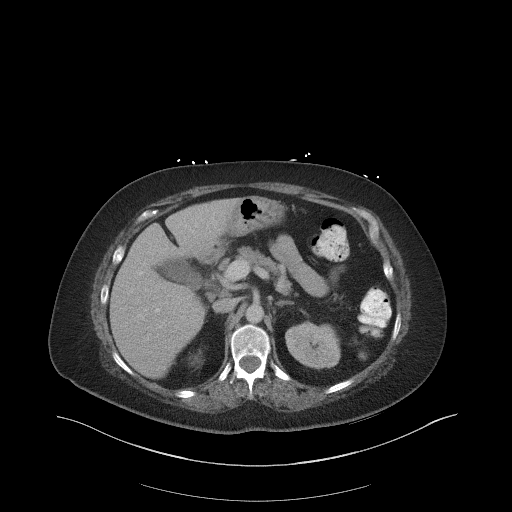
[im 83/99  soft-tissue]
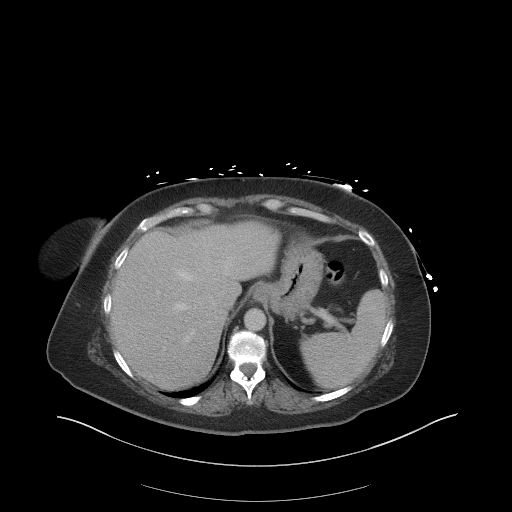
[im 83/99  bone]
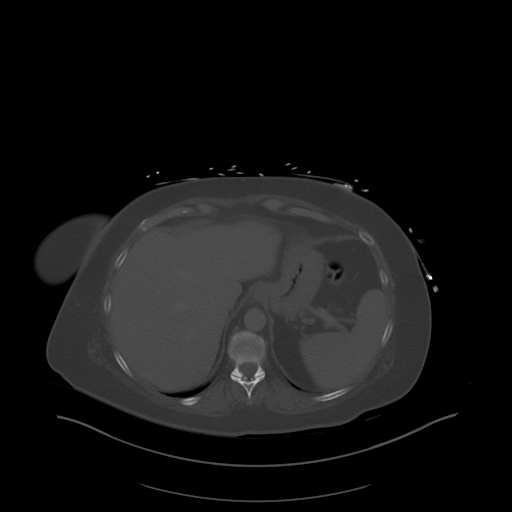
[im 93/99  soft-tissue]
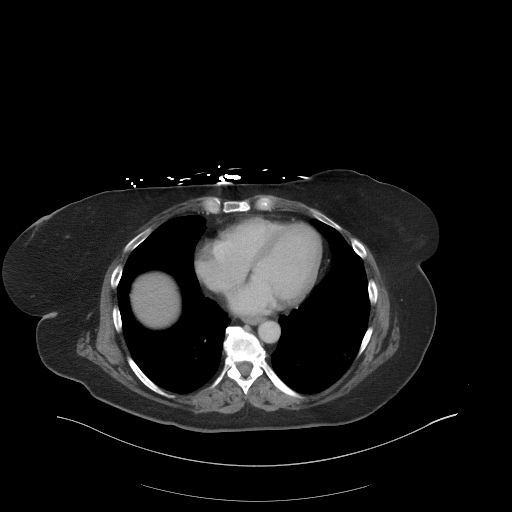

[Series 6: a/p w/ cor · coronal · 0.96mm/px · 3 of 141 slices shown]
[im 47/141  soft-tissue]
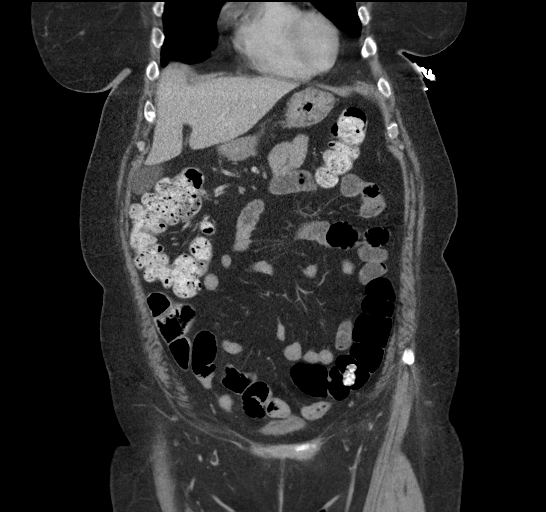
[im 63/141  soft-tissue]
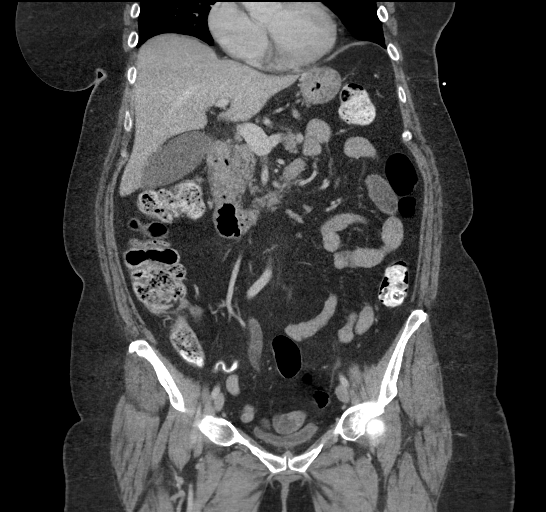
[im 78/141  soft-tissue]
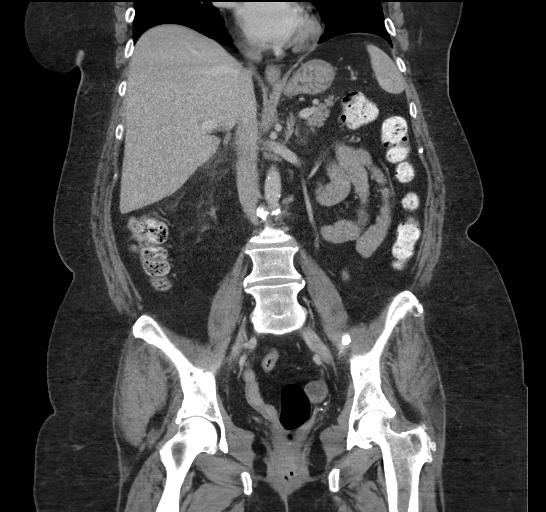

[13 of 46 positions shown; findings below may reference images not displayed]

FINDINGS: Lower chest: Stable mild lung base scarring. Mildly increased lung
volumes and less lung base atelectasis. No pericardial or pleural
effusion.

Hepatobiliary: Liver enhancement remains normal. The gallbladder is
larger since 12/26/2018, but not definitely inflamed. No bile duct
enlargement identified.

Pancreas: The pancreas appears stable despite continued inflammation
in the 2nd portion of the duodenum. No pancreatic ductal dilatation.

Spleen: Stable, negative.

Adrenals/Urinary Tract: Normal adrenal glands.

Left renal enhancement and contrast excretion remains within normal
limits. Decompressed proximal left ureter.

Heterogeneous enhancement of the right kidney persists. Multifocal
right nephrolithiasis with calculi up to 6 millimeters in the pelvis
and lower pole collecting system. Right periureteral stranding
without right hydroureter. Diminutive and unremarkable urinary
bladder. On the delayed images right renal enhancement remains
heterogeneous.

Stomach/Bowel: There is a rectal anastomosis with no adverse
features on series 3, image 84. Upstream large bowel with mild gas
and some retained stool mixed with oral contrast. Greater volume of
retained stool from the splenic flexure proximally. Normal appendix
containing oral contrast on series 3, image 71. No large bowel
inflammation. Negative terminal ileum. No dilated small bowel.

No free air. The proximal stomach appears normal. But there is
inflammation of the proximal duodenum maximal in the 2nd portion
which appears thick walled with surrounding stranding (series 3,
image 35). The lumen does appear irregular in this region such as on
series 3, image 32. No extraluminal gas or collection identified.
The distal duodenum remains normal.

Vascular/Lymphatic: Major arterial structures are patent with mild
aortic atherosclerosis. Portal venous system is patent. No
lymphadenopathy.

Reproductive: Surgically absent.

Other: No pelvic free fluid.  Stable mild presacral stranding.

Musculoskeletal: Stable visualized osseous structures. There is a
right S3 sacral nerve stimulator device with generator along the
left flank, unchanged.
IMPRESSION: 1. Continued inflammation of the proximal Duodenum which appears
thick-walled and with luminal irregularity suggesting diverticulum
or ulcer.
No interval perforation is identified.  No abscess.
As before, infectious duodenitis or inflamed ulcer disease is
favored. EGD likely would be most valuable.
2. Continued abnormal enhancement of the right kidney with right
renal and ureter inflammation compatible with Ascending
Infection/pyelonephritis. No renal abscess. There is superimposed
right nephrolithiasis, but no obstructive uropathy suspected.
3. Stable abdomen and pelvis otherwise.

ADDENDUM:
Study discussed by telephone with YIK FAI GORGOR on 12/29/2018
at 2829 hours.

Given the setting of new anemia, we discussed that I favor a
bleeding duodenal ulcer.

*** End of Addendum ***
FINDINGS: Lower chest: Stable mild lung base scarring. Mildly increased lung
volumes and less lung base atelectasis. No pericardial or pleural
effusion.

Hepatobiliary: Liver enhancement remains normal. The gallbladder is
larger since 12/26/2018, but not definitely inflamed. No bile duct
enlargement identified.

Pancreas: The pancreas appears stable despite continued inflammation
in the 2nd portion of the duodenum. No pancreatic ductal dilatation.

Spleen: Stable, negative.

Adrenals/Urinary Tract: Normal adrenal glands.

Left renal enhancement and contrast excretion remains within normal
limits. Decompressed proximal left ureter.

Heterogeneous enhancement of the right kidney persists. Multifocal
right nephrolithiasis with calculi up to 6 millimeters in the pelvis
and lower pole collecting system. Right periureteral stranding
without right hydroureter. Diminutive and unremarkable urinary
bladder. On the delayed images right renal enhancement remains
heterogeneous.

Stomach/Bowel: There is a rectal anastomosis with no adverse
features on series 3, image 84. Upstream large bowel with mild gas
and some retained stool mixed with oral contrast. Greater volume of
retained stool from the splenic flexure proximally. Normal appendix
containing oral contrast on series 3, image 71. No large bowel
inflammation. Negative terminal ileum. No dilated small bowel.

No free air. The proximal stomach appears normal. But there is
inflammation of the proximal duodenum maximal in the 2nd portion
which appears thick walled with surrounding stranding (series 3,
image 35). The lumen does appear irregular in this region such as on
series 3, image 32. No extraluminal gas or collection identified.
The distal duodenum remains normal.

Vascular/Lymphatic: Major arterial structures are patent with mild
aortic atherosclerosis. Portal venous system is patent. No
lymphadenopathy.

Reproductive: Surgically absent.

Other: No pelvic free fluid.  Stable mild presacral stranding.

Musculoskeletal: Stable visualized osseous structures. There is a
right S3 sacral nerve stimulator device with generator along the
left flank, unchanged.
IMPRESSION: 1. Continued inflammation of the proximal Duodenum which appears
thick-walled and with luminal irregularity suggesting diverticulum
or ulcer.
No interval perforation is identified.  No abscess.
As before, infectious duodenitis or inflamed ulcer disease is
favored. EGD likely would be most valuable.
2. Continued abnormal enhancement of the right kidney with right
renal and ureter inflammation compatible with Ascending
Infection/pyelonephritis. No renal abscess. There is superimposed
right nephrolithiasis, but no obstructive uropathy suspected.
3. Stable abdomen and pelvis otherwise.

## 2020-09-15 ENCOUNTER — Other Ambulatory Visit: Payer: Self-pay

## 2020-09-15 ENCOUNTER — Encounter: Payer: Self-pay | Admitting: Physical Therapy

## 2020-09-15 ENCOUNTER — Ambulatory Visit: Payer: 59 | Attending: Gastroenterology | Admitting: Physical Therapy

## 2020-09-15 DIAGNOSIS — R159 Full incontinence of feces: Secondary | ICD-10-CM | POA: Diagnosis present

## 2020-09-15 DIAGNOSIS — R278 Other lack of coordination: Secondary | ICD-10-CM

## 2020-09-15 DIAGNOSIS — M6281 Muscle weakness (generalized): Secondary | ICD-10-CM | POA: Diagnosis not present

## 2020-09-15 NOTE — Patient Instructions (Addendum)
Toileting Techniques for Bowel Movements    An Evacuation/Defecation Plan   Here are the 4 basic points:  Lean forward enough for your elbows to rest on your knees Support your feet on the floor or use a low stool if your feet don't touch the floor  Push out your belly as if you have swallowed a beach ball--you should feel a widening of your waist. "Belly Big, Belly Hard" Open and relax your pelvic floor muscles, rather than tightening around the anus  While you are sitting on the toilet pay attention to the following areas: Jaw and mouth position- relaxed not clenched Angle of your hips - leaning slightly forward Whether your feet touch the ground or not - should be flat and supported Arm placement - rest against your thighs Spine position - flat back Waist Breathing - exhale as you push (like blowing up a balloon or try using other sounds such as ahhhh, shhhhh, ohhhh or grrrrrrr) Beulah Gandy - hard and tight as you push Anus (opening of the anal canal) - relaxed and open as you push Anus - Tighten and lift pulling the muscle back in after you are done or if taking a break  If you are not successful after 10-15 minutes, try again later.  Avoid negative self-talk about your toileting experience.   Read this for more details and ask your PT if you need suggestions for adjustments or limitations:  Sitting on the toilet  a) Make sure your feet are supported - flat on the floor or step stool b) Many people find it effective to lean forward or raise their knees.  Propping your feet on a step stool (Squatty Potty is a brand name) can help the muscles around the anus to relax  c) When you lean forward, place your forearms on your thighs for support  Relaxing Breathe deeply and slowly in through your nose and out through your mouth. To become aware of how to relax your muscles, contracting and releasing muscles can be helpful.  Pull your pelvic floor muscles in tightly by using the image of  holding back gas, or closing around the anus (visualize making a circle smaller) and lifting the anus up and in.  Then release the muscles and your anus should drop down and feel open. Repeat 5 times ending with the feeling of relaxation. Keep your pelvic floor muscles relaxed; let your belly bulge out. The digestive tract starts at the mouth and ends at the anal opening, so be sure to relax both ends of the tube.  Place your tongue on the roof of your mouth with your teeth separated.  This helps relax your mouth and will help to relax the anus at the same time.  Emptying (defecation) a) Keep your pelvic floor and sphincter relaxed, then bulge your anal muscles.  Make the anal opening wide.  b) Stick your belly out as if you have swallowed a beach ball. c) Make your belly wall hard using your belly muscles while continuing to breathe. Doing this makes it easier to open your anus. d) Breath out and give a grunt (or try using other sounds such as ahhhh, shhhhh, ohhhh or grrrrrrr). e)  Can also try to act as if you are blowing up a balloon as you push  4) Finishing a) As you finish your bowel movement, pull the pelvic floor muscles up and in.  This will leave your anus in the proper place rather than remaining pushed out and down. If  you leave your anus pushed out and down, it will start to feel as though that is normal and give you incorrect signals about needing to have a bowel movement.  If does not fully empty move hips in circles, back and forth, stand up and do the same them sit again.   Try using coconut oil on the vulvar area and rectum daily  [facility] [address] [place], [place] [address] Phone # [phone] Fax [phone]

## 2020-09-15 NOTE — Therapy (Signed)
[facility] [address], [place], [place], [address] Phone: [phone]   Fax:  [phone]  Physical Therapy Treatment  Patient Details  Name: Kelli Cruz MRN: [id number] Date of Birth: December 31, 1960 Referring Provider (PT): Dr. Thornton Park   Encounter Date: 09/15/2020   PT End of Session - 09/15/20 1033     Visit Number 3    Date for PT Re-Evaluation 11/28/20    Authorization Type cigna    Authorization Time Period 1/22-12/31/22    PT Start Time 1015    PT Stop Time 1055    PT Time Calculation (min) 40 min    Activity Tolerance Patient tolerated treatment well;No increased pain    Behavior During Therapy WFL for tasks assessed/performed             Past Medical History:  Diagnosis Date   Anemia    Anxiety    Arthritis    Bipolar 1 disorder ([address])    Cancer ([place])    sigmoid colon cancer   Depression    Diabetes mellitus    type II   Endometriosis    Family history of malignant neoplasm of gastrointestinal tract    Ganglion cyst    GERD (gastroesophageal reflux disease)    Hyperlipidemia    Hypothyroidism    Insomnia due to mental condition     Past Surgical History:  Procedure Laterality Date   ABDOMINAL HYSTERECTOMY     ABDOMINAL HYSTERECTOMY     BIOPSY  12/31/2018   Procedure: BIOPSY;  Surgeon: Doran Stabler, MD;  Location: [facility] ENDOSCOPY;  Service: Gastroenterology;;   COLON RESECTION  June 2010   [place] AND CURETTAGE OF UTERUS  2006   miscarriage    ESOPHAGOGASTRODUODENOSCOPY N/A 12/31/2018   Procedure: ESOPHAGOGASTRODUODENOSCOPY (EGD);  Surgeon: Doran Stabler, MD;  Location: [facility];  Service: Gastroenterology;  Laterality: N/A;   GANGLION CYST EXCISION     HERNIA REPAIR     INTERSTIM IMPLANT PLACEMENT Left 12/11/2012   stimulator is on the left but the electrodes go to the right   [place]    D & C for endometriosis    There were no vitals filed for this visit.   Subjective Assessment - 09/15/20 1019     Subjective I had an episode a couple days ago and the first one in awhile. The [facility] reveleum is helping. when the urge hits me I am able to hold the poop with my breathing and using the muscles. Still do not fully empty my bowels    Patient Stated Goals be able to get to the bathroom before she poops in her pants    Currently in Pain? No/denies                            Pelvic Floor Special Questions - 09/15/20 0001     Pelvic Floor Internal Exam Patient confirms identfication and approves PT to assess pelvic floor and treatment    Exam Type Vaginal    Strength fair squeeze, definite lift   vaginally              [facility] Adult PT Treatment/Exercise - 09/15/20 0001       Therapeutic Activites    Therapeutic Activities Other Therapeutic Activities    Other Therapeutic Activities  education on correct toileting technique with feet on stool, breathing into stomach and out with pursed lips, keeping shoulders relaxed; then if not fully empty to do hip movements on the commode and standing      Neuro Re-ed    Neuro Re-ed Details  vaginal contraction with holding 5 seconds and verbal cues to fully relax      Manual Therapy   Manual Therapy Internal Pelvic Floor    Internal Pelvic Floor sidely manual work to the levator ani and perineal body and posterior introitus with minimal to no pain for first time                    PT Education - 09/15/20 1058     Education Details educated patient on toileting techniques; using coconut oil on the vulvar and rectal area    Person(s) Educated Patient    Methods Explanation;Demonstration;Handout    Comprehension Returned demonstration;Verbalized understanding              PT Short Term Goals - 09/15/20 1029       PT SHORT TERM GOAL #1   Title independent with initial HEP    Time  4    Period Weeks    Status On-going      PT SHORT TERM GOAL #2   Title able to bulge the rectum instead of contract when she has a bowel movement    Time 4    Period Weeks    Status On-going      PT SHORT TERM GOAL #3   Title understand how to care for the rectal and vaginal tissue to reduce the redness    Time 4    Period Weeks    Status Achieved               PT Long Term Goals - 07/29/20 1737       PT LONG TERM GOAL #1   Title independent with advanced HEP for pelvic floor coordination and strength    Time 4    Period Months    Status New    Target Date 11/28/20      PT LONG TERM GOAL #2   Title able to contract the vaginal with strength >/3/5 to reduce her urinary leakage >/= 50%    Time 4    Period Months    Status New    Target Date 11/28/20      PT LONG TERM GOAL #3   Title able to contract the anal sphincter with strength >/= 3/5 to stop fecal leakage as she is walking to the bathroom    Time 4    Period Months    Status New    Target Date 11/28/20      PT LONG TERM GOAL #4   Title reduction of pelvic floor muscles trigger points so she is able to have a vaginal exam and the pelvic floor mucsles can contract correctly    Time 4    Period Months    Status New    Target Date 11/28/20                   Plan - 09/15/20 1105     Clinical Impression Statement Patient is able to hold the stool 20% better with using her correct breathing. Her vaginal and anal area tissue is not as dry. Patient was able to tolerate the therapist to perform manual work to the vaginal tissue better than at  the initial eval. Patient has learned correct toileting technique and how to fully empty her stool. Patient pelvic floor strength has increased from 1/5 to 3/5 but still needs some verbal cues to relax fully. Patient will benefit from skilled therapy to improve pelvic floor coordination and tissue mobility, reduce pelvic pain and reduce leakage.    Personal Factors  and Comorbidities Comorbidity 3+;Sex;Past/Current Experience;Fitness;Age    Comorbidities s/p Sigmoid cancer with colon resection; Abdomial hysterectomy; Endometriosis; Hernia repair; Interstim implant placement; Hypothyroidism; Bipolar;    Examination-Activity Limitations Continence;Toileting;Stairs;Stand;Hygiene/Grooming;Lift;Squat;Bend;Locomotion Level;Transfers    Examination-Participation Restrictions Interpersonal Relationship;Community Activity;Occupation    Stability/Clinical Decision Making Evolving/Moderate complexity    Rehab Potential Good    PT Frequency 1x / week    PT Duration Other (comment)   4 months   PT Treatment/Interventions ADLs/Self Care Home Management;Biofeedback;Electrical Stimulation;Cryotherapy;Moist Heat;Neuromuscular re-education;Therapeutic exercise;Therapeutic activities;Functional mobility training;Patient/family education;Manual techniques;Dry needling;Spinal Manipulations;Joint Manipulations    PT Next Visit Plan manual work rectally if able to tolerate it, see if able to empty her rectum, work on increasing hip extension, ER, and check pelvic alignment, work on bulging the pelvic floor    PT Home Exercise Plan Access Code: [email or web address]    Consulted and Agree with Plan of Care Patient             Patient will benefit from skilled therapeutic intervention in order to improve the following deficits and impairments:  Decreased coordination, Decreased range of motion, Increased fascial restricitons, Decreased endurance, Increased muscle spasms, Decreased activity tolerance, Decreased skin integrity, Pain, Decreased mobility, Decreased strength  Visit Diagnosis: Muscle weakness (generalized)  Other lack of coordination  Incontinence of feces, unspecified fecal incontinence type     Problem List Patient Active Problem List   Diagnosis Date Noted   Loose stools 04/10/2020   Fatigue 09/12/2019   Leg cramps 08/15/2019   Plantar wart of right foot 08/15/2019    Chronic kidney disease, stage 3b ([place]) 02/03/2019   History of GI bleed 12/29/2018   Anemia 02/19/2017   Vitamin D deficiency 02/19/2017   Knee pain, right 11/10/2016   Proteinuria 02/18/2016   Exposure to communicable disease 08/04/2015   Obesity 02/04/2015   Hypersomnia with sleep apnea 01/06/2015   Insomnia due to mental condition    Tingling 08/12/2014   Burning sensation of mouth 08/12/2014   Numbness 10/24/2011   Routine general medical examination at a health care facility 10/24/2011   TMJ arthralgia 08/14/2011   Poor posture 07/26/2011   B12 deficiency 04/13/2010   ADENOCARCINOMA, SIGMOID COLON 08/11/2008   Hyperlipidemia associated with type 2 diabetes mellitus ([place]) 07/08/2008   Hypothyroidism 09/12/2006   Diabetes type 2, controlled ([place]) 09/12/2006   Depression with anxiety 09/12/2006   ALLERGIC RHINITIS, SEASONAL 09/12/2006   FIBROCYSTIC BREAST DISEASE 09/12/2006   MIGRAINES, HX OF 09/12/2006    Earlie Counts, PT 09/15/20 11:11 AM  [facility] [address], [place], [place], [address] Phone: [phone]   Fax:  [phone]  Name: Kelli Cruz MRN: [id number] Date of Birth: Jun 25, 1960

## 2020-09-22 ENCOUNTER — Other Ambulatory Visit: Payer: Self-pay

## 2020-09-22 ENCOUNTER — Encounter: Payer: Self-pay | Admitting: Physical Therapy

## 2020-09-22 ENCOUNTER — Ambulatory Visit: Payer: 59 | Admitting: Physical Therapy

## 2020-09-22 DIAGNOSIS — R278 Other lack of coordination: Secondary | ICD-10-CM

## 2020-09-22 DIAGNOSIS — M6281 Muscle weakness (generalized): Secondary | ICD-10-CM

## 2020-09-22 DIAGNOSIS — R159 Full incontinence of feces: Secondary | ICD-10-CM

## 2020-09-22 NOTE — Patient Instructions (Signed)
Access Code: [email or web address] URL: [email or web address] Date: 09/22/2020 Prepared by: Earlie Counts  Exercises Supine Hamstring Stretch with Strap - 1 x daily - 7 x weekly - 1 sets - 2 reps - 30 sec hold Supine ITB Stretch with Strap - 1 x daily - 7 x weekly - 1 sets - 2 reps - 30 sec hold Supine Figure 4 Piriformis Stretch - 1 x daily - 7 x weekly - 1 sets - 2 reps - 30 sec hold Supine Pelvic Floor Stretch - 1 x daily - 7 x weekly - 1 sets - 1 reps - 30 sec hold Supine Piriformis Stretch with Leg Straight - 1 x daily - 7 x weekly - 1 sets - 2 reps - 30 sec hold Supine Diaphragmatic Breathing - 2 x daily - 7 x weekly - 1 sets - 10 reps Prone Hip Extension with Pillow Under Abdomen - 1 x daily - 7 x weekly - 1 sets - 10 reps Sidelying Reverse Clamshell - 1 x daily - 7 x weekly - 1 sets - 10 reps Supine Pelvic Floor Contraction - 2 x daily - 7 x weekly - 1 sets - 10 reps - 5 sec hold [facility] [address], [place], [place] [address] Phone # [phone] Fax [phone]

## 2020-09-22 NOTE — Therapy (Signed)
[facility] [address] [place], [place], [address] Phone: [phone]   Fax:  [phone]  Physical Therapy Treatment  Patient Details  Name: Kelli Cruz MRN: [id number] Date of Birth: 1960/04/02 Referring Provider (PT): Dr. Thornton Park   Encounter Date: 09/22/2020   PT End of Session - 09/22/20 1112     Visit Number 4    Date for PT Re-Evaluation 11/28/20    Authorization Type cigna    Authorization Time Period 1/22-12/31/22    PT Start Time 1100    PT Stop Time 1140    PT Time Calculation (min) 40 min    Activity Tolerance Patient tolerated treatment well;No increased pain    Behavior During Therapy WFL for tasks assessed/performed             Past Medical History:  Diagnosis Date   Anemia    Anxiety    Arthritis    Bipolar 1 disorder ([place])    Cancer ([place])    sigmoid colon cancer   Depression    Diabetes mellitus    type II   Endometriosis    Family history of malignant neoplasm of gastrointestinal tract    Ganglion cyst    GERD (gastroesophageal reflux disease)    Hyperlipidemia    Hypothyroidism    Insomnia due to mental condition     Past Surgical History:  Procedure Laterality Date   ABDOMINAL HYSTERECTOMY     ABDOMINAL HYSTERECTOMY     BIOPSY  12/31/2018   Procedure: BIOPSY;  Surgeon: Doran Stabler, MD;  Location: [facility] ENDOSCOPY;  Service: Gastroenterology;;   COLON RESECTION  June 2010   [place] AND CURETTAGE OF UTERUS  2006   miscarriage    ESOPHAGOGASTRODUODENOSCOPY N/A 12/31/2018   Procedure: ESOPHAGOGASTRODUODENOSCOPY (EGD);  Surgeon: Doran Stabler, MD;  Location: [facility];  Service: Gastroenterology;  Laterality: N/A;   GANGLION CYST EXCISION     HERNIA REPAIR     INTERSTIM IMPLANT PLACEMENT Left 12/11/2012   stimulator is on the left but the electrodes go to the right   [place]   D & C for endometriosis    There were no vitals filed for this visit.   Subjective Assessment - 09/22/20 1108     Subjective I am a little better. I have an interview for a job. I have had my depression medication change.    Patient Stated Goals be able to get to the bathroom before she poops in her pants    Currently in Pain? No/denies    Multiple Pain Sites No                [facility] PT Assessment - 09/22/20 0001       Palpation   SI assessment  left ilum rotated posteriorly                           [facility] Adult PT Treatment/Exercise - 09/22/20 0001       Lumbar Exercises: Stretches   Hip Flexor Stretch Left;Right;4 reps;20 seconds    Hip Flexor Stretch Limitations therapist assisting    Other Lumbar Stretch Exercise prone hip ER with assistance 10x each leg      Lumbar Exercises: Aerobic   Nustep level 1 for 5 minutes while assessing patient  Lumbar Exercises: Supine   Ab Set 10 reps    AB Set Limitations with anal contraction      Lumbar Exercises: Sidelying   Clam Left;10 reps;1 second    Clam Limitations reverse clam      Knee/Hip Exercises: Prone   Straight Leg Raises Strengthening;Right;Left;1 set;10 reps    Straight Leg Raises Limitations VC to squeeze buttocks prior to lifting leg      Manual Therapy   Manual Therapy Joint mobilization    Joint Mobilization PA and rotational mobilization to L1-L5; sacral springing; anterior mobilization of femoral head bil. grade 3                    PT Education - 09/22/20 1136     Education Details Access Code: [email or web address]    Person(s) Educated Patient    Methods Explanation;Demonstration;Verbal cues;Handout    Comprehension Verbalized understanding;Returned demonstration              PT Short Term Goals - 09/22/20 1143       PT SHORT TERM GOAL #1   Title independent with initial HEP    Time 4    Period Weeks    Status Achieved      PT SHORT TERM GOAL #2   Title  able to bulge the rectum instead of contract when she has a bowel movement    Time 4    Status On-going      PT SHORT TERM GOAL #3   Title understand how to care for the rectal and vaginal tissue to reduce the redness    Time 4    Period Weeks    Status Achieved               PT Long Term Goals - 09/22/20 1143       PT LONG TERM GOAL #1   Title independent with advanced HEP for pelvic floor coordination and strength    Time 4    Period Months    Status On-going      PT LONG TERM GOAL #2   Title able to contract the vaginal with strength >/3/5 to reduce her urinary leakage >/= 50%    Time 4    Period Months    Status On-going      PT LONG TERM GOAL #3   Title able to contract the anal sphincter with strength >/= 3/5 to stop fecal leakage as she is walking to the bathroom    Time 4    Period Months    Status On-going      PT LONG TERM GOAL #4   Title reduction of pelvic floor muscles trigger points so she is able to have a vaginal exam and the pelvic floor mucsles can contract correctly    Time 4    Period Months    Status On-going                   Plan - 09/22/20 1139     Clinical Impression Statement Patient reports she is able to empty her rectum by 20%. She is not leaking stool as much. Patient is able to contract her pelvic floor and lower abdominals much better. She is able to feel the rectal contraction and therapist can feel it externally. Patient pelvis was in correct alignment after manual work. She is able to extend her hips better after learning to contract her gluteals prior to extending her hips. Patient will benefit from skilled  therapy to improve pelvic floor coordination and tissue mobility, reduce pelvic pain and reduce leakage.    Personal Factors and Comorbidities Comorbidity 3+;Sex;Past/Current Experience;Fitness;Age    Comorbidities s/p Sigmoid cancer with colon resection; Abdomial hysterectomy; Endometriosis; Hernia repair; Interstim  implant placement; Hypothyroidism; Bipolar;    Examination-Activity Limitations Continence;Toileting;Stairs;Stand;Hygiene/Grooming;Lift;Squat;Bend;Locomotion Level;Transfers    Examination-Participation Restrictions Interpersonal Relationship;Community Activity;Occupation    Stability/Clinical Decision Making Evolving/Moderate complexity    Rehab Potential Good    PT Frequency 1x / week    PT Duration Other (comment)   4 months   PT Treatment/Interventions ADLs/Self Care Home Management;Biofeedback;Electrical Stimulation;Cryotherapy;Moist Heat;Neuromuscular re-education;Therapeutic exercise;Therapeutic activities;Functional mobility training;Patient/family education;Manual techniques;Dry needling;Spinal Manipulations;Joint Manipulations    PT Next Visit Plan manual work rectally if able to tolerate it, measure  ER, work on core strength, holding pelvic floor contraction longer and add sitting    PT Home Exercise Plan Access Code: [email or web address]    Consulted and Agree with Plan of Care Patient             Patient will benefit from skilled therapeutic intervention in order to improve the following deficits and impairments:  Decreased coordination, Decreased range of motion, Increased fascial restricitons, Decreased endurance, Increased muscle spasms, Decreased activity tolerance, Decreased skin integrity, Pain, Decreased mobility, Decreased strength  Visit Diagnosis: Muscle weakness (generalized)  Other lack of coordination  Incontinence of feces, unspecified fecal incontinence type     Problem List Patient Active Problem List   Diagnosis Date Noted   Loose stools 04/10/2020   Fatigue 09/12/2019   Leg cramps 08/15/2019   Plantar wart of right foot 08/15/2019   Chronic kidney disease, stage 3b ([place]) 02/03/2019   History of GI bleed 12/29/2018   Anemia 02/19/2017   Vitamin D deficiency 02/19/2017   Knee pain, right 11/10/2016   Proteinuria 02/18/2016   Exposure to communicable  disease 08/04/2015   Obesity 02/04/2015   Hypersomnia with sleep apnea 01/06/2015   Insomnia due to mental condition    Tingling 08/12/2014   Burning sensation of mouth 08/12/2014   Numbness 10/24/2011   Routine general medical examination at a health care facility 10/24/2011   TMJ arthralgia 08/14/2011   Poor posture 07/26/2011   B12 deficiency 04/13/2010   ADENOCARCINOMA, SIGMOID COLON 08/11/2008   Hyperlipidemia associated with type 2 diabetes mellitus ([place]) 07/08/2008   Hypothyroidism 09/12/2006   Diabetes type 2, controlled ([place]) 09/12/2006   Depression with anxiety 09/12/2006   ALLERGIC RHINITIS, SEASONAL 09/12/2006   FIBROCYSTIC BREAST DISEASE 09/12/2006   MIGRAINES, HX OF 09/12/2006    Earlie Counts, PT 09/22/20 11:44 AM  [facility] [address], [place], [place], [address] Phone: [phone]   Fax:  [phone]  Name: CALLIEANN KROMER MRN: [id number] Date of Birth: [birth year]

## 2020-09-23 ENCOUNTER — Encounter: Payer: 59 | Admitting: Physical Therapy

## 2020-09-30 ENCOUNTER — Telehealth: Payer: Self-pay | Admitting: Family Medicine

## 2020-09-30 ENCOUNTER — Telehealth: Payer: Self-pay

## 2020-09-30 DIAGNOSIS — D500 Iron deficiency anemia secondary to blood loss (chronic): Secondary | ICD-10-CM

## 2020-09-30 DIAGNOSIS — E1169 Type 2 diabetes mellitus with other specified complication: Secondary | ICD-10-CM

## 2020-09-30 DIAGNOSIS — E559 Vitamin D deficiency, unspecified: Secondary | ICD-10-CM

## 2020-09-30 DIAGNOSIS — E538 Deficiency of other specified B group vitamins: Secondary | ICD-10-CM

## 2020-09-30 DIAGNOSIS — E119 Type 2 diabetes mellitus without complications: Secondary | ICD-10-CM

## 2020-09-30 DIAGNOSIS — E039 Hypothyroidism, unspecified: Secondary | ICD-10-CM

## 2020-09-30 NOTE — Telephone Encounter (Signed)
-----   Message from Ellamae Sia sent at 09/13/2020 10:09 AM EDT ----- Regarding: Lab orders for Friday, 8.19.22 Patient is scheduled for CPX labs, please order future labs, Thanks , Karna Christmas

## 2020-09-30 NOTE — Telephone Encounter (Signed)
Let me know what she needs, this is a chronic problem we want to help with  Thanks

## 2020-09-30 NOTE — Telephone Encounter (Signed)
Called pt and no answer and VM box was full

## 2020-09-30 NOTE — Telephone Encounter (Signed)
Patient called and left message on voice mail. She states that she was seen by dentist and mentioned her dry tongue. There are labs that he would like to have done. Patient wanted to know if she can let our office know what labs are needed and they be added to labs she has scheduled by Dr. Glori Bickers.

## 2020-10-01 ENCOUNTER — Other Ambulatory Visit: Payer: 59

## 2020-10-06 ENCOUNTER — Encounter: Payer: 59 | Admitting: Physical Therapy

## 2020-10-07 NOTE — Telephone Encounter (Signed)
Called pt and still no answer and pt's VM box is still full

## 2020-10-08 ENCOUNTER — Encounter: Payer: 59 | Admitting: Family Medicine

## 2020-10-09 ENCOUNTER — Encounter: Payer: Self-pay | Admitting: Family Medicine

## 2020-10-09 DIAGNOSIS — E1169 Type 2 diabetes mellitus with other specified complication: Secondary | ICD-10-CM

## 2020-10-09 DIAGNOSIS — N1832 Chronic kidney disease, stage 3b: Secondary | ICD-10-CM

## 2020-10-09 DIAGNOSIS — E119 Type 2 diabetes mellitus without complications: Secondary | ICD-10-CM

## 2020-10-09 DIAGNOSIS — E039 Hypothyroidism, unspecified: Secondary | ICD-10-CM

## 2020-10-09 DIAGNOSIS — R5382 Chronic fatigue, unspecified: Secondary | ICD-10-CM

## 2020-10-09 DIAGNOSIS — R208 Other disturbances of skin sensation: Secondary | ICD-10-CM

## 2020-10-09 DIAGNOSIS — E538 Deficiency of other specified B group vitamins: Secondary | ICD-10-CM

## 2020-10-09 DIAGNOSIS — E559 Vitamin D deficiency, unspecified: Secondary | ICD-10-CM

## 2020-10-09 DIAGNOSIS — E785 Hyperlipidemia, unspecified: Secondary | ICD-10-CM

## 2020-10-11 ENCOUNTER — Encounter: Payer: 59 | Admitting: Physical Therapy

## 2020-10-11 NOTE — Telephone Encounter (Signed)
Pt sent a mychart message with labs she would like done, please review mychart

## 2020-10-27 ENCOUNTER — Other Ambulatory Visit: Payer: 59

## 2020-11-02 ENCOUNTER — Encounter: Payer: Self-pay | Admitting: Family Medicine

## 2020-11-02 ENCOUNTER — Ambulatory Visit (INDEPENDENT_AMBULATORY_CARE_PROVIDER_SITE_OTHER): Payer: 59 | Admitting: Family Medicine

## 2020-11-02 ENCOUNTER — Other Ambulatory Visit: Payer: Self-pay

## 2020-11-02 VITALS — BP 122/76 | HR 75 | Temp 97.7°F | Ht 62.0 in | Wt 205.4 lb

## 2020-11-02 DIAGNOSIS — Z0000 Encounter for general adult medical examination without abnormal findings: Secondary | ICD-10-CM | POA: Diagnosis not present

## 2020-11-02 DIAGNOSIS — E785 Hyperlipidemia, unspecified: Secondary | ICD-10-CM | POA: Diagnosis not present

## 2020-11-02 DIAGNOSIS — E559 Vitamin D deficiency, unspecified: Secondary | ICD-10-CM | POA: Diagnosis not present

## 2020-11-02 DIAGNOSIS — N1832 Chronic kidney disease, stage 3b: Secondary | ICD-10-CM | POA: Diagnosis not present

## 2020-11-02 DIAGNOSIS — F418 Other specified anxiety disorders: Secondary | ICD-10-CM

## 2020-11-02 DIAGNOSIS — R5382 Chronic fatigue, unspecified: Secondary | ICD-10-CM

## 2020-11-02 DIAGNOSIS — E538 Deficiency of other specified B group vitamins: Secondary | ICD-10-CM

## 2020-11-02 DIAGNOSIS — Z23 Encounter for immunization: Secondary | ICD-10-CM | POA: Diagnosis not present

## 2020-11-02 DIAGNOSIS — E119 Type 2 diabetes mellitus without complications: Secondary | ICD-10-CM | POA: Diagnosis not present

## 2020-11-02 DIAGNOSIS — E1169 Type 2 diabetes mellitus with other specified complication: Secondary | ICD-10-CM

## 2020-11-02 DIAGNOSIS — E039 Hypothyroidism, unspecified: Secondary | ICD-10-CM | POA: Diagnosis not present

## 2020-11-02 DIAGNOSIS — Z6837 Body mass index (BMI) 37.0-37.9, adult: Secondary | ICD-10-CM

## 2020-11-02 DIAGNOSIS — D500 Iron deficiency anemia secondary to blood loss (chronic): Secondary | ICD-10-CM

## 2020-11-02 MED ORDER — CYANOCOBALAMIN 1000 MCG/ML IJ SOLN
1000.0000 ug | Freq: Once | INTRAMUSCULAR | Status: AC
  Administered 2020-11-02: 1000 ug via INTRAMUSCULAR

## 2020-11-02 NOTE — Progress Notes (Signed)
Subjective:    Patient ID: Kelli Cruz, female    DOB: [birth year], 60 y.o.   MRN: [id number]  This visit occurred during the SARS-CoV-2 public health emergency.  Safety protocols were in place, including screening questions prior to the visit, additional usage of staff PPE, and extensive cleaning of exam room while observing appropriate contact time as indicated for disinfecting solutions.   HPI Here for health maintenance exam and to review chronic medical problems    Wt Readings from Last 3 Encounters:  11/02/20 205 lb 6 oz (93.2 kg)  06/03/20 201 lb 9.6 oz (91.4 kg)  04/09/20 205 lb 3 oz (93.1 kg)   37.56 kg/m  Feeling pretty good  Last few months were tough - old job ups and downs Starts a new job - is excited for that [employer])  A good change for her   Gained some weight and now lost 6 lb  Happy about this  Watching what she eats and not snacking as much    Zoster status -not immunized but interested in it  Covid immunized  Flu shot =today  Tdap 6/17   Mammogram 2018-had one after but missed last year  [facility] Mother had [facility] cancer  M aunt had breast cancer as well as paternal aunt Self breast exam -no lumps   She has a cyst under L arm that drained   Oph exam 1/21  Colonoscopy 8/20 with 5 y recall (personal h/o colon cancer)  Father had colon cancer as well as PGM  [facility] Due for a visit/annual   Depression screen [facility] 2/9 11/02/2020 11/17/2019 09/18/2018 09/12/2017 08/18/2016  Decreased Interest 0 '1 1 1 1  ' Down, Depressed, Hopeless '1 1 2 ' 0 0  PHQ - 2 Score '1 2 3 1 1  ' Altered sleeping 1 0 0 - 0  Tired, decreased energy '1 1 1 ' - 1  Change in appetite 1 2 0 - 1  Feeling bad or failure about yourself  0 1 2 - 0  Trouble concentrating 0 0 0 - 0  Moving slowly or fidgety/restless 0 0 0 - 0  Suicidal thoughts - 0 0 - 0  PHQ-9 Score '4 6 6 ' - 3  Difficult doing work/chores Not difficult at all - - -  -  Some recent data might be hidden       BP Readings from Last 3 Encounters:  11/02/20 122/76  06/03/20 122/60  04/09/20 124/68   Pulse Readings from Last 3 Encounters:  11/02/20 75  06/03/20 (!) 56  04/09/20 77     Due for labs  Hyperlipidemia Lab Results  Component Value Date   CHOL 162 11/13/2019   HDL 41.70 11/13/2019   LDLCALC 75 03/08/2018   LDLDIRECT 99.0 11/13/2019   TRIG 227.0 (H) 11/13/2019   CHOLHDL 4 11/13/2019  Atorvastatin 10 mg daily   Hypothyroidism Lab Results  Component Value Date   TSH 1.61 11/13/2019   Levothyroxine 125 mcg daily   CKD stage 3b Sees nephrology  Doing a little better/last labs were better  Still needs to drink more water     DM2  Lab Results  Component Value Date   HGBA1C 7.1 (A) 04/09/2020   Due for labs  Metformin 500 mg bid  Glipizide xl 5 mg daily Ace- lisinopril 5 mg daily Statin -[facility] -per pt january Just started eating better in the past 2 weeks  Hopes for improvement  Vit D def  Due for level  Takes 5000 iu vit D daily   Vit B12 def Due for level  Getting shots every 3 mo and due for today  Continues psychiatric care for depression/anxiety   Takes iron for anemia  GI bleed in the past  Takes daily iron  Lab Results  Component Value Date   WBC 8.3 11/13/2019   HGB 11.9 (L) 11/13/2019   HCT 36.5 11/13/2019   MCV 95.5 11/13/2019   PLT 345.0 11/13/2019   Patient Active Problem List   Diagnosis Date Noted   Loose stools 04/10/2020   Fatigue 09/12/2019   Leg cramps 08/15/2019   Plantar wart of right foot 08/15/2019   Chronic kidney disease, stage 3b ([place]) 02/03/2019   History of GI bleed 12/29/2018   Anemia 02/19/2017   Vitamin D deficiency 02/19/2017   Knee pain, right 11/10/2016   Proteinuria 02/18/2016   Exposure to communicable disease 08/04/2015   Obesity 02/04/2015   Hypersomnia with sleep apnea 01/06/2015   Insomnia due to mental condition    Tingling  08/12/2014   Burning sensation of mouth 08/12/2014   Numbness 10/24/2011   Routine general medical examination at a health care facility 10/24/2011   TMJ arthralgia 08/14/2011   Poor posture 07/26/2011   B12 deficiency 04/13/2010   ADENOCARCINOMA, SIGMOID COLON 08/11/2008   Hyperlipidemia associated with type 2 diabetes mellitus ([place]) 07/08/2008   Hypothyroidism 09/12/2006   Diabetes type 2, controlled ([place]) 09/12/2006   Depression with anxiety 09/12/2006   ALLERGIC RHINITIS, SEASONAL 09/12/2006   FIBROCYSTIC BREAST DISEASE 09/12/2006   MIGRAINES, HX OF 09/12/2006   Past Medical History:  Diagnosis Date   Anemia    Anxiety    Arthritis    Bipolar 1 disorder ([place])    Cancer ([place])    sigmoid colon cancer   Depression    Diabetes mellitus    type II   Endometriosis    Family history of malignant neoplasm of gastrointestinal tract    Ganglion cyst    GERD (gastroesophageal reflux disease)    Hyperlipidemia    Hypothyroidism    Insomnia due to mental condition    Past Surgical History:  Procedure Laterality Date   ABDOMINAL HYSTERECTOMY     ABDOMINAL HYSTERECTOMY     BIOPSY  12/31/2018   Procedure: BIOPSY;  Surgeon: Doran Stabler, MD;  Location[facility] ENDOSCOPY;  Service: Gastroenterology;;   COLON RESECTION  June 2010   COLON SURGERY     COLONOSCOPY     DILATION AND CURETTAGE OF UTERUS  2006   miscarriage    ESOPHAGOGASTRODUODENOSCOPY N/A 12/31/2018   Procedure: ESOPHAGOGASTRODUODENOSCOPY (EGD);  Surgeon: Doran Stabler, MD;  Location: [place];  Service: Gastroenterology;  Laterality: N/A;   GANGLION CYST EXCISION     HERNIA REPAIR     INTERSTIM IMPLANT PLACEMENT Left 12/11/2012   stimulator is on the left but the electrodes go to the right   [place]   D & C for endometriosis   Social History   Tobacco Use   Smoking status: Never    Passive exposure: Yes   Smokeless tobacco: Never  Vaping Use   Vaping Use:  Never used  Substance Use Topics   Alcohol use: Yes    Alcohol/week: 0.0 standard drinks    Comment: rare   Drug use: No   Family History  Problem Relation Age of Onset   Breast cancer  Mother    Diabetes Mother    Coronary artery disease Mother    Kidney disease Mother        renal insufficiency   Hyperlipidemia Mother    Diabetes Father    Coronary artery disease Father    Colon cancer Father    Colon cancer Paternal Grandmother    Breast cancer Maternal Aunt    Breast cancer Paternal Aunt    Esophageal cancer Neg Hx    Rectal cancer Neg Hx    Stomach cancer Neg Hx    Allergies  Allergen Reactions   Keflex [Cephalexin] Nausea And Vomiting and Other (See Comments)    Light-headed/dizziness/weakness   Mirabegron Other (See Comments)    Trouble breathing and swollen tongue   Current Outpatient Medications on File Prior to Visit  Medication Sig Dispense Refill   ALPRAZolam (XANAX) 0.5 MG tablet Take 0.5 mg by mouth at bedtime as needed for anxiety (panic attack).      amphetamine-dextroamphetamine (ADDERALL XR) 30 MG 24 hr capsule Take 30 mg by mouth daily at 6 (six) AM.      amphetamine-dextroamphetamine (ADDERALL) 15 MG tablet Take 15 mg by mouth daily at 2 PM.      atorvastatin (LIPITOR) 10 MG tablet Take 1 tablet (10 mg total) by mouth daily. 90 tablet 3   Blood Glucose Monitoring Suppl (ONETOUCH VERIO FLEX SYSTEM) w/Device KIT USE TO CHECK BLOOD SUGAR TWICE A DAY (DX. E11.9) 1 kit 0   cariprazine (VRAYLAR) capsule Take 3 mg by mouth every 3 (three) days.      Cholecalciferol (VITAMIN D3) 125 MCG (5000 UT) CAPS Take 5,000 Units by mouth daily.     citalopram (CELEXA) 40 MG tablet Take 40 mg by mouth at bedtime.      cyanocobalamin (,VITAMIN B-12,) 1000 MCG/ML injection Inject 1 mL (1,000 mcg total) into the muscle every 3 (three) months. every 8 weeks 1 mL 3   Cyanocobalamin (VITAMIN B-12) 5000 MCG TBDP Take 5,000 mcg by mouth daily.     doxepin (SINEQUAN) 50 MG capsule  Take 50 mg by mouth at bedtime.   1   ferrous sulfate 325 (65 FE) MG tablet Take 325 mg by mouth daily with breakfast.     gabapentin (NEURONTIN) 300 MG capsule Take 1 capsule (300 mg total) by mouth 2 (two) times daily. 180 capsule 3   glipiZIDE (GLUCOTROL XL) 5 MG 24 hr tablet Take 1 tablet (5 mg total) by mouth daily. 90 tablet 3   Lancets (ONETOUCH DELICA PLUS DHRCBU38G) MISC USE TO CHECK BLOOD SUGAR TWICE A DAY 200 each 3   levothyroxine (SYNTHROID) 125 MCG tablet Take 1 tablet (125 mcg total) by mouth daily. 90 tablet 3   lisinopril (ZESTRIL) 5 MG tablet Take 1 tablet (5 mg total) by mouth daily. 90 tablet 3   metFORMIN (GLUCOPHAGE) 1000 MG tablet Take 0.5 tablets (500 mg total) by mouth 2 (two) times daily with a meal. 90 tablet 3   ondansetron (ZOFRAN-ODT) 8 MG disintegrating tablet Take 1 tablet (8 mg total) by mouth every 8 (eight) hours as needed for nausea or vomiting. 20 tablet 0   ONETOUCH VERIO test strip USE TO CHECK SUGAR TWICE A DAY 200 strip 1   pantoprazole (PROTONIX) 40 MG tablet Take 1 tablet (40 mg total) by mouth every morning. 90 tablet 3   trimethoprim (TRIMPEX) 100 MG tablet Take 100 mg by mouth daily.     No current facility-administered medications on file prior to  visit.      Review of Systems  Constitutional:  Positive for fatigue. Negative for activity change, appetite change, fever and unexpected weight change.  HENT:  Negative for congestion, ear pain, rhinorrhea, sinus pressure and sore throat.   Eyes:  Negative for pain, redness and visual disturbance.  Respiratory:  Negative for cough, shortness of breath and wheezing.   Cardiovascular:  Negative for chest pain and palpitations.  Gastrointestinal:  Negative for abdominal pain, blood in stool, constipation and diarrhea.  Endocrine: Negative for polydipsia and polyuria.  Genitourinary:  Negative for dysuria, frequency and urgency.  Musculoskeletal:  Negative for arthralgias, back pain and myalgias.  Skin:   Negative for pallor and rash.  Allergic/Immunologic: Negative for environmental allergies.  Neurological:  Negative for dizziness, syncope and headaches.  Hematological:  Negative for adenopathy. Does not bruise/bleed easily.  Psychiatric/Behavioral:  Negative for decreased concentration and dysphoric mood. The patient is not nervous/anxious.        Depression and anxiety are stable      Objective:   Physical Exam Constitutional:      General: She is not in acute distress.    Appearance: Normal appearance. She is well-developed. She is obese. She is not ill-appearing or diaphoretic.  HENT:     Head: Normocephalic and atraumatic.     Right Ear: Tympanic membrane, ear canal and external ear normal.     Left Ear: Tympanic membrane, ear canal and external ear normal.     Nose: Nose normal. No congestion.     Mouth/Throat:     Mouth: Mucous membranes are moist.     Pharynx: Oropharynx is clear. No posterior oropharyngeal erythema.  Eyes:     General: No scleral icterus.    Extraocular Movements: Extraocular movements intact.     Conjunctiva/sclera: Conjunctivae normal.     Pupils: Pupils are equal, round, and reactive to light.  Neck:     Thyroid: No thyromegaly.     Vascular: No carotid bruit or JVD.  Cardiovascular:     Rate and Rhythm: Normal rate and regular rhythm.     Pulses: Normal pulses.     Heart sounds: Normal heart sounds.    No gallop.  Pulmonary:     Effort: Pulmonary effort is normal. No respiratory distress.     Breath sounds: Normal breath sounds. No wheezing.     Comments: Good air exch Chest:     Chest wall: No tenderness.  Abdominal:     General: Bowel sounds are normal. There is no distension or abdominal bruit.     Palpations: Abdomen is soft. There is no mass.     Tenderness: There is no abdominal tenderness.     Hernia: No hernia is present.  Genitourinary:    Comments: Breast exam: No mass, nodules, thickening, tenderness, bulging, retraction,  inflamation, nipple discharge or skin changes noted.  No axillary or clavicular LA.     Musculoskeletal:        General: No tenderness. Normal range of motion.     Cervical back: Normal range of motion and neck supple. No rigidity. No muscular tenderness.     Right lower leg: No edema.     Left lower leg: No edema.     Comments: No kyphosis   Lymphadenopathy:     Cervical: No cervical adenopathy.  Skin:    General: Skin is warm and dry.     Coloration: Skin is not pale.     Findings: No erythema or rash.  Comments: Solar lentigines diffusely Few sks Few skin tags on neck  No cysts noted   Neurological:     Mental Status: She is alert. Mental status is at baseline.     Cranial Nerves: No cranial nerve deficit.     Motor: No abnormal muscle tone.     Coordination: Coordination normal.     Gait: Gait normal.     Deep Tendon Reflexes: Reflexes are normal and symmetric. Reflexes normal.  Psychiatric:        Mood and Affect: Mood normal.        Cognition and Memory: Cognition and memory normal.          Assessment & Plan:   Problem List Items Addressed This Visit       Endocrine   Hypothyroidism    No clinical changes  Takes levothyroxine 125 mcg daily  TSH drawn      Diabetes type 2, controlled ([place])    Lab Results  Component Value Date   HGBA1C 7.1 (A) 04/09/2020  Taking  Metformin 500 mg bid (renal doctor aware) Glipizide xl 5 mg daily  On ace and statin  Sent for last eye exam (jan per pt) Diet is some better  Enc more exercise  a1c pending today       Hyperlipidemia associated with type 2 diabetes mellitus ([place])    Disc goals for lipids and reasons to control them Rev last labs with pt Rev low sat fat diet in detail Labs drawn Expect stability or improvement with atorvastatin 10 mg daily and diet        Genitourinary   Chronic kidney disease, stage 3b ([place])    Sees nephrology  Last visit- improved  Enc good water intake Nephrology aware she  takes metformin  Aim for good DM control bp is good        Other   Depression with anxiety    Under psychiatric care  Good PHQ Looking forward to starting a new job      B12 deficiency    B12 shots every 3 months Also oral supplementation  Helps fatigue  Level today  Shot given after labs  Stressed imp of balanced diet      Routine general medical examination at a health care facility - Primary    Reviewed health habits including diet and exercise and skin cancer prevention Reviewed appropriate screening tests for age  Also reviewed health mt list, fam hx and immunization status , as well as social and family history   See HPI Labs drawn Interested in zoster and will check on coverage covid immunized  Flu shot given today  Sent for last mammo and pap reports from gyn  utd with colonoscopy in setting of personal h/o colon cancer      Relevant Orders   Flu Vaccine QUAD 6+ mos PF IM (Fluarix Quad PF) (Completed)   Obesity    Discussed how this problem influences overall health and the risks it imposes  Reviewed plan for weight loss with lower calorie diet (via better food choices and also portion control or program like weight watchers) and exercise building up to or more than 30 minutes 5 days per week including some aerobic activity   Per pt- has lost wt since gaining more and feels good about that  Commended change in diet and exercise      Anemia    In the setting of chonic renal dz and also iron deficiency Cbc and iron drawn  today   Remote h/o GI bleed Takes 325 mg ferrous sulfate daily      Vitamin D deficiency    Level today  Takes 5000 iu daily  Discussed imp to bone and overall health      Fatigue    Ongoing and likely multifactorial  Labs drawn  Disc imp of health habits       Other Visit Diagnoses     Need for influenza vaccination       Relevant Orders   Flu Vaccine QUAD 6+ mos PF IM (Fluarix Quad PF) (Completed)

## 2020-11-02 NOTE — Assessment & Plan Note (Signed)
Disc goals for lipids and reasons to control them Rev last labs with pt Rev low sat fat diet in detail Labs drawn Expect stability or improvement with atorvastatin 10 mg daily and diet

## 2020-11-02 NOTE — Assessment & Plan Note (Addendum)
Reviewed health habits including diet and exercise and skin cancer prevention Reviewed appropriate screening tests for age  Also reviewed health mt list, fam hx and immunization status , as well as social and family history   See HPI Labs drawn Interested in zoster and will check on coverage covid immunized  Flu shot given today  Sent for last mammo and pap reports from gyn  utd with colonoscopy in setting of personal h/o colon cancer

## 2020-11-02 NOTE — Assessment & Plan Note (Signed)
Lab Results  Component Value Date   HGBA1C 7.1 (A) 04/09/2020   Taking  Metformin 500 mg bid (renal doctor aware) Glipizide xl 5 mg daily  On ace and statin  Sent for last eye exam (jan per pt) Diet is some better  Enc more exercise  a1c pending today

## 2020-11-02 NOTE — Assessment & Plan Note (Signed)
Ongoing and likely multifactorial  Labs drawn  Disc imp of health habits

## 2020-11-02 NOTE — Assessment & Plan Note (Signed)
No clinical changes  Takes levothyroxine 125 mcg daily  TSH drawn

## 2020-11-02 NOTE — Assessment & Plan Note (Signed)
Level today  Takes 5000 iu daily  Discussed imp to bone and overall health

## 2020-11-02 NOTE — Assessment & Plan Note (Signed)
Sees nephrology  Last visit- improved  Enc good water intake Nephrology aware she takes metformin  Aim for good DM control bp is good

## 2020-11-02 NOTE — Assessment & Plan Note (Signed)
Discussed how this problem influences overall health and the risks it imposes  Reviewed plan for weight loss with lower calorie diet (via better food choices and also portion control or program like weight watchers) and exercise building up to or more than 30 minutes 5 days per week including some aerobic activity   Per pt- has lost wt since gaining more and feels good about that  Commended change in diet and exercise

## 2020-11-02 NOTE — Assessment & Plan Note (Signed)
In the setting of chonic renal dz and also iron deficiency Cbc and iron drawn today   Remote h/o GI bleed Takes 325 mg ferrous sulfate daily

## 2020-11-02 NOTE — Assessment & Plan Note (Signed)
B12 shots every 3 months Also oral supplementation  Helps fatigue  Level today  Shot given after labs  Stressed imp of balanced diet

## 2020-11-02 NOTE — Assessment & Plan Note (Signed)
Under psychiatric care  Good PHQ Looking forward to starting a new job

## 2020-11-02 NOTE — Patient Instructions (Addendum)
If you are interested in the shingles vaccine series (Shingrix), call your insurance or pharmacy to check on coverage and location it must be given.  If affordable - you can schedule it here or at your pharmacy depending on coverage   Labs today  Flu shot today  B12 shot today   Don't forget to schedule your mammogram and gyn visit   Get some exercise if you can   Try to get most of your carbohydrates from produce (with the exception of white potatoes)  Eat less bread/pasta/rice/snack foods/cereals/sweets and other items from the middle of the grocery store (processed carbs)

## 2020-11-03 LAB — CBC WITH DIFFERENTIAL/PLATELET
Basophils Absolute: 0.1 10*3/uL (ref 0.0–0.1)
Basophils Relative: 0.8 % (ref 0.0–3.0)
Eosinophils Absolute: 0.1 10*3/uL (ref 0.0–0.7)
Eosinophils Relative: 1.4 % (ref 0.0–5.0)
HCT: 40.1 % (ref 36.0–46.0)
Hemoglobin: 13 g/dL (ref 12.0–15.0)
Lymphocytes Relative: 23.9 % (ref 12.0–46.0)
Lymphs Abs: 2.2 10*3/uL (ref 0.7–4.0)
MCHC: 32.5 g/dL (ref 30.0–36.0)
MCV: 97.2 fl (ref 78.0–100.0)
Monocytes Absolute: 0.4 10*3/uL (ref 0.1–1.0)
Monocytes Relative: 4.8 % (ref 3.0–12.0)
Neutro Abs: 6.4 10*3/uL (ref 1.4–7.7)
Neutrophils Relative %: 69.1 % (ref 43.0–77.0)
Platelets: 325 10*3/uL (ref 150.0–400.0)
RBC: 4.12 Mil/uL (ref 3.87–5.11)
RDW: 14.9 % (ref 11.5–15.5)
WBC: 9.3 10*3/uL (ref 4.0–10.5)

## 2020-11-03 LAB — COMPREHENSIVE METABOLIC PANEL
ALT: 14 U/L (ref 0–35)
AST: 18 U/L (ref 0–37)
Albumin: 4.2 g/dL (ref 3.5–5.2)
Alkaline Phosphatase: 76 U/L (ref 39–117)
BUN: 31 mg/dL — ABNORMAL HIGH (ref 6–23)
CO2: 23 mEq/L (ref 19–32)
Calcium: 10 mg/dL (ref 8.4–10.5)
Chloride: 106 mEq/L (ref 96–112)
Creatinine, Ser: 1.53 mg/dL — ABNORMAL HIGH (ref 0.40–1.20)
GFR: 37.44 mL/min — ABNORMAL LOW (ref >60.00)
Glucose, Bld: 114 mg/dL — ABNORMAL HIGH (ref 70–99)
Potassium: 4.6 mEq/L (ref 3.5–5.1)
Sodium: 139 mEq/L (ref 135–145)
Total Bilirubin: 0.8 mg/dL (ref 0.2–1.2)
Total Protein: 7.2 g/dL (ref 6.0–8.3)

## 2020-11-03 LAB — LIPID PANEL
Cholesterol: 196 mg/dL (ref 0–200)
HDL: 42.2 mg/dL (ref >39.00)
NonHDL: 154.12
Total CHOL/HDL Ratio: 5
Triglycerides: 290 mg/dL — ABNORMAL HIGH (ref 0.0–149.0)
VLDL: 58 mg/dL — ABNORMAL HIGH (ref 0.0–40.0)

## 2020-11-03 LAB — VITAMIN D 25 HYDROXY (VIT D DEFICIENCY, FRACTURES): VITD: 62.17 ng/mL (ref 30.00–100.00)

## 2020-11-03 LAB — VITAMIN B12: Vitamin B-12: >1550 pg/mL — ABNORMAL HIGH (ref 211–911)

## 2020-11-03 LAB — IRON: Iron: 116 ug/dL (ref 42–145)

## 2020-11-03 LAB — HEMOGLOBIN A1C: Hgb A1c MFr Bld: 7.8 % — ABNORMAL HIGH (ref 4.6–6.5)

## 2020-11-03 LAB — LDL CHOLESTEROL, DIRECT: Direct LDL: 117 mg/dL

## 2020-11-03 LAB — TSH: TSH: 0.49 u[IU]/mL (ref 0.35–5.50)

## 2020-11-08 ENCOUNTER — Encounter: Payer: Self-pay | Admitting: *Deleted

## 2020-12-06 ENCOUNTER — Ambulatory Visit: Payer: Managed Care, Other (non HMO) | Attending: Gastroenterology | Admitting: Physical Therapy

## 2020-12-06 ENCOUNTER — Other Ambulatory Visit: Payer: Self-pay

## 2020-12-06 ENCOUNTER — Encounter: Payer: Self-pay | Admitting: Physical Therapy

## 2020-12-06 DIAGNOSIS — R278 Other lack of coordination: Secondary | ICD-10-CM | POA: Diagnosis present

## 2020-12-06 DIAGNOSIS — R159 Full incontinence of feces: Secondary | ICD-10-CM | POA: Diagnosis present

## 2020-12-06 DIAGNOSIS — M6281 Muscle weakness (generalized): Secondary | ICD-10-CM

## 2020-12-06 NOTE — Patient Instructions (Signed)
Access Code: [email or web address] URL: [email or web address] Date: 12/06/2020 Prepared by: Earlie Counts  Exercises Supine Hamstring Stretch with Strap - 1 x daily - 3 x weekly - 1 sets - 2 reps - 30 sec hold Supine ITB Stretch with Strap - 1 x daily - 3 x weekly - 1 sets - 2 reps - 30 sec hold Supine Figure 4 Piriformis Stretch - 1 x daily - 3 x weekly - 1 sets - 2 reps - 30 sec hold Supine Pelvic Floor Stretch - 1 x daily - 3 x weekly - 1 sets - 1 reps - 30 sec hold Supine Piriformis Stretch with Leg Straight - 1 x daily - 3 x weekly - 1 sets - 2 reps - 30 sec hold Supine Diaphragmatic Breathing - 2 x daily - 3 x weekly - 1 sets - 10 reps Sidelying Reverse Clamshell - 1 x daily - 3 x weekly - 1 sets - 10 reps Supine Pelvic Floor Contraction - 2 x daily - 7 x weekly - 1 sets - 10 reps - 5 sec hold Beginner Bridge - 1 x daily - 3 x weekly - 1 sets - 10 reps Prone Hip Extension with Pillow Under Abdomen - 1 x daily - 3 x weekly - 1 sets - 10 reps Standing Pelvic Floor Contraction - 4 x daily - 7 x weekly - 5 reps - 5 sec hold  [facility] [address], [place], [place] [address] Phone # [phone] Fax [phone]

## 2020-12-06 NOTE — Therapy (Signed)
[facility] @ [place], [place], [address] Phone: [phone]   Fax:  [phone]  Physical Therapy Treatment  Patient Details  Name: Kelli Cruz MRN: [id number] Date of Birth: May 07, 1960 Referring Provider (PT): Dr. Thornton Park   Encounter Date: 12/06/2020   PT End of Session - 12/06/20 1700     Visit Number 5    Date for PT Re-Evaluation 02/20/21    Authorization Type cigna    Authorization Time Period 1/22-12/31/22    PT Start Time 1615    PT Stop Time 1655    PT Time Calculation (min) 40 min    Activity Tolerance Patient tolerated treatment well;No increased pain    Behavior During Therapy WFL for tasks assessed/performed             Past Medical History:  Diagnosis Date   Anemia    Anxiety    Arthritis    Bipolar 1 disorder ([place])    Cancer ([place])    sigmoid colon cancer   Depression    Diabetes mellitus    type II   Endometriosis    Family history of malignant neoplasm of gastrointestinal tract    Ganglion cyst    GERD (gastroesophageal reflux disease)    Hyperlipidemia    Hypothyroidism    Insomnia due to mental condition     Past Surgical History:  Procedure Laterality Date   ABDOMINAL HYSTERECTOMY     ABDOMINAL HYSTERECTOMY     BIOPSY  12/31/2018   Procedure: BIOPSY;  Surgeon: Doran Stabler, MD;  Location: [facility] ENDOSCOPY;  Service: Gastroenterology;;   COLON RESECTION  June 2010   [place] AND CURETTAGE OF UTERUS  2006   miscarriage    ESOPHAGOGASTRODUODENOSCOPY N/A 12/31/2018   Procedure: ESOPHAGOGASTRODUODENOSCOPY (EGD);  Surgeon: Doran Stabler, MD;  Location: [facility];  Service: Gastroenterology;  Laterality: N/A;   GANGLION CYST EXCISION     HERNIA REPAIR     INTERSTIM IMPLANT PLACEMENT Left 12/11/2012   stimulator is on the left but the electrodes go to the right   [place]    D & C for endometriosis    There were no vitals filed for this visit.   Subjective Assessment - 12/06/20 1625     Subjective Not able to get in with fo 2 months due to therapist schedule. I am doing better. I have only lost stool 1 time since last visit. Patient lost her stool one month ago. When she wipes there is a little there. I  have gotten a job.    Patient Stated Goals be able to get to the bathroom before she poops in her pants    Currently in Pain? No/denies    Multiple Pain Sites No                [facility] PT Assessment - 12/06/20 0001       Assessment   Medical Diagnosis M62.89 Pelvic floor Dysfunction    Referring Provider (PT) Dr. Thornton Park    Prior Therapy none      Precautions   Precautions Other (comment)    Precaution Comments sigmoid cancer, Interstim implant      Restrictions   Weight Bearing Restrictions No      Prior Function   Level of Independence Independent      Cognition  Overall Cognitive Status Within Functional Limits for tasks assessed      ROM / Strength   AROM / PROM / Strength AROM;PROM;Strength      PROM   Right Hip Flexion 115    Right Hip External Rotation  65    Left Hip Flexion 120    Left Hip External Rotation  65    Lumbar Extension decreased by 25%    Lumbar - Right Side Bend decreased by 25%    Lumbar - Left Side Bend decreased by 25%    Lumbar - Right Rotation decreased by 25%      Strength   Right Hip Extension 3-/5    Right Hip ABduction 4/5    Left Hip Extension 3-/5    Left Hip ABduction 3/5      Lumbar    Lumbar - Left Rotation decreased by 25%                        Pelvic Floor Special Questions - 12/06/20 0001     Currently Sexually Active Yes    Is this Painful Yes   always had pain   Urinary Leakage Yes    Pad use depends all day, wipes with toilet paper    Activities that cause leaking With strong urge;Walking    Fecal incontinence Yes    Strength fair squeeze, definite  lift   vaginally              [facility] Adult PT Treatment/Exercise - 12/06/20 0001       Lumbar Exercises: Standing   Other Standing Lumbar Exercises standing pelvic floor contraction holding for 5 sec. 5 times      Lumbar Exercises: Supine   Bridge 15 reps;1 second    Bridge Limitations changing position of feet to reduce cramp in the hamstring      Lumbar Exercises: Sidelying   Clam Left;10 reps;1 second    Clam Limitations reverse clam      Knee/Hip Exercises: Prone   Hip Extension Strengthening;Right;Left;2 sets;10 reps    Hip Extension Limitations at first needed Assistance than afterwards was able to do on her own      Manual Therapy   Manual Therapy Joint mobilization    Joint Mobilization anterior mobilization of the hip to improve extension                     PT Education - 12/06/20 1700     Education Details Access Code: [email or web address]; discussed with patient on working on a walking program    Person(s) Educated Patient    Methods Explanation;Demonstration;Handout    Comprehension Verbalized understanding;Returned demonstration              PT Short Term Goals - 12/06/20 1709       PT SHORT TERM GOAL #1   Title independent with initial HEP    Time 4    Period Weeks    Status Achieved      PT SHORT TERM GOAL #2   Title able to bulge the rectum instead of contract when she has a bowel movement    Time 4    Period Weeks    Status On-going      PT SHORT TERM GOAL #3   Title understand how to care for the rectal and vaginal tissue to reduce the redness    Time 4    Period Weeks    Status Achieved  PT Long Term Goals - 12/06/20 1628       PT LONG TERM GOAL #1   Title independent with advanced HEP for pelvic floor coordination and strength    Time 4    Period Months    Status On-going      PT LONG TERM GOAL #2   Title able to contract the vaginal with strength >/3/5 to reduce her urinary leakage >/= 50%    Baseline  urinary leakage is 20% better.    Time 4    Period Months    Status On-going      PT LONG TERM GOAL #3   Title able to contract the anal sphincter with strength >/= 3/5 to stop fecal leakage as she is walking to the bathroom    Time 4    Period Months    Status On-going      PT LONG TERM GOAL #4   Title reduction of pelvic floor muscles trigger points so she is able to have a vaginal exam and the pelvic floor mucsles can contract correctly    Time 4    Period Months    Status On-going                   Plan - 12/06/20 1702     Clinical Impression Statement Patient has not been seen in 2 months due to the therapist schedule. She reports she has only had 1 stool leakage in the past 2 months. She has to wipe several times due to smearing of the stool. She will leak urine with walking at work and when she has the urge.Patient has not been consistent with her home exercise program. She continues to have weakness in her hips. She was not able to extend her hips in prone initially today. After manual mobilization of the hips and engagement of the gluteal she was able to extend in a prone position. Patient is using moisturizers on the vaginal area and is helping. Patient will benefit from skilled therapy to improve pelvic floor coordination and tissue mobility, reduce pelvic pain and leakage.    Personal Factors and Comorbidities Comorbidity 3+;Sex;Past/Current Experience;Fitness;Age    Comorbidities s/p Sigmoid cancer with colon resection; Abdomial hysterectomy; Endometriosis; Hernia repair; Interstim implant placement; Hypothyroidism; Bipolar;    Examination-Activity Limitations Continence;Toileting;Stairs;Stand;Hygiene/Grooming;Lift;Squat;Bend;Locomotion Level;Transfers    Examination-Participation Restrictions Interpersonal Relationship;Community Activity;Occupation    Stability/Clinical Decision Making Evolving/Moderate complexity    Rehab Potential Good    PT Frequency 1x / week     PT Duration Other (comment)   4 months   PT Treatment/Interventions ADLs/Self Care Home Management;Biofeedback;Electrical Stimulation;Cryotherapy;Moist Heat;Neuromuscular re-education;Therapeutic exercise;Therapeutic activities;Functional mobility training;Patient/family education;Manual techniques;Dry needling;Spinal Manipulations;Joint Manipulations    PT Next Visit Plan continue with pelvic floor strength in standing, standing with pallof and hip abduction, work on hip extension in standing    PT Home Exercise Plan Access Code: [email or web address]    Consulted and Agree with Plan of Care Patient             Patient will benefit from skilled therapeutic intervention in order to improve the following deficits and impairments:  Decreased coordination, Decreased range of motion, Increased fascial restricitons, Decreased endurance, Increased muscle spasms, Decreased activity tolerance, Decreased skin integrity, Pain, Decreased mobility, Decreased strength  Visit Diagnosis: Muscle weakness (generalized)  Other lack of coordination  Incontinence of feces, unspecified fecal incontinence type     Problem List Patient Active Problem List   Diagnosis Date Noted   Loose stools 04/10/2020  Fatigue 09/12/2019   Leg cramps 08/15/2019   Plantar wart of right foot 08/15/2019   Chronic kidney disease, stage 3b ([place]) 02/03/2019   History of GI bleed 12/29/2018   Anemia 02/19/2017   Vitamin D deficiency 02/19/2017   Knee pain, right 11/10/2016   Proteinuria 02/18/2016   Exposure to communicable disease 08/04/2015   Obesity 02/04/2015   Hypersomnia with sleep apnea 01/06/2015   Insomnia due to mental condition    Tingling 08/12/2014   Burning sensation of mouth 08/12/2014   Numbness 10/24/2011   Routine general medical examination at a health care facility 10/24/2011   TMJ arthralgia 08/14/2011   Poor posture 07/26/2011   B12 deficiency 04/13/2010   ADENOCARCINOMA, SIGMOID COLON 08/11/2008    Hyperlipidemia associated with type 2 diabetes mellitus ([place]) 07/08/2008   Hypothyroidism 09/12/2006   Diabetes type 2, controlled ([place]) 09/12/2006   Depression with anxiety 09/12/2006   ALLERGIC RHINITIS, SEASONAL 09/12/2006   FIBROCYSTIC BREAST DISEASE 09/12/2006   MIGRAINES, HX OF 09/12/2006    Earlie Counts, PT 12/06/20 5:10 PM  [facility] @ [place], [place], [address] Phone: [phone]   Fax:  [phone]  Name: Kelli Cruz MRN: [id number] Date of Birth: 09/02/1960

## 2020-12-20 ENCOUNTER — Encounter: Payer: Self-pay | Admitting: Physical Therapy

## 2020-12-20 ENCOUNTER — Ambulatory Visit: Payer: 59 | Attending: Gastroenterology | Admitting: Physical Therapy

## 2020-12-20 ENCOUNTER — Other Ambulatory Visit: Payer: Self-pay

## 2020-12-20 DIAGNOSIS — R278 Other lack of coordination: Secondary | ICD-10-CM | POA: Diagnosis present

## 2020-12-20 DIAGNOSIS — R159 Full incontinence of feces: Secondary | ICD-10-CM | POA: Insufficient documentation

## 2020-12-20 DIAGNOSIS — M6281 Muscle weakness (generalized): Secondary | ICD-10-CM | POA: Diagnosis present

## 2020-12-20 NOTE — Therapy (Signed)
[facility] @ [place], [place], [address] Phone: [phone]   Fax:  [phone]  Physical Therapy Treatment  Patient Details  Name: Kelli Cruz MRN: [id number] Date of Birth: 1960/04/12 Referring Provider (PT): Dr. Thornton Park   Encounter Date: 12/20/2020   PT End of Session - 12/20/20 1652     Visit Number 6    Authorization Type cigna    Authorization Time Period 1/22-12/31/22    PT Start Time 7867    PT Stop Time 6720    PT Time Calculation (min) 38 min    Activity Tolerance Patient tolerated treatment well;No increased pain    Behavior During Therapy WFL for tasks assessed/performed             Past Medical History:  Diagnosis Date   Anemia    Anxiety    Arthritis    Bipolar 1 disorder ([place])    Cancer ([place])    sigmoid colon cancer   Depression    Diabetes mellitus    type II   Endometriosis    Family history of malignant neoplasm of gastrointestinal tract    Ganglion cyst    GERD (gastroesophageal reflux disease)    Hyperlipidemia    Hypothyroidism    Insomnia due to mental condition     Past Surgical History:  Procedure Laterality Date   ABDOMINAL HYSTERECTOMY     ABDOMINAL HYSTERECTOMY     BIOPSY  12/31/2018   Procedure: BIOPSY;  Surgeon: Doran Stabler, MD;  Location: [facility] ENDOSCOPY;  Service: Gastroenterology;;   COLON RESECTION  June 2010   [place] AND CURETTAGE OF UTERUS  2006   miscarriage    ESOPHAGOGASTRODUODENOSCOPY N/A 12/31/2018   Procedure: ESOPHAGOGASTRODUODENOSCOPY (EGD);  Surgeon: Doran Stabler, MD;  Location: [facility];  Service: Gastroenterology;  Laterality: N/A;   GANGLION CYST EXCISION     HERNIA REPAIR     INTERSTIM IMPLANT PLACEMENT Left 12/11/2012   stimulator is on the left but the electrodes go to the right   [place]   D & C for endometriosis    There were  no vitals filed for this visit.   Subjective Assessment - 12/20/20 1624     Subjective I have not had any fecal or urinary leakage since last visit.    Patient Stated Goals be able to get to the bathroom before she poops in her pants    Currently in Pain? No/denies    Multiple Pain Sites No                               [facility] Adult PT Treatment/Exercise - 12/20/20 0001       Exercises   Exercises Other Exercises    Other Exercises  sitting pelvic floor contraction holding for 5 sec x5 then followed by 5 One second contractions      Lumbar Exercises: Stretches   Active Hamstring Stretch Right;Left;1 rep;30 seconds    Active Hamstring Stretch Limitations sitting    Single Knee to Chest Stretch Right;Left;30 seconds    Piriformis Stretch Left;Right;1 rep;30 seconds    Piriformis Stretch Limitations supine push knee down    Other Lumbar Stretch Exercise manually stretched bilateral hips in all directions to indirectly stretch the pelvic floor  Lumbar Exercises: Standing   Other Standing Lumbar Exercises standing hip extension 10x each side with pelvic floor contraction; standing hip abduction 10x each with pelvic floor contraction   both contract the gluteals                    PT Education - 12/20/20 1641     Education Details Access Code: [email or web address]    Person(s) Educated Patient    Methods Explanation;Demonstration;Verbal cues;Handout    Comprehension Returned demonstration;Verbalized understanding              PT Short Term Goals - 12/20/20 1655       PT SHORT TERM GOAL #1   Title independent with initial HEP    Time 4    Period Weeks    Status Achieved      PT SHORT TERM GOAL #2   Title able to bulge the rectum instead of contract when she has a bowel movement    Time 4    Period Weeks    Status Achieved      PT SHORT TERM GOAL #3   Title understand how to care for the rectal and vaginal tissue to reduce the redness    Time  4    Period Weeks    Status Achieved               PT Long Term Goals - 12/20/20 1655       PT LONG TERM GOAL #1   Title independent with advanced HEP for pelvic floor coordination and strength    Time 4    Period Months    Status On-going      PT LONG TERM GOAL #2   Title able to contract the vaginal with strength >/3/5 to reduce her urinary leakage >/= 50%    Baseline urinary leakage is 20% better.    Time 4    Period Months    Status On-going      PT LONG TERM GOAL #3   Title able to contract the anal sphincter with strength >/= 3/5 to stop fecal leakage as she is walking to the bathroom    Time 4    Period Months    Status On-going      PT LONG TERM GOAL #4   Title reduction of pelvic floor muscles trigger points so she is able to have a vaginal exam and the pelvic floor mucsles can contract correctly    Time 4    Period Months    Status Achieved                   Plan - 12/20/20 1652     Clinical Impression Statement Patient is not having fecal or urinary leakage. She has trouble fitting in all of her exercises. Today we reviewed her exercises to make difficult ones easier to do. Patient is happy with her new program. She is able to extend her hips better in standing than prone. Patient will benefit from skilled therapy to improve pelvic floor cordination and tissue mobility, reduce pelvic pain and leakage.    Personal Factors and Comorbidities Comorbidity 3+;Sex;Past/Current Experience;Fitness;Age    Comorbidities s/p Sigmoid cancer with colon resection; Abdomial hysterectomy; Endometriosis; Hernia repair; Interstim implant placement; Hypothyroidism; Bipolar;    Examination-Activity Limitations Continence;Toileting;Stairs;Stand;Hygiene/Grooming;Lift;Squat;Bend;Locomotion Level;Transfers    Examination-Participation Restrictions Interpersonal Relationship;Community Activity;Occupation    Stability/Clinical Decision Making Evolving/Moderate complexity     Rehab Potential Good    PT Frequency 1x /  week    PT Duration Other (comment)   4 months   PT Treatment/Interventions ADLs/Self Care Home Management;Biofeedback;Electrical Stimulation;Cryotherapy;Moist Heat;Neuromuscular re-education;Therapeutic exercise;Therapeutic activities;Functional mobility training;Patient/family education;Manual techniques;Dry needling;Spinal Manipulations;Joint Manipulations    PT Next Visit Plan review HEP and possible discharge    PT Home Exercise Plan Access Code: [email or web address]    Consulted and Agree with Plan of Care Patient             Patient will benefit from skilled therapeutic intervention in order to improve the following deficits and impairments:  Decreased coordination, Decreased range of motion, Increased fascial restricitons, Decreased endurance, Increased muscle spasms, Decreased activity tolerance, Decreased skin integrity, Pain, Decreased mobility, Decreased strength  Visit Diagnosis: Muscle weakness (generalized)  Other lack of coordination  Incontinence of feces, unspecified fecal incontinence type     Problem List Patient Active Problem List   Diagnosis Date Noted   Loose stools 04/10/2020   Fatigue 09/12/2019   Leg cramps 08/15/2019   Plantar wart of right foot 08/15/2019   Chronic kidney disease, stage 3b ([place]) 02/03/2019   History of GI bleed 12/29/2018   Anemia 02/19/2017   Vitamin D deficiency 02/19/2017   Knee pain, right 11/10/2016   Proteinuria 02/18/2016   Exposure to communicable disease 08/04/2015   Obesity 02/04/2015   Hypersomnia with sleep apnea 01/06/2015   Insomnia due to mental condition    Tingling 08/12/2014   Burning sensation of mouth 08/12/2014   Numbness 10/24/2011   Routine general medical examination at a health care facility 10/24/2011   TMJ arthralgia 08/14/2011   Poor posture 07/26/2011   B12 deficiency 04/13/2010   ADENOCARCINOMA, SIGMOID COLON 08/11/2008   Hyperlipidemia associated with type 2  diabetes mellitus ([facility]) 07/08/2008   Hypothyroidism 09/12/2006   Diabetes type 2, controlled ([place]) 09/12/2006   Depression with anxiety 09/12/2006   ALLERGIC RHINITIS, SEASONAL 09/12/2006   FIBROCYSTIC BREAST DISEASE 09/12/2006   MIGRAINES, HX OF 09/12/2006    Earlie Counts, PT 12/20/20 4:57 PM  [facility] @ [place], [place], [address] Phone: [phone]   Fax:  [phone]  Name: Kelli Cruz MRN: [id number] Date of Birth: 1960/10/03

## 2020-12-20 NOTE — Patient Instructions (Signed)
Access Code: [email or web address] URL: [email or web address] Date: 12/20/2020 Prepared by: Earlie Counts  Exercises Seated Hamstring Stretch - 1 x daily - 3 x weekly - 1 sets - 2 reps - 30 sec hold Supine Figure 4 Piriformis Stretch - 1 x daily - 3 x weekly - 1 sets - 2 reps - 30 sec hold Supine Piriformis Stretch with Leg Straight - 1 x daily - 3 x weekly - 1 sets - 2 reps - 30 sec hold Supine Diaphragmatic Breathing - 2 x daily - 3 x weekly - 1 sets - 10 reps Sidelying Reverse Clamshell - 1 x daily - 3 x weekly - 1 sets - 10 reps Beginner Bridge - 1 x daily - 3 x weekly - 1 sets - 10 reps Standing Pelvic Floor Contraction - 4 x daily - 7 x weekly - 5 reps - 5 sec hold Seated Pelvic Floor Contraction - 2 x daily - 7 x weekly - 1 sets - 5 reps - 10 sec hold Standing Hip Abduction - 1 x daily - 3 x weekly - 2 sets - 10 reps Standing Hip Extension - 1 x daily - 3 x weekly - 2 sets - 10 reps [facility] [address] [place], [place] [address] Phone # [phone] Fax [phone]

## 2021-01-12 ENCOUNTER — Encounter: Payer: 59 | Admitting: Physical Therapy

## 2021-01-24 ENCOUNTER — Encounter: Payer: Self-pay | Admitting: Physical Therapy

## 2021-01-24 ENCOUNTER — Other Ambulatory Visit: Payer: Self-pay

## 2021-01-24 DIAGNOSIS — K219 Gastro-esophageal reflux disease without esophagitis: Secondary | ICD-10-CM

## 2021-01-24 MED ORDER — PANTOPRAZOLE SODIUM 40 MG PO TBEC: 40.0000 mg | DELAYED_RELEASE_TABLET | Freq: Every morning | ORAL | 0 refills | Status: DC

## 2021-01-24 NOTE — Progress Notes (Signed)
Refill request rec'd for pantoprazole 40 mg once daily. Last seen 05-2020. "F/u as needed"

## 2021-02-09 ENCOUNTER — Other Ambulatory Visit: Payer: Self-pay

## 2021-02-09 ENCOUNTER — Encounter: Payer: Self-pay | Admitting: Physical Therapy

## 2021-02-09 ENCOUNTER — Ambulatory Visit: Payer: 59 | Attending: Gastroenterology | Admitting: Physical Therapy

## 2021-02-09 DIAGNOSIS — M6281 Muscle weakness (generalized): Secondary | ICD-10-CM | POA: Diagnosis not present

## 2021-02-09 DIAGNOSIS — R159 Full incontinence of feces: Secondary | ICD-10-CM | POA: Insufficient documentation

## 2021-02-09 DIAGNOSIS — R278 Other lack of coordination: Secondary | ICD-10-CM | POA: Insufficient documentation

## 2021-02-09 NOTE — Patient Instructions (Signed)
Access Code: [email or web address] URL: [email or web address] Date: 02/09/2021 Prepared by: Earlie Counts  Exercises Seated Hamstring Stretch - 1 x daily - 3 x weekly - 1 sets - 2 reps - 30 sec hold Supine Piriformis Stretch with Leg Straight - 1 x daily - 3 x weekly - 1 sets - 2 reps - 30 sec hold Beginner Bridge - 1 x daily - 3 x weekly - 1 sets - 10 reps Standing Pelvic Floor Contraction - 4 x daily - 7 x weekly - 5 reps - 5 sec hold Seated Pelvic Floor Contraction - 2 x daily - 7 x weekly - 1 sets - 5 reps - 10 sec hold Standing 3-Way Leg Reach with Resistance at Ankles and Counter Support - 1 x daily - 2 x weekly - 3 sets - 10 reps [facility] [address], [place], [place] [address] Phone # [phone] Fax [phone]

## 2021-02-09 NOTE — Therapy (Signed)
[facility] @ [facility] [place], [place], [address] Phone: [phone]   Fax:  [phone]  Physical Therapy Treatment  Patient Details  Name: Kelli Cruz MRN: [id number] Date of Birth: [birth year] Referring Provider (PT): Dr. Thornton Park   Encounter Date: 02/09/2021   PT End of Session - 02/09/21 1657     Visit Number 7    Date for PT Re-Evaluation 02/20/21    Authorization Type cigna    Authorization Time Period 1/22-12/31/22    PT Start Time 1645    PT Stop Time 7619    PT Time Calculation (min) 8 min    Activity Tolerance Patient tolerated treatment well;No increased pain    Behavior During Therapy WFL for tasks assessed/performed             Past Medical History:  Diagnosis Date   Anemia    Anxiety    Arthritis    Bipolar 1 disorder ([place])    Cancer ([place])    sigmoid colon cancer   Depression    Diabetes mellitus    type II   Endometriosis    Family history of malignant neoplasm of gastrointestinal tract    Ganglion cyst    GERD (gastroesophageal reflux disease)    Hyperlipidemia    Hypothyroidism    Insomnia due to mental condition     Past Surgical History:  Procedure Laterality Date   ABDOMINAL HYSTERECTOMY     ABDOMINAL HYSTERECTOMY     BIOPSY  12/31/2018   Procedure: BIOPSY;  Surgeon: Doran Stabler, MD;  Location: [facility] ENDOSCOPY;  Service: Gastroenterology;;   COLON RESECTION  June 2010   [place] AND CURETTAGE OF UTERUS  2006   miscarriage    ESOPHAGOGASTRODUODENOSCOPY N/A 12/31/2018   Procedure: ESOPHAGOGASTRODUODENOSCOPY (EGD);  Surgeon: Doran Stabler, MD;  Location: [facility];  Service: Gastroenterology;  Laterality: N/A;   GANGLION CYST EXCISION     HERNIA REPAIR     INTERSTIM IMPLANT PLACEMENT Left 12/11/2012   stimulator is on the left but the electrodes go to the right   [place]   D  & C for endometriosis    There were no vitals filed for this visit.   Subjective Assessment - 02/09/21 1620     Subjective I am feeling better. I have not pooped in my pants.    Patient Stated Goals be able to get to the bathroom before she poops in her pants    Currently in Pain? No/denies    Multiple Pain Sites No                [facility] PT Assessment - 02/09/21 0001       Assessment   Medical Diagnosis M62.89 Pelvic floor Dysfunction    Referring Provider (PT) Dr. Thornton Park    Prior Therapy none      Precautions   Precautions Other (comment)    Precaution Comments sigmoid cancer, Interstim implant      Restrictions   Weight Bearing Restrictions No      Prior Function   Level of Independence Independent      AROM   Right Hip Extension 5    Left Hip Extension 5      PROM   Right Hip Flexion 120    Right Hip External Rotation  65    Left  Hip Flexion 120    Left Hip External Rotation  65    Lumbar Extension full    Lumbar - Right Side Bend full    Lumbar - Left Side Bend full    Lumbar - Right Rotation full      Strength   Right Hip Extension 4/5    Right Hip ABduction 5/5    Left Hip Extension 5/5    Left Hip ABduction 5/5      Lumbar    Lumbar - Left Rotation full      Pelvic Compression   Findings Negative    Side Left    comment no pain                        Pelvic Floor Special Questions - 02/09/21 0001     Urinary Leakage Yes   has an interstim to help with that but needs to adust the interstim   Pad use depends all day    Activities that cause leaking With strong urge;Walking    Fecal incontinence No               [facility] Adult PT Treatment/Exercise - 02/09/21 0001       Self-Care   Self-Care Other Self-Care Comments    Other Self-Care Comments  discussed with patient not wearing depends pad for 30 minutes at night to air out the area, use her moisturizer for vaginal health.      Exercises   Exercises Other  Exercises    Other Exercises  sitting pelvic floor contraction holding for 5 sec x5 then followed by 5 One second contractions      Lumbar Exercises: Stretches   Active Hamstring Stretch Right;Left;1 rep;30 seconds    Active Hamstring Stretch Limitations sitting    Piriformis Stretch Left;Right;1 rep;30 seconds    Piriformis Stretch Limitations supine push knee down      Lumbar Exercises: Standing   Other Standing Lumbar Exercises vstanding 3 way reach with green band 15x each side with pelvic floor contraction      Lumbar Exercises: Supine   Bridge 15 reps;1 second                     PT Education - 02/09/21 1657     Education Details Access Code: [email or web address]    Person(s) Educated Patient    Methods Explanation;Demonstration;Handout    Comprehension Returned demonstration;Verbalized understanding              PT Short Term Goals - 02/09/21 1635       PT SHORT TERM GOAL #1   Title independent with initial HEP    Time 4    Period Weeks    Status Achieved    Target Date 08/26/20      PT SHORT TERM GOAL #2   Title able to bulge the rectum instead of contract when she has a bowel movement    Time 4    Period Weeks    Status Achieved      PT SHORT TERM GOAL #3   Title understand how to care for the rectal and vaginal tissue to reduce the redness    Time 4    Period Weeks    Status Achieved               PT Long Term Goals - 02/09/21 1636       PT LONG TERM GOAL #1   Title  independent with advanced HEP for pelvic floor coordination and strength    Time 4    Period Months    Status Achieved      PT LONG TERM GOAL #2   Title able to contract the vaginal with strength >/3/5 to reduce her urinary leakage >/= 50%    Baseline urinary leakage is 20% better.; may be having issuew with her interstim and will be having it checked out    Time 4    Period Months    Status Partially Met      PT LONG TERM GOAL #3   Title able to contract the anal  sphincter with strength >/= 3/5 to stop fecal leakage as she is walking to the bathroom    Time 4    Period Months    Status Achieved      PT LONG TERM GOAL #4   Title reduction of pelvic floor muscles trigger points so she is able to have a vaginal exam and the pelvic floor mucsles can contract correctly    Time 4    Period Months    Status Achieved                   Plan - 02/09/21 1658     Clinical Impression Statement Patient is not having fecal leakage. She is still having urinary leakage but she feels she will need to have her interstim looked at to be adjusted. Patient is doing her HEP and it was updated. She improved hip ROM and strength. She is having difficulty with irritation of her perineal area and was instructed to take 30 minutes per day to air out the area and use her vaginal moiisturizer. Patient is ready for discharge.    Personal Factors and Comorbidities Comorbidity 3+;Sex;Past/Current Experience;Fitness;Age    Comorbidities s/p Sigmoid cancer with colon resection; Abdomial hysterectomy; Endometriosis; Hernia repair; Interstim implant placement; Hypothyroidism; Bipolar;    Examination-Activity Limitations Continence;Toileting;Stairs;Stand;Hygiene/Grooming;Lift;Squat;Bend;Locomotion Level;Transfers    Examination-Participation Restrictions Interpersonal Relationship;Community Activity;Occupation    Stability/Clinical Decision Making Evolving/Moderate complexity    Rehab Potential Good    PT Treatment/Interventions ADLs/Self Care Home Management;Biofeedback;Electrical Stimulation;Cryotherapy;Moist Heat;Neuromuscular re-education;Therapeutic exercise;Therapeutic activities;Functional mobility training;Patient/family education;Manual techniques;Dry needling;Spinal Manipulations;Joint Manipulations    PT Next Visit Plan Discharge to HEP    PT Home Exercise Plan Access Code: [email or web address]    Consulted and Agree with Plan of Care Patient             Patient will  benefit from skilled therapeutic intervention in order to improve the following deficits and impairments:  Decreased coordination, Decreased range of motion, Increased fascial restricitons, Decreased endurance, Increased muscle spasms, Decreased activity tolerance, Decreased skin integrity, Pain, Decreased mobility, Decreased strength  Visit Diagnosis: Muscle weakness (generalized)  Other lack of coordination  Incontinence of feces, unspecified fecal incontinence type     Problem List Patient Active Problem List   Diagnosis Date Noted   Loose stools 04/10/2020   Fatigue 09/12/2019   Leg cramps 08/15/2019   Plantar wart of right foot 08/15/2019   Chronic kidney disease, stage 3b ([place]) 02/03/2019   History of GI bleed 12/29/2018   Anemia 02/19/2017   Vitamin D deficiency 02/19/2017   Knee pain, right 11/10/2016   Proteinuria 02/18/2016   Exposure to communicable disease 08/04/2015   Obesity 02/04/2015   Hypersomnia with sleep apnea 01/06/2015   Insomnia due to mental condition    Tingling 08/12/2014   Burning sensation of mouth 08/12/2014   Numbness 10/24/2011  Routine general medical examination at a health care facility 10/24/2011   TMJ arthralgia 08/14/2011   Poor posture 07/26/2011   B12 deficiency 04/13/2010   ADENOCARCINOMA, SIGMOID COLON 08/11/2008   Hyperlipidemia associated with type 2 diabetes mellitus ([place]) 07/08/2008   Hypothyroidism 09/12/2006   Diabetes type 2, controlled ([place]) 09/12/2006   Depression with anxiety 09/12/2006   ALLERGIC RHINITIS, SEASONAL 09/12/2006   FIBROCYSTIC BREAST DISEASE 09/12/2006   MIGRAINES, HX OF 09/12/2006    Earlie Counts, PT 02/09/21 5:03 PM  [facility] @ [place], [place], [address] Phone: [phone]   Fax:  [phone]  Name: OMAYRA TULLOCH MRN: [id number] Date of Birth: 1960-08-01 PHYSICAL THERAPY DISCHARGE SUMMARY  Visits from Start of Care:  7  Current functional level related to goals / functional outcomes: See above.    Remaining deficits: See above   Education / Equipment: HEP   Patient agrees to discharge. Patient goals were partially met. Patient is being discharged due to being pleased with the current functional level. Thank you for the referral. Earlie Counts, PT 02/09/21 5:04 PM

## 2021-02-21 ENCOUNTER — Encounter: Payer: Self-pay | Admitting: Physical Therapy

## 2021-04-02 ENCOUNTER — Other Ambulatory Visit: Payer: Self-pay | Admitting: Gastroenterology

## 2021-04-02 DIAGNOSIS — K219 Gastro-esophageal reflux disease without esophagitis: Secondary | ICD-10-CM

## 2021-05-06 ENCOUNTER — Other Ambulatory Visit: Payer: Self-pay | Admitting: Family Medicine

## 2021-05-10 ENCOUNTER — Other Ambulatory Visit: Payer: 59

## 2021-05-10 ENCOUNTER — Ambulatory Visit: Payer: 59 | Admitting: Gastroenterology

## 2021-05-10 ENCOUNTER — Encounter: Payer: Self-pay | Admitting: Gastroenterology

## 2021-05-10 VITALS — BP 118/80 | HR 78 | Ht 62.5 in | Wt 212.8 lb

## 2021-05-10 DIAGNOSIS — M6289 Other specified disorders of muscle: Secondary | ICD-10-CM | POA: Diagnosis not present

## 2021-05-10 DIAGNOSIS — K760 Fatty (change of) liver, not elsewhere classified: Secondary | ICD-10-CM

## 2021-05-10 DIAGNOSIS — K219 Gastro-esophageal reflux disease without esophagitis: Secondary | ICD-10-CM | POA: Diagnosis not present

## 2021-05-10 MED ORDER — PANTOPRAZOLE SODIUM 40 MG PO TBEC: 40.0000 mg | DELAYED_RELEASE_TABLET | Freq: Every morning | ORAL | 3 refills | Status: DC

## 2021-05-10 NOTE — Progress Notes (Signed)
? ? ?Referring Provider: Abner Greenspan, MD ?Primary Care Physician:  Tower, [name], MD ? ?Chief complaint: Follow-up ? ? ?IMPRESSION:  ?Postoperative pelvic floor dysfunction ?   - improved after completing PT with Earlie Counts ?NSAID associated PUD with associated GI bleeding 12/2018 ?   - resolution of ulcers on EGD 2021 ?Suspect slight/early fatty infiltration of the liver on ultrasound 06/04/19 ?   - normal AST/ALT ?   - NASH Fibrosure 07/22/19: F0(no fibrosis), S2-3(moderate steatosis), NASH score of N2 ?Personal history of colon cancer s/p resection 2010 ?Hyperplastic polyps on colonoscopy 10/09/2018 ?  ?Altered bowel habits with suspected pelvic floor dysfunction: She has experienced significant improvement with pelvic floor PT. Consider anorectal manometry if symptoms worsen.  ? ?Symptomatic gastritis and history of PUD: Continue to avoid all NSAIDs. Resume pantoprazole 40 mg QAM. Discussed the potential long-term risks of PPI therapy.  ?  ?Suspected NASH: Liver enzymes 9/22 remain normal.  Given concurrent diabetes, there is increased risk for disease progression. Plan NASH Fibrosure to restage. ? ?Personal history of colon cancer and polyps: Colonoscopy due 2025. Would consider an earlier study with new symptoms. ? ?PLAN: ?- Continue pantoprazole 61m QAM (3 months with 3 refills) ?- Continue to avoid NSAIDs ?- NASH FibroSURE ?- Colonoscopy 2025 given the personal history of colon cancer ?- Office follow-up at least annually, earlier as needed ? ? ?HPI: Kelli LAPREis a 61y.o. female who returns today for a medication refill. She was last seen 06/03/20.  ?  ?Presents today requesting a refill of pantoprazole. With a switch in insurance, she lost coverage. When she stopped using the medication she noted recurrent symptoms.  ? ?With pelvic floor PT she has had significant improvement in her post-operative bowel changes that had been present since her colon resection in 2010. At times, she was having 4-5  accidents every month. Although she has more control and it is not very unusual to have an accident, she continues to wear Depends for reassurance. ? ?Interstim battery replaced last week for treatment of urinary incontinence. ? ?GI ROS is otherwise negative.   ? ?Endoscopic history: ?- Personal history of colon cancer s/p resection 2010 ?- Prior colonoscopy with Dr. KDeatra Ina9/11/15 ?- Most recent colonoscopy 10/09/18: 2 small hyperplastic polyps ?- EGD with Dr. DLoletha Carrow11/17/20: erosive gastropathy, duodenal ulcers ?- EGD 08/01/19: gastritis with resolution of duodenal ulcers ? ? ?Labs 11/02/2020 showed normal liver enzymes, normal CBC ? ?Past Medical History:  ?Diagnosis Date  ? Anemia   ? Anxiety   ? Arthritis   ? Bipolar 1 disorder ([place]   ? [facility]   ? sigmoid colon cancer  ? Depression   ? Diabetes mellitus   ? type II  ? Endometriosis   ? Family history of malignant neoplasm of gastrointestinal tract   ? Ganglion cyst   ? GERD (gastroesophageal reflux disease)   ? Hyperlipidemia   ? Hypothyroidism   ? Insomnia due to mental condition   ? ? ?Past Surgical History:  ?Procedure Laterality Date  ? ABDOMINAL HYSTERECTOMY    ? ABDOMINAL HYSTERECTOMY    ? BIOPSY  12/31/2018  ? Procedure: BIOPSY;  Surgeon: DDoran Stabler MD;  [facility];  Service: Gastroenterology;;  ? COLON RESECTION  June 2010  ? COLON SURGERY    ? COLONOSCOPY    ? DILATION AND CURETTAGE OF UTERUS  2006  ? miscarriage   ? ESOPHAGOGASTRODUODENOSCOPY N/A 12/31/2018  ? Procedure: ESOPHAGOGASTRODUODENOSCOPY (EGD);  Surgeon: Doran Stabler, MD;  Location: [facility];  Service: Gastroenterology;  Laterality: N/A;  ? GANGLION CYST EXCISION    ? HERNIA REPAIR    ? INTERSTIM IMPLANT PLACEMENT Left 12/11/2012  ? stimulator is on the left but the electrodes go to the right  ? KNEE DISLOCATION SURGERY    ? LAPAROSCOPY  1997  ? D & C for endometriosis  ? ? ?Current Outpatient Medications  ?Medication Sig Dispense Refill  ? ALPRAZolam  (XANAX) 0.5 MG tablet Take 0.5 mg by mouth at bedtime as needed for anxiety (panic attack).     ? amphetamine-dextroamphetamine (ADDERALL XR) 30 MG 24 hr capsule Take 30 mg by mouth daily at 6 (six) AM.     ? amphetamine-dextroamphetamine (ADDERALL) 15 MG tablet Take 15 mg by mouth daily at 2 PM.     ? atorvastatin (LIPITOR) 10 MG tablet Take 1 tablet (10 mg total) by mouth daily. 90 tablet 3  ? Blood Glucose Monitoring Suppl (ONETOUCH VERIO FLEX SYSTEM) w/Device KIT USE TO CHECK BLOOD SUGAR TWICE A DAY (DX. E11.9) 1 kit 0  ? cariprazine (VRAYLAR) capsule Take 3 mg by mouth every 3 (three) days.     ? Cholecalciferol (VITAMIN D3) 125 MCG (5000 UT) CAPS Take 5,000 Units by mouth daily.    ? citalopram (CELEXA) 40 MG tablet Take 40 mg by mouth at bedtime.     ? doxepin (SINEQUAN) 50 MG capsule Take 50 mg by mouth at bedtime.   1  ? ferrous sulfate 325 (65 FE) MG tablet Take 325 mg by mouth daily with breakfast.    ? gabapentin (NEURONTIN) 300 MG capsule Take 1 capsule (300 mg total) by mouth 2 (two) times daily. 180 capsule 3  ? glipiZIDE (GLUCOTROL XL) 5 MG 24 hr tablet Take 1 tablet (5 mg total) by mouth daily. 90 tablet 3  ? Lancets (ONETOUCH DELICA PLUS BSWHQP59F) MISC USE TO CHECK BLOOD SUGAR TWICE A DAY 200 each 3  ? levothyroxine (SYNTHROID) 125 MCG tablet TAKE 1 TABLET DAILY 90 tablet 1  ? lisinopril (ZESTRIL) 5 MG tablet Take 1 tablet (5 mg total) by mouth daily. 90 tablet 3  ? metFORMIN (GLUCOPHAGE) 1000 MG tablet Take 0.5 tablets (500 mg total) by mouth 2 (two) times daily with a meal. 90 tablet 3  ? trimethoprim (TRIMPEX) 100 MG tablet Take 100 mg by mouth daily.    ? cyanocobalamin (,VITAMIN B-12,) 1000 MCG/ML injection Inject 1 mL (1,000 mcg total) into the muscle every 3 (three) months. every 8 weeks (Patient not taking: Reported on 05/10/2021) 1 mL 3  ? Cyanocobalamin (VITAMIN B-12) 5000 MCG TBDP Take 5,000 mcg by mouth daily. (Patient not taking: Reported on 05/10/2021)    ? ondansetron (ZOFRAN-ODT) 8  MG disintegrating tablet Take 1 tablet (8 mg total) by mouth every 8 (eight) hours as needed for nausea or vomiting. (Patient not taking: Reported on 05/10/2021) 20 tablet 0  ? ONETOUCH VERIO test strip USE TO CHECK SUGAR TWICE A DAY (Patient not taking: Reported on 05/10/2021) 200 strip 1  ? pantoprazole (PROTONIX) 40 MG tablet Take 1 tablet (40 mg total) by mouth every morning. 90 tablet 3  ? ?No current facility-administered medications for this visit.  ? ? ?Allergies as of 05/10/2021 - Review Complete 05/10/2021  ?Allergen Reaction Noted  ? Keflex [cephalexin] Nausea And Vomiting and Other (See Comments) 12/29/2018  ? Mirabegron Other (See Comments) 09/18/2018  ? ? ?Family History  ?Problem Relation Age of  Onset  ? Breast cancer Mother   ? Diabetes Mother   ? Coronary artery disease Mother   ? Kidney disease Mother   ?     renal insufficiency  ? Hyperlipidemia Mother   ? Diabetes Father   ? Coronary artery disease Father   ? Colon cancer Father   ? Colon cancer Paternal Grandmother   ? Breast cancer Maternal Aunt   ? Breast cancer Paternal Aunt   ? Esophageal cancer Neg Hx   ? Rectal cancer Neg Hx   ? Stomach cancer Neg Hx   ? ? ? ? ?Physical Exam: ?General:   Alert,  well-nourished, pleasant and cooperative in NAD ?Head:  Normocephalic and atraumatic. ?Eyes:  Sclera clear, no icterus.   Conjunctiva pink. ?Abdomen:  Soft, nontender, nondistended, normal bowel sounds, no rebound or guarding. No hepatosplenomegaly.   ?Rectal: No chemical dermatitis. Brown liquid stool present at the anus.  No fistula. No prolapsing hemorrhoids. No rectal prolapse. Normal anocutaneous reflex. No stool in the rectal vault. No mass or fecal impaction. Low anal resting tone and poor squeeze pressure.  Chaperone: Sophia  ?Neurologic:  Alert and  oriented x4;  grossly nonfocal ?Skin:  Intact without significant lesions or rashes. ?Psych:  Alert and cooperative. Normal mood and affect. ? ?Curtisha Bendix L. Tarri Glenn, MD, MPH ?05/11/2021, 9:53  AM ? ? ? ? ?

## 2021-05-10 NOTE — Patient Instructions (Signed)
It was my pleasure to provide care to you today. Based on our discussion, I am providing you with my recommendations below: ? ?RECOMMENDATION(S):  ? ?LABS:  ? ?Please proceed to the basement level for lab work before leaving today. Press "B" on the elevator. The lab is located at the first door on the left as you exit the elevator. ? ?HEALTHCARE LAWS AND MY CHART RESULTS:  ? ?Due to recent changes in healthcare laws, you may see results of your imaging and/or laboratory studies on MyChart before I have had a chance to review them.  I understand that in some cases there may be results that are confusing or concerning to you. Please understand that not all results are received at the same time and often I may need to interpret multiple results in order to provide you with the best plan of care or course of treatment. Therefore, I ask that you please give me 48 hours to thoroughly review all your results before contacting my office for clarification.  ? ? ?FOLLOW UP: ? ?I would like for you to follow up with me in one year. Please call the office at [phone] to schedule your appointment. ? ?BMI: ? ?If you are age 62 or older, your body mass index should be between 23-30. Your Body mass index is 38.3 kg/m?Marland Kitchen If this is out of the aforementioned range listed, please consider follow up with your Primary Care Provider. ? ?If you are age 17 or younger, your body mass index should be between 19-25. Your Body mass index is 38.3 kg/m?Marland Kitchen If this is out of the aformentioned range listed, please consider follow up with your Primary Care Provider.  ? ?MY CHART: ? ?The [facility] providers would like to encourage you to use [facility] to communicate with providers for non-urgent requests or questions.  Due to long hold times on the telephone, sending your provider a message by [facility] may be a faster and more efficient way to get a response.  Please allow 48 business hours for a response.  Please remember that this is for  non-urgent requests.  ? ?Thank you for trusting me with your gastrointestinal care!   ? ?Thornton Park, MD, MPH ? ?

## 2021-05-11 ENCOUNTER — Ambulatory Visit (INDEPENDENT_AMBULATORY_CARE_PROVIDER_SITE_OTHER): Payer: 59 | Admitting: Family Medicine

## 2021-05-11 ENCOUNTER — Encounter: Payer: Self-pay | Admitting: Family Medicine

## 2021-05-11 ENCOUNTER — Encounter: Payer: Self-pay | Admitting: Gastroenterology

## 2021-05-11 VITALS — BP 124/82 | HR 101 | Temp 97.8°F | Ht 62.0 in | Wt 212.0 lb

## 2021-05-11 DIAGNOSIS — E1169 Type 2 diabetes mellitus with other specified complication: Secondary | ICD-10-CM

## 2021-05-11 DIAGNOSIS — E785 Hyperlipidemia, unspecified: Secondary | ICD-10-CM | POA: Diagnosis not present

## 2021-05-11 DIAGNOSIS — R208 Other disturbances of skin sensation: Secondary | ICD-10-CM

## 2021-05-11 DIAGNOSIS — E119 Type 2 diabetes mellitus without complications: Secondary | ICD-10-CM | POA: Diagnosis not present

## 2021-05-11 DIAGNOSIS — N1832 Chronic kidney disease, stage 3b: Secondary | ICD-10-CM | POA: Diagnosis not present

## 2021-05-11 DIAGNOSIS — E538 Deficiency of other specified B group vitamins: Secondary | ICD-10-CM | POA: Diagnosis not present

## 2021-05-11 DIAGNOSIS — Z6833 Body mass index (BMI) 33.0-33.9, adult: Secondary | ICD-10-CM | POA: Insufficient documentation

## 2021-05-11 DIAGNOSIS — E6609 Other obesity due to excess calories: Secondary | ICD-10-CM | POA: Insufficient documentation

## 2021-05-11 DIAGNOSIS — E039 Hypothyroidism, unspecified: Secondary | ICD-10-CM | POA: Diagnosis not present

## 2021-05-11 DIAGNOSIS — I499 Cardiac arrhythmia, unspecified: Secondary | ICD-10-CM

## 2021-05-11 DIAGNOSIS — Z6838 Body mass index (BMI) 38.0-38.9, adult: Secondary | ICD-10-CM

## 2021-05-11 LAB — POCT GLYCOSYLATED HEMOGLOBIN (HGB A1C): Hemoglobin A1C: 7.5 % — AB (ref 4.0–5.6)

## 2021-05-11 MED ORDER — GLIPIZIDE ER 10 MG PO TB24: 10.0000 mg | ORAL_TABLET | Freq: Every day | ORAL | 3 refills | Status: DC

## 2021-05-11 NOTE — Progress Notes (Signed)
? ?Subjective:  ? ? Patient ID: Kelli Cruz, female    DOB: 01-Sep-1960, 61 y.o.   MRN: [id number] ? ?HPI ?Pt presents for f/u of DM2 and chronic health problems  ? ?Wt Readings from Last 3 Encounters:  ?05/11/21 212 lb (96.2 kg)  ?05/10/21 212 lb 12.8 oz (96.5 kg)  ?11/02/20 205 lb 6 oz (93.2 kg)  ? ?38.78 kg/m? ? ?Working  ?Had surgery last week for her interstim (after battery went bad)  ?Will have to adjust sensitivity /glad she got it back  ?Still struggles with continence  ? ? ?DM2 ?Lab Results  ?Component Value Date  ? HGBA1C 7.8 (H) 11/02/2020  ? ?Improved mildly ?Lab Results  ?Component Value Date  ? HGBA1C 7.5 (A) 05/11/2021  ? ? ? ?Metformin 500 mg bid  ?Glipizide xl 5 mg daily  ? ?Ace and statin  ?Lisinopril 5 mg daily  ?Eye exam  ? ?Sees GI for NASH ?Lab Results  ?Component Value Date  ? ALT 14 11/02/2020  ? AST 18 11/02/2020  ? ALKPHOS 76 11/02/2020  ? BILITOT 0.8 11/02/2020  ? ?Labs have remained stable  ? ?Diet: not great/good and bad days  ?Checking glucose bid  ?Sometimes glucose goes up to 130 or 140 in am (? Why)  ?Higher in the afternoon   some close to 200  ?Last week missed one dose of metformin  ?No low glucose readings  ?Lowest was 86 ? ?Talking with her insurance co monthly for help coaching  ?Stress eater  ?Tends to eat/drink at night too much  ?Burning mouth is bothersome - still takes gabapentin  ?Talks to her counselor about that  ? ? ?Exercise : goes up and down the stairs at work constantly  ?Bothers her knee a bit  ? ?BP Readings from Last 3 Encounters:  ?05/11/21 124/82  ?05/10/21 118/80  ?11/02/20 122/76  ? ?Pulse Readings from Last 3 Encounters:  ?05/11/21 (!) 101  ?05/10/21 78  ?11/02/20 75  ? ?At work she has had bp cuff tell her pulse was irregular  ?She does not feel it at all  ?No dizziness or cp or other symptoms  ? ?EKG today shows frequent ectopic beats with nl rate and no ST or T wave changes  ? ? ?Hyperlipidemia ?Lab Results  ?Component Value Date  ? CHOL 196  11/02/2020  ? HDL 42.20 11/02/2020  ? [place] 75 03/08/2018  ? LDLDIRECT 117.0 11/02/2020  ? TRIG 290.0 (H) 11/02/2020  ? CHOLHDL 5 11/02/2020  ?Atorvastatin 10 mg daily  ?Diet is fair  ? ?Mother had CABG/family history  ? ?Ckd 3  ? ?Lab Results  ?Component Value Date  ? CREATININE 1.53 (H) 11/02/2020  ? BUN 31 (H) 11/02/2020  ? NA 139 11/02/2020  ? K 4.6 11/02/2020  ? CL 106 11/02/2020  ? CO2 23 11/02/2020  ? ?Sees nephrology  ?Due for a visit soon  ? ? ?Hypothyroidism  ?Pt has no clinical changes ?No change in energy level/ hair or skin/ edema and no tremor ?Lab Results  ?Component Value Date  ? TSH 0.49 11/02/2020  ?  Levothyroxine 125 mcg daily  ? ? ?Lab Results  ?Component Value Date  ? PXTGGYIR48 >1550 (H) 11/02/2020  ? ?Gets shots every 6 months instead of 3 ? ?Continues mental health care and counseling ?Working on motivation ? ?Patient Active Problem List  ? Diagnosis Date Noted  ? Class 2 severe obesity due to excess calories with serious comorbidity and body mass  index (BMI) of 38.0 to 38.9 in adult [facility]) 05/11/2021  ? Irregular heart beat 05/11/2021  ? Loose stools 04/10/2020  ? Fatigue 09/12/2019  ? Leg cramps 08/15/2019  ? Chronic kidney disease, stage 3b ([place]) 02/03/2019  ? History of GI bleed 12/29/2018  ? Anemia 02/19/2017  ? Vitamin D deficiency 02/19/2017  ? Knee pain, right 11/10/2016  ? Proteinuria 02/18/2016  ? Exposure to communicable disease 08/04/2015  ? Hypersomnia with sleep apnea 01/06/2015  ? Insomnia due to mental condition   ? Tingling 08/12/2014  ? Burning sensation of mouth 08/12/2014  ? Numbness 10/24/2011  ? Routine general medical examination at a health care facility 10/24/2011  ? TMJ arthralgia 08/14/2011  ? Poor posture 07/26/2011  ? B12 deficiency 04/13/2010  ? ADENOCARCINOMA, SIGMOID COLON 08/11/2008  ? Hyperlipidemia associated with type 2 diabetes mellitus ([place]) 07/08/2008  ? Hypothyroidism 09/12/2006  ? Diabetes type 2, controlled ([place]) 09/12/2006  ? Depression with  anxiety 09/12/2006  ? ALLERGIC RHINITIS, SEASONAL 09/12/2006  ? FIBROCYSTIC BREAST DISEASE 09/12/2006  ? MIGRAINES, HX OF 09/12/2006  ? ?Past Medical History:  ?Diagnosis Date  ? Anemia   ? Anxiety   ? Arthritis   ? Bipolar 1 disorder ([place])   ? Cancer [facility])   ? sigmoid colon cancer  ? Depression   ? Diabetes mellitus   ? type II  ? Endometriosis   ? Family history of malignant neoplasm of gastrointestinal tract   ? Ganglion cyst   ? GERD (gastroesophageal reflux disease)   ? Hyperlipidemia   ? Hypothyroidism   ? Insomnia due to mental condition   ? ?Past Surgical History:  ?Procedure Laterality Date  ? ABDOMINAL HYSTERECTOMY    ? ABDOMINAL HYSTERECTOMY    ? BIOPSY  12/31/2018  ? Procedure: BIOPSY;  Surgeon: Doran Stabler, MD;  Location: [facility] ENDOSCOPY;  Service: Gastroenterology;;  ? COLON RESECTION  June 2010  ? COLON SURGERY    ? COLONOSCOPY    ? DILATION AND CURETTAGE OF UTERUS  2006  ? miscarriage   ? ESOPHAGOGASTRODUODENOSCOPY N/A 12/31/2018  ? Procedure: ESOPHAGOGASTRODUODENOSCOPY (EGD);  Surgeon: Doran Stabler, MD;  Location: [place];  Service: Gastroenterology;  Laterality: N/A;  ? GANGLION CYST EXCISION    ? HERNIA REPAIR    ? INTERSTIM IMPLANT PLACEMENT Left 12/11/2012  ? stimulator is on the left but the electrodes go to the right  ? KNEE DISLOCATION SURGERY    ? LAPAROSCOPY  1997  ? D & C for endometriosis  ? ?Social History  ? ?Tobacco Use  ? Smoking status: Never  ?  Passive exposure: Yes  ? Smokeless tobacco: Never  ?Vaping Use  ? Vaping Use: Never used  ?Substance Use Topics  ? Alcohol use: Yes  ?  Alcohol/week: 0.0 standard drinks  ?  Comment: rare  ? Drug use: No  ? ?Family History  ?Problem Relation Age of Onset  ? Breast cancer Mother   ? Diabetes Mother   ? Coronary artery disease Mother   ? Kidney disease Mother   ?     renal insufficiency  ? Hyperlipidemia Mother   ? Diabetes Father   ? Coronary artery disease Father   ? Colon cancer Father   ? Colon cancer Paternal Grandmother    ? Breast cancer Maternal Aunt   ? Breast cancer Paternal Aunt   ? Esophageal cancer Neg Hx   ? Rectal cancer Neg Hx   ? Stomach cancer Neg Hx   ? ?  Allergies  ?Allergen Reactions  ? Keflex [Cephalexin] Nausea And Vomiting and Other (See Comments)  ?  Light-headed/dizziness/weakness  ? Mirabegron Other (See Comments)  ?  Trouble breathing and swollen tongue  ? ?Current Outpatient Medications on File Prior to Visit  ?Medication Sig Dispense Refill  ? ALPRAZolam (XANAX) 0.5 MG tablet Take 0.5 mg by mouth at bedtime as needed for anxiety (panic attack).     ? amphetamine-dextroamphetamine (ADDERALL XR) 30 MG 24 hr capsule Take 30 mg by mouth daily at 6 (six) AM.     ? amphetamine-dextroamphetamine (ADDERALL) 15 MG tablet Take 15 mg by mouth daily at 2 PM.     ? atorvastatin (LIPITOR) 10 MG tablet Take 1 tablet (10 mg total) by mouth daily. 90 tablet 3  ? Blood Glucose Monitoring Suppl (ONETOUCH VERIO FLEX SYSTEM) w/Device KIT USE TO CHECK BLOOD SUGAR TWICE A DAY (DX. E11.9) 1 kit 0  ? cariprazine (VRAYLAR) capsule Take 3 mg by mouth every 3 (three) days.     ? Cholecalciferol (VITAMIN D3) 125 MCG (5000 UT) CAPS Take 5,000 Units by mouth daily.    ? citalopram (CELEXA) 40 MG tablet Take 40 mg by mouth at bedtime.     ? cyanocobalamin (,VITAMIN B-12,) 1000 MCG/ML injection Inject 1 mL (1,000 mcg total) into the muscle every 3 (three) months. every 8 weeks 1 mL 3  ? Cyanocobalamin (VITAMIN B-12) 5000 MCG TBDP Take 5,000 mcg by mouth daily.    ? doxepin (SINEQUAN) 50 MG capsule Take 50 mg by mouth at bedtime.   1  ? ferrous sulfate 325 (65 FE) MG tablet Take 325 mg by mouth daily with breakfast.    ? gabapentin (NEURONTIN) 300 MG capsule Take 1 capsule (300 mg total) by mouth 2 (two) times daily. 180 capsule 3  ? Lancets (ONETOUCH DELICA PLUS [id number]) MISC USE TO CHECK BLOOD SUGAR TWICE A DAY 200 each 3  ? levothyroxine (SYNTHROID) 125 MCG tablet TAKE 1 TABLET DAILY 90 tablet 1  ? lisinopril (ZESTRIL) 5 MG tablet Take 1  tablet (5 mg total) by mouth daily. 90 tablet 3  ? metFORMIN (GLUCOPHAGE) 1000 MG tablet Take 0.5 tablets (500 mg total) by mouth 2 (two) times daily with a meal. 90 tablet 3  ? ondansetron (ZOFRAN-ODT) 8 MG disintegr

## 2021-05-11 NOTE — Assessment & Plan Note (Signed)
TSH ordered ?No clinical changes ?Taking levothyroxine 125 mcg daily  ? ? ?

## 2021-05-11 NOTE — Assessment & Plan Note (Signed)
Lab today  ?Good fluid intake  ?Planning f/u with her nephrologist ?

## 2021-05-11 NOTE — Assessment & Plan Note (Signed)
Disc goals for lipids and reasons to control them ?Rev last labs with pt ?Rev low sat fat diet in detail ?Labs today  ?Diet is not optimal  ?Taking atorvastatin 10 mg daily ?

## 2021-05-11 NOTE — Assessment & Plan Note (Signed)
Lab Results  ?Component Value Date  ? HGBA1C 7.5 (A) 05/11/2021  ? ?Slight improvement  ?Diet is challenging /disc goal of low glycemic diet  ?Plan to continue metformin 500 mg bid and increase her glipizide xl to 10 mg daily (if no hypoglycemia) and also to move dose to am  ?F/u 3 months ?Continue ace and statin  ?Get eye exam utd  ?

## 2021-05-11 NOTE — Assessment & Plan Note (Signed)
This was noticed on bp cuff at work  ?Not symptomatic  ?Today frequent ectopic beats noted ?No h/o a fib  ?Has risk factors for cardiac dz ?Ref done for cardiol eval (possib monitor) ?ER precautions discussed ?

## 2021-05-11 NOTE — Assessment & Plan Note (Signed)
Continues gabapentin.  

## 2021-05-11 NOTE — Patient Instructions (Addendum)
Let's go up on glipizide xl to 10 mg daily (in am with food)  ?If you start to get low glucose (under 80) hold it and let us know ? ?Try taking glipizide xl in the am 5 mg for 3-4 days then if not too low (glucose) go up to 10 mg  ? ?Do the best with diet as you can  ?Exercise also  ? ?I placed a referral to cardiology for irregular heart rate  ?If you feel palpitations or chest pain let us know  ?If you do not get a call in 1-2 weeks call us to inquire about the appointment  ? ? ? ? ?Follow up in 3 months (earlier if needed)  ? ?

## 2021-05-11 NOTE — Assessment & Plan Note (Signed)
Discussed how this problem influences overall health and the risks it imposes  Reviewed plan for weight loss with lower calorie diet (via better food choices and also portion control or program like weight watchers) and exercise building up to or more than 30 minutes 5 days per week including some aerobic activity    

## 2021-05-11 NOTE — Assessment & Plan Note (Signed)
Shots Q 6 mo now after high level ?B12 level ordered today ?

## 2021-05-12 LAB — TSH: TSH: 1.06 u[IU]/mL (ref 0.35–5.50)

## 2021-05-12 LAB — COMPREHENSIVE METABOLIC PANEL
ALT: 11 U/L (ref 0–35)
AST: 16 U/L (ref 0–37)
Albumin: 4.2 g/dL (ref 3.5–5.2)
Alkaline Phosphatase: 81 U/L (ref 39–117)
BUN: 23 mg/dL (ref 6–23)
CO2: 26 mEq/L (ref 19–32)
Calcium: 9.5 mg/dL (ref 8.4–10.5)
Chloride: 103 mEq/L (ref 96–112)
Creatinine, Ser: 1.43 mg/dL — ABNORMAL HIGH (ref 0.40–1.20)
GFR: 40.45 mL/min — ABNORMAL LOW (ref >60.00)
Glucose, Bld: 92 mg/dL (ref 70–99)
Potassium: 4.3 mEq/L (ref 3.5–5.1)
Sodium: 139 mEq/L (ref 135–145)
Total Bilirubin: 0.7 mg/dL (ref 0.2–1.2)
Total Protein: 6.9 g/dL (ref 6.0–8.3)

## 2021-05-12 LAB — NASH FIBROSURE
ALPHA 2-MACROGLOBULINS, QN: 233 mg/dL (ref 110–276)
ALT (SGPT) P5P: 14 IU/L (ref 0–40)
AST (SGOT) P5P: 18 IU/L (ref 0–40)
Apolipoprotein A-1: 108 mg/dL — ABNORMAL LOW (ref 116–209)
Bilirubin, Total: 0.3 mg/dL (ref 0.0–1.2)
Cholesterol, Total: 167 mg/dL (ref 100–199)
Fibrosis Score: 0.16 (ref 0.00–0.21)
GGT: 15 IU/L (ref 0–60)
Glucose: 153 mg/dL — ABNORMAL HIGH (ref 70–99)
Haptoglobin: 271 mg/dL (ref 33–346)
Height: 63 in
NASH Score: 0.5 — ABNORMAL HIGH
Steatosis Score: 0.67 — ABNORMAL HIGH (ref 0.00–0.30)
Triglycerides: 218 mg/dL — ABNORMAL HIGH (ref 0–149)
Weight: 212 [lb_av]

## 2021-05-12 LAB — LIPID PANEL
Cholesterol: 164 mg/dL (ref 0–200)
HDL: 35.7 mg/dL — ABNORMAL LOW (ref >39.00)
NonHDL: 127.98
Total CHOL/HDL Ratio: 5
Triglycerides: 206 mg/dL — ABNORMAL HIGH (ref 0.0–149.0)
VLDL: 41.2 mg/dL — ABNORMAL HIGH (ref 0.0–40.0)

## 2021-05-12 LAB — LDL CHOLESTEROL, DIRECT: Direct LDL: 104 mg/dL

## 2021-05-12 LAB — VITAMIN B12: Vitamin B-12: 681 pg/mL (ref 211–911)

## 2021-05-18 NOTE — Progress Notes (Signed)
?Cardiology Office Note:   ? ?Date:  05/19/2021  [name]:  Kelli Cruz, DOB [birth year], MRN [id number] ? ?PCP:  Abner Greenspan, MD  ? ?[facility] Providers ?Cardiologist:  Lenna Sciara, MD ?Referring MD: Abner Greenspan, MD  ? ?Chief Complaint/Reason for Referral: Palpitations ? ?ASSESSMENT:   ? ?1. Palpitations   ?2. Controlled type 2 diabetes mellitus without complication, without long-term current use of insulin ([place])   ?3. Hypertension associated with diabetes ([place])   ?4. Hyperlipidemia associated with type 2 diabetes mellitus ([place])   ?5. Class 2 severe obesity due to excess calories with serious comorbidity and body mass index (BMI) of 38.0 to 38.9 in adult [facility])   ?6. Chronic kidney disease, stage 3b ([place])   ?7. Precordial pain   ? ? ?PLAN:   ? ?In order of problems listed above: ?1.  We will obtain echocardiogram and monitor.  TSH and other laboratories were relatively normal. ?2.  Given her history of diabetes continue atorvastatin, start aspirin 81 mg, continue lisinopril, and start Jardiance 74m   ?3.  Blood pressures well controlled continue lisinopril. ?4.  [name] LDL recently was 104 which is above goal.  Her goal LDL is less than 70.  Increase Lipitor to 40 mg.  Will check lipid panel and LFTs later. ?5.  Will consider future referral to pharmacy to consider pharmacotherapy for elevated BMI ?6.  Continue lisinopril. ?7.  We will obtain a coronary CTA and echocardiogram to evaluate further.  If the patient has mild obstructive coronary artery disease, they will require a statin (with goal LDL < 70) and aspirin, if they have high-grade disease we will need to consider optimal medical therapy and if symptoms are refractory to medical therapy, then a cardiac catheterization with possible PCI will be pursued to alleviate symptoms.  If they have high risk disease we will proceed directly to cardiac catheterization.  Follow up in 1 year. ? ? ? ?     ? ?  ? ?Dispo:  Return in about 1 year (around  05/20/2022).  ? ?  ? ?Medication Adjustments/Labs and Tests Ordered: ?Current medicines are reviewed at length with the patient today.  Concerns regarding medicines are outlined above. ? ?The following changes have been made:    ? ?Labs/tests ordered: ?No orders of the defined types were placed in this encounter. ? ? ?Medication Changes: ?No orders of the defined types were placed in this encounter. ? ? ? ?Current medicines are reviewed at length with the patient today.  The patient does not have concerns regarding medicines. ? ? ?History of Present Illness:   ? ?FOCUSED PROBLEM LIST:   ?1.  Type 2 diabetes ?2.  Hypertension ?3.  Hyperlipidemia ?4.  BMI of 38 ? ?The patient is a 61y.o. female with the indicated medical history here for recommendations regarding palpitations.  The patient was seen by her primary care provider recently.  She reported an irregular heartbeat.  An EKG was done which demonstrated sinus rhythm with occasional PVCs.  She denies any palpitations.  When she is on a blood pressure monitor she notes an irregular heartbeat.  She denies any shortness of breath.  She occasionally gets chest pain when she walks at work.  She does a good deal of walking.  This does not happen every time she walks.  She has occasionally gotten chest pain sitting down as well.  It does not seem to be related to p.o. intake.  She denies any  significant shortness of breath, syncope, severe bleeding, paroxysmal nocturnal dyspnea, orthopnea.  She does have orthostatic symptoms when she goes from sitting to standing quickly.  She recently had an elective procedure to replace the battery on her bladder stimulator which was uncomplicated. ? ?   ? ?Current Medications: ?Current Meds  ?Medication Sig  ? ALPRAZolam (XANAX) 0.5 MG tablet Take 0.5 mg by mouth at bedtime as needed for anxiety (panic attack).   ? amphetamine-dextroamphetamine (ADDERALL XR) 30 MG 24 hr capsule Take 30 mg by mouth daily at 6 (six) AM.   ?  amphetamine-dextroamphetamine (ADDERALL) 15 MG tablet Take 15 mg by mouth daily at 2 PM.   ? atorvastatin (LIPITOR) 10 MG tablet Take 1 tablet (10 mg total) by mouth daily.  ? Blood Glucose Monitoring Suppl (ONETOUCH VERIO FLEX SYSTEM) w/Device KIT USE TO CHECK BLOOD SUGAR TWICE A DAY (DX. E11.9)  ? cariprazine (VRAYLAR) capsule Take 3 mg by mouth every 3 (three) days.   ? Cholecalciferol (VITAMIN D3) 125 MCG (5000 UT) CAPS Take 5,000 Units by mouth daily.  ? citalopram (CELEXA) 40 MG tablet Take 40 mg by mouth at bedtime.   ? cyanocobalamin (,VITAMIN B-12,) 1000 MCG/ML injection Inject 1 mL (1,000 mcg total) into the muscle every 3 (three) months. every 8 weeks  ? Cyanocobalamin (VITAMIN B-12) 5000 MCG TBDP Take 5,000 mcg by mouth daily.  ? doxepin (SINEQUAN) 50 MG capsule Take 50 mg by mouth at bedtime.   ? ferrous sulfate 325 (65 FE) MG tablet Take 325 mg by mouth daily with breakfast.  ? gabapentin (NEURONTIN) 300 MG capsule Take 1 capsule (300 mg total) by mouth 2 (two) times daily.  ? glipiZIDE (GLUCOTROL XL) 10 MG 24 hr tablet Take 1 tablet (10 mg total) by mouth daily with breakfast.  ? Lancets (ONETOUCH DELICA PLUS [id number]) MISC USE TO CHECK BLOOD SUGAR TWICE A DAY  ? levothyroxine (SYNTHROID) 125 MCG tablet TAKE 1 TABLET DAILY  ? lisinopril (ZESTRIL) 5 MG tablet Take 1 tablet (5 mg total) by mouth daily.  ? metFORMIN (GLUCOPHAGE) 1000 MG tablet Take 0.5 tablets (500 mg total) by mouth 2 (two) times daily with a meal.  ? ondansetron (ZOFRAN-ODT) 8 MG disintegrating tablet Take 1 tablet (8 mg total) by mouth every 8 (eight) hours as needed for nausea or vomiting.  ? ONETOUCH VERIO test strip USE TO CHECK SUGAR TWICE A DAY  ? pantoprazole (PROTONIX) 40 MG tablet Take 1 tablet (40 mg total) by mouth every morning.  ? trimethoprim (TRIMPEX) 100 MG tablet Take 100 mg by mouth daily.  ?  ? ?Allergies:    ?Keflex [cephalexin] and Mirabegron  ? ?Social History:   ?Social History  ? ?Tobacco Use  ? Smoking status:  Never  ?  Passive exposure: Yes  ? Smokeless tobacco: Never  ?Vaping Use  ? Vaping Use: Never used  ?Substance Use Topics  ? Alcohol use: Yes  ?  Alcohol/week: 0.0 standard drinks  ?  Comment: rare  ? Drug use: No  ?  ? ?Family Hx: ?Family History  ?Problem Relation Age of Onset  ? Breast cancer Mother   ? Diabetes Mother   ? Coronary artery disease Mother   ? Kidney disease Mother   ?     renal insufficiency  ? Hyperlipidemia Mother   ? Diabetes Father   ? Coronary artery disease Father   ? Colon cancer Father   ? Colon cancer Paternal Grandmother   ? Breast cancer Maternal  Aunt   ? Breast cancer Paternal Aunt   ? Esophageal cancer Neg Hx   ? Rectal cancer Neg Hx   ? Stomach cancer Neg Hx   ?  ? ?Review of Systems:   ?Please see the history of present illness.    ?All other systems reviewed and are negative. ?  ? ? ?EKGs/Labs/Other Test Reviewed:   ? ?EKG:   Previous ekg performed that demonstrates sinus rhythm with occ PVCs ? ?Prior CV studies: ?None available ? ?Other studies Reviewed: ?Review of the additional studies/records demonstrates: CT 2020/05/16 of abdomen demonstrates mild aortic atherosclerosis ? ?Recent Labs: ?11/02/2020: Hemoglobin 13.0; Platelets 325.0 ?05/11/2021: ALT 11; BUN 23; Creatinine, Ser 1.43; Potassium 4.3; Sodium 139; TSH 1.06  ? ?Recent Lipid Panel ?Lab Results  ?Component Value Date/Time  ? CHOL 164 05/11/2021 04:28 PM  ? CHOL 167 05/10/2021 11:50 AM  ? TRIG 206.0 (H) 05/11/2021 04:28 PM  ? TRIG 218 (H) 05/10/2021 11:50 AM  ? HDL 35.70 (L) 05/11/2021 04:28 PM  ? LDLCALC 75 03/08/2018 08:37 AM  ? LDLDIRECT 104.0 05/11/2021 04:28 PM  ? ? ?Risk Assessment/Calculations:   ? ? ?    ? ?Physical Exam:   ? ?VS:  BP 110/62   Pulse 95   Ht 5' 2.5" (1.588 m)   Wt 211 lb (95.7 kg)   LMP 07/15/2006   SpO2 98%   BMI 37.98 kg/m?    ?Wt Readings from Last 3 Encounters:  ?05/19/21 211 lb (95.7 kg)  ?05/11/21 212 lb (96.2 kg)  ?05/10/21 212 lb 12.8 oz (96.5 kg)  ?  ?GENERAL:  No apparent distress,  AOx3 ?HEENT:  No carotid bruits, +2 carotid impulses, no scleral icterus ?CAR: RRR no murmurs, gallops, rubs, or thrills ?RES:  Clear to auscultation bilaterally ?ABD:  Soft, nontender, nondistended, positive bowel sounds x 4

## 2021-05-19 ENCOUNTER — Ambulatory Visit: Payer: 59 | Admitting: Internal Medicine

## 2021-05-19 ENCOUNTER — Encounter: Payer: Self-pay | Admitting: Internal Medicine

## 2021-05-19 ENCOUNTER — Ambulatory Visit (INDEPENDENT_AMBULATORY_CARE_PROVIDER_SITE_OTHER): Payer: 59

## 2021-05-19 VITALS — BP 110/62 | HR 95 | Ht 62.5 in | Wt 211.0 lb

## 2021-05-19 DIAGNOSIS — R072 Precordial pain: Secondary | ICD-10-CM

## 2021-05-19 DIAGNOSIS — N1832 Chronic kidney disease, stage 3b: Secondary | ICD-10-CM

## 2021-05-19 DIAGNOSIS — I152 Hypertension secondary to endocrine disorders: Secondary | ICD-10-CM

## 2021-05-19 DIAGNOSIS — E66812 Obesity, class 2: Secondary | ICD-10-CM

## 2021-05-19 DIAGNOSIS — E1159 Type 2 diabetes mellitus with other circulatory complications: Secondary | ICD-10-CM

## 2021-05-19 DIAGNOSIS — R002 Palpitations: Secondary | ICD-10-CM

## 2021-05-19 DIAGNOSIS — E1169 Type 2 diabetes mellitus with other specified complication: Secondary | ICD-10-CM | POA: Diagnosis not present

## 2021-05-19 DIAGNOSIS — E119 Type 2 diabetes mellitus without complications: Secondary | ICD-10-CM

## 2021-05-19 DIAGNOSIS — E785 Hyperlipidemia, unspecified: Secondary | ICD-10-CM

## 2021-05-19 DIAGNOSIS — Z6838 Body mass index (BMI) 38.0-38.9, adult: Secondary | ICD-10-CM

## 2021-05-19 MED ORDER — EMPAGLIFLOZIN 10 MG PO TABS: 10.0000 mg | ORAL_TABLET | Freq: Every day | ORAL | 3 refills | Status: DC

## 2021-05-19 MED ORDER — METOPROLOL TARTRATE 100 MG PO TABS: ORAL_TABLET | ORAL | 0 refills | Status: DC

## 2021-05-19 MED ORDER — ASPIRIN EC 81 MG PO TBEC: 81.0000 mg | DELAYED_RELEASE_TABLET | Freq: Every day | ORAL | 11 refills | Status: AC

## 2021-05-19 MED ORDER — ATORVASTATIN CALCIUM 40 MG PO TABS: 40.0000 mg | ORAL_TABLET | Freq: Every day | ORAL | 3 refills | Status: DC

## 2021-05-19 NOTE — Addendum Note (Signed)
Addended by: Aris Georgia, Javarian Jakubiak L on: 05/19/2021 03:16 PM ? ? Modules accepted: Orders ? ?

## 2021-05-19 NOTE — Patient Instructions (Addendum)
Medication Instructions:  ?Your physician has recommended you make the following change in your medication:  ?1-Start Aspirin 81 mg by mouth daily ?2-Increase Lipitor 40 mg by mouth daily. ?3-Start Jardiance 10 mg by mouth daily. ? ? ?*If you need a refill on your cardiac medications before your next appointment, please call your pharmacy* ? ?Lab Work: ?If you have labs (blood work) drawn today and your tests are completely normal, you will receive your results only by: ?MyChart Message (if you have MyChart) OR ?A paper copy in the mail ?If you have any lab test that is abnormal or we need to change your treatment, we will call you to review the results. ? ?Testing/Procedures: ?Your physician has requested that you have an echocardiogram. Echocardiography is a painless test that uses sound waves to create images of your heart. It provides your doctor with information about the size and shape of your heart and how well your heart?s chambers and valves are working. This procedure takes approximately one hour. There are no restrictions for this procedure. ? ?Your physician has recommended that you wear a Zio monitor for one week. Zio monitor are medical devices that record the heart?s electrical activity. Doctors most often [place] these monitors to diagnose arrhythmias. Arrhythmias are problems with the speed or rhythm of the heartbeat. The monitor is a small, portable device. You can wear one while you do your normal daily activities. This is usually used to diagnose what is causing palpitations/syncope (passing out). ? ?Your physician has requested that you have cardiac CT. Cardiac computed tomography (CT) is a painless test that uses an x-ray machine to take clear, detailed pictures of your heart. For further information please visit [email or web address]. Please follow instruction sheet as given. ? ?Follow-Up: ?At [facility], you and your health needs are our priority.  As part of our continuing mission to provide  you with exceptional heart care, we have created designated Provider Care Teams.  These Care Teams include your primary Cardiologist (physician) and Advanced Practice Providers (APPs -  Physician Assistants and Nurse Practitioners) who all work together to provide you with the care you need, when you need it. ? ?We recommend signing up for the patient portal called "MyChart".  Sign up information is provided on this After Visit Summary.  MyChart is used to connect with patients for Virtual Visits (Telemedicine).  Patients are able to view lab/test results, encounter notes, upcoming appointments, etc.  Non-urgent messages can be sent to your provider as well.   ?To learn more about what you can do with MyChart, go to [email or web address].   ? ?Your next appointment:   ?6 month(s) ? ?The format for your next appointment:   ?In Person ? ?Provider:   ?Early Osmond, MD { ? ? ? ?Your cardiac CT will be scheduled at one of the below locations:  ? ?[facility] ?[address] ?[place], [place] [address] ?[phone] ? ?If scheduled at [facility], please arrive at the [facility] and Children's Entrance (Entrance C2) of [facility] 30 minutes prior to test start time. ?You can use the FREE valet parking offered at entrance C (encouraged to control the heart rate for the test)  ?Proceed to the [facility] Radiology Department (first floor) to check-in and test prep. ? ?All radiology patients and guests should use entrance C2 at [facility], accessed from [facility], even though the hospital's physical address listed is [address] ? ? ? ?Please  follow these instructions carefully (unless otherwise directed): ? ? ?On the Night Before the Test: ?Be sure to Drink plenty of water. ?Do not consume any caffeinated/decaffeinated beverages or chocolate 12 hours prior to your test. ?Do not take any antihistamines 12 hours prior to your test. ? ?On the Day of the  Test: ?Drink plenty of water until 1 hour prior to the test. ?Do not eat any food 4 hours prior to the test. ?You may take your regular medications prior to the test.  ?Take metoprolol (Lopressor) 100 mg two hours prior to test. ?FEMALES- please wear underwire-free bra if available, avoid dresses & tight clothing ? ?After the Test: ?Drink plenty of water. ?After receiving IV contrast, you may experience a mild flushed feeling. This is normal. ?On occasion, you may experience a mild rash up to 24 hours after the test. This is not dangerous. If this occurs, you can take Benadryl 25 mg and increase your fluid intake. ?If you experience trouble breathing, this can be serious. If it is severe call 911 IMMEDIATELY. If it is mild, please call our office. ?If you take any of these medications: Glipizide/Metformin, please do not take 48 hours after completing test unless otherwise instructed. ? ?We will call to schedule your test 2-4 weeks out understanding that some insurance companies will need an authorization prior to the service being performed.  ? ?For non-scheduling related questions, please contact the cardiac imaging nurse navigator should you have any questions/concerns: ?Marchia Bond, Cardiac Imaging Nurse Navigator ?Gordy Clement, Cardiac Imaging Nurse Navigator ? Heart and Vascular Services ?Direct Office Dial: [phone]  ? ?For scheduling needs, including cancellations and rescheduling, please call [place], [phone].  ? ?

## 2021-05-19 NOTE — Progress Notes (Unsigned)
Enrolled patient for a 7 day Zio XT monitor to be mailed to patients home.  

## 2021-06-01 ENCOUNTER — Ambulatory Visit (HOSPITAL_BASED_OUTPATIENT_CLINIC_OR_DEPARTMENT_OTHER): Payer: 59

## 2021-06-01 DIAGNOSIS — R002 Palpitations: Secondary | ICD-10-CM

## 2021-06-01 DIAGNOSIS — E1169 Type 2 diabetes mellitus with other specified complication: Secondary | ICD-10-CM

## 2021-06-01 DIAGNOSIS — E1159 Type 2 diabetes mellitus with other circulatory complications: Secondary | ICD-10-CM | POA: Diagnosis present

## 2021-06-01 DIAGNOSIS — E119 Type 2 diabetes mellitus without complications: Secondary | ICD-10-CM

## 2021-06-01 DIAGNOSIS — N1832 Chronic kidney disease, stage 3b: Secondary | ICD-10-CM | POA: Diagnosis present

## 2021-06-01 DIAGNOSIS — I152 Hypertension secondary to endocrine disorders: Secondary | ICD-10-CM | POA: Diagnosis not present

## 2021-06-01 DIAGNOSIS — R072 Precordial pain: Secondary | ICD-10-CM

## 2021-06-01 DIAGNOSIS — E785 Hyperlipidemia, unspecified: Secondary | ICD-10-CM | POA: Diagnosis present

## 2021-06-01 DIAGNOSIS — Z6838 Body mass index (BMI) 38.0-38.9, adult: Secondary | ICD-10-CM | POA: Diagnosis present

## 2021-06-01 LAB — ECHOCARDIOGRAM COMPLETE
Area-P 1/2: 2.12 cm2
S' Lateral: 3 cm

## 2021-06-02 ENCOUNTER — Encounter (HOSPITAL_COMMUNITY): Payer: Self-pay

## 2021-06-02 ENCOUNTER — Ambulatory Visit (HOSPITAL_COMMUNITY)
Admission: RE | Admit: 2021-06-02 | Discharge: 2021-06-02 | Disposition: A | Discharge status: Home / Self Care | Payer: 59 | Source: Ambulatory Visit | Attending: Internal Medicine | Admitting: Internal Medicine

## 2021-06-02 DIAGNOSIS — E66812 Obesity, class 2: Secondary | ICD-10-CM

## 2021-06-02 DIAGNOSIS — R072 Precordial pain: Secondary | ICD-10-CM

## 2021-06-02 DIAGNOSIS — N1832 Chronic kidney disease, stage 3b: Secondary | ICD-10-CM

## 2021-06-02 DIAGNOSIS — E1159 Type 2 diabetes mellitus with other circulatory complications: Secondary | ICD-10-CM

## 2021-06-02 DIAGNOSIS — E119 Type 2 diabetes mellitus without complications: Secondary | ICD-10-CM

## 2021-06-02 DIAGNOSIS — R002 Palpitations: Secondary | ICD-10-CM

## 2021-06-02 DIAGNOSIS — E1169 Type 2 diabetes mellitus with other specified complication: Secondary | ICD-10-CM

## 2021-06-02 DIAGNOSIS — I152 Hypertension secondary to endocrine disorders: Secondary | ICD-10-CM | POA: Insufficient documentation

## 2021-06-02 DIAGNOSIS — E785 Hyperlipidemia, unspecified: Secondary | ICD-10-CM | POA: Insufficient documentation

## 2021-06-02 DIAGNOSIS — Z6838 Body mass index (BMI) 38.0-38.9, adult: Secondary | ICD-10-CM | POA: Insufficient documentation

## 2021-06-02 MED ORDER — NITROGLYCERIN 0.4 MG SL SUBL: 0.8000 mg | SUBLINGUAL_TABLET | Freq: Once | SUBLINGUAL | Status: DC

## 2021-06-02 NOTE — Progress Notes (Signed)
Pt in bigeminy with rate in the 60-70s. Dr. Marisue Ivan notified. Per MD, abort CT at this time. Pt informed that CT scan Is not ideal to evaluate her heart at this time because of her heart rhythm and she verbalizes understanding. Pt discharged home.  ?

## 2021-06-03 ENCOUNTER — Encounter: Payer: Self-pay | Admitting: *Deleted

## 2021-06-03 ENCOUNTER — Telehealth: Payer: Self-pay | Admitting: Internal Medicine

## 2021-06-03 DIAGNOSIS — R072 Precordial pain: Secondary | ICD-10-CM

## 2021-06-03 NOTE — Telephone Encounter (Signed)
Message from Dr. Ali Lowe: Kelli Cruz stress test so no need to exercise.  Yes she should wear the monitor.  Thanks. ? ?Called patient and made her aware that a lexiscan nuclear stress test will be ordered and no exercise will be involved.  She will wear the monitor now for 7 days and should be completed by the time she has her myoview appointment.  She will review instruction letter in [place].  Also provided echo results. ?

## 2021-06-03 NOTE — Telephone Encounter (Signed)
Patient is calling stating they were unable to perform her CT and stated Dr. Ali Lowe will be ordering a stress test. She is wanting to know what kind of stress test due to having a bad knee as well as if she should go ahead and wear her heart monitor for 7 days. Please advise.  ?

## 2021-06-05 DIAGNOSIS — R002 Palpitations: Secondary | ICD-10-CM

## 2021-06-07 ENCOUNTER — Encounter: Payer: Self-pay | Admitting: Internal Medicine

## 2021-06-08 ENCOUNTER — Ambulatory Visit (INDEPENDENT_AMBULATORY_CARE_PROVIDER_SITE_OTHER): Payer: 59 | Admitting: Family Medicine

## 2021-06-08 ENCOUNTER — Ambulatory Visit (INDEPENDENT_AMBULATORY_CARE_PROVIDER_SITE_OTHER)
Admission: RE | Admit: 2021-06-08 | Discharge: 2021-06-08 | Disposition: A | Discharge status: Home / Self Care | Payer: 59 | Source: Ambulatory Visit | Attending: Family Medicine | Admitting: Family Medicine

## 2021-06-08 ENCOUNTER — Encounter: Payer: Self-pay | Admitting: Family Medicine

## 2021-06-08 DIAGNOSIS — R053 Chronic cough: Secondary | ICD-10-CM

## 2021-06-08 NOTE — Patient Instructions (Signed)
Hold lisinopril  ? ?Chest xray today  ? ?Let me know in a week if cough is improved ?Earlier if worse or new symptoms  ? ?Benzotate is fine if helpful ?

## 2021-06-08 NOTE — Progress Notes (Signed)
? ?Subjective:  ? ? Patient ID: Kelli Cruz, female    DOB: 05/04/1960, 61 y.o.   MRN: [id number] ? ?HPI ?Pt presents for lingering cough ? ?Wt Readings from Last 3 Encounters:  ?06/08/21 210 lb 4 oz (95.4 kg)  ?05/19/21 211 lb (95.7 kg)  ?05/11/21 212 lb (96.2 kg)  ? ?37.84 kg/m? ? ?Had interstim surgery recently  ?Had anethesia  ?St/dry mouth/hoarse for a week ?Then better 2 d  ?Then coughing  ? ?One day felt a little sluggish ?No fever  ? ? ?Wearing a heart monitor right now  ? ? ?Teledoc gave her tessalon  ?Still coughing fits /esp at work ?Mostly dry/occ little phlegm  ? ?Today nose is runny - ? Pollen  ?Not usually a problem  ?No wheezing  ? ?Takes protonix  ?No heartburn  ? ?Cxr today is normal ? ? ?Patient Active Problem List  ? Diagnosis Date Noted  ? Chronic cough 06/08/2021  ? Class 2 severe obesity due to excess calories with serious comorbidity and body mass index (BMI) of 38.0 to 38.9 in adult [facility]) 05/11/2021  ? Irregular heart beat 05/11/2021  ? Loose stools 04/10/2020  ? Fatigue 09/12/2019  ? Leg cramps 08/15/2019  ? Chronic kidney disease, stage 3b ([place]) 02/03/2019  ? History of GI bleed 12/29/2018  ? Anemia 02/19/2017  ? Vitamin D deficiency 02/19/2017  ? Knee pain, right 11/10/2016  ? Proteinuria 02/18/2016  ? Exposure to communicable disease 08/04/2015  ? Hypersomnia with sleep apnea 01/06/2015  ? Insomnia due to mental condition   ? Tingling 08/12/2014  ? Burning sensation of mouth 08/12/2014  ? Numbness 10/24/2011  ? Routine general medical examination at a health care facility 10/24/2011  ? TMJ arthralgia 08/14/2011  ? Poor posture 07/26/2011  ? B12 deficiency 04/13/2010  ? ADENOCARCINOMA, SIGMOID COLON 08/11/2008  ? Hyperlipidemia associated with type 2 diabetes mellitus ([place]) 07/08/2008  ? Hypothyroidism 09/12/2006  ? Diabetes type 2, controlled ([place]) 09/12/2006  ? Depression with anxiety 09/12/2006  ? ALLERGIC RHINITIS, SEASONAL 09/12/2006  ? FIBROCYSTIC BREAST DISEASE 09/12/2006  ?  MIGRAINES, HX OF 09/12/2006  ? ?Past Medical History:  ?Diagnosis Date  ? Anemia   ? Anxiety   ? Arthritis   ? Bipolar 1 disorder ([place])   ? Cancer [facility])   ? sigmoid colon cancer  ? Depression   ? Diabetes mellitus   ? type II  ? Endometriosis   ? Family history of malignant neoplasm of gastrointestinal tract   ? Ganglion cyst   ? GERD (gastroesophageal reflux disease)   ? Hyperlipidemia   ? Hypothyroidism   ? Insomnia due to mental condition   ? ?Past Surgical History:  ?Procedure Laterality Date  ? ABDOMINAL HYSTERECTOMY    ? ABDOMINAL HYSTERECTOMY    ? BIOPSY  12/31/2018  ? Procedure: BIOPSY;  Surgeon: Doran Stabler, MD;  Location: [facility] ENDOSCOPY;  Service: Gastroenterology;;  ? COLON RESECTION  June 2010  ? COLON SURGERY    ? COLONOSCOPY    ? DILATION AND CURETTAGE OF UTERUS  2006  ? miscarriage   ? ESOPHAGOGASTRODUODENOSCOPY N/A 12/31/2018  ? Procedure: ESOPHAGOGASTRODUODENOSCOPY (EGD);  Surgeon: Doran Stabler, MD;  Location: [place];  Service: Gastroenterology;  Laterality: N/A;  ? GANGLION CYST EXCISION    ? HERNIA REPAIR    ? INTERSTIM IMPLANT PLACEMENT Left 12/11/2012  ? stimulator is on the left but the electrodes go to the right  ? KNEE DISLOCATION SURGERY    ?  LAPAROSCOPY  1997  ? D & C for endometriosis  ? ?Social History  ? ?Tobacco Use  ? Smoking status: Never  ?  Passive exposure: Yes  ? Smokeless tobacco: Never  ?Vaping Use  ? Vaping Use: Never used  ?Substance Use Topics  ? Alcohol use: Yes  ?  Alcohol/week: 0.0 standard drinks  ?  Comment: rare  ? Drug use: No  ? ?Family History  ?Problem Relation Age of Onset  ? Breast cancer Mother   ? Diabetes Mother   ? Coronary artery disease Mother   ? Kidney disease Mother   ?     renal insufficiency  ? Hyperlipidemia Mother   ? Diabetes Father   ? Coronary artery disease Father   ? Colon cancer Father   ? Colon cancer Paternal Grandmother   ? Breast cancer Maternal Aunt   ? Breast cancer Paternal Aunt   ? Esophageal cancer Neg Hx   ? Rectal  cancer Neg Hx   ? Stomach cancer Neg Hx   ? ?Allergies  ?Allergen Reactions  ? Keflex [Cephalexin] Nausea And Vomiting and Other (See Comments)  ?  Light-headed/dizziness/weakness  ? Mirabegron Other (See Comments)  ?  Trouble breathing and swollen tongue  ? ?Current Outpatient Medications on File Prior to Visit  ?Medication Sig Dispense Refill  ? ALPRAZolam (XANAX) 0.5 MG tablet Take 0.5 mg by mouth at bedtime as needed for anxiety (panic attack).     ? amphetamine-dextroamphetamine (ADDERALL XR) 30 MG 24 hr capsule Take 30 mg by mouth daily at 6 (six) AM.     ? amphetamine-dextroamphetamine (ADDERALL) 15 MG tablet Take 15 mg by mouth daily at 2 PM.     ? aspirin EC 81 MG tablet Take 1 tablet (81 mg total) by mouth daily. Swallow whole. 30 tablet 11  ? atorvastatin (LIPITOR) 40 MG tablet Take 1 tablet (40 mg total) by mouth daily. 90 tablet 3  ? benzonatate (TESSALON) 200 MG capsule Take 1 capsule by mouth 3 (three) times daily as needed.    ? Blood Glucose Monitoring Suppl (ONETOUCH VERIO FLEX SYSTEM) w/Device KIT USE TO CHECK BLOOD SUGAR TWICE A DAY (DX. E11.9) 1 kit 0  ? cariprazine (VRAYLAR) capsule Take 3 mg by mouth every 3 (three) days.     ? Cholecalciferol (VITAMIN D3) 125 MCG (5000 UT) CAPS Take 5,000 Units by mouth daily.    ? citalopram (CELEXA) 40 MG tablet Take 40 mg by mouth at bedtime.     ? cyanocobalamin (,VITAMIN B-12,) 1000 MCG/ML injection Inject 1 mL (1,000 mcg total) into the muscle every 3 (three) months. every 8 weeks 1 mL 3  ? Cyanocobalamin (VITAMIN B-12) 5000 MCG TBDP Take 5,000 mcg by mouth daily.    ? doxepin (SINEQUAN) 50 MG capsule Take 50 mg by mouth at bedtime.   1  ? empagliflozin (JARDIANCE) 10 MG TABS tablet Take 1 tablet (10 mg total) by mouth daily before breakfast. 90 tablet 3  ? ferrous sulfate 325 (65 FE) MG tablet Take 325 mg by mouth daily with breakfast.    ? fluticasone (FLONASE) 50 MCG/ACT nasal spray Place 2 sprays into both nostrils daily.    ? gabapentin (NEURONTIN)  300 MG capsule Take 1 capsule (300 mg total) by mouth 2 (two) times daily. 180 capsule 3  ? glipiZIDE (GLUCOTROL XL) 10 MG 24 hr tablet Take 1 tablet (10 mg total) by mouth daily with breakfast. 90 tablet 3  ? Lancets ([place]  LANCET33G) MISC USE TO CHECK BLOOD SUGAR TWICE A DAY 200 each 3  ? levothyroxine (SYNTHROID) 125 MCG tablet TAKE 1 TABLET DAILY 90 tablet 1  ? metFORMIN (GLUCOPHAGE) 1000 MG tablet Take 0.5 tablets (500 mg total) by mouth 2 (two) times daily with a meal. 90 tablet 3  ? metoprolol tartrate (LOPRESSOR) 100 MG tablet Take one tablet by mouth 2 hours prior to CT test 1 tablet 0  ? ondansetron (ZOFRAN-ODT) 8 MG disintegrating tablet Take 1 tablet (8 mg total) by mouth every 8 (eight) hours as needed for nausea or vomiting. 20 tablet 0  ? ONETOUCH VERIO test strip USE TO CHECK SUGAR TWICE A DAY 200 strip 1  ? pantoprazole (PROTONIX) 40 MG tablet Take 1 tablet (40 mg total) by mouth every morning. 90 tablet 3  ? trimethoprim (TRIMPEX) 100 MG tablet Take 100 mg by mouth daily.    ? ?No current facility-administered medications on file prior to visit.  ?  ?Review of Systems  ?Constitutional:  Negative for activity change, appetite change, fatigue, fever and unexpected weight change.  ?HENT:  Negative for congestion, ear pain, rhinorrhea, sinus pressure and sore throat.   ?Eyes:  Negative for pain, redness and visual disturbance.  ?Respiratory:  Positive for cough. Negative for chest tightness, shortness of breath, wheezing and stridor.   ?Cardiovascular:  Negative for chest pain and palpitations.  ?Gastrointestinal:  Negative for abdominal pain, blood in stool, constipation and diarrhea.  ?     No heartburn  ?Endocrine: Negative for polydipsia and polyuria.  ?Genitourinary:  Positive for frequency and urgency. Negative for dysuria.  ?Musculoskeletal:  Negative for arthralgias, back pain and myalgias.  ?Skin:  Negative for pallor and rash.  ?Allergic/Immunologic: Negative for environmental  allergies.  ?Neurological:  Negative for dizziness, syncope and headaches.  ?Hematological:  Negative for adenopathy. Does not bruise/bleed easily.  ?Psychiatric/Behavioral:  Negative for decreased concent

## 2021-06-08 NOTE — Assessment & Plan Note (Signed)
This began with ST/dry mouth after surgical procedure ?Now persistent and dry  ?Takes ace, this may be a factor  ? ?cxr today : nl /reassuring  ?inst to hold lisinopril and update in a week  ?Denies GERD symptoms or allergies ?Reassuring exam ?

## 2021-06-09 ENCOUNTER — Encounter (HOSPITAL_COMMUNITY): Payer: Self-pay | Admitting: Internal Medicine

## 2021-06-09 ENCOUNTER — Telehealth (HOSPITAL_COMMUNITY): Payer: Self-pay | Admitting: *Deleted

## 2021-06-09 NOTE — Telephone Encounter (Signed)
Left message on voicemail per DPR in reference to upcoming appointment scheduled on 06/13/2021 at 7:45 with detailed instructions given per Myocardial Perfusion Study Information Sheet for the test. LM to arrive 15 minutes early, and that it is imperative to arrive on time for appointment to keep from having the test rescheduled. If you need to cancel or reschedule your appointment, please call the office within 24 hours of your appointment. Failure to do so may result in a cancellation of your appointment, and a $50 no show fee. Phone number given for call back for any questions.  ? ?

## 2021-06-13 ENCOUNTER — Ambulatory Visit (HOSPITAL_COMMUNITY): Payer: 59 | Attending: Cardiology

## 2021-06-13 DIAGNOSIS — R072 Precordial pain: Secondary | ICD-10-CM | POA: Diagnosis not present

## 2021-06-13 LAB — MYOCARDIAL PERFUSION IMAGING
LV dias vol: 78 mL (ref 46–106)
LV sys vol: 44 mL
Nuc Stress EF: 43 %
Peak HR: 94 {beats}/min
Rest HR: 77 {beats}/min
Rest Nuclear Isotope Dose: 10.7 mCi
SDS: 1
SRS: 0
SSS: 1
ST Depression (mm): 0 mm
Stress Nuclear Isotope Dose: 32.1 mCi
TID: 1.16

## 2021-06-13 MED ORDER — TECHNETIUM TC 99M TETROFOSMIN IV KIT
10.7000 | PACK | Freq: Once | INTRAVENOUS | Status: AC | PRN
  Administered 2021-06-13: 10.7 via INTRAVENOUS
  Filled 2021-06-13: qty 11

## 2021-06-13 MED ORDER — REGADENOSON 0.4 MG/5ML IV SOLN
0.4000 mg | Freq: Once | INTRAVENOUS | Status: AC
  Administered 2021-06-13: 0.4 mg via INTRAVENOUS

## 2021-06-13 MED ORDER — TECHNETIUM TC 99M TETROFOSMIN IV KIT
32.1000 | PACK | Freq: Once | INTRAVENOUS | Status: AC | PRN
  Administered 2021-06-13: 32.1 via INTRAVENOUS
  Filled 2021-06-13: qty 33

## 2021-06-23 ENCOUNTER — Encounter: Payer: Self-pay | Admitting: Family Medicine

## 2021-06-24 ENCOUNTER — Telehealth: Payer: Self-pay | Admitting: Internal Medicine

## 2021-06-24 ENCOUNTER — Encounter: Payer: Self-pay | Admitting: Internal Medicine

## 2021-06-24 ENCOUNTER — Telehealth: Payer: Self-pay | Admitting: Family Medicine

## 2021-06-24 MED ORDER — METOPROLOL SUCCINATE ER 25 MG PO TB24: 25.0000 mg | ORAL_TABLET | Freq: Every day | ORAL | 3 refills | Status: DC

## 2021-06-24 NOTE — Telephone Encounter (Signed)
Aware  ?Agree that it is better not to wait until Monday but this is her choice  ?Make sure she understands to go to ER if needed  ? ? ?

## 2021-06-24 NOTE — Telephone Encounter (Signed)
Patient spoke with access nurse, outcome was ED, Patient refused. ? ?Note being sent over ?

## 2021-06-24 NOTE — Telephone Encounter (Signed)
See access nurse note. ?Patient refused to go to facility. ?Appointment scheduled with Dr. Glori Bickers 06/27/21 at 12:00 noon. ?

## 2021-06-24 NOTE — Telephone Encounter (Signed)
Patient calling c/o left side pain for the last week ? ?Scheduled for Monday with Tower, but sending to access nurse for further triage ? ? ?

## 2021-06-24 NOTE — Telephone Encounter (Signed)
ER precautions were given. 

## 2021-06-24 NOTE — Telephone Encounter (Signed)
-----   Message from Early Osmond, MD sent at 06/23/2021  9:01 AM EDT ----- ?Patient had very frequent extra beats from the bottom of the heart but no significant arrhythmias.  Lets start Toprol-XL 25 mg if they are still bothering her. ?

## 2021-06-24 NOTE — Telephone Encounter (Signed)
PLEASE NOTE: All timestamps contained within this report are represented as [place] Standard Time. ?CONFIDENTIALTY NOTICE: This fax transmission is intended only for the addressee. It contains information that is legally privileged, confidential or ?otherwise protected from use or disclosure. If you are not the intended recipient, you are strictly prohibited from reviewing, disclosing, copying using ?or disseminating any of this information or taking any action in reliance on or regarding this information. If you have received this fax in error, please ?notify us immediately by telephone so that we can arrange for its return to [place]. Phone: [phone], Toll-Free: [phone], Fax: [phone] ?Page: 1 of 2 ?Call Id: [id number] ?[facility] - Client ?TELEPHONE ADVICE RECORD ?AccessNurse? ?Patient ?Name: ?Kelli Cruz ?NEY ?Gender: Female ?DOB: 15-May-1960 ?Age: 61 Y 91 M 16 D ?Return ?Phone ?Number: ?[id number] ?(Primary), ?[id number] ?(Secondary) ?Address: ?City/ ?State/ ?Zip: [place] ?[address] ?Client [facility] - Client ?Client Site [facility] ?Provider [facility], Roque Lias - MD ?Contact Type Call ?Who Is Calling Patient / Member / Family / Caregiver ?Call Type Triage / Clinical ?Relationship To Patient Self ?Return Phone Number [phone] (Primary) ?Chief Complaint CHEST PAIN - pain, pressure, heaviness or ?tightness ?Reason for Call Symptomatic / Request for Health Information ?Initial Comment Caller states she is calling from [facility]. Patient is complaining left side pain (below ?breast, around ribs) for the last week and she is ?scheduled for Monday, but caller advised patient ?to speak with triage nurse. ?Additional Comment pain below breast, around ribs. Left side ?Translation No ?Nurse Assessment ?Nurse: Raphael Gibney, RN, Vanita Ingles Date/Time Eilene Ghazi Time): 06/24/2021 8:23:07 AM ?Confirm and document reason for call.  If ?symptomatic, describe symptoms. ?---Caller states she has pain on the left side under her ?left breast at the bottom of her rib cage. has pain for a ?week. pain is constant. pain level 8 last night and pain ?level 2 now and when she moves 7. Pt has appt for ?Monday ?Does the patient have any new or worsening ?symptoms? ---Yes ?Will a triage be completed? ---Yes ?Related visit to physician within the last 2 weeks? ---No ?Does the PT have any chronic conditions? (i.e. ?diabetes, asthma, this includes High risk factors for ?pregnancy, etc.) ?---Yes ?List chronic conditions. ---diabetes; heart issues; depression; thyroid disease. ?Is this a behavioral health or substance abuse call? ---No ?Guidelines ?Guideline Title Affirmed Question Affirmed Notes Nurse Date/Time (Eastern ?Time) ?Abdominal Pain - ?Upper ?[1] Pain lasts > 10 ?minutes AND [2] age ?> 10 ?Raphael Gibney, RN, [place] 06/24/2021 8:26:25 ?AM ?PLEASE NOTE: All timestamps contained within this report are represented as [place] Standard Time. ?CONFIDENTIALTY NOTICE: This fax transmission is intended only for the addressee. It contains information that is legally privileged, confidential or ?otherwise protected from use or disclosure. If you are not the intended recipient, you are strictly prohibited from reviewing, disclosing, copying using ?or disseminating any of this information or taking any action in reliance on or regarding this information. If you have received this fax in error, please ?notify us immediately by telephone so that we can arrange for its return to [place]. Phone: [phone], Toll-Free: [phone], Fax: [phone] ?Page: 2 of 2 ?Call Id: [id number] ?Disp. Time (Eastern ?Time) Disposition Final User ?06/24/2021 8:21:54 AM Send to Urgent Louanne Skye, [place] ?06/24/2021 8:30:54 AM Go to ED Now Yes Raphael Gibney, RN, Vanita Ingles ?Caller Disagree/Comply Disagree ?Caller Understands Yes ?PreDisposition Home Care ?Care Advice Given Per Guideline ?GO TO ED NOW: *  You need to  be seen in the Emergency Department. NOTE TO TRIAGER - DRIVING: * Another adult should ?drive. CALL EMS 911 IF: * Passes out or becomes too weak to stand * Severe difficulty breathing occurs. CARE ADVICE given per ?Abdominal Pain, Upper (Adult) guideline. ?Comments ?User: Dannielle Burn, RN Date/Time Eilene Ghazi Time): 06/24/2021 8:30:14 AM ?pt does not want to go to the ER. ?User: Dannielle Burn, RN Date/Time Eilene Ghazi Time): 06/24/2021 8:33:06 AM ?Called back line and spoke to amy and gave report that pt has having upper abd pain that is constant for the past ?week. had severe pain last night. Triage outcome go to ER now but pt does not want to go. ?Referrals ?GO TO FACILITY REFUSED ?

## 2021-06-24 NOTE — Telephone Encounter (Signed)
Pt returning call regarding results. Please advise 

## 2021-06-24 NOTE — Telephone Encounter (Signed)
Called patient and reviewed results of monitor.  She will start Toprol XL.  All questions/concerns reviewed with her. ? ? ?

## 2021-06-26 ENCOUNTER — Other Ambulatory Visit: Payer: Self-pay | Admitting: Internal Medicine

## 2021-06-27 ENCOUNTER — Ambulatory Visit (INDEPENDENT_AMBULATORY_CARE_PROVIDER_SITE_OTHER)
Admission: RE | Admit: 2021-06-27 | Discharge: 2021-06-27 | Disposition: A | Discharge status: Home / Self Care | Payer: 59 | Source: Ambulatory Visit | Attending: Family Medicine | Admitting: Family Medicine

## 2021-06-27 ENCOUNTER — Encounter: Payer: Self-pay | Admitting: Family Medicine

## 2021-06-27 ENCOUNTER — Ambulatory Visit: Payer: 59 | Admitting: Family Medicine

## 2021-06-27 VITALS — BP 110/68 | HR 96 | Temp 98.1°F | Ht 63.0 in | Wt 209.2 lb

## 2021-06-27 DIAGNOSIS — R0789 Other chest pain: Secondary | ICD-10-CM

## 2021-06-27 DIAGNOSIS — R053 Chronic cough: Secondary | ICD-10-CM

## 2021-06-27 NOTE — Assessment & Plan Note (Signed)
Improved off ace  ?

## 2021-06-27 NOTE — Progress Notes (Signed)
? ?Subjective:  ? ? Patient ID: Kelli Cruz, female    DOB: [birth year], 61 y.o.   MRN: [id number] ? ?HPI ?Pt presents with flank pain / side pain  ? ?Wt Readings from Last 3 Encounters:  ?06/27/21 209 lb 3.2 oz (94.9 kg)  ?06/13/21 210 lb (95.3 kg)  ?06/08/21 210 lb 4 oz (95.4 kg)  ? ?37.06 kg/m? ? ?Started last week - when pulling  ?Hurt to cough and to walk  ?L side under rib cage  ?Aching pain  ?Worse with movement  ? ?Pulling herself up hurts ?Coughing makes it worse also  ?Hurts a little to take a deep breath  ?Bending over  ? ?No trauma  ?She has a tall bed-requires scooting - and felt it originally doing that  ? ?No rash  ? ?No n/v  ?Cough is a little better- just started taking cough med  ?No fever  ? ?No urinary symptoms  ? ?Most recent cxr ?DG Chest 2 View (Accession [id number]) (Order [id number]) ?Imaging ?Date: 06/08/2021 Department: [place] [facility] Ordering/Authorizing: Gumecindo Hopkin, [name], MD  ? ?Exam Status ? ?Status  ?Final [99]  ? ?PACS Intelerad Image Link ? ? Show images for DG Chest 2 View ? ?Study Result ? ?Narrative & Impression  ?CLINICAL DATA:  Dry persistent cough for 1 month, sometimes ?productive cough, shortness of breath with walking sometimes, ?history diabetes mellitus ?  ?EXAM: ?CHEST - 2 VIEW ?  ?COMPARISON:  02/20/2020 ?  ?FINDINGS: ?Cardiac monitoring device projects over the anterior LEFT chest. ?  ?Normal heart size, mediastinal contours, and pulmonary vascularity. ?  ?Lungs clear. ?  ?No infiltrate, pleural effusion, or pneumothorax. ?  ?Osseous structures unremarkable. ?  ?IMPRESSION: ?No acute abnormalities. ?  ? ?Takes trimethoprim daily for uti pref ?Sees nephrology  ?Has known stones in R kidney  ? ?Patient Active Problem List  ? Diagnosis Date Noted  ? Chest wall pain 06/27/2021  ? Chronic cough 06/08/2021  ? Class 2 severe obesity due to excess calories with serious comorbidity and body mass index (BMI) of 38.0 to 38.9 in adult [facility]) 05/11/2021  ? Irregular  heart beat 05/11/2021  ? Loose stools 04/10/2020  ? Fatigue 09/12/2019  ? Leg cramps 08/15/2019  ? Chronic kidney disease, stage 3b ([place]) 02/03/2019  ? History of GI bleed 12/29/2018  ? Anemia 02/19/2017  ? Vitamin D deficiency 02/19/2017  ? Knee pain, right 11/10/2016  ? Proteinuria 02/18/2016  ? Exposure to communicable disease 08/04/2015  ? Hypersomnia with sleep apnea 01/06/2015  ? Insomnia due to mental condition   ? Tingling 08/12/2014  ? Burning sensation of mouth 08/12/2014  ? Numbness 10/24/2011  ? Routine general medical examination at a health care facility 10/24/2011  ? TMJ arthralgia 08/14/2011  ? Poor posture 07/26/2011  ? B12 deficiency 04/13/2010  ? ADENOCARCINOMA, SIGMOID COLON 08/11/2008  ? Hyperlipidemia associated with type 2 diabetes mellitus ([place]) 07/08/2008  ? Hypothyroidism 09/12/2006  ? Diabetes type 2, controlled ([place]) 09/12/2006  ? Depression with anxiety 09/12/2006  ? ALLERGIC RHINITIS, SEASONAL 09/12/2006  ? FIBROCYSTIC BREAST DISEASE 09/12/2006  ? MIGRAINES, HX OF 09/12/2006  ? ?Past Medical History:  ?Diagnosis Date  ? Anemia   ? Anxiety   ? Arthritis   ? Bipolar 1 disorder ([place])   ? Cancer [facility])   ? sigmoid colon cancer  ? Depression   ? Diabetes mellitus   ? type II  ? Endometriosis   ? Family history of malignant neoplasm of  gastrointestinal tract   ? Ganglion cyst   ? GERD (gastroesophageal reflux disease)   ? Hyperlipidemia   ? Hypothyroidism   ? Insomnia due to mental condition   ? ?Past Surgical History:  ?Procedure Laterality Date  ? ABDOMINAL HYSTERECTOMY    ? ABDOMINAL HYSTERECTOMY    ? BIOPSY  12/31/2018  ? Procedure: BIOPSY;  Surgeon: Doran Stabler, MD;  Location: [facility] ENDOSCOPY;  Service: Gastroenterology;;  ? COLON RESECTION  June 2010  ? COLON SURGERY    ? COLONOSCOPY    ? DILATION AND CURETTAGE OF UTERUS  2006  ? miscarriage   ? ESOPHAGOGASTRODUODENOSCOPY N/A 12/31/2018  ? Procedure: ESOPHAGOGASTRODUODENOSCOPY (EGD);  Surgeon: Doran Stabler, MD;  Location: [place];  Service: Gastroenterology;  Laterality: N/A;  ? GANGLION CYST EXCISION    ? HERNIA REPAIR    ? INTERSTIM IMPLANT PLACEMENT Left 12/11/2012  ? stimulator is on the left but the electrodes go to the right  ? KNEE DISLOCATION SURGERY    ? LAPAROSCOPY  1997  ? D & C for endometriosis  ? ?Social History  ? ?Tobacco Use  ? Smoking status: Never  ?  Passive exposure: Yes  ? Smokeless tobacco: Never  ?Vaping Use  ? Vaping Use: Never used  ?Substance Use Topics  ? Alcohol use: Yes  ?  Alcohol/week: 0.0 standard drinks  ?  Comment: rare  ? Drug use: No  ? ?Family History  ?Problem Relation Age of Onset  ? Breast cancer Mother   ? Diabetes Mother   ? Coronary artery disease Mother   ? Kidney disease Mother   ?     renal insufficiency  ? Hyperlipidemia Mother   ? Diabetes Father   ? Coronary artery disease Father   ? Colon cancer Father   ? Colon cancer Paternal Grandmother   ? Breast cancer Maternal Aunt   ? Breast cancer Paternal Aunt   ? Esophageal cancer Neg Hx   ? Rectal cancer Neg Hx   ? Stomach cancer Neg Hx   ? ?Allergies  ?Allergen Reactions  ? Keflex [Cephalexin] Nausea And Vomiting and Other (See Comments)  ?  Light-headed/dizziness/weakness  ? Mirabegron Other (See Comments)  ?  Trouble breathing and swollen tongue  ? ?Current Outpatient Medications on File Prior to Visit  ?Medication Sig Dispense Refill  ? ALPRAZolam (XANAX) 0.5 MG tablet Take 0.5 mg by mouth at bedtime as needed for anxiety (panic attack).     ? amphetamine-dextroamphetamine (ADDERALL XR) 30 MG 24 hr capsule Take 30 mg by mouth daily at 6 (six) AM.     ? amphetamine-dextroamphetamine (ADDERALL) 15 MG tablet Take 15 mg by mouth daily at 2 PM.     ? aspirin EC 81 MG tablet Take 1 tablet (81 mg total) by mouth daily. Swallow whole. 30 tablet 11  ? atorvastatin (LIPITOR) 40 MG tablet Take 1 tablet (40 mg total) by mouth daily. 90 tablet 3  ? benzonatate (TESSALON) 200 MG capsule Take 1 capsule by mouth 3 (three) times daily as needed.    ?  Blood Glucose Monitoring Suppl (ONETOUCH VERIO FLEX SYSTEM) w/Device KIT USE TO CHECK BLOOD SUGAR TWICE A DAY (DX. E11.9) 1 kit 0  ? cariprazine (VRAYLAR) capsule Take 3 mg by mouth every 3 (three) days.     ? Cholecalciferol (VITAMIN D3) 125 MCG (5000 UT) CAPS Take 5,000 Units by mouth daily.    ? citalopram (CELEXA) 40 MG tablet Take 40 mg by  mouth at bedtime.     ? cyanocobalamin (,VITAMIN B-12,) 1000 MCG/ML injection Inject 1 mL (1,000 mcg total) into the muscle every 3 (three) months. every 8 weeks 1 mL 3  ? Cyanocobalamin (VITAMIN B-12) 5000 MCG TBDP Take 5,000 mcg by mouth daily.    ? doxepin (SINEQUAN) 50 MG capsule Take 50 mg by mouth at bedtime.   1  ? empagliflozin (JARDIANCE) 10 MG TABS tablet Take 1 tablet (10 mg total) by mouth daily before breakfast. 90 tablet 3  ? ferrous sulfate 325 (65 FE) MG tablet Take 325 mg by mouth daily with breakfast.    ? fluticasone (FLONASE) 50 MCG/ACT nasal spray Place 2 sprays into both nostrils daily.    ? gabapentin (NEURONTIN) 300 MG capsule Take 1 capsule (300 mg total) by mouth 2 (two) times daily. 180 capsule 3  ? glipiZIDE (GLUCOTROL XL) 10 MG 24 hr tablet Take 1 tablet (10 mg total) by mouth daily with breakfast. 90 tablet 3  ? Lancets (ONETOUCH DELICA PLUS VZCHYI50Y) MISC USE TO CHECK BLOOD SUGAR TWICE A DAY 200 each 3  ? levothyroxine (SYNTHROID) 125 MCG tablet TAKE 1 TABLET DAILY 90 tablet 1  ? metFORMIN (GLUCOPHAGE) 1000 MG tablet Take 0.5 tablets (500 mg total) by mouth 2 (two) times daily with a meal. 90 tablet 3  ? metoprolol succinate (TOPROL XL) 25 MG 24 hr tablet Take 1 tablet (25 mg total) by mouth daily. 90 tablet 3  ? ondansetron (ZOFRAN-ODT) 8 MG disintegrating tablet Take 1 tablet (8 mg total) by mouth every 8 (eight) hours as needed for nausea or vomiting. 20 tablet 0  ? ONETOUCH VERIO test strip USE TO CHECK SUGAR TWICE A DAY 200 strip 1  ? pantoprazole (PROTONIX) 40 MG tablet Take 1 tablet (40 mg total) by mouth every morning. 90 tablet 3  ?  trimethoprim (TRIMPEX) 100 MG tablet Take 100 mg by mouth daily.    ? ?No current facility-administered medications on file prior to visit.  ?  ?Review of Systems  ?Constitutional:  Negative for activit

## 2021-06-27 NOTE — Patient Instructions (Addendum)
Take tylenol as needed  ? ?Drink fluids  ?Use heat gently as needed  ? ?Get a good deep breath at least every 30-60 minutes to help prevent pneumonia  ? ?Voltaren gel over the counter - helps topically up to four times daily  ? ?We will contact you with an xray report  ? ? ?

## 2021-06-27 NOTE — Assessment & Plan Note (Signed)
L ant/lower pain with tenderness  ?No trauma ?Triggered by movement  ?Recent nl cxr with cough ?Rib xray ordered  ?Suspect MSK/poss costochondritis  ? ?Adv heat/gentle and deep breaths to prevent atelectasis  ?

## 2021-06-29 ENCOUNTER — Telehealth: Payer: Self-pay | Admitting: Internal Medicine

## 2021-06-29 NOTE — Telephone Encounter (Signed)
Should not be an issue taking metoprolol. It is not related to Myrbetriq. ?

## 2021-06-29 NOTE — Telephone Encounter (Signed)
Pt c/o medication issue: ? ?1. Name of Medication: metoprolol succinate (TOPROL XL) 25 MG 24 hr tablet ? ?2. How are you currently taking this medication (dosage and times per day)? Take 1 tablet (25 mg total) by mouth daily. ? ?3. Are you having a reaction (difficulty breathing--STAT)?  ? ?4. What is your medication issue? [facility] call stating patient has indicated she has allergic sensitivity to Beta-blockers.  They want to make sure it's okay to fill the prescription.   ?

## 2021-06-29 NOTE — Telephone Encounter (Signed)
Left message for patient to call back  

## 2021-06-29 NOTE — Telephone Encounter (Signed)
Patient returning call. Transferred to Rodman Key, RN.  ?

## 2021-06-29 NOTE — Telephone Encounter (Signed)
Reviewed with patient that I received a message from [place] about a possible allergy/cross sensitivity.    The patient reports that some time ago she was prescribed Myrbetriq for her bladder and it made her tongue swell.  I will double check with our Clinical Pharmacist that pt will be able to safely take beta blockers with this. ?

## 2021-06-30 NOTE — Telephone Encounter (Signed)
Rodman Key, RN at 06/29/2021  5:02 PM  Status: Signed  Reviewed with patient that I received a message from [place] about a possible allergy/cross sensitivity.    The patient reports that some time ago she was prescribed Myrbetriq for her bladder and it made her tongue swell.  I will double check with our Clinical Pharmacist that pt will be able to safely take beta blockers with this.      Rollen Sox, [facility] at 06/29/2021  5:40 PM  Status: Signed  Should not be an issue taking metoprolol. It is not related to Myrbetriq.       Will reply to pharmacy.  Should be no concerns with beta blocker.

## 2021-07-05 ENCOUNTER — Encounter: Payer: Self-pay | Admitting: Internal Medicine

## 2021-07-10 ENCOUNTER — Other Ambulatory Visit: Payer: Self-pay | Admitting: Family Medicine

## 2021-07-12 ENCOUNTER — Encounter: Payer: Self-pay | Admitting: Family Medicine

## 2021-07-12 NOTE — Telephone Encounter (Signed)
F/U scheduled for 08/23/21, gabapentin last filled on 04/09/20 #180 caps with 3 refills

## 2021-07-12 NOTE — Telephone Encounter (Signed)
Message to pt:  I want to try acid reflux medication  You may have some "silent" reflux causing cough   Take generic pepcid 20 mg twice daily  Follow up is planned in July but make an appt earlier if this does not help   Px pended to send to the pharmacy of choice

## 2021-07-13 MED ORDER — FAMOTIDINE 20 MG PO TABS: 20.0000 mg | ORAL_TABLET | Freq: Two times a day (BID) | ORAL | 1 refills | Status: DC

## 2021-07-30 ENCOUNTER — Other Ambulatory Visit: Payer: Self-pay | Admitting: Family Medicine

## 2021-08-05 MED ORDER — DOXYCYCLINE HYCLATE 100 MG PO TABS: 100.0000 mg | ORAL_TABLET | Freq: Two times a day (BID) | ORAL | 0 refills | Status: DC

## 2021-08-15 ENCOUNTER — Encounter: Payer: Self-pay | Admitting: Family Medicine

## 2021-08-23 ENCOUNTER — Ambulatory Visit: Payer: 59 | Admitting: Family Medicine

## 2021-08-30 ENCOUNTER — Ambulatory Visit: Payer: 59 | Admitting: Family Medicine

## 2021-09-08 ENCOUNTER — Ambulatory Visit (INDEPENDENT_AMBULATORY_CARE_PROVIDER_SITE_OTHER): Payer: 59 | Admitting: Family Medicine

## 2021-09-08 ENCOUNTER — Encounter: Payer: Self-pay | Admitting: Family Medicine

## 2021-09-08 VITALS — BP 100/64 | HR 77 | Temp 98.0°F | Ht 62.5 in | Wt 209.0 lb

## 2021-09-08 DIAGNOSIS — E1169 Type 2 diabetes mellitus with other specified complication: Secondary | ICD-10-CM | POA: Diagnosis not present

## 2021-09-08 DIAGNOSIS — R053 Chronic cough: Secondary | ICD-10-CM

## 2021-09-08 DIAGNOSIS — N1832 Chronic kidney disease, stage 3b: Secondary | ICD-10-CM

## 2021-09-08 DIAGNOSIS — K140 Glossitis: Secondary | ICD-10-CM | POA: Insufficient documentation

## 2021-09-08 DIAGNOSIS — E119 Type 2 diabetes mellitus without complications: Secondary | ICD-10-CM | POA: Diagnosis not present

## 2021-09-08 DIAGNOSIS — E039 Hypothyroidism, unspecified: Secondary | ICD-10-CM | POA: Diagnosis not present

## 2021-09-08 DIAGNOSIS — E785 Hyperlipidemia, unspecified: Secondary | ICD-10-CM

## 2021-09-08 LAB — POCT GLYCOSYLATED HEMOGLOBIN (HGB A1C): Hemoglobin A1C: 8.3 % — AB (ref 4.0–5.6)

## 2021-09-08 MED ORDER — EMPAGLIFLOZIN 25 MG PO TABS: 25.0000 mg | ORAL_TABLET | Freq: Every day | ORAL | 3 refills | Status: DC

## 2021-09-08 NOTE — Assessment & Plan Note (Signed)
Ulcer on R lateral tongue after biting it several weeks ago  Reassuring exam Enc use of salt water swish  Can try analgesic as well  Will monitor inst pt to call if not resolved in 2 weeks

## 2021-09-08 NOTE — Assessment & Plan Note (Signed)
Disc goals for lipids and reasons to control them Rev last labs with pt Rev low sat fat diet in detail  LDL of 75 Tolerating atorvastatin 10 mg daily

## 2021-09-08 NOTE — Assessment & Plan Note (Signed)
Seen at [facility]  F/u due in [place] with care   uti proph has helped significantly Last cr 1.43 here , improved

## 2021-09-08 NOTE — Assessment & Plan Note (Signed)
Improved after stopping ace but not resolved  Phlegm is clear after tx with abx Nl lung exam today  No resp concerns Denies any reflux symptoms with ppi  Ref to pulmonary for further eval

## 2021-09-08 NOTE — Assessment & Plan Note (Signed)
Hypothyroidism  Pt has no clinical changes No change in energy level/ hair or skin/ edema and no tremor Lab Results  Component Value Date   TSH 1.06 05/11/2021    Plan to continue levothyroxine 125 mcg daily

## 2021-09-08 NOTE — Patient Instructions (Addendum)
Drink only sugar free beverages Keep up a good water intake  Try to stick to a low glycemic diet   Stay active/ keep walking  Be careful in the heat   Continue salt water swish for the tongue injury  If not much improved/resolved in 2 weeks let us know  I will refer you to pulmonary for the chronic cough  If you don't hear from someone in 1-2 weeks let us know   Go up on the jardiance to 25 mg daily  If any problems please let us know   Take care of yourself   Follow up in 3 months with labs that day

## 2021-09-08 NOTE — Progress Notes (Signed)
Subjective:    Patient ID: Kelli Cruz, female    DOB: 02-Aug-1960, 61 y.o.   MRN: [id number]  HPI Pt presents for f/u of DM2 and chronic medical problems   Wt Readings from Last 3 Encounters:  09/08/21 209 lb (94.8 kg)  06/27/21 209 lb 3.2 oz (94.9 kg)  06/13/21 210 lb (95.3 kg)   37.62 kg/m  Has been feeling ok  Still has cough- but not as bad  Feels like something is crackling in throat  Gets worse with big temp change  The antibiotic helped a lot  Now phlegm is mostly clear  No wheezing  No sob   She clears her throat also  Does not feel heartburn  H2 blocker did not help    BP Readings from Last 3 Encounters:  09/08/21 100/64  06/27/21 110/68  06/08/21 128/66   Pulse Readings from Last 3 Encounters:  09/08/21 77  06/27/21 96  06/08/21 64   Saw cardiology for palpitations  Now on metoprolol  Less palpitations  Is tired at times / no chest pain  Unsure if fatigue is side effect    DM2 Lab Results  Component Value Date   HGBA1C 8.3 (A) 09/08/2021   This is up from 7.5 last time  Diet -not as good  Eating differently- smaller portions  Doing ok with sweets- very rarely chips or sweet item  Likes to snack late at night  She drinks power aide a lot when she is hot   She bought a sugar free mio sport- to cut out the calories  Wants to drink more water   Glucose ranges 90s to 180s The sugar drinks make it worse   Metformin 500 mg bid  Increased glipizide xl to 10 mg daily and moved dose to am  Jardiance 10 mg daily -from cardiology     Stopped ace due to cough   Lab Results  Component Value Date   MICROALBUR 10.0 (H) 02/03/2015   MICROALBUR 0.3 11/28/2012    She sees nephrology  Bit her tongue recently  Using salt water swish  Still am ulcer tere    Ckd Lab Results  Component Value Date   CREATININE 1.43 (H) 05/11/2021   BUN 23 05/11/2021   NA 139 05/11/2021   K 4.3 05/11/2021   CL 103 05/11/2021   CO2 26 05/11/2021      Hyperlipidemia Lab Results  Component Value Date   CHOL 164 05/11/2021   HDL 35.70 (L) 05/11/2021   LDLCALC 75 03/08/2018   LDLDIRECT 104.0 05/11/2021   TRIG 206.0 (H) 05/11/2021   CHOLHDL 5 05/11/2021   Atorvastatin 10 mg daily   Lab Results  Component Value Date   TSH 1.06 05/11/2021   Levothy 125 mcg daily   Patient Active Problem List   Diagnosis Date Noted   Chest wall pain 06/27/2021   Chronic cough 06/08/2021   Class 2 severe obesity due to excess calories with serious comorbidity and body mass index (BMI) of 38.0 to 38.9 in adult ([place]) 05/11/2021   Irregular heart beat 05/11/2021   Loose stools 04/10/2020   Fatigue 09/12/2019   Leg cramps 08/15/2019   Chronic kidney disease, stage 3b ([place]) 02/03/2019   History of GI bleed 12/29/2018   Anemia 02/19/2017   Vitamin D deficiency 02/19/2017   Knee pain, right 11/10/2016   Proteinuria 02/18/2016   Exposure to communicable disease 08/04/2015   Hypersomnia with sleep apnea 01/06/2015   Insomnia due to mental  condition    Tingling 08/12/2014   Burning sensation of mouth 08/12/2014   Numbness 10/24/2011   Routine general medical examination at a health care facility 10/24/2011   TMJ arthralgia 08/14/2011   Poor posture 07/26/2011   B12 deficiency 04/13/2010   ADENOCARCINOMA, SIGMOID COLON 08/11/2008   Hyperlipidemia associated with type 2 diabetes mellitus ([place]) 07/08/2008   Hypothyroidism 09/12/2006   Diabetes type 2, controlled ([place]) 09/12/2006   Depression with anxiety 09/12/2006   ALLERGIC RHINITIS, SEASONAL 09/12/2006   FIBROCYSTIC BREAST DISEASE 09/12/2006   MIGRAINES, HX OF 09/12/2006   Past Medical History:  Diagnosis Date   Anemia    Anxiety    Arthritis    Bipolar 1 disorder ([place])    Cancer ([place])    sigmoid colon cancer   Depression    Diabetes mellitus    type II   Endometriosis    Family history of malignant neoplasm of gastrointestinal tract    Ganglion cyst    GERD  (gastroesophageal reflux disease)    Hyperlipidemia    Hypothyroidism    Insomnia due to mental condition    Past Surgical History:  Procedure Laterality Date   ABDOMINAL HYSTERECTOMY     ABDOMINAL HYSTERECTOMY     BIOPSY  12/31/2018   Procedure: BIOPSY;  Surgeon: Doran Stabler, MD;  Location: [facility] ENDOSCOPY;  Service: Gastroenterology;;   COLON RESECTION  June 2010   [place] AND CURETTAGE OF UTERUS  2006   miscarriage    ESOPHAGOGASTRODUODENOSCOPY N/A 12/31/2018   Procedure: ESOPHAGOGASTRODUODENOSCOPY (EGD);  Surgeon: Doran Stabler, MD;  Location: [place];  Service: Gastroenterology;  Laterality: N/A;   GANGLION CYST EXCISION     HERNIA REPAIR     INTERSTIM IMPLANT PLACEMENT Left 12/11/2012   stimulator is on the left but the electrodes go to the right   [place]   D & C for endometriosis   Social History   Tobacco Use   Smoking status: Never    Passive exposure: Yes   Smokeless tobacco: Never  Vaping Use   Vaping Use: Never used  Substance Use Topics   Alcohol use: Yes    Alcohol/week: 0.0 standard drinks of alcohol    Comment: rare   Drug use: No   Family History  Problem Relation Age of Onset   Breast cancer Mother    Diabetes Mother    Coronary artery disease Mother    Kidney disease Mother        renal insufficiency   Hyperlipidemia Mother    Diabetes Father    Coronary artery disease Father    Colon cancer Father    Colon cancer Paternal Grandmother    Breast cancer Maternal Aunt    Breast cancer Paternal Aunt    Esophageal cancer Neg Hx    Rectal cancer Neg Hx    Stomach cancer Neg Hx    Allergies  Allergen Reactions   Keflex [Cephalexin] Nausea And Vomiting and Other (See Comments)    Light-headed/dizziness/weakness   Mirabegron Other (See Comments)    Trouble breathing and swollen tongue   Current Outpatient Medications on File Prior to Visit  Medication  Sig Dispense Refill   ALPRAZolam (XANAX) 0.5 MG tablet Take 0.5 mg by mouth at bedtime as needed for anxiety (panic attack).      amphetamine-dextroamphetamine (ADDERALL XR) 30 MG 24 hr capsule Take 30  mg by mouth daily at 6 (six) AM.      amphetamine-dextroamphetamine (ADDERALL) 15 MG tablet Take 15 mg by mouth daily at 2 PM.      aspirin EC 81 MG tablet Take 1 tablet (81 mg total) by mouth daily. Swallow whole. 30 tablet 11   Blood Glucose Monitoring Suppl (ONETOUCH VERIO FLEX SYSTEM) w/Device KIT USE TO CHECK BLOOD SUGAR TWICE A DAY (DX. E11.9) 1 kit 0   cariprazine (VRAYLAR) capsule Take 3 mg by mouth every 3 (three) days.      Cholecalciferol (VITAMIN D3) 125 MCG (5000 UT) CAPS Take 5,000 Units by mouth daily.     citalopram (CELEXA) 40 MG tablet Take 40 mg by mouth at bedtime.      cyanocobalamin (,VITAMIN B-12,) 1000 MCG/ML injection Inject 1 mL (1,000 mcg total) into the muscle every 3 (three) months. every 8 weeks 1 mL 3   Cyanocobalamin (VITAMIN B-12) 5000 MCG TBDP Take 5,000 mcg by mouth daily.     doxepin (SINEQUAN) 50 MG capsule Take 50 mg by mouth at bedtime.   1   ferrous sulfate 325 (65 FE) MG tablet Take 325 mg by mouth daily with breakfast.     fluticasone (FLONASE) 50 MCG/ACT nasal spray Place 2 sprays into both nostrils daily.     gabapentin (NEURONTIN) 300 MG capsule TAKE 1 CAPSULE TWICE DAILY 180 capsule 1   glipiZIDE (GLUCOTROL XL) 10 MG 24 hr tablet Take 1 tablet (10 mg total) by mouth daily with breakfast. 90 tablet 3   Lancets (ONETOUCH DELICA PLUS [id number]) MISC USE TO CHECK BLOOD SUGAR TWICE A DAY 200 each 3   levothyroxine (SYNTHROID) 125 MCG tablet TAKE 1 TABLET DAILY 90 tablet 1   metFORMIN (GLUCOPHAGE) 1000 MG tablet TAKE 1/2 TABLET TWICE A DAYWITH MEALS 90 tablet 0   ONETOUCH VERIO test strip USE TO CHECK SUGAR TWICE A DAY 200 strip 1   pantoprazole (PROTONIX) 40 MG tablet Take 1 tablet (40 mg total) by mouth every morning. 90 tablet 3   trimethoprim (TRIMPEX)  100 MG tablet Take 100 mg by mouth daily.     metoprolol succinate (TOPROL XL) 25 MG 24 hr tablet TAKE 1 TABLET DAILY. 90 tablet 3   No current facility-administered medications on file prior to visit.    Review of Systems  Constitutional:  Negative for activity change, appetite change, fatigue, fever and unexpected weight change.  HENT:  Negative for congestion, ear pain, rhinorrhea, sinus pressure and sore throat.        Bit tongue and has an ulcer on it   Eyes:  Negative for pain, redness and visual disturbance.  Respiratory:  Negative for cough, shortness of breath and wheezing.   Cardiovascular:  Negative for chest pain and palpitations.  Gastrointestinal:  Negative for abdominal pain, blood in stool, constipation and diarrhea.  Endocrine: Negative for polydipsia and polyuria.  Genitourinary:  Negative for dysuria, frequency and urgency.  Musculoskeletal:  Negative for arthralgias, back pain and myalgias.  Skin:  Negative for pallor and rash.  Allergic/Immunologic: Negative for environmental allergies.  Neurological:  Negative for dizziness, syncope and headaches.  Hematological:  Negative for adenopathy. Does not bruise/bleed easily.  Psychiatric/Behavioral:  Negative for decreased concentration and dysphoric mood. The patient is not nervous/anxious.        Mood has been better lately       Objective:   Physical Exam Constitutional:      General: She is not in acute distress.  Appearance: Normal appearance. She is well-developed. She is obese. She is not ill-appearing or diaphoretic.  HENT:     Head: Normocephalic and atraumatic.  Eyes:     General: No scleral icterus.    Conjunctiva/sclera: Conjunctivae normal.     Pupils: Pupils are equal, round, and reactive to light.  Neck:     Thyroid: No thyromegaly.     Vascular: No carotid bruit or JVD.  Cardiovascular:     Rate and Rhythm: Normal rate and regular rhythm.     Heart sounds: Normal heart sounds.     No gallop.   Pulmonary:     Effort: Pulmonary effort is normal. No respiratory distress.     Breath sounds: Normal breath sounds. No wheezing or rales.  Abdominal:     General: There is no distension or abdominal bruit.     Palpations: Abdomen is soft.  Musculoskeletal:     Cervical back: Normal range of motion and neck supple.     Right lower leg: No edema.     Left lower leg: No edema.  Lymphadenopathy:     Cervical: No cervical adenopathy.  Skin:    General: Skin is warm and dry.     Coloration: Skin is not pale.     Findings: No rash.  Neurological:     Mental Status: She is alert.     Coordination: Coordination normal.     Deep Tendon Reflexes: Reflexes are normal and symmetric. Reflexes normal.  Psychiatric:        Mood and Affect: Mood normal.           Assessment & Plan:   Problem List Items Addressed This Visit       Digestive   Ulceration, tongue traumatic    Ulcer on R lateral tongue after biting it several weeks ago  Reassuring exam Enc use of salt water swish  Can try analgesic as well  Will monitor inst pt to call if not resolved in 2 weeks         Endocrine   Diabetes type 2, controlled ([place]) - Primary    Lab Results  Component Value Date   HGBA1C 8.3 (A) 09/08/2021  This is up from 7.5  Pt has been drinking a lot of power ade with sugar working outdoors  Open to change to [employer] free beverages  Otherwise needs to cut down pm snacking  Glucose ranges widely  Taking  Metformin 500 mg bid Glipizide xl 10 mg daily (no hypoglycemia) jardiance 10 mg daily -disc inc this to 25 mg daily for better glucose control (watching renal fxn closely) She has nephrology visit in sept due  F/u here 3 mo  Keep up water intake and keep working on low glycemic diet  Planning DM eye exam       Relevant Medications   empagliflozin (JARDIANCE) 25 MG TABS tablet   Other Relevant Orders   HgB A1c (Completed)   Hyperlipidemia associated with type 2 diabetes  mellitus ([place])    Disc goals for lipids and reasons to control them Rev last labs with pt Rev low sat fat diet in detail  LDL of 75 Tolerating atorvastatin 10 mg daily       Relevant Medications   empagliflozin (JARDIANCE) 25 MG TABS tablet   Hypothyroidism    Hypothyroidism  Pt has no clinical changes No change in energy level/ hair or skin/ edema and no tremor Lab Results  Component Value Date   TSH 1.06 05/11/2021  Plan to continue levothyroxine 125 mcg daily          Genitourinary   Chronic kidney disease, stage 3b ([place])    Seen at [facility]  F/u due in [place] with care   uti proph has helped significantly Last cr 1.43 here , improved         Other   Chronic cough    Improved after stopping ace but not resolved  Phlegm is clear after tx with abx Nl lung exam today  No resp concerns Denies any reflux symptoms with ppi  Ref to pulmonary for further eval       Relevant Orders   Ambulatory referral to Pulmonology

## 2021-09-08 NOTE — Assessment & Plan Note (Signed)
Lab Results  Component Value Date   HGBA1C 8.3 (A) 09/08/2021   This is up from 7.5  Pt has been drinking a lot of power ade with sugar working outdoors  Open to change to [employer] free beverages  Otherwise needs to cut down pm snacking  Glucose ranges widely  Taking  Metformin 500 mg bid Glipizide xl 10 mg daily (no hypoglycemia) jardiance 10 mg daily -disc inc this to 25 mg daily for better glucose control (watching renal fxn closely) She has nephrology visit in sept due  F/u here 3 mo  Keep up water intake and keep working on low glycemic diet  Planning DM eye exam

## 2021-09-29 ENCOUNTER — Other Ambulatory Visit: Payer: Self-pay | Admitting: Family Medicine

## 2021-10-17 ENCOUNTER — Other Ambulatory Visit: Payer: Self-pay | Admitting: Family Medicine

## 2021-10-20 ENCOUNTER — Ambulatory Visit (INDEPENDENT_AMBULATORY_CARE_PROVIDER_SITE_OTHER): Payer: 59 | Admitting: Emergency Medicine

## 2021-10-20 ENCOUNTER — Encounter: Payer: Self-pay | Admitting: Emergency Medicine

## 2021-10-20 DIAGNOSIS — R053 Chronic cough: Secondary | ICD-10-CM

## 2021-10-20 MED ORDER — PANTOPRAZOLE SODIUM 40 MG PO TBEC: 40.0000 mg | DELAYED_RELEASE_TABLET | Freq: Two times a day (BID) | ORAL | 1 refills | Status: DC

## 2021-10-20 MED ORDER — HYDROCODONE BIT-HOMATROP MBR 5-1.5 MG/5ML PO SOLN
5.0000 mL | Freq: Four times a day (QID) | ORAL | 0 refills | Status: DC | PRN
Start: 2021-10-20 — End: 2022-08-28

## 2021-10-20 MED ORDER — LORATADINE 10 MG PO TABS: 10.0000 mg | ORAL_TABLET | Freq: Every day | ORAL | 1 refills | Status: DC

## 2021-10-20 NOTE — Assessment & Plan Note (Signed)
Chronic cough now for about 6 months.  Sounds principally upper airway in nature, dates back to when she had a procedure to change a stimulator battery.  She does not believe she was intubated but notes that she had very dry throat, dry mouth in the perioperative period.  We will try to treat the sustaining factors including GERD, allergic rhinitis more aggressively (some of this has already been tried).  We will also try to suppress her cough and avoid throat clearing to allow her upper airway to settle > Delsym during the day, Hycodan at night.  If she continues to have cough then consider upper airway inspection and evaluation for eosinophilia, consider PFT.   We will temporarily increase your pantoprazole to 40 mg twice a day.  Take this medication 1 hour around food.  Continue this dosing until we follow-up Please start loratadine 10 mg once daily (generic Claritin) Try to avoid throat clearing.  It may be helpful for you to use sugar-free candy or not methylated cough drop.  Keep this in your mouth and when you have the urge to clear your throat just swallow instead. We will try to suppress your cough to minimize throat irritation.  Try using Delsym as directed during the daytime.  I will send a prescription for Hycodan cough syrup.  You can use 5 cc up to every 6 hours if needed for cough suppression.  This can make you drowsy so you will probably want to use it principally at night. Depending on how your cough improves we will consider pulmonary function testing or possible airway inspection by bronchoscopy in the future. Follow with Dr. Lamonte Sakai in about 2 months to review your status.

## 2021-10-20 NOTE — Addendum Note (Signed)
Addended by: Collene Gobble on: 10/20/2021 12:08 PM   Modules accepted: Orders

## 2021-10-20 NOTE — Addendum Note (Signed)
Addended by: Gavin Potters R on: 10/20/2021 01:38 PM   Modules accepted: Orders

## 2021-10-20 NOTE — Progress Notes (Signed)
Subjective:    Patient ID: Kelli Cruz, female    DOB: [birth year], 61 y.o.   MRN: [id number]  HPI 61 year old woman, never smoker with a history of sigmoid colon cancer, diabetes, hypertension, hyperlipidemia, hypothyroidism, bipolar with anxiety/depression.  She is referred today for evaluation of chronic cough. She reports that she began to experience cough in 04/2021. She was well until she had a procedure to change stimulator battery in March, remembers that she had a very dry mouth and throat, evolved a sore throat. She got a little improvement when she stopped the lisinopril > metoprolol. She does a lot of throat clearing. Was on pantoprazole, famotidine was added - now stopped. Occasionally productive, but not always. Still coughs every day. Sometimes cold air causes. Sometimes loses her voice, coughs w a lot of talking.    Review of Systems As per HPI  Past Medical History:  Diagnosis Date   Anemia    Anxiety    Arthritis    Bipolar 1 disorder ([place])    Cancer ([place])    sigmoid colon cancer   Depression    Diabetes mellitus    type II   Endometriosis    Family history of malignant neoplasm of gastrointestinal tract    Ganglion cyst    GERD (gastroesophageal reflux disease)    Hyperlipidemia    Hypothyroidism    Insomnia due to mental condition      Family History  Problem Relation Age of Onset   Breast cancer Mother    Diabetes Mother    Coronary artery disease Mother    Kidney disease Mother        renal insufficiency   Hyperlipidemia Mother    Diabetes Father    Coronary artery disease Father    Colon cancer Father    Colon cancer Paternal Grandmother    Breast cancer Maternal Aunt    Breast cancer Paternal Aunt    Esophageal cancer Neg Hx    Rectal cancer Neg Hx    Stomach cancer Neg Hx      Social History   Socioeconomic History   Marital status: Married    Spouse name: Not on file   Number of children: 0   Years of education: Not on file    Highest education level: Not on file  Occupational History   Occupation: Enviroment health & Solicitor    Comment: [employer]   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   Tobacco Use   Smoking status: Never    Passive exposure: Yes   Smokeless tobacco: Never  Vaping Use   Vaping Use: Never used  Substance and Sexual Activity   Alcohol use: Yes    Alcohol/week: 0.0 standard drinks of alcohol    Comment: rare   Drug use: No   Sexual activity: Not Currently  Other Topics Concern   Not on file  Social History Narrative   Daily caffeine use   Social Determinants of Health   Financial Resource Strain: Not on file  Food Insecurity: Not on file  Transportation Needs: Not on file  Physical Activity: Not on file  Stress: Not on file  Social Connections: Not on file  Intimate Partner Violence: Not on file     Allergies  Allergen Reactions   Keflex [Cephalexin] Nausea And Vomiting and Other (See Comments)    Light-headed/dizziness/weakness   Mirabegron Other (See Comments)    Trouble breathing and swollen tongue     Outpatient Medications Prior to Visit  Medication Sig Dispense Refill   ALPRAZolam (XANAX) 0.5 MG tablet Take 0.5 mg by mouth at bedtime as needed for anxiety (panic attack).      amphetamine-dextroamphetamine (ADDERALL XR) 30 MG 24 hr capsule Take 30 mg by mouth daily at 6 (six) AM.      amphetamine-dextroamphetamine (ADDERALL) 15 MG tablet Take 15 mg by mouth daily at 2 PM.      aspirin EC 81 MG tablet Take 1 tablet (81 mg total) by mouth daily. Swallow whole. 30 tablet 11   Blood Glucose Monitoring Suppl (ONETOUCH VERIO FLEX SYSTEM) w/Device KIT USE TO CHECK BLOOD SUGAR TWICE A DAY (DX. E11.9) 1 kit 0   cariprazine (VRAYLAR) capsule Take 3 mg by mouth every 3 (three) days.      Cholecalciferol (VITAMIN D3) 125 MCG (5000 UT) CAPS Take 5,000 Units by mouth daily.     citalopram (CELEXA) 40 MG tablet Take 40 mg by mouth at bedtime.      cyanocobalamin (,VITAMIN  B-12,) 1000 MCG/ML injection Inject 1 mL (1,000 mcg total) into the muscle every 3 (three) months. every 8 weeks 1 mL 3   Cyanocobalamin (VITAMIN B-12) 5000 MCG TBDP Take 5,000 mcg by mouth daily.     doxepin (SINEQUAN) 50 MG capsule Take 50 mg by mouth at bedtime.   1   empagliflozin (JARDIANCE) 25 MG TABS tablet Take 1 tablet (25 mg total) by mouth daily before breakfast. 90 tablet 3   ferrous sulfate 325 (65 FE) MG tablet Take 325 mg by mouth daily with breakfast.     gabapentin (NEURONTIN) 300 MG capsule TAKE 1 CAPSULE TWICE DAILY 180 capsule 1   glipiZIDE (GLUCOTROL XL) 10 MG 24 hr tablet Take 1 tablet (10 mg total) by mouth daily with breakfast. 90 tablet 3   Lancets (ONETOUCH DELICA PLUS [id number]) MISC USE TO CHECK BLOOD SUGAR TWICE A DAY 200 each 3   metFORMIN (GLUCOPHAGE) 1000 MG tablet TAKE 1/2 TABLET TWICE A DAYWITH MEALS 90 tablet 0   metoprolol succinate (TOPROL XL) 25 MG 24 hr tablet TAKE 1 TABLET DAILY. 90 tablet 3   ONETOUCH VERIO test strip USE TO CHECK SUGAR TWICE A DAY 200 strip 1   pantoprazole (PROTONIX) 40 MG tablet Take 1 tablet (40 mg total) by mouth every morning. 90 tablet 3   SYNTHROID 125 MCG tablet TAKE 1 TABLET DAILY 90 tablet 1   trimethoprim (TRIMPEX) 100 MG tablet Take 100 mg by mouth daily.     fluticasone (FLONASE) 50 MCG/ACT nasal spray Place 2 sprays into both nostrils daily. (Patient not taking: Reported on 10/20/2021)     No facility-administered medications prior to visit.        Objective:   Physical Exam  Vitals:   10/20/21 1122  BP: 136/78  Pulse: (!) 56  Temp: 98.4 F (36.9 C)  TempSrc: Oral  SpO2: 96%  Weight: 213 lb 3.2 oz (96.7 kg)  Height: 5' 2.5" (1.588 m)   Gen: Pleasant, overweight woman, in no distress,  normal affect  ENT: No lesions,  mouth clear,  oropharynx clear, no postnasal drip  Neck: No JVD, no stridor  Lungs: No use of accessory muscles, no crackles or wheezing on normal respiration, no wheeze on forced  expiration  Cardiovascular: RRR, heart sounds normal, no murmur or gallops, no peripheral edema  Musculoskeletal: No deformities, no cyanosis or clubbing  Neuro: alert, awake, non focal  Skin: Warm, no lesions or rash      Assessment &  Plan:  Chronic cough Chronic cough now for about 6 months.  Sounds principally upper airway in nature, dates back to when she had a procedure to change a stimulator battery.  She does not believe she was intubated but notes that she had very dry throat, dry mouth in the perioperative period.  We will try to treat the sustaining factors including GERD, allergic rhinitis more aggressively (some of this has already been tried).  We will also try to suppress her cough and avoid throat clearing to allow her upper airway to settle > Delsym during the day, Hycodan at night.  If she continues to have cough then consider upper airway inspection and evaluation for eosinophilia, consider PFT.   We will temporarily increase your pantoprazole to 40 mg twice a day.  Take this medication 1 hour around food.  Continue this dosing until we follow-up Please start loratadine 10 mg once daily (generic Claritin) Try to avoid throat clearing.  It may be helpful for you to use sugar-free candy or not methylated cough drop.  Keep this in your mouth and when you have the urge to clear your throat just swallow instead. We will try to suppress your cough to minimize throat irritation.  Try using Delsym as directed during the daytime.  I will send a prescription for Hycodan cough syrup.  You can use 5 cc up to every 6 hours if needed for cough suppression.  This can make you drowsy so you will probably want to use it principally at night. Depending on how your cough improves we will consider pulmonary function testing or possible airway inspection by bronchoscopy in the future. Follow with Dr. Lamonte Sakai in about 2 months to review your status.   Baltazar Apo, MD, PhD 10/20/2021, 11:59  AM [facility] [phone] or if no answer before 7:00PM call [phone] For any issues after 7:00PM please call eLink [phone]

## 2021-10-20 NOTE — Patient Instructions (Addendum)
We will temporarily increase your pantoprazole to 40 mg twice a day.  Take this medication 1 hour around food.  Continue this dosing until we follow-up Please start loratadine 10 mg once daily (generic Claritin) Try to avoid throat clearing.  It may be helpful for you to use sugar-free candy or not methylated cough drop.  Keep this in your mouth and when you have the urge to clear your throat just swallow instead. We will try to suppress your cough to minimize throat irritation.  Try using Delsym as directed during the daytime.  I will send a prescription for Hycodan cough syrup.  You can use 5 cc up to every 6 hours if needed for cough suppression.  This can make you drowsy so you will probably want to use it principally at night. Depending on how your cough improves we will consider pulmonary function testing or possible airway inspection by bronchoscopy in the future. Follow with Dr. Lamonte Sakai in about 2 months to review your status.

## 2021-10-27 ENCOUNTER — Institutional Professional Consult (permissible substitution): Payer: 59 | Admitting: Emergency Medicine

## 2021-11-13 NOTE — Progress Notes (Unsigned)
Cardiology Office Note:    Date:  11/14/2021   ID:  Kelli Cruz, DOB 11-14-1960, MRN [id number]  PCP:  Abner Greenspan, MD   [facility] Providers Cardiologist:  Lenna Sciara, MD Referring MD: Abner Greenspan, MD   Chief Complaint/Reason for Referral: Palpitations  ASSESSMENT:    1. Palpitations   2. Controlled type 2 diabetes mellitus without complication, without long-term current use of insulin ([place])   3. Hypertension associated with diabetes ([place])   4. Hyperlipidemia associated with type 2 diabetes mellitus ([place])   5. BMI 38.0-38.9,adult   6. Stage 3 chronic kidney disease, unspecified whether stage 3a or 3b CKD ([place])   7. Aortic atherosclerosis ([place])   8. PFO (patent foramen ovale)   9. Snoring      PLAN:    In order of problems listed above: 1.  Palpitations:  Reassuring cardiac evaluation thus far.  Monitor for now. 2.  T2DM:  Continue ASA, Jardiace, and atorvastatin; start losartan 21m Qday and check BMP next week. 3.  Hypertension: See number 2 above. 4.  Hyperlipidemia: Check lipid panel, LFTs, and Lp(a) next week. 5.  Elevated BMI: Refer to pharmacy. 6.  CKD: Start losartan for renal protection; check BMP. 7.  Aortic atherosclerosis: ASA, statin, and strict BP control. 8.  PFO:  Not clinically significant. 9.  Snoring: We will obtain a sleep study.  Given her fatigue we will decrease her Toprol to 12.5 mg.   Dispo:  Return in about 1 year (around 11/15/2022).      Medication Adjustments/Labs and Tests Ordered: Current medicines are reviewed at length with the patient today.  Concerns regarding medicines are outlined above.  The following changes have been made:     Labs/tests ordered: Orders Placed This Encounter  Procedures   Comprehensive metabolic panel   Lipoprotein A (LPA)   Lipid panel   Hepatic function panel   AMB Referral to [facility] Pharm-D   Split night study    Medication Changes: Meds ordered this encounter  Medications    metoprolol succinate (TOPROL XL) 25 MG 24 hr tablet    Sig: Take 0.5 tablets (12.5 mg total) by mouth daily.    Dispense:  45 tablet    Refill:  90    Dose decrease   losartan (COZAAR) 25 MG tablet    Sig: Take 1 tablet (25 mg total) by mouth daily.    Dispense:  90 tablet    Refill:  3     Current medicines are reviewed at length with the patient today.  The patient does not have concerns regarding medicines.   History of Present Illness:    FOCUSED PROBLEM LIST:   1.  Type 2 diabetes on metformin 2.  Hypertension 3.  Hyperlipidemia 4.  BMI of 38 5.  Aortic atherosclerosis on CT 2022 6.  PFO without history of CVA 7.  Cough due to ACEi  April 2023 consultation: The patient is a 61y.o. female with the indicated medical history here for recommendations regarding palpitations.  The patient was seen by her primary care provider recently.  She reported an irregular heartbeat.  An EKG was done which demonstrated sinus rhythm with occasional PVCs.  She denies any palpitations.  When she is on a blood pressure monitor she notes an irregular heartbeat.  She denies any shortness of breath.  She occasionally gets chest pain when she walks at work.  She does a good deal of walking.  This does not happen  every time she walks.  She has occasionally gotten chest pain sitting down as well.  It does not seem to be related to p.o. intake.  She denies any significant shortness of breath, syncope, severe bleeding, paroxysmal nocturnal dyspnea, orthopnea.  She does have orthostatic symptoms when she goes from sitting to standing quickly.  She recently had an elective procedure to replace the battery on her bladder stimulator which was uncomplicated.  Plan: Monitor, echo, start ASA 12m and Jardiance 167m increase Lipitor to 4082mand obtain coronary CTA.  Today:  In the interim, she could not get a CTA so a Lexiscan was obtained with low risk findings.  Echocardiogram and monitor were also reassuring with  occasional SVT; she was started on Toprol XL.  She has noticed that after starting the Toprol she has been more fatigued.  She has been plagued with chronic fatigue for quite a while.  She does admit to snoring and has some daytime somnolence.  She had been evaluated for sleep apnea about 10 years ago.  Since that time she has gained about 20 to 30 pounds.  She denies any chest pain, severe dyspnea, presyncope, syncope, or severe edema.  Her palpitations are much improved on Toprol.  She fortunately has not required any emergency room visits or hospitalizations.  She is otherwise well.      Current Medications: Current Meds  Medication Sig   ALPRAZolam (XANAX) 0.5 MG tablet Take 0.5 mg by mouth at bedtime as needed for anxiety (panic attack).    amphetamine-dextroamphetamine (ADDERALL XR) 30 MG 24 hr capsule Take 30 mg by mouth daily at 6 (six) AM.    amphetamine-dextroamphetamine (ADDERALL) 15 MG tablet Take 15 mg by mouth daily at 2 PM.    aspirin EC 81 MG tablet Take 1 tablet (81 mg total) by mouth daily. Swallow whole.   Blood Glucose Monitoring Suppl (ONETOUCH VERIO FLEX SYSTEM) w/Device KIT USE TO CHECK BLOOD SUGAR TWICE A DAY (DX. E11.9)   cariprazine (VRAYLAR) capsule Take 3 mg by mouth every 3 (three) days.    Cholecalciferol (VITAMIN D3) 125 MCG (5000 UT) CAPS Take 5,000 Units by mouth daily.   citalopram (CELEXA) 40 MG tablet Take 40 mg by mouth at bedtime.    doxepin (SINEQUAN) 50 MG capsule Take 50 mg by mouth at bedtime.    empagliflozin (JARDIANCE) 25 MG TABS tablet Take 1 tablet (25 mg total) by mouth daily before breakfast.   ferrous sulfate 325 (65 FE) MG tablet Take 325 mg by mouth daily with breakfast.   fluticasone (FLONASE) 50 MCG/ACT nasal spray Place 2 sprays into both nostrils daily.   gabapentin (NEURONTIN) 300 MG capsule TAKE 1 CAPSULE TWICE DAILY   glipiZIDE (GLUCOTROL XL) 10 MG 24 hr tablet Take 1 tablet (10 mg total) by mouth daily with breakfast.   HYDROcodone  bit-homatropine (HYCODAN) 5-1.5 MG/5ML syrup Take 5 mLs by mouth every 6 (six) hours as needed for cough.   Lancets (ONETOUCH DELICA PLUS [id number] USE TO CHECK BLOOD SUGAR TWICE A DAY   loratadine (CLARITIN) 10 MG tablet Take 1 tablet (10 mg total) by mouth daily.   losartan (COZAAR) 25 MG tablet Take 1 tablet (25 mg total) by mouth daily.   metFORMIN (GLUCOPHAGE) 1000 MG tablet TAKE 1/2 TABLET TWICE A DAYWITH MEALS   metoprolol succinate (TOPROL XL) 25 MG 24 hr tablet Take 0.5 tablets (12.5 mg total) by mouth daily.   ONETOUCH VERIO test strip USE TO CHECK SUGAR TWICE A  DAY   pantoprazole (PROTONIX) 40 MG tablet Take 1 tablet (40 mg total) by mouth 2 (two) times daily.   SYNTHROID 125 MCG tablet TAKE 1 TABLET DAILY   trimethoprim (TRIMPEX) 100 MG tablet Take 100 mg by mouth daily.   [DISCONTINUED] metoprolol succinate (TOPROL XL) 25 MG 24 hr tablet TAKE 1 TABLET DAILY.     Allergies:    Ace inhibitors, Keflex [cephalexin], and Mirabegron   Social History:   Social History   Tobacco Use   Smoking status: Never    Passive exposure: Yes   Smokeless tobacco: Never  Vaping Use   Vaping Use: Never used  Substance Use Topics   Alcohol use: Yes    Alcohol/week: 0.0 standard drinks of alcohol    Comment: rare   Drug use: No     Family Hx: Family History  Problem Relation Age of Onset   Breast cancer Mother    Diabetes Mother    Coronary artery disease Mother    Kidney disease Mother        renal insufficiency   Hyperlipidemia Mother    Diabetes Father    Coronary artery disease Father    Colon cancer Father    Colon cancer Paternal Grandmother    Breast cancer Maternal Aunt    Breast cancer Paternal Aunt    Esophageal cancer Neg Hx    Rectal cancer Neg Hx    Stomach cancer Neg Hx      Review of Systems:   Please see the history of present illness.    All other systems reviewed and are negative.     EKGs/Labs/Other Test Reviewed:    EKG:   Previous ekg  performed that demonstrates sinus rhythm with occ PVCs  Prior CV studies:  Lexiscan 2023:   Findings are consistent with no ischemia. The study is low risk.   No ST deviation was noted. Arrhythmias during stress: frequent PVCs. Arrhythmias during recovery: frequent PVCs. The ECG was not diagnostic due to pharmacologic protocol.   LV perfusion is normal. There is no evidence of ischemia. There is no evidence of infarction.   Nuclear stress EF: 43 %. The left ventricular ejection fraction is moderately decreased (30-44%). End diastolic cavity size is normal. End systolic cavity size is normal.   Prior study not available for comparison.  Monitor 2023: 3 Supraventricular Tachycardia runs occurred, the run with the fastest interval lasting 11 beats with a max rate of 193 bpm (avg 165  bpm); the run with the fastest interval was also the longest.    Isolated SVEs were rare (<1.0%), SVE Couplets were rare (<1.0%), and SVE Triplets were rare (<1.0%). Isolated VEs were frequent (41.4%, [id number]), VE Couplets were rare (<1.0%, 107), and VE Triplets were rare (<1.0%, 7). Ventricular Bigeminy and Trigeminy were present.    Patient triggered events corresponded with normal sinus rhythm, ventricular bigeminy, ventricular trigeminy, supraventricular ectopic beats, and ventricular ectopic beats   No atrial fibrillation, ventricular tachyarrhythmias, or bradyarrhythmias were detected however a high burden of PVCs was seen.  TTE 2023:  1. Left ventricular ejection fraction, by estimation, is 60 to 65%. The  left ventricle has normal function. The left ventricle has no regional  wall motion abnormalities. Left ventricular diastolic parameters are  consistent with Grade I diastolic  dysfunction (impaired relaxation).   2. Right ventricular systolic function is normal. The right ventricular  size is normal.   3. No prolapse. The mitral valve is normal in structure. No evidence of  mitral valve regurgitation.  No evidence of mitral stenosis.   4. The aortic valve is normal in structure. Aortic valve regurgitation is  not visualized. No aortic stenosis is present.   5. The inferior vena cava is normal in size with greater than 50%  respiratory variability, suggesting right atrial pressure of 3 mmHg.   6. Evidence of atrial level shunting detected by color flow Doppler.  There is a moderately sized patent foramen ovale with predominantly left  to right shunting across the atrial septum.   Other studies Reviewed: Review of the additional studies/records demonstrates: CT 04-29-2020 of abdomen demonstrates mild aortic atherosclerosis  Recent Labs: 05/11/2021: ALT 11; BUN 23; Creatinine, Ser 1.43; Potassium 4.3; Sodium 139; TSH 1.06   Recent Lipid Panel Lab Results  Component Value Date/Time   CHOL 164 05/11/2021 04:28 PM   CHOL 167 05/10/2021 11:50 AM   TRIG 206.0 (H) 05/11/2021 04:28 PM   TRIG 218 (H) 05/10/2021 11:50 AM   HDL 35.70 (L) 05/11/2021 04:28 PM   LDLCALC 75 03/08/2018 08:37 AM   LDLDIRECT 104.0 05/11/2021 04:28 PM    Risk Assessment/Calculations:          Physical Exam:    VS:  BP 124/76   Pulse 68   Ht 5' 2.5" (1.588 m)   Wt 214 lb (97.1 kg)   LMP 07/15/2006   SpO2 98%   BMI 38.52 kg/m    Wt Readings from Last 3 Encounters:  11/14/21 214 lb (97.1 kg)  10/20/21 213 lb 3.2 oz (96.7 kg)  09/08/21 209 lb (94.8 kg)    GENERAL:  No apparent distress, AOx3 HEENT:  No carotid bruits, +2 carotid impulses, no scleral icterus CAR: RRR no murmurs, gallops, rubs, or thrills RES:  Clear to auscultation bilaterally ABD:  Soft, nontender, nondistended, positive bowel sounds x 4 VASC:  +2 radial pulses, +2 carotid pulses, palpable pedal pulses NEURO:  CN 2-12 grossly intact; motor and sensory grossly intact PSYCH:  No active depression or anxiety EXT:  No edema, ecchymosis, or cyanosis  Signed, Early Osmond, MD  11/14/2021 9:07 AM    [address], [place], [place]  [address] Phone: [phone]; Fax: [phone]   Note:  This document was prepared using Dragon voice recognition software and may include unintentional dictation errors.

## 2021-11-14 ENCOUNTER — Encounter: Payer: Self-pay | Admitting: Internal Medicine

## 2021-11-14 ENCOUNTER — Ambulatory Visit: Payer: 59 | Attending: Internal Medicine | Admitting: Internal Medicine

## 2021-11-14 ENCOUNTER — Other Ambulatory Visit: Payer: Self-pay | Admitting: Emergency Medicine

## 2021-11-14 VITALS — BP 124/76 | HR 68 | Ht 62.5 in | Wt 214.0 lb

## 2021-11-14 DIAGNOSIS — R002 Palpitations: Secondary | ICD-10-CM

## 2021-11-14 DIAGNOSIS — E785 Hyperlipidemia, unspecified: Secondary | ICD-10-CM

## 2021-11-14 DIAGNOSIS — E1159 Type 2 diabetes mellitus with other circulatory complications: Secondary | ICD-10-CM | POA: Diagnosis not present

## 2021-11-14 DIAGNOSIS — E119 Type 2 diabetes mellitus without complications: Secondary | ICD-10-CM

## 2021-11-14 DIAGNOSIS — N183 Chronic kidney disease, stage 3 unspecified: Secondary | ICD-10-CM

## 2021-11-14 DIAGNOSIS — Q2112 Patent foramen ovale: Secondary | ICD-10-CM

## 2021-11-14 DIAGNOSIS — I700 Atherosclerosis of aorta: Secondary | ICD-10-CM

## 2021-11-14 DIAGNOSIS — E1169 Type 2 diabetes mellitus with other specified complication: Secondary | ICD-10-CM | POA: Diagnosis not present

## 2021-11-14 DIAGNOSIS — R0683 Snoring: Secondary | ICD-10-CM

## 2021-11-14 DIAGNOSIS — Z6838 Body mass index (BMI) 38.0-38.9, adult: Secondary | ICD-10-CM

## 2021-11-14 DIAGNOSIS — I152 Hypertension secondary to endocrine disorders: Secondary | ICD-10-CM

## 2021-11-14 MED ORDER — LOSARTAN POTASSIUM 25 MG PO TABS: 25.0000 mg | ORAL_TABLET | Freq: Every day | ORAL | 3 refills | Status: DC

## 2021-11-14 MED ORDER — METOPROLOL SUCCINATE ER 25 MG PO TB24: 12.5000 mg | ORAL_TABLET | Freq: Every day | ORAL | 90 refills | Status: DC

## 2021-11-14 NOTE — Patient Instructions (Addendum)
Medication Instructions:  Your physician has recommended you make the following change in your medication:  1.) decrease metoprolol succinate (Toprol XL) to half tablet--12.5 mg daily 2.) start losartan 25 mg - take one tablet daily at bedtime  *If you need a refill on your cardiac medications before your next appointment, please call your pharmacy*   Lab Work: Please return in one week for blood work: cmet, lipids, liver, Lp(a)  If you have labs (blood work) drawn today and your tests are completely normal, you will receive your results only by: [place] (if you have MyChart) OR A paper copy in the mail If you have any lab test that is abnormal or we need to change your treatment, we will call you to review the results.   Testing/Procedures: Your physician has recommended that you have a sleep study. This test records several body functions during sleep, including: brain activity, eye movement, oxygen and carbon dioxide blood levels, heart rate and rhythm, breathing rate and rhythm, the flow of air through your mouth and nose, snoring, body muscle movements, and chest and belly movement.   Follow-Up: At [facility], you and your health needs are our priority.  As part of our continuing mission to provide you with exceptional heart care, we have created designated Provider Care Teams.  These Care Teams include your primary Cardiologist (physician) and Advanced Practice Providers (APPs -  Physician Assistants and Nurse Practitioners) who all work together to provide you with the care you need, when you need it.   Your next appointment:   12 month(s)  The format for your next appointment:   In Person  Provider:   Early Osmond, MD     Other Instructions You have been referred to our Clinical Pharmacist for elevated BMI   Important Information About Sugar

## 2021-11-24 ENCOUNTER — Encounter: Payer: Self-pay | Admitting: Internal Medicine

## 2021-11-25 ENCOUNTER — Other Ambulatory Visit: Payer: 59

## 2021-12-02 ENCOUNTER — Ambulatory Visit: Payer: 59 | Attending: Internal Medicine

## 2021-12-02 DIAGNOSIS — I152 Hypertension secondary to endocrine disorders: Secondary | ICD-10-CM

## 2021-12-02 DIAGNOSIS — E1159 Type 2 diabetes mellitus with other circulatory complications: Secondary | ICD-10-CM

## 2021-12-02 DIAGNOSIS — I700 Atherosclerosis of aorta: Secondary | ICD-10-CM

## 2021-12-02 DIAGNOSIS — E1169 Type 2 diabetes mellitus with other specified complication: Secondary | ICD-10-CM

## 2021-12-02 DIAGNOSIS — Z6838 Body mass index (BMI) 38.0-38.9, adult: Secondary | ICD-10-CM

## 2021-12-02 DIAGNOSIS — R002 Palpitations: Secondary | ICD-10-CM

## 2021-12-02 DIAGNOSIS — N183 Chronic kidney disease, stage 3 unspecified: Secondary | ICD-10-CM

## 2021-12-02 DIAGNOSIS — E119 Type 2 diabetes mellitus without complications: Secondary | ICD-10-CM

## 2021-12-02 DIAGNOSIS — R0683 Snoring: Secondary | ICD-10-CM

## 2021-12-02 DIAGNOSIS — Q2112 Patent foramen ovale: Secondary | ICD-10-CM

## 2021-12-03 ENCOUNTER — Encounter: Payer: Self-pay | Admitting: Emergency Medicine

## 2021-12-03 LAB — LIPID PANEL
Chol/HDL Ratio: 5.6 ratio — ABNORMAL HIGH (ref 0.0–4.4)
Cholesterol, Total: 208 mg/dL — ABNORMAL HIGH (ref 100–199)
HDL: 37 mg/dL — ABNORMAL LOW (ref >39)
LDL Chol Calc (NIH): 135 mg/dL — ABNORMAL HIGH (ref 0–99)
Triglycerides: 202 mg/dL — ABNORMAL HIGH (ref 0–149)
VLDL Cholesterol Cal: 36 mg/dL (ref 5–40)

## 2021-12-03 LAB — COMPREHENSIVE METABOLIC PANEL
ALT: 16 IU/L (ref 0–32)
AST: 17 IU/L (ref 0–40)
Albumin/Globulin Ratio: 1.4 (ref 1.2–2.2)
Albumin: 3.9 g/dL (ref 3.8–4.9)
Alkaline Phosphatase: 105 IU/L (ref 44–121)
BUN/Creatinine Ratio: 14 (ref 9–23)
BUN: 19 mg/dL (ref 6–24)
Bilirubin Total: 0.3 mg/dL (ref 0.0–1.2)
CO2: 23 mmol/L (ref 20–29)
Calcium: 9.5 mg/dL (ref 8.7–10.2)
Chloride: 106 mmol/L (ref 96–106)
Creatinine, Ser: 1.4 mg/dL — ABNORMAL HIGH (ref 0.57–1.00)
Globulin, Total: 2.7 g/dL (ref 1.5–4.5)
Glucose: 184 mg/dL — ABNORMAL HIGH (ref 70–99)
Potassium: 5.3 mmol/L — ABNORMAL HIGH (ref 3.5–5.2)
Sodium: 142 mmol/L (ref 134–144)
Total Protein: 6.6 g/dL (ref 6.0–8.5)
eGFR: 44 mL/min/{1.73_m2} — ABNORMAL LOW (ref >59)

## 2021-12-03 LAB — HEPATIC FUNCTION PANEL: Bilirubin, Direct: 0.12 mg/dL (ref 0.00–0.40)

## 2021-12-03 LAB — LIPOPROTEIN A (LPA): Lipoprotein (a): 23.8 nmol/L (ref <75.0)

## 2021-12-05 ENCOUNTER — Telehealth: Payer: Self-pay

## 2021-12-05 DIAGNOSIS — E1169 Type 2 diabetes mellitus with other specified complication: Secondary | ICD-10-CM

## 2021-12-05 MED ORDER — ATORVASTATIN CALCIUM 40 MG PO TABS: 40.0000 mg | ORAL_TABLET | Freq: Every day | ORAL | 3 refills | Status: DC

## 2021-12-05 NOTE — Telephone Encounter (Signed)
-----   Message from Early Osmond, MD sent at 12/04/2021  4:23 PM EDT ----- Have her increase her atorva to 40, lipid panel and LFTs in 2 months

## 2021-12-05 NOTE — Addendum Note (Signed)
Addended by: Mendel Ryder on: 12/05/2021 04:59 PM   Modules accepted: Orders

## 2021-12-07 ENCOUNTER — Other Ambulatory Visit: Payer: Self-pay | Admitting: Emergency Medicine

## 2021-12-09 ENCOUNTER — Ambulatory Visit: Payer: 59 | Admitting: Family Medicine

## 2021-12-09 ENCOUNTER — Telehealth: Payer: Self-pay | Admitting: Family Medicine

## 2021-12-09 MED ORDER — ONETOUCH VERIO VI STRP: ORAL_STRIP | 1 refills | Status: DC

## 2021-12-09 MED ORDER — ONETOUCH DELICA PLUS LANCET33G MISC: 1 refills | Status: DC

## 2021-12-09 NOTE — Telephone Encounter (Signed)
  Encourage patient to contact the pharmacy for refills or they can request refills through [facility]  Did the patient contact the pharmacy:  yes   LAST APPOINTMENT DATE: 09/08/21  NEXT APPOINTMENT DATE:12/30/21  MEDICATION:ONETOUCH VERIO test strip  Is the patient out of medication? yes  If not, how much is left?  Is this a 12 day supply:   [id number]:OUZHQUI [place], [place] (Ph: [phone])  Let patient know to contact pharmacy at the end of the day to make sure medication is ready.  Please notify patient to allow 48-72 hours to process

## 2021-12-20 ENCOUNTER — Ambulatory Visit (INDEPENDENT_AMBULATORY_CARE_PROVIDER_SITE_OTHER): Payer: 59 | Admitting: Emergency Medicine

## 2021-12-20 ENCOUNTER — Encounter: Payer: Self-pay | Admitting: Emergency Medicine

## 2021-12-20 VITALS — BP 126/74 | HR 77 | Temp 98.0°F | Ht 62.5 in | Wt 213.0 lb

## 2021-12-20 DIAGNOSIS — R053 Chronic cough: Secondary | ICD-10-CM

## 2021-12-20 NOTE — Progress Notes (Signed)
Subjective:    Patient ID: Kelli Cruz, female    DOB: 12-05-1960, 61 y.o.   MRN: [id number]  HPI 61 year old woman, never smoker with a history of sigmoid colon cancer, diabetes, hypertension, hyperlipidemia, hypothyroidism, bipolar with anxiety/depression.  She is referred today for evaluation of chronic cough. She reports that she began to experience cough in 04/2021. She was well until she had a procedure to change stimulator battery in March, remembers that she had a very dry mouth and throat, evolved a sore throat. She got a little improvement when she stopped the lisinopril > metoprolol. She does a lot of throat clearing. Was on pantoprazole, famotidine was added - now stopped. Occasionally productive, but not always. Still coughs every day. Sometimes cold air causes. Sometimes loses her voice, coughs w a lot of talking.   ROV 12/20/2021 --follow-up visit 61 year old woman with a history of sigmoid colon cancer, diabetes, hypertension, hypothyroidism, bipolar disease.  I saw her in September for chronic cough that had been going on for over 6 months.  I tried temporarily increasing her pantoprazole to twice daily, started loratadine and we talked about good cough hygiene.  Also tried cough suppression with Delsym and Hycodan.  She returns today to report that her cough is better. She does still feel a globus sensation but less. She has not needed the cough meds recently.    Review of Systems As per HPI  Past Medical History:  Diagnosis Date   Anemia    Anxiety    Arthritis    Bipolar 1 disorder ([place])    Cancer ([place])    sigmoid colon cancer   Depression    Diabetes mellitus    type II   Endometriosis    Family history of malignant neoplasm of gastrointestinal tract    Ganglion cyst    GERD (gastroesophageal reflux disease)    Hyperlipidemia    Hypothyroidism    Insomnia due to mental condition      Family History  Problem Relation Age of Onset   Breast cancer Mother     Diabetes Mother    Coronary artery disease Mother    Kidney disease Mother        renal insufficiency   Hyperlipidemia Mother    Diabetes Father    Coronary artery disease Father    Colon cancer Father    Colon cancer Paternal Grandmother    Breast cancer Maternal Aunt    Breast cancer Paternal Aunt    Esophageal cancer Neg Hx    Rectal cancer Neg Hx    Stomach cancer Neg Hx      Social History   Socioeconomic History   Marital status: Married    Spouse name: Not on file   Number of children: 0   Years of education: Not on file   Highest education level: Not on file  Occupational History   Occupation: Enviroment health & Solicitor    Comment: [employer]   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   Tobacco Use   Smoking status: Never    Passive exposure: Yes   Smokeless tobacco: Never  Vaping Use   Vaping Use: Never used  Substance and Sexual Activity   Alcohol use: Yes    Alcohol/week: 0.0 standard drinks of alcohol    Comment: rare   Drug use: No   Sexual activity: Not Currently  Other Topics Concern   Not on file  Social History Narrative   Daily caffeine use  Social Determinants of Health   Financial Resource Strain: Not on file  Food Insecurity: Not on file  Transportation Needs: Not on file  Physical Activity: Not on file  Stress: Not on file  Social Connections: Not on file  Intimate Partner Violence: Not on file     Allergies  Allergen Reactions   Ace Inhibitors Cough   Keflex [Cephalexin] Nausea And Vomiting and Other (See Comments)    Light-headed/dizziness/weakness   Mirabegron Other (See Comments)    Trouble breathing and swollen tongue     Outpatient Medications Prior to Visit  Medication Sig Dispense Refill   ALPRAZolam (XANAX) 0.5 MG tablet Take 0.5 mg by mouth at bedtime as needed for anxiety (panic attack).      amphetamine-dextroamphetamine (ADDERALL XR) 30 MG 24 hr capsule Take 30 mg by mouth daily at 6 (six) AM.       amphetamine-dextroamphetamine (ADDERALL) 15 MG tablet Take 15 mg by mouth daily at 2 PM.      aspirin EC 81 MG tablet Take 1 tablet (81 mg total) by mouth daily. Swallow whole. 30 tablet 11   atorvastatin (LIPITOR) 40 MG tablet Take 1 tablet (40 mg total) by mouth daily. 90 tablet 3   Blood Glucose Monitoring Suppl (ONETOUCH VERIO FLEX SYSTEM) w/Device KIT USE TO CHECK BLOOD SUGAR TWICE A DAY (DX. E11.9) 1 kit 0   cariprazine (VRAYLAR) capsule Take 3 mg by mouth every 3 (three) days.      Cholecalciferol (VITAMIN D3) 125 MCG (5000 UT) CAPS Take 5,000 Units by mouth daily.     citalopram (CELEXA) 40 MG tablet Take 40 mg by mouth at bedtime.      cyanocobalamin (,VITAMIN B-12,) 1000 MCG/ML injection Inject 1 mL (1,000 mcg total) into the muscle every 3 (three) months. every 8 weeks 1 mL 3   Cyanocobalamin (VITAMIN B-12) 5000 MCG TBDP Take 5,000 mcg by mouth daily.     doxepin (SINEQUAN) 50 MG capsule Take 50 mg by mouth at bedtime.   1   empagliflozin (JARDIANCE) 25 MG TABS tablet Take 1 tablet (25 mg total) by mouth daily before breakfast. 90 tablet 3   ferrous sulfate 325 (65 FE) MG tablet Take 325 mg by mouth daily with breakfast.     fluticasone (FLONASE) 50 MCG/ACT nasal spray Place 2 sprays into both nostrils daily.     gabapentin (NEURONTIN) 300 MG capsule TAKE 1 CAPSULE TWICE DAILY 180 capsule 1   glipiZIDE (GLUCOTROL XL) 10 MG 24 hr tablet Take 1 tablet (10 mg total) by mouth daily with breakfast. 90 tablet 3   glucose blood (ONETOUCH VERIO) test strip Use to check blood sugar twice daily 200 strip 1   HYDROcodone bit-homatropine (HYCODAN) 5-1.5 MG/5ML syrup Take 5 mLs by mouth every 6 (six) hours as needed for cough. 240 mL 0   Lancets (ONETOUCH DELICA PLUS [id number]) MISC USE TO CHECK BLOOD SUGAR TWICE A DAY 200 each 1   loratadine (CLARITIN) 10 MG tablet TAKE 1 TABLET BY MOUTH EVERY DAY 90 tablet 1   losartan (COZAAR) 25 MG tablet Take 1 tablet (25 mg total) by mouth daily. 90 tablet 3    metFORMIN (GLUCOPHAGE) 1000 MG tablet TAKE 1/2 TABLET TWICE A DAYWITH MEALS 90 tablet 0   metoprolol succinate (TOPROL XL) 25 MG 24 hr tablet Take 0.5 tablets (12.5 mg total) by mouth daily. 45 tablet 90   pantoprazole (PROTONIX) 40 MG tablet TAKE 1 TABLET BY MOUTH TWICE A DAY 60 tablet 1  SYNTHROID 125 MCG tablet TAKE 1 TABLET DAILY 90 tablet 1   trimethoprim (TRIMPEX) 100 MG tablet Take 100 mg by mouth daily.     No facility-administered medications prior to visit.        Objective:   Physical Exam  Vitals:   12/20/21 1610  BP: 126/74  Pulse: 77  Temp: 98 F (36.7 C)  TempSrc: Oral  SpO2: 98%  Weight: 213 lb (96.6 kg)  Height: 5' 2.5" (1.588 m)   Gen: Pleasant, overweight woman, in no distress,  normal affect  ENT: No lesions,  mouth clear,  oropharynx clear, no postnasal drip  Neck: No JVD, no stridor  Lungs: No use of accessory muscles, no crackles or wheezing on normal respiration, no wheeze on forced expiration  Cardiovascular: RRR, heart sounds normal, no murmur or gallops, no peripheral edema  Musculoskeletal: No deformities, no cyanosis or clubbing  Neuro: alert, awake, non focal  Skin: Warm, no lesions or rash      Assessment & Plan:  Chronic cough Improved but still has a globus sensation.  She is not requiring any cough suppression anymore.  Still taking the pantoprazole twice daily but off the loratadine.  Try decreasing your Protonix (pantoprazole) to once daily.  If your cough begins to flare with this change then consider going back to twice a day. You could also consider restarting loratadine 10 mg once daily if your cough flares. Please call our office if you are coughing worsens so that we can review.  At that time we may decide to pursue pulmonary function testing or possibly even bronchoscopy and airway inspection.   Baltazar Apo, MD, PhD 12/20/2021, 4:27 PM [facility] [phone] or if no answer before 7:00PM  call [phone] For any issues after 7:00PM please call eLink [phone]

## 2021-12-20 NOTE — Patient Instructions (Signed)
Try decreasing your Protonix (pantoprazole) to once daily.  If your cough begins to flare with this change then consider going back to twice a day. You could also consider restarting loratadine 10 mg once daily if your cough flares. Please call our office if you are coughing worsens so that we can review.  At that time we may decide to pursue pulmonary function testing or possibly even bronchoscopy and airway inspection.

## 2021-12-20 NOTE — Assessment & Plan Note (Signed)
Improved but still has a globus sensation.  She is not requiring any cough suppression anymore.  Still taking the pantoprazole twice daily but off the loratadine.  Try decreasing your Protonix (pantoprazole) to once daily.  If your cough begins to flare with this change then consider going back to twice a day. You could also consider restarting loratadine 10 mg once daily if your cough flares. Please call our office if you are coughing worsens so that we can review.  At that time we may decide to pursue pulmonary function testing or possibly even bronchoscopy and airway inspection.

## 2021-12-21 ENCOUNTER — Ambulatory Visit: Payer: 59 | Attending: Internal Medicine | Admitting: Pharmacist

## 2021-12-21 VITALS — Wt 210.8 lb

## 2021-12-21 DIAGNOSIS — E119 Type 2 diabetes mellitus without complications: Secondary | ICD-10-CM | POA: Diagnosis not present

## 2021-12-21 DIAGNOSIS — E785 Hyperlipidemia, unspecified: Secondary | ICD-10-CM

## 2021-12-21 DIAGNOSIS — E1169 Type 2 diabetes mellitus with other specified complication: Secondary | ICD-10-CM | POA: Diagnosis not present

## 2021-12-21 MED ORDER — ONETOUCH DELICA PLUS LANCET33G MISC: 1 refills | Status: AC

## 2021-12-21 MED ORDER — ONETOUCH VERIO VI STRP: ORAL_STRIP | 1 refills | Status: DC

## 2021-12-21 NOTE — Progress Notes (Unsigned)
Patient ID: Kelli Cruz                 DOB: [birth year]                    MRN: [id number]      HPI: Kelli Cruz is a 61 y.o. female patient referred to pharmacy clinic by Dr Ali Lowe to initiate weight loss therapy with GLP1-RA. PMH is significant for obesity, HTN, and T2DM. Most recent BMI 37.92.  Patient presents today in good spirits. A1c has increased recently to 8.3. Patient works Land at [employer] and has noticed an increase in sweating so she had been consuming multiple Gatorades a day not realizing how much sugar they contain.   Also on mood stabilizing medications such as Adderall and Vraylar which can affect weight. Other medications that affect weight are Synthroid and glipizide.   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 Spends about 50% of her time at work behind a computer and the other 50% walking the grounds.  Currently managed on metformin, glipizide, and Jardiance.  Labs: Lab Results  Component Value Date   HGBA1C 8.3 (A) 09/08/2021    Wt Readings from Last 1 Encounters:  12/21/21 210 lb 12.8 oz (95.6 kg)    BP Readings from Last 1 Encounters:  12/20/21 126/74   Pulse Readings from Last 1 Encounters:  12/20/21 77       Component Value Date/Time   CHOL 208 (H) 12/02/2021 0713   CHOL 167 05/10/2021 1150   TRIG 202 (H) 12/02/2021 0713   TRIG 218 (H) 05/10/2021 1150   HDL 37 (L) 12/02/2021 0713   CHOLHDL 5.6 (H) 12/02/2021 0713   CHOLHDL 5 05/11/2021 1628   VLDL 41.2 (H) 05/11/2021 1628   LDLCALC 135 (H) 12/02/2021 0713   LDLDIRECT 104.0 05/11/2021 1628    Past Medical History:  Diagnosis Date   Anemia    Anxiety    Arthritis    Bipolar 1 disorder (HCC)    Cancer ([place])    sigmoid colon cancer   Depression    Diabetes mellitus    type II   Endometriosis    Family history of malignant neoplasm of gastrointestinal tract    Ganglion cyst    GERD (gastroesophageal reflux disease)    Hyperlipidemia    Hypothyroidism    Insomnia due to  mental condition     Current Outpatient Medications on File Prior to Visit  Medication Sig Dispense Refill   ALPRAZolam (XANAX) 0.5 MG tablet Take 0.5 mg by mouth at bedtime as needed for anxiety (panic attack).      amphetamine-dextroamphetamine (ADDERALL XR) 30 MG 24 hr capsule Take 30 mg by mouth daily at 6 (six) AM.      amphetamine-dextroamphetamine (ADDERALL) 15 MG tablet Take 15 mg by mouth daily at 2 PM.      aspirin EC 81 MG tablet Take 1 tablet (81 mg total) by mouth daily. Swallow whole. 30 tablet 11   atorvastatin (LIPITOR) 40 MG tablet Take 1 tablet (40 mg total) by mouth daily. 90 tablet 3   benzonatate (TESSALON) 200 MG capsule TAKE 1 CAPSULE BY MOUTH THREE TIMES A DAY     Blood Glucose Monitoring Suppl (ONETOUCH VERIO FLEX SYSTEM) w/Device KIT USE TO CHECK BLOOD SUGAR TWICE A DAY (DX. E11.9) 1 kit 0   cariprazine (VRAYLAR) capsule Take 3 mg by mouth every 3 (three) days.      Cholecalciferol (VITAMIN D3) 125 MCG (5000 UT)  CAPS Take 5,000 Units by mouth daily.     citalopram (CELEXA) 40 MG tablet Take 40 mg by mouth at bedtime.      cyanocobalamin (,VITAMIN B-12,) 1000 MCG/ML injection Inject 1 mL (1,000 mcg total) into the muscle every 3 (three) months. every 8 weeks 1 mL 3   Cyanocobalamin (VITAMIN B-12) 5000 MCG TBDP Take 5,000 mcg by mouth daily.     doxepin (SINEQUAN) 50 MG capsule Take 50 mg by mouth at bedtime.   1   doxycycline (VIBRA-TABS) 100 MG tablet Take 1 tablet by mouth 2 (two) times daily.     empagliflozin (JARDIANCE) 25 MG TABS tablet Take 1 tablet (25 mg total) by mouth daily before breakfast. 90 tablet 3   famotidine (PEPCID) 20 MG tablet      ferrous sulfate 325 (65 FE) MG tablet Take 325 mg by mouth daily with breakfast.     fluticasone (FLONASE) 50 MCG/ACT nasal spray Place 2 sprays into both nostrils daily.     gabapentin (NEURONTIN) 300 MG capsule TAKE 1 CAPSULE TWICE DAILY 180 capsule 1   glipiZIDE (GLUCOTROL XL) 10 MG 24 hr tablet Take 1 tablet (10 mg  total) by mouth daily with breakfast. 90 tablet 3   HYDROcodone bit-homatropine (HYCODAN) 5-1.5 MG/5ML syrup Take 5 mLs by mouth every 6 (six) hours as needed for cough. 240 mL 0   loratadine (CLARITIN) 10 MG tablet TAKE 1 TABLET BY MOUTH EVERY DAY 90 tablet 1   losartan (COZAAR) 25 MG tablet Take 1 tablet (25 mg total) by mouth daily. 90 tablet 3   metFORMIN (GLUCOPHAGE) 1000 MG tablet TAKE 1/2 TABLET TWICE A DAYWITH MEALS 90 tablet 0   metoprolol succinate (TOPROL XL) 25 MG 24 hr tablet Take 0.5 tablets (12.5 mg total) by mouth daily. 45 tablet 90   pantoprazole (PROTONIX) 40 MG tablet TAKE 1 TABLET BY MOUTH TWICE A DAY 60 tablet 1   SYNTHROID 125 MCG tablet TAKE 1 TABLET DAILY 90 tablet 1   trimethoprim (TRIMPEX) 100 MG tablet Take 100 mg by mouth daily.     No current facility-administered medications on file prior to visit.    Allergies  Allergen Reactions   Ace Inhibitors Cough   Keflex [Cephalexin] Nausea And Vomiting and Other (See Comments)    Light-headed/dizziness/weakness   Mirabegron Other (See Comments)    Trouble breathing and swollen tongue     Assessment/Plan:  1. T2DM - Patient's A1c 8.3 which is above goal of <7.0%.  Explained pathophysiology of T2DM and insulin resistance.  Using Planning Healthy Meals handout, explained difference between vegetables, proteins, fats, and carbohydrates. Discussed plate method and the role of diet changes and exercise.   Patient currently on metofmrin, glipizde and [place]. Explained mechanism of action of medications and how glipizide can lead to weight gain. Will discontinue at this time and start Ozempic. Using demo pen, explained mechanism of action, storage, attaching needle, priming pen, and administration. Discussed possible adverse effects. Patient is not pregnant and has no history of thyroid cancers. Will start at 0.25m once weekly for 4 weeks and then increase to 0.531mweekly. Patient voiced understanding.  Continue  metformin 50045mID Stop glipizide 14m18mily Start Ozempic 0.25mg28mkly for 4 weeks  ChrisKarren CobblermD, BCACP, CDCESReddick CPort Townsend [id number] [id number]: [phone]: [phone] 2509/2023 8:57 AM

## 2021-12-21 NOTE — Patient Instructions (Addendum)
It was nice meeting you today  We would like your A1c to be less than 7.0%  Please continue your metformin '1000mg'$  1/2 tablet twice a day  You can discontinue your glipizide at this time  We will start a medication called Ozempic.  Your dose will be 0.'25mg'$  once a week for 4 weeks and then increase to 0.'5mg'$ .  Focus on eating more vegetables, proteins, healthy fats, and whole grains.  Increase your water consumption  Try to get at least 30 minutes of exercise a day, at least 5 days a week  You can go to http://www.chan.com/ and download the copay card  Please message me with any questions  Karren Cobble, PharmD, BCACP, [place], [place] [address], [place], [place] [address] Phone: [phone]; Fax: [phone] 12/21/2021 4:08 PM

## 2021-12-22 ENCOUNTER — Encounter: Payer: Self-pay | Admitting: Pharmacist

## 2021-12-22 ENCOUNTER — Telehealth: Payer: Self-pay | Admitting: Pharmacist

## 2021-12-22 DIAGNOSIS — E119 Type 2 diabetes mellitus without complications: Secondary | ICD-10-CM

## 2021-12-22 DIAGNOSIS — E1169 Type 2 diabetes mellitus with other specified complication: Secondary | ICD-10-CM

## 2021-12-22 MED ORDER — OZEMPIC (0.25 OR 0.5 MG/DOSE) 2 MG/3ML ~~LOC~~ SOPN: PEN_INJECTOR | SUBCUTANEOUS | 1 refills | Status: DC

## 2021-12-22 NOTE — Telephone Encounter (Signed)
PA for Ozempic submitted.  Key: [id number]

## 2021-12-22 NOTE — Addendum Note (Signed)
Addended by: Rollen Sox on: 12/22/2021 12:50 PM   Modules accepted: Orders

## 2021-12-22 NOTE — Telephone Encounter (Signed)
PA for Ozempic approved.

## 2021-12-30 ENCOUNTER — Ambulatory Visit (INDEPENDENT_AMBULATORY_CARE_PROVIDER_SITE_OTHER): Payer: 59 | Admitting: Family Medicine

## 2021-12-30 ENCOUNTER — Encounter: Payer: Self-pay | Admitting: Family Medicine

## 2021-12-30 VITALS — BP 132/60 | HR 98 | Temp 97.8°F | Ht 62.5 in | Wt 207.6 lb

## 2021-12-30 DIAGNOSIS — E119 Type 2 diabetes mellitus without complications: Secondary | ICD-10-CM

## 2021-12-30 DIAGNOSIS — E1169 Type 2 diabetes mellitus with other specified complication: Secondary | ICD-10-CM

## 2021-12-30 DIAGNOSIS — I10 Essential (primary) hypertension: Secondary | ICD-10-CM | POA: Diagnosis not present

## 2021-12-30 DIAGNOSIS — E669 Obesity, unspecified: Secondary | ICD-10-CM

## 2021-12-30 DIAGNOSIS — Z6837 Body mass index (BMI) 37.0-37.9, adult: Secondary | ICD-10-CM

## 2021-12-30 LAB — POCT GLYCOSYLATED HEMOGLOBIN (HGB A1C)
HbA1c POC (<> result, manual entry): 9.8 % (ref 4.0–5.6)
HbA1c, POC (controlled diabetic range): 9.8 % — AB (ref 0.0–7.0)
HbA1c, POC (prediabetic range): 9.8 % — AB (ref 5.7–6.4)
Hemoglobin A1C: 9.8 % — AB (ref 4.0–5.6)

## 2021-12-30 NOTE — Patient Instructions (Signed)
Keep working on diet and exercise   Make an appt with your kidney doctor for a year follow up   Drink you water   Follow up in 3 months

## 2021-12-30 NOTE — Progress Notes (Unsigned)
Subjective:    Patient ID: Kelli Cruz, female    DOB: 1960/10/20, 61 y.o.   MRN: [id number]  HPI Pt presents for 3 month f/u of DM and chronic health problems  Wt Readings from Last 3 Encounters:  12/30/21 207 lb 9.6 oz (94.2 kg)  12/21/21 210 lb 12.8 oz (95.6 kg)  12/20/21 213 lb (96.6 kg)   37.37 kg/m   HTN  BP Readings from Last 3 Encounters:  12/30/21 132/60  12/20/21 126/74  11/14/21 124/76   Losartan 25 mg daily  Metoprolol xl 12.5 mg daily   Pulse Readings from Last 3 Encounters:  12/30/21 98  12/20/21 77  11/14/21 68     Lab Results  Component Value Date   CREATININE 1.40 (H) 12/02/2021   BUN 19 12/02/2021   NA 142 12/02/2021   K 5.3 (H) 12/02/2021   CL 106 12/02/2021   CO2 23 12/02/2021   Sees nephrology at [facility]  Trimethoprim daily to prevent uti  DM2 Lab Results  Component Value Date   HGBA1C 8.3 (A) 09/08/2021   Up this time Lab Results  Component Value Date   HGBA1C 9.8 (A) 12/30/2021   HGBA1C 9.8 12/30/2021   HGBA1C 9.8 (A) 12/30/2021   HGBA1C 9.8 (A) 12/30/2021     Metformin 500 mg bid  Glipizide xl 10 mg daily -holding to start ozempic  Jardiance 25  mg daily   Semaglutide 0.25 mg weekly -was started on this with her cardiology clinic pharm D Starts that tonight   Blood sugars have been around 200  Even when not eating that much  Watching her diet /doing better than she was   Drinking more water- making more water     Hyperlipidemia Lab Results  Component Value Date   CHOL 208 (H) 12/02/2021   HDL 37 (L) 12/02/2021   LDLCALC 135 (H) 12/02/2021   LDLDIRECT 104.0 05/11/2021   TRIG 202 (H) 12/02/2021   CHOLHDL 5.6 (H) 12/02/2021   Atorvastatin 10 mg daily -went up to 40 mg  Doing ok with that   Has a urethral caruncle ?  Does not bother her much -noted it when she had some itching   A little exercise -working on that    Patient Active Problem List   Diagnosis Date Noted   Ulceration, tongue  traumatic 09/08/2021   Chest wall pain 06/27/2021   Chronic cough 06/08/2021   Class 2 obesity due to excess calories with body mass index (BMI) of 37.0 to 37.9 in adult 05/11/2021   Irregular heart beat 05/11/2021   Loose stools 04/10/2020   History of total abdominal hysterectomy 01/22/2020   At high risk for breast cancer 01/22/2020   Hypertensive disorder 01/22/2020   Fatigue 09/12/2019   Leg cramps 08/15/2019   Chronic kidney disease, stage 3b ([place]) 02/03/2019   History of GI bleed 12/29/2018   Kidney stone 07/23/2018   Anemia 02/19/2017   Vitamin D deficiency 02/19/2017   Knee pain, right 11/10/2016   Proteinuria 02/18/2016   Exposure to communicable disease 08/04/2015   Hypersomnia with sleep apnea 01/06/2015   Insomnia due to mental condition    Tingling 08/12/2014   Burning sensation of mouth 08/12/2014   Numbness 10/24/2011   Routine general medical examination at a health care facility 10/24/2011   TMJ arthralgia 08/14/2011   Poor posture 07/26/2011   B12 deficiency 04/13/2010   Malignant tumor of colon ([place]) 08/11/2008   Hyperlipidemia associated with type 2 diabetes mellitus (  [place]) 07/08/2008   Adenocarcinoma of rectum ([place]) 02/14/2008   Hypothyroidism 09/12/2006   Diabetes type 2, controlled ([place]) 09/12/2006   Depression with anxiety 09/12/2006   ALLERGIC RHINITIS, SEASONAL 09/12/2006   FIBROCYSTIC BREAST DISEASE 09/12/2006   MIGRAINES, HX OF 09/12/2006   Past Medical History:  Diagnosis Date   Anemia    Anxiety    Arthritis    Bipolar 1 disorder ([place])    Cancer ([place])    sigmoid colon cancer   Depression    Diabetes mellitus    type II   Endometriosis    Family history of malignant neoplasm of gastrointestinal tract    Ganglion cyst    GERD (gastroesophageal reflux disease)    Hyperlipidemia    Hypothyroidism    Insomnia due to mental condition    Past Surgical History:  Procedure Laterality Date   ABDOMINAL HYSTERECTOMY     ABDOMINAL  HYSTERECTOMY     BIOPSY  12/31/2018   Procedure: BIOPSY;  Surgeon: Doran Stabler, MD;  Location: [facility] ENDOSCOPY;  Service: Gastroenterology;;   COLON RESECTION  June 2010   [place] AND CURETTAGE OF UTERUS  2006   miscarriage    ESOPHAGOGASTRODUODENOSCOPY N/A 12/31/2018   Procedure: ESOPHAGOGASTRODUODENOSCOPY (EGD);  Surgeon: Doran Stabler, MD;  Location: [facility];  Service: Gastroenterology;  Laterality: N/A;   GANGLION CYST EXCISION     HERNIA REPAIR     INTERSTIM IMPLANT PLACEMENT Left 12/11/2012   stimulator is on the left but the electrodes go to the right   [place]   D & C for endometriosis   Social History   Tobacco Use   Smoking status: Never    Passive exposure: Yes   Smokeless tobacco: Never  Vaping Use   Vaping Use: Never used  Substance Use Topics   Alcohol use: Yes    Alcohol/week: 0.0 standard drinks of alcohol    Comment: rare   Drug use: No   Family History  Problem Relation Age of Onset   Breast cancer Mother    Diabetes Mother    Coronary artery disease Mother    Kidney disease Mother        renal insufficiency   Hyperlipidemia Mother    Diabetes Father    Coronary artery disease Father    Colon cancer Father    Colon cancer Paternal Grandmother    Breast cancer Maternal Aunt    Breast cancer Paternal Aunt    Esophageal cancer Neg Hx    Rectal cancer Neg Hx    Stomach cancer Neg Hx    Allergies  Allergen Reactions   Ace Inhibitors Cough   Keflex [Cephalexin] Nausea And Vomiting and Other (See Comments)    Light-headed/dizziness/weakness   Mirabegron Other (See Comments)    Trouble breathing and swollen tongue   Current Outpatient Medications on File Prior to Visit  Medication Sig Dispense Refill   ALPRAZolam (XANAX) 0.5 MG tablet Take 0.5 mg by mouth at bedtime as needed for anxiety (panic attack).      amphetamine-dextroamphetamine (ADDERALL XR) 30 MG 24  hr capsule Take 30 mg by mouth daily at 6 (six) AM.      amphetamine-dextroamphetamine (ADDERALL) 15 MG tablet Take 15 mg by mouth daily at 2 PM.      aspirin EC 81 MG tablet Take 1 tablet (81 mg total) by mouth daily. Swallow  whole. 30 tablet 11   atorvastatin (LIPITOR) 40 MG tablet Take 1 tablet (40 mg total) by mouth daily. 90 tablet 3   Blood Glucose Monitoring Suppl (ONETOUCH VERIO FLEX SYSTEM) w/Device KIT USE TO CHECK BLOOD SUGAR TWICE A DAY (DX. E11.9) 1 kit 0   cariprazine (VRAYLAR) capsule Take 3 mg by mouth every 3 (three) days.      Cholecalciferol (VITAMIN D3) 125 MCG (5000 UT) CAPS Take 5,000 Units by mouth daily.     citalopram (CELEXA) 40 MG tablet Take 40 mg by mouth at bedtime.      cyanocobalamin (,VITAMIN B-12,) 1000 MCG/ML injection Inject 1 mL (1,000 mcg total) into the muscle every 3 (three) months. every 8 weeks 1 mL 3   Cyanocobalamin (VITAMIN B-12) 5000 MCG TBDP Take 5,000 mcg by mouth daily.     doxepin (SINEQUAN) 50 MG capsule Take 50 mg by mouth at bedtime.   1   doxycycline (VIBRA-TABS) 100 MG tablet Take 1 tablet by mouth 2 (two) times daily.     empagliflozin (JARDIANCE) 25 MG TABS tablet Take 1 tablet (25 mg total) by mouth daily before breakfast. 90 tablet 3   famotidine (PEPCID) 20 MG tablet      ferrous sulfate 325 (65 FE) MG tablet Take 325 mg by mouth daily with breakfast.     fluticasone (FLONASE) 50 MCG/ACT nasal spray Place 2 sprays into both nostrils daily.     gabapentin (NEURONTIN) 300 MG capsule TAKE 1 CAPSULE TWICE DAILY 180 capsule 1   glipiZIDE (GLUCOTROL XL) 10 MG 24 hr tablet Take 1 tablet (10 mg total) by mouth daily with breakfast. 90 tablet 3   glucose blood (ONETOUCH VERIO) test strip Use to check blood sugar twice daily 200 strip 1   HYDROcodone bit-homatropine (HYCODAN) 5-1.5 MG/5ML syrup Take 5 mLs by mouth every 6 (six) hours as needed for cough. 240 mL 0   Lancets (ONETOUCH DELICA PLUS [id number]) MISC USE TO CHECK BLOOD SUGAR TWICE A DAY  200 each 1   loratadine (CLARITIN) 10 MG tablet TAKE 1 TABLET BY MOUTH EVERY DAY 90 tablet 1   losartan (COZAAR) 25 MG tablet Take 1 tablet (25 mg total) by mouth daily. 90 tablet 3   metFORMIN (GLUCOPHAGE) 1000 MG tablet TAKE 1/2 TABLET TWICE A DAYWITH MEALS 90 tablet 0   metoprolol succinate (TOPROL XL) 25 MG 24 hr tablet Take 0.5 tablets (12.5 mg total) by mouth daily. 45 tablet 90   pantoprazole (PROTONIX) 40 MG tablet TAKE 1 TABLET BY MOUTH TWICE A DAY 60 tablet 1   Semaglutide,0.25 or 0.5MG/DOS, (OZEMPIC, 0.25 OR 0.5 MG/DOSE,) 2 MG/3ML SOPN Inject 0.28m once weekly for 4 weeks and then increase to 0.528monce weekly 3 mL 1   SYNTHROID 125 MCG tablet TAKE 1 TABLET DAILY 90 tablet 1   trimethoprim (TRIMPEX) 100 MG tablet Take 100 mg by mouth daily.     No current facility-administered medications on file prior to visit.    Review of Systems  Constitutional:  Negative for activity change, appetite change, fatigue, fever and unexpected weight change.  HENT:  Negative for congestion, ear pain, rhinorrhea, sinus pressure and sore throat.   Eyes:  Negative for pain, redness and visual disturbance.  Respiratory:  Negative for cough, shortness of breath and wheezing.   Cardiovascular:  Negative for chest pain and palpitations.  Gastrointestinal:  Negative for abdominal pain, blood in stool, constipation and diarrhea.  Endocrine: Negative for polydipsia and polyuria.  Genitourinary:  Negative for dysuria, frequency and urgency.  Musculoskeletal:  Negative for arthralgias, back pain and myalgias.  Skin:  Negative for pallor and rash.  Allergic/Immunologic: Negative for environmental allergies.  Neurological:  Negative for dizziness, syncope and headaches.  Hematological:  Negative for adenopathy. Does not bruise/bleed easily.  Psychiatric/Behavioral:  Negative for decreased concentration and dysphoric mood. The patient is not nervous/anxious.        Objective:   Physical  Exam Constitutional:      General: She is not in acute distress.    Appearance: Normal appearance. She is well-developed. She is obese. She is not ill-appearing or diaphoretic.  HENT:     Head: Normocephalic and atraumatic.  Eyes:     Conjunctiva/sclera: Conjunctivae normal.     Pupils: Pupils are equal, round, and reactive to light.  Neck:     Thyroid: No thyromegaly.     Vascular: No carotid bruit or JVD.  Cardiovascular:     Rate and Rhythm: Normal rate and regular rhythm.     Heart sounds: Normal heart sounds.     No gallop.  Pulmonary:     Effort: Pulmonary effort is normal. No respiratory distress.     Breath sounds: Normal breath sounds. No wheezing or rales.  Abdominal:     General: There is no distension or abdominal bruit.     Palpations: Abdomen is soft.  Musculoskeletal:     Cervical back: Normal range of motion and neck supple.     Right lower leg: No edema.     Left lower leg: No edema.  Lymphadenopathy:     Cervical: No cervical adenopathy.  Skin:    General: Skin is warm and dry.     Coloration: Skin is not pale.     Findings: No rash.  Neurological:     Mental Status: She is alert.     Coordination: Coordination normal.     Deep Tendon Reflexes: Reflexes are normal and symmetric. Reflexes normal.  Psychiatric:        Mood and Affect: Mood normal.           Assessment & Plan:   Problem List Items Addressed This Visit       Cardiovascular and Mediastinum   Hypertensive disorder    bp in fair control at this time  BP Readings from Last 1 Encounters:  12/30/21 132/60  No changes needed Most recent labs reviewed  Disc lifstyle change with low sodium diet and exercise  Plan to continue losartan 25 mg daily  Metoprolol xl 12.5 mg daily  Under care of cardiology and nephrology        Endocrine   Diabetes mellitus type 2 in obese ([place]) - Primary    A1c is up to 9.8 On low dose metformin (cannot inc due to ckd) Glipizide xl 10 mg daily- about  to hold and transition to semaglutide (under guidance of pharm D at cardiology clinic) Will start semaglutide 0.25 mg weekly and hope it will help with wt loss Diet is improved and drinking more water  Disc opt for exercise   Plan f/u in 3 mo         Other   Class 2 obesity due to excess calories with body mass index (BMI) of 37.0 to 37.9 in adult    Discussed how this problem influences overall health and the risks it imposes  Reviewed plan for weight loss with lower calorie diet (via better food choices and also portion control or program  like weight watchers) and exercise building up to or more than 30 minutes 5 days per week including some aerobic activity   About to start semaglutide for DM and this should help wt loss  Disc opt for exercise

## 2022-01-01 NOTE — Assessment & Plan Note (Signed)
bp in fair control at this time  BP Readings from Last 1 Encounters:  12/30/21 132/60   No changes needed Most recent labs reviewed  Disc lifstyle change with low sodium diet and exercise  Plan to continue losartan 25 mg daily  Metoprolol xl 12.5 mg daily  Under care of cardiology and nephrology

## 2022-01-01 NOTE — Assessment & Plan Note (Signed)
Discussed how this problem influences overall health and the risks it imposes  Reviewed plan for weight loss with lower calorie diet (via better food choices and also portion control or program like weight watchers) and exercise building up to or more than 30 minutes 5 days per week including some aerobic activity   About to start semaglutide for DM and this should help wt loss  Disc opt for exercise

## 2022-01-01 NOTE — Assessment & Plan Note (Signed)
A1c is up to 9.8 On low dose metformin (cannot inc due to ckd) Glipizide xl 10 mg daily- about to hold and transition to semaglutide (under guidance of pharm D at cardiology clinic) Will start semaglutide 0.25 mg weekly and hope it will help with wt loss Diet is improved and drinking more water  Disc opt for exercise   Plan f/u in 3 mo

## 2022-01-06 ENCOUNTER — Ambulatory Visit: Payer: 59 | Attending: Internal Medicine | Admitting: Cardiovascular Disease

## 2022-01-06 ENCOUNTER — Other Ambulatory Visit: Payer: Self-pay | Admitting: Family Medicine

## 2022-01-06 DIAGNOSIS — R0683 Snoring: Secondary | ICD-10-CM | POA: Diagnosis present

## 2022-01-06 DIAGNOSIS — Q2112 Patent foramen ovale: Secondary | ICD-10-CM

## 2022-01-06 DIAGNOSIS — E119 Type 2 diabetes mellitus without complications: Secondary | ICD-10-CM

## 2022-01-06 DIAGNOSIS — I152 Hypertension secondary to endocrine disorders: Secondary | ICD-10-CM

## 2022-01-06 DIAGNOSIS — N183 Chronic kidney disease, stage 3 unspecified: Secondary | ICD-10-CM

## 2022-01-06 DIAGNOSIS — G4733 Obstructive sleep apnea (adult) (pediatric): Secondary | ICD-10-CM | POA: Diagnosis not present

## 2022-01-06 DIAGNOSIS — R002 Palpitations: Secondary | ICD-10-CM

## 2022-01-06 DIAGNOSIS — E1159 Type 2 diabetes mellitus with other circulatory complications: Secondary | ICD-10-CM

## 2022-01-06 DIAGNOSIS — R5383 Other fatigue: Secondary | ICD-10-CM | POA: Insufficient documentation

## 2022-01-06 DIAGNOSIS — Z6838 Body mass index (BMI) 38.0-38.9, adult: Secondary | ICD-10-CM

## 2022-01-06 DIAGNOSIS — I700 Atherosclerosis of aorta: Secondary | ICD-10-CM

## 2022-01-06 DIAGNOSIS — E1169 Type 2 diabetes mellitus with other specified complication: Secondary | ICD-10-CM

## 2022-01-09 NOTE — Telephone Encounter (Signed)
Last OV was 12/30/21, next appt 04/03/22, gabapentin last filled on 07/12/21 #180 caps with 1 refill

## 2022-01-12 ENCOUNTER — Encounter: Payer: Self-pay | Admitting: Pharmacist

## 2022-01-12 NOTE — Telephone Encounter (Signed)
Concerns addressed  Discussed the appropriate injection techniques.  Discussed the titration schedule and effective dose for better BG control and weight management.  Patient reports no side effects  Reiterated importance of healthy small meals to tolerate dose titration schedule

## 2022-01-14 LAB — HM DEXA SCAN: HM Dexa Scan: NORMAL

## 2022-01-19 ENCOUNTER — Encounter: Payer: Self-pay | Admitting: Cardiovascular Disease

## 2022-01-19 NOTE — Procedures (Signed)
[facility]     Patient Name: Kelli Cruz, Kelli Cruz Date: 01/06/2022 Gender: Female D.O.B: [birth year] Age (years): 61 Referring Provider: Lenna Sciara Height (inches): 63 Interpreting Physician: Shelva Majestic MD, ABSM Weight (lbs): 207 RPSGT: Peak, Robert BMI: 37 MRN: [id number] Neck Size: <br>  CLINICAL INFORMATION Sleep Study Type: NPSG  Indication for sleep study: snoring, fatigue, palpitations  Epworth Sleepiness Score: 9  SLEEP STUDY TECHNIQUE As per the AASM Manual for the Scoring of Sleep and Associated Events v2.3 (April 2016) with a hypopnea requiring 4% desaturations.  The channels recorded and monitored were frontal, central and occipital EEG, electrooculogram (EOG), submentalis EMG (chin), nasal and oral airflow, thoracic and abdominal wall motion, anterior tibialis EMG, snore microphone, electrocardiogram, and pulse oximetry.  MEDICATIONS ALPRAZolam (XANAX) 0.5 MG table amphetamine-dextroamphetamine (ADDERALL XR) 30 MG 24 hr capsule amphetamine-dextroamphetamine (ADDERALL) 15 MG tablet aspirin EC 81 MG tablet atorvastatin (LIPITOR) 40 MG tablet Blood Glucose Monitoring Suppl (ONETOUCH VERIO FLEX SYSTEM) w/Device KIT cariprazine (VRAYLAR) capsule Cholecalciferol (VITAMIN D3) 125 MCG (5000 UT) CAPS citalopram (CELEXA) 40 MG tablet cyanocobalamin (,VITAMIN B-12,) 1000 MCG/ML injection Cyanocobalamin (VITAMIN B-12) 5000 MCG TBDP doxepin (SINEQUAN) 50 MG capsule doxycycline (VIBRA-TABS) 100 MG tablet empagliflozin (JARDIANCE) 25 MG TABS tablet famotidine (PEPCID) 20 MG tablet ferrous sulfate 325 (65 FE) MG tablet fluticasone (FLONASE) 50 MCG/ACT nasal spra gabapentin (NEURONTIN) 300 MG capsule glipiZIDE (GLUCOTROL XL) 10 MG 24 hr tablet glucose blood (ONETOUCH VERIO) test strip HYDROcodone bit-homatropine (HYCODAN) 5-1.5 MG/5ML syrup Lancets (ONETOUCH DELICA PLUS [id number]) MISC loratadine (CLARITIN) 10 MG tablet losartan (COZAAR) 25 MG  tablet metFORMIN (GLUCOPHAGE) 1000 MG tablet metoprolol succinate (TOPROL XL) 25 MG 24 hr tablet pantoprazole (PROTONIX) 40 MG tablet Semaglutide,0.25 or 0.5MG/DOS, (OZEMPIC, 0.25 OR 0.5 MG/DOSE,) 2 MG/3ML SOPN SYNTHROID 125 MCG tablet trimethoprim (TRIMPEX) 100 MG tablet Medications self-administered by patient taken the night of the study : N/A  SLEEP ARCHITECTURE The study was initiated at 9:37:22 PM and ended at 5:30:20 AM.  Sleep onset time was 6.2 minutes and the sleep efficiency was 87.1%. The total sleep time was 411.8 minutes.  Stage REM latency was 200.0 minutes.  The patient spent 9.47% of the night in stage N1 sleep, 75.59% in stage N2 sleep, 0.97% in stage N3 and 14% in REM.  Alpha intrusion was absent.  Supine sleep was 100.00%.  RESPIRATORY PARAMETERS The overall apnea/hypopnea index (AHI) was 8.5 per hour. The respiratory disturbance index was significantly elevated at 30.2/h  There were 28 total apneas, including 14 obstructive, 14 central and 0 mixed apneas. There were 30 hypopneas and 149 RERAs.  The AHI during Stage REM sleep was 12.5 per hour.  AHI while supine was 8.5 per hour.  The mean oxygen saturation was 94.02%. The minimum SpO2 during sleep was 85.00%.  Moderate snoring was noted during this study.  CARDIAC DATA The 2 lead EKG demonstrated sinus rhythm. The mean heart rate was 87.71 beats per minute. Other EKG findings include: None.  LEG MOVEMENT DATA The total PLMS were 0 with a resulting PLMS index of 0.00. Associated arousal with leg movement index was 0.0 .  IMPRESSIONS - Mild obstructive sleep apnea overall ( AHI8.5/h; with RDI 30.2/h). Sleep apnea was worse during REM sleep (AHI 12.5/h).  - No significant central sleep apnea occurred during this study (CAI  2/h). - Mild oxygen desaturation to a nadir of 85.00%. - The patient snored with moderate snoring volume. - No cardiac abnormalities were noted during this study. - Clinically  significant periodic limb movements did not occur during sleep. No significant associated arousals.  DIAGNOSIS - Obstructive Sleep Apnea (G47.33)  RECOMMENDATIONS - Therapeutic CPAP titration to determine optimal pressure required to alleviate sleep disordered breathing. If unable to obtain an in-lab titration, recommend Auto-PAP with EPR of 3 at 6 - 16 cm of water. - Effort should be made to optimize nasal and oropharyngeal patency. - Positional therapy avoiding supine position during sleep. - Avoid alcohol, sedatives and other CNS depressants that may worsen sleep apnea and disrupt normal sleep architecture. - Sleep hygiene should be reviewed to assess factors that may improve sleep quality. - Weight management (BMI 37) and regular exercise should be initiated or continued if appropriate.  [Electronically signed] 01/19/2022 06:21 PM  Shelva Majestic MD, [facility], [place], American Board of Sleep Medicine  NPI: [id number]  [place] PH: [phone]   FX: [phone] [place]

## 2022-01-22 ENCOUNTER — Encounter: Payer: Self-pay | Admitting: Pharmacist

## 2022-01-22 ENCOUNTER — Encounter: Payer: Self-pay | Admitting: Internal Medicine

## 2022-01-23 ENCOUNTER — Telehealth: Payer: Self-pay | Admitting: *Deleted

## 2022-01-23 NOTE — Telephone Encounter (Signed)
-----   Message from Troy Sine, MD sent at 01/19/2022  6:25 PM EST ----- Kelli Cruz, please notify patient of the results.  Can try for in lab titration, but can also try for AutoPap as prescribed.

## 2022-01-23 NOTE — Telephone Encounter (Signed)
Patient notified via My chart response. She is requesting to get CPAP by the end of the year due to having met her deductible. APAP order sent to [facility].

## 2022-02-08 ENCOUNTER — Ambulatory Visit: Payer: 59 | Attending: Internal Medicine

## 2022-02-08 DIAGNOSIS — E1169 Type 2 diabetes mellitus with other specified complication: Secondary | ICD-10-CM

## 2022-02-08 LAB — HEPATIC FUNCTION PANEL
ALT: 21 IU/L (ref 0–32)
AST: 28 IU/L (ref 0–40)
Albumin: 4 g/dL (ref 3.8–4.9)
Alkaline Phosphatase: 88 IU/L (ref 44–121)
Bilirubin Total: 0.3 mg/dL (ref 0.0–1.2)
Bilirubin, Direct: 0.1 mg/dL (ref 0.00–0.40)
Total Protein: 6.1 g/dL (ref 6.0–8.5)

## 2022-02-08 LAB — LIPID PANEL
Chol/HDL Ratio: 4.8 ratio — ABNORMAL HIGH (ref 0.0–4.4)
Cholesterol, Total: 171 mg/dL (ref 100–199)
HDL: 36 mg/dL — ABNORMAL LOW (ref >39)
LDL Chol Calc (NIH): 91 mg/dL (ref 0–99)
Triglycerides: 261 mg/dL — ABNORMAL HIGH (ref 0–149)
VLDL Cholesterol Cal: 44 mg/dL — ABNORMAL HIGH (ref 5–40)

## 2022-02-09 ENCOUNTER — Encounter: Payer: Self-pay | Admitting: Internal Medicine

## 2022-02-10 MED ORDER — ATORVASTATIN CALCIUM 40 MG PO TABS: 40.0000 mg | ORAL_TABLET | Freq: Every day | ORAL | 3 refills | Status: DC

## 2022-02-20 ENCOUNTER — Other Ambulatory Visit: Payer: Self-pay | Admitting: Internal Medicine

## 2022-02-20 DIAGNOSIS — E1169 Type 2 diabetes mellitus with other specified complication: Secondary | ICD-10-CM

## 2022-02-20 DIAGNOSIS — E119 Type 2 diabetes mellitus without complications: Secondary | ICD-10-CM

## 2022-02-20 MED ORDER — SEMAGLUTIDE (1 MG/DOSE) 4 MG/3ML ~~LOC~~ SOPN: 1.0000 mg | PEN_INJECTOR | SUBCUTANEOUS | 1 refills | Status: DC

## 2022-02-20 NOTE — Telephone Encounter (Signed)
Patient should be increasing to '1mg'$ . Mychart message sent

## 2022-02-21 ENCOUNTER — Encounter: Payer: Self-pay | Admitting: Pharmacist

## 2022-03-03 ENCOUNTER — Ambulatory Visit: Payer: 59 | Admitting: Family Medicine

## 2022-03-03 ENCOUNTER — Encounter: Payer: Self-pay | Admitting: Family Medicine

## 2022-03-03 VITALS — BP 132/76 | HR 92 | Temp 97.8°F | Ht 62.5 in | Wt 196.5 lb

## 2022-03-03 DIAGNOSIS — L989 Disorder of the skin and subcutaneous tissue, unspecified: Secondary | ICD-10-CM

## 2022-03-03 MED ORDER — KETOCONAZOLE 2 % EX CREA: 1.0000 | TOPICAL_CREAM | Freq: Every day | CUTANEOUS | 0 refills | Status: DC

## 2022-03-03 NOTE — Assessment & Plan Note (Signed)
Dime to nickel size skin lesion on R upper ankle/lower leg Round/symmetric and erythematous with a raised border  May have a nevus in the center (hard to tell if this or scab) This began with trauma on ankle strap of a shoe next mo  No pain/no imp with abx ointment  Given appearance disc poss if a fungal infection  Pt is diabetic  Will try ketoconazole daily (after cleaning and drying) Keep protected and dry  Update if not starting to improve in a week or if worsening    Also in diff is psoriasis or eczema (no past hx) or infection  Appearance would be atypical due to shape and raised border

## 2022-03-03 NOTE — Patient Instructions (Signed)
You may have a fungal spot from the injury (ringworm)   Keep clean with soap and water Dry really well   Use the ketoconazole cream once daily (after shower and drying)  If not starting to improve in 7 days (or if worse) let us know  You can take a picture and put it in mychart message also   If pain/ increased redness/swelling /drainage - let us know

## 2022-03-03 NOTE — Progress Notes (Signed)
Subjective:    Patient ID: Kelli Cruz, female    DOB: Jan 14, 1961, 62 y.o.   MRN: [id number]  HPI Pt presents with scrape of R shin   Wt Readings from Last 3 Encounters:  03/03/22 196 lb 8 oz (89.1 kg)  12/30/21 207 lb 9.6 oz (94.2 kg)  12/21/21 210 lb 12.8 oz (95.6 kg)   35.37 kg/m  Vitals:   03/03/22 1117  BP: 132/76  Pulse: 92  Temp: 97.8 F (36.6 C)  SpO2: 98%   Injured herself on 12/29  Worse sandals with a strap that scraped her ankle  Used abx cream but getting bigger   Not going away  Does not hurt  Raised and a little hard   No h/o insect bites or exp to ticks  Feels fine No fever   Patient Active Problem List   Diagnosis Date Noted   Skin lesion 03/03/2022   Ulceration, tongue traumatic 09/08/2021   Chest wall pain 06/27/2021   Chronic cough 06/08/2021   Class 2 obesity due to excess calories with body mass index (BMI) of 37.0 to 37.9 in adult 05/11/2021   Irregular heart beat 05/11/2021   Loose stools 04/10/2020   History of total abdominal hysterectomy 01/22/2020   At high risk for breast cancer 01/22/2020   Hypertensive disorder 01/22/2020   Fatigue 09/12/2019   Leg cramps 08/15/2019   Chronic kidney disease, stage 3b ([place]) 02/03/2019   History of GI bleed 12/29/2018   Kidney stone 07/23/2018   Anemia 02/19/2017   Vitamin D deficiency 02/19/2017   Knee pain, right 11/10/2016   Proteinuria 02/18/2016   Exposure to communicable disease 08/04/2015   Hypersomnia with sleep apnea 01/06/2015   Insomnia due to mental condition    Tingling 08/12/2014   Burning sensation of mouth 08/12/2014   Numbness 10/24/2011   Routine general medical examination at a health care facility 10/24/2011   TMJ arthralgia 08/14/2011   Poor posture 07/26/2011   B12 deficiency 04/13/2010   Malignant tumor of colon ([place]) 08/11/2008   Hyperlipidemia associated with type 2 diabetes mellitus ([place]) 07/08/2008   Adenocarcinoma of rectum ([place]) 02/14/2008    Hypothyroidism 09/12/2006   Diabetes mellitus type 2 in obese ([place]) 09/12/2006   Depression with anxiety 09/12/2006   ALLERGIC RHINITIS, SEASONAL 09/12/2006   FIBROCYSTIC BREAST DISEASE 09/12/2006   MIGRAINES, HX OF 09/12/2006   Past Medical History:  Diagnosis Date   Anemia    Anxiety    Arthritis    Bipolar 1 disorder ([place])    Cancer ([place])    sigmoid colon cancer   Depression    Diabetes mellitus    type II   Endometriosis    Family history of malignant neoplasm of gastrointestinal tract    Ganglion cyst    GERD (gastroesophageal reflux disease)    Hyperlipidemia    Hypothyroidism    Insomnia due to mental condition    Past Surgical History:  Procedure Laterality Date   ABDOMINAL HYSTERECTOMY     ABDOMINAL HYSTERECTOMY     BIOPSY  12/31/2018   Procedure: BIOPSY;  Surgeon: Doran Stabler, MD;  Location: [facility] ENDOSCOPY;  Service: Gastroenterology;;   COLON RESECTION  June 2010   COLON SURGERY     COLONOSCOPY     DILATION AND CURETTAGE OF UTERUS  2006   miscarriage    ESOPHAGOGASTRODUODENOSCOPY N/A 12/31/2018   Procedure: ESOPHAGOGASTRODUODENOSCOPY (EGD);  Surgeon: Doran Stabler, MD;  Location: [facility];  Service: Gastroenterology;  Laterality: N/A;   GANGLION CYST EXCISION     HERNIA REPAIR     INTERSTIM IMPLANT PLACEMENT Left 12/11/2012   stimulator is on the left but the electrodes go to the right   [place]   D & C for endometriosis   Social History   Tobacco Use   Smoking status: Never    Passive exposure: Yes   Smokeless tobacco: Never  Vaping Use   Vaping Use: Never used  Substance Use Topics   Alcohol use: Yes    Alcohol/week: 0.0 standard drinks of alcohol    Comment: rare   Drug use: No   Family History  Problem Relation Age of Onset   Breast cancer Mother    Diabetes Mother    Coronary artery disease Mother    Kidney disease Mother        renal insufficiency   Hyperlipidemia Mother     Diabetes Father    Coronary artery disease Father    Colon cancer Father    Colon cancer Paternal Grandmother    Breast cancer Maternal Aunt    Breast cancer Paternal Aunt    Esophageal cancer Neg Hx    Rectal cancer Neg Hx    Stomach cancer Neg Hx    Allergies  Allergen Reactions   Ace Inhibitors Cough   Keflex [Cephalexin] Nausea And Vomiting and Other (See Comments)    Light-headed/dizziness/weakness   Mirabegron Other (See Comments)    Trouble breathing and swollen tongue   Current Outpatient Medications on File Prior to Visit  Medication Sig Dispense Refill   ALPRAZolam (XANAX) 0.5 MG tablet Take 0.5 mg by mouth at bedtime as needed for anxiety (panic attack).      amphetamine-dextroamphetamine (ADDERALL XR) 30 MG 24 hr capsule Take 30 mg by mouth daily at 6 (six) AM.      amphetamine-dextroamphetamine (ADDERALL) 15 MG tablet Take 15 mg by mouth daily at 2 PM.      aspirin EC 81 MG tablet Take 1 tablet (81 mg total) by mouth daily. Swallow whole. 30 tablet 11   atorvastatin (LIPITOR) 40 MG tablet Take 1 tablet (40 mg total) by mouth daily. 90 tablet 3   Blood Glucose Monitoring Suppl (ONETOUCH VERIO FLEX SYSTEM) w/Device KIT USE TO CHECK BLOOD SUGAR TWICE A DAY (DX. E11.9) 1 kit 0   cariprazine (VRAYLAR) capsule Take 3 mg by mouth every 3 (three) days.      Cholecalciferol (VITAMIN D3) 125 MCG (5000 UT) CAPS Take 5,000 Units by mouth daily.     citalopram (CELEXA) 40 MG tablet Take 40 mg by mouth at bedtime.      cyanocobalamin (,VITAMIN B-12,) 1000 MCG/ML injection Inject 1 mL (1,000 mcg total) into the muscle every 3 (three) months. every 8 weeks 1 mL 3   Cyanocobalamin (VITAMIN B-12) 5000 MCG TBDP Take 5,000 mcg by mouth daily.     doxepin (SINEQUAN) 50 MG capsule Take 50 mg by mouth at bedtime.   1   doxycycline (VIBRA-TABS) 100 MG tablet Take 1 tablet by mouth 2 (two) times daily.     empagliflozin (JARDIANCE) 25 MG TABS tablet Take 1 tablet (25 mg total) by mouth daily  before breakfast. 90 tablet 3   famotidine (PEPCID) 20 MG tablet      ferrous sulfate 325 (65 FE) MG tablet Take 325 mg by mouth daily with breakfast.     fluticasone (FLONASE) 50 MCG/ACT nasal spray Place  2 sprays into both nostrils daily.     gabapentin (NEURONTIN) 300 MG capsule TAKE 1 CAPSULE TWICE DAILY 180 capsule 1   glipiZIDE (GLUCOTROL XL) 10 MG 24 hr tablet Take 1 tablet (10 mg total) by mouth daily with breakfast. 90 tablet 3   glucose blood (ONETOUCH VERIO) test strip Use to check blood sugar twice daily 200 strip 1   HYDROcodone bit-homatropine (HYCODAN) 5-1.5 MG/5ML syrup Take 5 mLs by mouth every 6 (six) hours as needed for cough. 240 mL 0   Lancets (ONETOUCH DELICA PLUS [id number]) MISC USE TO CHECK BLOOD SUGAR TWICE A DAY 200 each 1   loratadine (CLARITIN) 10 MG tablet TAKE 1 TABLET BY MOUTH EVERY DAY 90 tablet 1   losartan (COZAAR) 25 MG tablet Take 1 tablet (25 mg total) by mouth daily. 90 tablet 3   metFORMIN (GLUCOPHAGE) 1000 MG tablet TAKE 1/2 TABLET TWICE A DAYWITH MEALS 90 tablet 2   metoprolol succinate (TOPROL XL) 25 MG 24 hr tablet Take 0.5 tablets (12.5 mg total) by mouth daily. 45 tablet 90   pantoprazole (PROTONIX) 40 MG tablet TAKE 1 TABLET BY MOUTH TWICE A DAY 60 tablet 1   Semaglutide, 1 MG/DOSE, 4 MG/3ML SOPN Inject 1 mg into the skin once a week. 3 mL 1   SYNTHROID 125 MCG tablet TAKE 1 TABLET DAILY 90 tablet 1   trimethoprim (TRIMPEX) 100 MG tablet Take 100 mg by mouth daily.     No current facility-administered medications on file prior to visit.     Review of Systems  Constitutional:        Excited about 10 lb intentional wt loss  HENT:  Positive for sore throat.   Eyes:  Positive for itching.  Respiratory:  Negative for cough and shortness of breath.   Cardiovascular:  Negative for leg swelling.  Musculoskeletal:  Negative for arthralgias, back pain and joint swelling.  Skin:        Skin lesion  Neurological:  Negative for light-headedness and  numbness.  Hematological:  Negative for adenopathy. Does not bruise/bleed easily.       Objective:   Physical Exam Constitutional:      General: She is not in acute distress.    Appearance: Normal appearance. She is obese. She is not ill-appearing or diaphoretic.  Eyes:     General:        Right eye: No discharge.        Left eye: No discharge.     Conjunctiva/sclera: Conjunctivae normal.     Pupils: Pupils are equal, round, and reactive to light.  Cardiovascular:     Rate and Rhythm: Regular rhythm. Tachycardia present.     Pulses: Normal pulses.  Pulmonary:     Effort: Pulmonary effort is normal. No respiratory distress.  Lymphadenopathy:     Cervical: No cervical adenopathy.  Skin:    Comments: Dime to nickel size round area of raised erythema on R lower leg/upper ankle May have a brown nevus in the middle  Round Rough Raised boarder  Nontender No drainage or swelling  No satellite lesions   R great toe nail is thickened and discolored No skin changes on feet (no obv tinea)  Neurological:     Mental Status: She is alert.     Sensory: No sensory deficit.  Psychiatric:        Mood and Affect: Mood normal.              Assessment & Plan:  Problem List Items Addressed This Visit       Musculoskeletal and Integument   Skin lesion - Primary    Dime to nickel size skin lesion on R upper ankle/lower leg Round/symmetric and erythematous with a raised border  May have a nevus in the center (hard to tell if this or scab) This began with trauma on ankle strap of a shoe next mo  No pain/no imp with abx ointment  Given appearance disc poss if a fungal infection  Pt is diabetic  Will try ketoconazole daily (after cleaning and drying) Keep protected and dry  Update if not starting to improve in a week or if worsening    Also in diff is psoriasis or eczema (no past hx) or infection  Appearance would be atypical due to shape and raised border

## 2022-03-14 ENCOUNTER — Encounter: Payer: Self-pay | Admitting: Family Medicine

## 2022-03-23 ENCOUNTER — Ambulatory Visit: Payer: 59 | Admitting: Family Medicine

## 2022-03-23 ENCOUNTER — Encounter: Payer: Self-pay | Admitting: Family Medicine

## 2022-03-23 VITALS — BP 128/72 | HR 97 | Temp 97.3°F | Ht 62.5 in | Wt 194.0 lb

## 2022-03-23 DIAGNOSIS — N390 Urinary tract infection, site not specified: Secondary | ICD-10-CM | POA: Diagnosis not present

## 2022-03-23 DIAGNOSIS — L989 Disorder of the skin and subcutaneous tissue, unspecified: Secondary | ICD-10-CM

## 2022-03-23 MED ORDER — TRIAMCINOLONE ACETONIDE 0.1 % EX CREA: 1.0000 | TOPICAL_CREAM | Freq: Two times a day (BID) | CUTANEOUS | 0 refills | Status: DC

## 2022-03-23 NOTE — Assessment & Plan Note (Addendum)
Pt sees dr Lorra Hals in urology Reviewed most recent notes today Takes daily trimethoprim for proph  Has h/o stones   Today has lesion on R leg susp for fixed drug rxn Inst to temporarily hold trimethoprim and obs Inst to call urology for further adv re: replacement as needed

## 2022-03-23 NOTE — Progress Notes (Signed)
Subjective:    Patient ID: Kelli Cruz, female    DOB: 1960/10/05, 62 y.o.   MRN: [id number]  HPI Pt presents for f/u of skin lesion on R ankle   Wt Readings from Last 3 Encounters:  03/23/22 194 lb (88 kg)  03/03/22 196 lb 8 oz (89.1 kg)  12/30/21 207 lb 9.6 oz (94.2 kg)   34.92 kg/m    Per pt the area did improve but now edges are spreading  Inside looks pale but bigger  Some itching  Med is not working    Last visit we treated with ketoconazole for possible fungal infx (it had a raised border)  It did begin with trauma with shoe strap  On trimethoprim   Newest drug is ozempic -on it since nov  Patient Active Problem List   Diagnosis Date Noted   Skin lesion 03/03/2022   Ulceration, tongue traumatic 09/08/2021   Chest wall pain 06/27/2021   Chronic cough 06/08/2021   Class 2 obesity due to excess calories with body mass index (BMI) of 37.0 to 37.9 in adult 05/11/2021   Irregular heart beat 05/11/2021   Loose stools 04/10/2020   History of total abdominal hysterectomy 01/22/2020   At high risk for breast cancer 01/22/2020   Hypertensive disorder 01/22/2020   Fatigue 09/12/2019   Leg cramps 08/15/2019   Chronic kidney disease, stage 3b ([place]) 02/03/2019   History of GI bleed 12/29/2018   Kidney stone 07/23/2018   Anemia 02/19/2017   Vitamin D deficiency 02/19/2017   Knee pain, right 11/10/2016   Proteinuria 02/18/2016   Exposure to communicable disease 08/04/2015   Hypersomnia with sleep apnea 01/06/2015   Insomnia due to mental condition    Tingling 08/12/2014   Burning sensation of mouth 08/12/2014   Numbness 10/24/2011   Routine general medical examination at a health care facility 10/24/2011   TMJ arthralgia 08/14/2011   Poor posture 07/26/2011   B12 deficiency 04/13/2010   Malignant tumor of colon ([place]) 08/11/2008   Hyperlipidemia associated with type 2 diabetes mellitus ([place]) 07/08/2008   Adenocarcinoma of rectum ([place]) 02/14/2008    Hypothyroidism 09/12/2006   Diabetes mellitus type 2 in obese ([place]) 09/12/2006   Depression with anxiety 09/12/2006   ALLERGIC RHINITIS, SEASONAL 09/12/2006   FIBROCYSTIC BREAST DISEASE 09/12/2006   MIGRAINES, HX OF 09/12/2006   Past Medical History:  Diagnosis Date   Anemia    Anxiety    Arthritis    Bipolar 1 disorder ([place])    Cancer ([place])    sigmoid colon cancer   Depression    Diabetes mellitus    type II   Endometriosis    Family history of malignant neoplasm of gastrointestinal tract    Ganglion cyst    GERD (gastroesophageal reflux disease)    Hyperlipidemia    Hypothyroidism    Insomnia due to mental condition    Past Surgical History:  Procedure Laterality Date   ABDOMINAL HYSTERECTOMY     ABDOMINAL HYSTERECTOMY     BIOPSY  12/31/2018   Procedure: BIOPSY;  Surgeon: Doran Stabler, MD;  Location: [facility] ENDOSCOPY;  Service: Gastroenterology;;   COLON RESECTION  June 2010   COLON SURGERY     COLONOSCOPY     DILATION AND CURETTAGE OF UTERUS  2006   miscarriage    ESOPHAGOGASTRODUODENOSCOPY N/A 12/31/2018   Procedure: ESOPHAGOGASTRODUODENOSCOPY (EGD);  Surgeon: Doran Stabler, MD;  Location: [facility] [place];  Service: Gastroenterology;  Laterality: N/A;   GANGLION CYST  EXCISION     HERNIA REPAIR     INTERSTIM IMPLANT PLACEMENT Left 12/11/2012   stimulator is on the left but the electrodes go to the right   [place]   D & C for endometriosis   Social History   Tobacco Use   Smoking status: Never    Passive exposure: Yes   Smokeless tobacco: Never  Vaping Use   Vaping Use: Never used  Substance Use Topics   Alcohol use: Yes    Alcohol/week: 0.0 standard drinks of alcohol    Comment: rare   Drug use: No   Family History  Problem Relation Age of Onset   Breast cancer Mother    Diabetes Mother    Coronary artery disease Mother    Kidney disease Mother        renal insufficiency   Hyperlipidemia Mother     Diabetes Father    Coronary artery disease Father    Colon cancer Father    Colon cancer Paternal Grandmother    Breast cancer Maternal Aunt    Breast cancer Paternal Aunt    Esophageal cancer Neg Hx    Rectal cancer Neg Hx    Stomach cancer Neg Hx    Allergies  Allergen Reactions   Ace Inhibitors Cough   Keflex [Cephalexin] Nausea And Vomiting and Other (See Comments)    Light-headed/dizziness/weakness   Mirabegron Other (See Comments)    Trouble breathing and swollen tongue   Current Outpatient Medications on File Prior to Visit  Medication Sig Dispense Refill   ALPRAZolam (XANAX) 0.5 MG tablet Take 0.5 mg by mouth at bedtime as needed for anxiety (panic attack).      amphetamine-dextroamphetamine (ADDERALL XR) 30 MG 24 hr capsule Take 30 mg by mouth daily at 6 (six) AM.      amphetamine-dextroamphetamine (ADDERALL) 15 MG tablet Take 15 mg by mouth daily at 2 PM.      aspirin EC 81 MG tablet Take 1 tablet (81 mg total) by mouth daily. Swallow whole. 30 tablet 11   atorvastatin (LIPITOR) 40 MG tablet Take 1 tablet (40 mg total) by mouth daily. 90 tablet 3   Blood Glucose Monitoring Suppl (ONETOUCH VERIO FLEX SYSTEM) w/Device KIT USE TO CHECK BLOOD SUGAR TWICE A DAY (DX. E11.9) 1 kit 0   cariprazine (VRAYLAR) capsule Take 3 mg by mouth every 3 (three) days.      Cholecalciferol (VITAMIN D3) 125 MCG (5000 UT) CAPS Take 5,000 Units by mouth daily.     citalopram (CELEXA) 40 MG tablet Take 40 mg by mouth at bedtime.      cyanocobalamin (,VITAMIN B-12,) 1000 MCG/ML injection Inject 1 mL (1,000 mcg total) into the muscle every 3 (three) months. every 8 weeks 1 mL 3   Cyanocobalamin (VITAMIN B-12) 5000 MCG TBDP Take 5,000 mcg by mouth daily.     doxepin (SINEQUAN) 50 MG capsule Take 50 mg by mouth at bedtime.   1   empagliflozin (JARDIANCE) 25 MG TABS tablet Take 1 tablet (25 mg total) by mouth daily before breakfast. 90 tablet 3   famotidine (PEPCID) 20 MG tablet      ferrous sulfate 325  (65 FE) MG tablet Take 325 mg by mouth daily with breakfast.     fluticasone (FLONASE) 50 MCG/ACT nasal spray Place 2 sprays into both nostrils daily.     gabapentin (NEURONTIN) 300 MG capsule TAKE 1 CAPSULE TWICE DAILY 180 capsule 1  glipiZIDE (GLUCOTROL XL) 10 MG 24 hr tablet Take 1 tablet (10 mg total) by mouth daily with breakfast. 90 tablet 3   glucose blood (ONETOUCH VERIO) test strip Use to check blood sugar twice daily 200 strip 1   HYDROcodone bit-homatropine (HYCODAN) 5-1.5 MG/5ML syrup Take 5 mLs by mouth every 6 (six) hours as needed for cough. 240 mL 0   Lancets (ONETOUCH DELICA PLUS ENIDPO24M) MISC USE TO CHECK BLOOD SUGAR TWICE A DAY 200 each 1   loratadine (CLARITIN) 10 MG tablet TAKE 1 TABLET BY MOUTH EVERY DAY 90 tablet 1   losartan (COZAAR) 25 MG tablet Take 1 tablet (25 mg total) by mouth daily. 90 tablet 3   metFORMIN (GLUCOPHAGE) 1000 MG tablet TAKE 1/2 TABLET TWICE A DAYWITH MEALS 90 tablet 2   metoprolol succinate (TOPROL XL) 25 MG 24 hr tablet Take 0.5 tablets (12.5 mg total) by mouth daily. 45 tablet 90   pantoprazole (PROTONIX) 40 MG tablet TAKE 1 TABLET BY MOUTH TWICE A DAY 60 tablet 1   Semaglutide, 1 MG/DOSE, 4 MG/3ML SOPN Inject 1 mg into the skin once a week. 3 mL 1   SYNTHROID 125 MCG tablet TAKE 1 TABLET DAILY 90 tablet 1   trimethoprim (TRIMPEX) 100 MG tablet Take 100 mg by mouth daily. (Patient not taking: Reported on 03/23/2022)     No current facility-administered medications on file prior to visit.    Review of Systems  Constitutional:  Negative for activity change, appetite change, fatigue, fever and unexpected weight change.  HENT:  Negative for congestion, ear pain, rhinorrhea, sinus pressure and sore throat.   Eyes:  Negative for pain, redness and visual disturbance.  Respiratory:  Negative for cough, shortness of breath and wheezing.   Cardiovascular:  Negative for chest pain and palpitations.  Gastrointestinal:  Negative for abdominal pain, blood in  stool, constipation and diarrhea.  Endocrine: Negative for polydipsia and polyuria.  Genitourinary:  Negative for dysuria, frequency and urgency.  Musculoskeletal:  Negative for arthralgias, back pain and myalgias.  Skin:  Positive for rash. Negative for pallor and wound.  Allergic/Immunologic: Negative for environmental allergies.  Neurological:  Negative for dizziness, syncope and headaches.  Hematological:  Negative for adenopathy. Does not bruise/bleed easily.  Psychiatric/Behavioral:  Negative for decreased concentration and dysphoric mood. The patient is not nervous/anxious.        Objective:   Physical Exam Constitutional:      General: She is not in acute distress.    Appearance: Normal appearance. She is obese. She is not ill-appearing or diaphoretic.  Eyes:     General:        Right eye: No discharge.        Left eye: No discharge.     Conjunctiva/sclera: Conjunctivae normal.     Pupils: Pupils are equal, round, and reactive to light.  Cardiovascular:     Rate and Rhythm: Regular rhythm. Tachycardia present.  Pulmonary:     Effort: No respiratory distress.     Breath sounds: No wheezing.  Musculoskeletal:     Cervical back: Neck supple.  Lymphadenopathy:     Cervical: No cervical adenopathy.  Skin:    General: Skin is warm and dry.     Comments: Area on R anterior ankle/lower leg is larger with more central clearing  Raised edge with erythema  No tenderness Scale on edges and center     Neurological:     Mental Status: She is alert.     Sensory:  No sensory deficit.     Motor: No weakness.  Psychiatric:        Mood and Affect: Mood normal.           Assessment & Plan:    Problem List Items Addressed This Visit       Musculoskeletal and Integument   Skin lesion - Primary    This is more pale but larger (with ketoconazole tx)  No other areas involved Absolutely no h/o tick bite or outdoor time Disc poss of a fixed drug eruption  She takes  chronic trimethoprim Also on gabapentin  Nothing is new however (ozempic started in nov)  Inst to hold the trimethoprim and call urology re this (is for uti proph)  Stop ketoconazole Keep clean and dry  Try triamcinolone 0.1% bid  Update asap if worse Update if not imp in 1-2 wk  Consider derm ref if needed   Not a classic appearance for psoriasis or eczema due to clearing in center           Genitourinary   Frequent UTI    Pt sees dr Lorra Hals in urology Reviewed most recent notes today Takes daily trimethoprim for proph  Has h/o stones   Today has lesion on R leg susp for fixed drug rxn Inst to temporarily hold trimethoprim and obs Inst to call urology for further adv re: replacement as needed

## 2022-03-23 NOTE — Assessment & Plan Note (Addendum)
This is more pale but larger (with ketoconazole tx)  No other areas involved Absolutely no h/o tick bite or outdoor time Disc poss of a fixed drug eruption  She takes chronic trimethoprim Also on gabapentin  Nothing is new however (ozempic started in nov)  Inst to hold the trimethoprim and call urology re this (is for uti proph)  Stop ketoconazole Keep clean and dry  Try triamcinolone 0.1% bid  Update asap if worse Update if not imp in 1-2 wk  Consider derm ref if needed   Not a classic appearance for psoriasis or eczema due to clearing in center

## 2022-03-23 NOTE — Patient Instructions (Signed)
I wonder if your skin/rash area may be a fixed drug reaction  Stay off the ketoconazole   Hold the trimethoprim for now  Let your urologist know  Drink lots of fluids  Try the triamcinolone on the area twice daily  If this gets worse call us  If not starting to improve in a week - call   Would consider dermatology evaluation

## 2022-04-03 ENCOUNTER — Encounter: Payer: Self-pay | Admitting: Family Medicine

## 2022-04-03 ENCOUNTER — Ambulatory Visit: Payer: 59 | Admitting: Family Medicine

## 2022-04-03 VITALS — BP 106/68 | HR 76 | Temp 97.0°F | Ht 62.5 in | Wt 190.1 lb

## 2022-04-03 DIAGNOSIS — E669 Obesity, unspecified: Secondary | ICD-10-CM | POA: Diagnosis not present

## 2022-04-03 DIAGNOSIS — L989 Disorder of the skin and subcutaneous tissue, unspecified: Secondary | ICD-10-CM | POA: Diagnosis not present

## 2022-04-03 DIAGNOSIS — E1169 Type 2 diabetes mellitus with other specified complication: Secondary | ICD-10-CM | POA: Diagnosis not present

## 2022-04-03 DIAGNOSIS — E785 Hyperlipidemia, unspecified: Secondary | ICD-10-CM

## 2022-04-03 DIAGNOSIS — I10 Essential (primary) hypertension: Secondary | ICD-10-CM | POA: Diagnosis not present

## 2022-04-03 LAB — POCT GLYCOSYLATED HEMOGLOBIN (HGB A1C): Hemoglobin A1C: 8.2 % — AB (ref 4.0–5.6)

## 2022-04-03 NOTE — Progress Notes (Signed)
Subjective:    Patient ID: Kelli Cruz, female    DOB: [birth year], 62 y.o.   MRN: [id number]  HPI Pt presents for f/u of DM2 and chronic health problems   Skin lesion is better on right leg after stopping trimethoprim  Has a lesion on other foot   Wt Readings from Last 3 Encounters:  04/03/22 190 lb 2 oz (86.2 kg)  03/23/22 194 lb (88 kg)  03/03/22 196 lb 8 oz (89.1 kg)   34.22 kg/m  Tired / really busy and work schedule  Weight is coming down  Down to 190 and thrilled   HTN bp is stable today  No cp or palpitations or headaches or edema  No side effects to medicines  BP Readings from Last 3 Encounters:  04/03/22 106/68  03/23/22 128/72  03/03/22 132/76     Pulse Readings from Last 3 Encounters:  04/03/22 76  03/23/22 97  03/03/22 92     Losartan 25 mg daily  Metoprolol xl 12.5 mg daily  Sees cardiology and nephrology   DM2 Last A1c 9.8 in mid November   Today down to 8.2 Improved    Low dose metformin (cannot adv due to renal issues) Glipizide xl 10 mg daily  Semaglutide 1 mg weekly (from pharm D at cardiology clinic) Tolerates it well so far  Does not affect her appetite much   Trying to watch what she eats  She eats a little popcorn for a night time snack with diet coke   Eating less hamburgers and fries  More salads instead  Eating smaller portions Gets rid of bun/bread when she can   Not a lot of exercise   Her brother has been sick/ she spends time with him in the hospital   Glucose readings are 130s-180s  Occ 200- not often That is a lot better   Eye exam  ? Within the last 2 years     Hyperlipidemia  Lab Results  Component Value Date   CHOL 171 02/08/2022   HDL 36 (L) 02/08/2022   LDLCALC 91 02/08/2022   LDLDIRECT 104.0 05/11/2021   TRIG 261 (H) 02/08/2022   CHOLHDL 4.8 (H) 02/08/2022   Atorvastatin 40 mg daily     Hypothyroid Lab Results  Component Value Date   TSH 1.06 05/11/2021   Patient Active Problem  List   Diagnosis Date Noted   Frequent UTI 03/23/2022   Skin lesion 03/03/2022   Chest wall pain 06/27/2021   Chronic cough 06/08/2021   Class 2 obesity due to excess calories with body mass index (BMI) of 37.0 to 37.9 in adult 05/11/2021   Irregular heart beat 05/11/2021   Loose stools 04/10/2020   History of total abdominal hysterectomy 01/22/2020   At high risk for breast cancer 01/22/2020   Hypertensive disorder 01/22/2020   Fatigue 09/12/2019   Leg cramps 08/15/2019   Chronic kidney disease, stage 3b ([place]) 02/03/2019   History of GI bleed 12/29/2018   Kidney stone 07/23/2018   Anemia 02/19/2017   Vitamin D deficiency 02/19/2017   Knee pain, right 11/10/2016   Proteinuria 02/18/2016   Exposure to communicable disease 08/04/2015   Hypersomnia with sleep apnea 01/06/2015   Insomnia due to mental condition    Tingling 08/12/2014   Burning sensation of mouth 08/12/2014   Numbness 10/24/2011   Routine general medical examination at a health care facility 10/24/2011   TMJ arthralgia 08/14/2011   Poor posture 07/26/2011   B12 deficiency 04/13/2010  Malignant tumor of colon ([place]) 08/11/2008   Hyperlipidemia associated with type 2 diabetes mellitus ([place]) 07/08/2008   Adenocarcinoma of rectum ([place]) 02/14/2008   Hypothyroidism 09/12/2006   Diabetes mellitus type 2 in obese ([place]) 09/12/2006   Depression with anxiety 09/12/2006   ALLERGIC RHINITIS, SEASONAL 09/12/2006   FIBROCYSTIC BREAST DISEASE 09/12/2006   MIGRAINES, HX OF 09/12/2006   Past Medical History:  Diagnosis Date   Anemia    Anxiety    Arthritis    Bipolar 1 disorder ([place])    Cancer ([place])    sigmoid colon cancer   Depression    Diabetes mellitus    type II   Endometriosis    Family history of malignant neoplasm of gastrointestinal tract    Ganglion cyst    GERD (gastroesophageal reflux disease)    Hyperlipidemia    Hypothyroidism    Insomnia due to mental condition    Past Surgical History:  Procedure  Laterality Date   ABDOMINAL HYSTERECTOMY     ABDOMINAL HYSTERECTOMY     BIOPSY  12/31/2018   Procedure: BIOPSY;  Surgeon: Doran Stabler, MD;  Location: [facility] ENDOSCOPY;  Service: Gastroenterology;;   COLON RESECTION  June 2010   [place] AND CURETTAGE OF UTERUS  2006   miscarriage    ESOPHAGOGASTRODUODENOSCOPY N/A 12/31/2018   Procedure: ESOPHAGOGASTRODUODENOSCOPY (EGD);  Surgeon: Doran Stabler, MD;  Location: [place];  Service: Gastroenterology;  Laterality: N/A;   GANGLION CYST EXCISION     HERNIA REPAIR     INTERSTIM IMPLANT PLACEMENT Left 12/11/2012   stimulator is on the left but the electrodes go to the right   [place]   D & C for endometriosis   Social History   Tobacco Use   Smoking status: Never    Passive exposure: Yes   Smokeless tobacco: Never  Vaping Use   Vaping Use: Never used  Substance Use Topics   Alcohol use: Yes    Alcohol/week: 0.0 standard drinks of alcohol    Comment: rare   Drug use: No   Family History  Problem Relation Age of Onset   Breast cancer Mother    Diabetes Mother    Coronary artery disease Mother    Kidney disease Mother        renal insufficiency   Hyperlipidemia Mother    Diabetes Father    Coronary artery disease Father    Colon cancer Father    Colon cancer Paternal Grandmother    Breast cancer Maternal Aunt    Breast cancer Paternal Aunt    Esophageal cancer Neg Hx    Rectal cancer Neg Hx    Stomach cancer Neg Hx    Allergies  Allergen Reactions   Ace Inhibitors Cough   Keflex [Cephalexin] Nausea And Vomiting and Other (See Comments)    Light-headed/dizziness/weakness   Mirabegron Other (See Comments)    Trouble breathing and swollen tongue   Current Outpatient Medications on File Prior to Visit  Medication Sig Dispense Refill   ALPRAZolam (XANAX) 0.5 MG tablet Take 0.5 mg by mouth at bedtime as needed for anxiety (panic attack).       amphetamine-dextroamphetamine (ADDERALL XR) 30 MG 24 hr capsule Take 30 mg by mouth daily at 6 (six) AM.      amphetamine-dextroamphetamine (ADDERALL) 15 MG tablet Take 15 mg by mouth daily at 2 PM.  aspirin EC 81 MG tablet Take 1 tablet (81 mg total) by mouth daily. Swallow whole. 30 tablet 11   atorvastatin (LIPITOR) 40 MG tablet Take 1 tablet (40 mg total) by mouth daily. 90 tablet 3   Blood Glucose Monitoring Suppl (ONETOUCH VERIO FLEX SYSTEM) w/Device KIT USE TO CHECK BLOOD SUGAR TWICE A DAY (DX. E11.9) 1 kit 0   cariprazine (VRAYLAR) capsule Take 3 mg by mouth every 3 (three) days.      Cholecalciferol (VITAMIN D3) 125 MCG (5000 UT) CAPS Take 5,000 Units by mouth daily.     citalopram (CELEXA) 40 MG tablet Take 40 mg by mouth at bedtime.      cyanocobalamin (,VITAMIN B-12,) 1000 MCG/ML injection Inject 1 mL (1,000 mcg total) into the muscle every 3 (three) months. every 8 weeks 1 mL 3   Cyanocobalamin (VITAMIN B-12) 5000 MCG TBDP Take 5,000 mcg by mouth daily.     doxepin (SINEQUAN) 50 MG capsule Take 50 mg by mouth at bedtime.   1   empagliflozin (JARDIANCE) 25 MG TABS tablet Take 1 tablet (25 mg total) by mouth daily before breakfast. 90 tablet 3   famotidine (PEPCID) 20 MG tablet      ferrous sulfate 325 (65 FE) MG tablet Take 325 mg by mouth daily with breakfast.     fluticasone (FLONASE) 50 MCG/ACT nasal spray Place 2 sprays into both nostrils daily.     gabapentin (NEURONTIN) 300 MG capsule TAKE 1 CAPSULE TWICE DAILY 180 capsule 1   glipiZIDE (GLUCOTROL XL) 10 MG 24 hr tablet Take 1 tablet (10 mg total) by mouth daily with breakfast. 90 tablet 3   glucose blood (ONETOUCH VERIO) test strip Use to check blood sugar twice daily 200 strip 1   HYDROcodone bit-homatropine (HYCODAN) 5-1.5 MG/5ML syrup Take 5 mLs by mouth every 6 (six) hours as needed for cough. 240 mL 0   Lancets (ONETOUCH DELICA PLUS 123XX123) MISC USE TO CHECK BLOOD SUGAR TWICE A DAY 200 each 1   loratadine  (CLARITIN) 10 MG tablet TAKE 1 TABLET BY MOUTH EVERY DAY 90 tablet 1   losartan (COZAAR) 25 MG tablet Take 1 tablet (25 mg total) by mouth daily. 90 tablet 3   metFORMIN (GLUCOPHAGE) 1000 MG tablet TAKE 1/2 TABLET TWICE A DAYWITH MEALS 90 tablet 2   metoprolol succinate (TOPROL XL) 25 MG 24 hr tablet Take 0.5 tablets (12.5 mg total) by mouth daily. 45 tablet 90   pantoprazole (PROTONIX) 40 MG tablet TAKE 1 TABLET BY MOUTH TWICE A DAY 60 tablet 1   Semaglutide, 1 MG/DOSE, 4 MG/3ML SOPN Inject 1 mg into the skin once a week. 3 mL 1   SYNTHROID 125 MCG tablet TAKE 1 TABLET DAILY 90 tablet 1   triamcinolone cream (KENALOG) 0.1 % Apply 1 Application topically 2 (two) times daily. To affected area 30 g 0   No current facility-administered medications on file prior to visit.    Review of Systems  Constitutional:  Negative for activity change, appetite change, fatigue, fever and unexpected weight change.  HENT:  Negative for congestion, ear pain, rhinorrhea, sinus pressure and sore throat.   Eyes:  Negative for pain, redness and visual disturbance.  Respiratory:  Negative for cough, shortness of breath and wheezing.   Cardiovascular:  Negative for chest pain and palpitations.  Gastrointestinal:  Negative for abdominal pain, blood in stool, constipation and diarrhea.  Endocrine: Negative for polydipsia and polyuria.  Genitourinary:  Negative for dysuria, frequency and urgency.  Musculoskeletal:  Negative for arthralgias, back pain and myalgias.  Skin:  Negative for pallor and rash.       Skin lesion L foot  Resolved on RLE  Allergic/Immunologic: Negative for environmental allergies.  Neurological:  Negative for dizziness, syncope and headaches.  Hematological:  Negative for adenopathy. Does not bruise/bleed easily.  Psychiatric/Behavioral:  Negative for decreased concentration and dysphoric mood. The patient is not nervous/anxious.        More motivated lately       Objective:   Physical  Exam Constitutional:      General: She is not in acute distress.    Appearance: Normal appearance. She is well-developed. She is obese. She is not ill-appearing or diaphoretic.  HENT:     Head: Normocephalic and atraumatic.     Mouth/Throat:     Mouth: Mucous membranes are moist.  Eyes:     General: No scleral icterus.    Conjunctiva/sclera: Conjunctivae normal.     Pupils: Pupils are equal, round, and reactive to light.  Neck:     Thyroid: No thyromegaly.     Vascular: No carotid bruit or JVD.  Cardiovascular:     Rate and Rhythm: Normal rate and regular rhythm.     Heart sounds: Normal heart sounds.     No gallop.  Pulmonary:     Effort: Pulmonary effort is normal. No respiratory distress.     Breath sounds: Normal breath sounds. No stridor. No wheezing, rhonchi or rales.  Abdominal:     General: There is no distension or abdominal bruit.     Palpations: Abdomen is soft. There is no mass.     Tenderness: There is no abdominal tenderness. There is no guarding or rebound.  Musculoskeletal:     Cervical back: Normal range of motion and neck supple.     Right lower leg: No edema.     Left lower leg: No edema.  Lymphadenopathy:     Cervical: No cervical adenopathy.  Skin:    General: Skin is warm and dry.     Coloration: Skin is not pale.     Findings: No rash.     Comments: RLE skin lesoin is resolved  1 cm brown to red slt raised area on L dorsal foot  Slt scale None tender No central clearing   Neurological:     Mental Status: She is alert.     Coordination: Coordination normal.     Deep Tendon Reflexes: Reflexes are normal and symmetric. Reflexes normal.  Psychiatric:        Mood and Affect: Mood normal.           Assessment & Plan:   Problem List Items Addressed This Visit       Cardiovascular and Mediastinum   Hypertensive disorder    bp in fair control at this time  BP Readings from Last 1 Encounters:  04/03/22 106/68  No changes needed Most  recent labs reviewed  Disc lifstyle change with low sodium diet and exercise  Plan to continue losartan 25 mg daily  Metoprolol xl 12.5 mg daily  Under care of cardiology and nephrology        Endocrine   Diabetes mellitus type 2 in obese [facility]) - Primary    Lab Results  Component Value Date   HGBA1C 8.2 (A) 04/03/2022  This is down from 9.8  Pt notes imp glucose readings and some wt loss  Plan to continue Glipizide xl 10 mg daily  Semaglutide  1 mg weekly until she runs out, then will disc inc to 2 mg with her cardiologist  (tolerates well, does not control appetite very much) Inst to schedule her eye exam Nl foot exam  Taking statin and arb F/u for annual exam in 3 mo      Relevant Orders   POCT glycosylated hemoglobin (Hb A1C) (Completed)   Hyperlipidemia associated with type 2 diabetes mellitus ([place])    Disc goals for lipids and reasons to control them Rev last labs with pt Rev low sat fat diet in detail  Plan to continue atorvastatin 40 mg daily  F/u 3 mo for annual exam with labs          Musculoskeletal and Integument   Skin lesion    From RLE- now resolved after stopping her trimethoprim (? Fixed drug rxn) Has a new area on L foot - less than 1 cm and appears more mole or plaque like  Will monitor closely for growth or itching

## 2022-04-03 NOTE — Patient Instructions (Addendum)
When you are ready - start some exercise  Use your [place]   Continue current medicines  Talk to your cardiology provider about going up (perhaps to 2 mg)   Keep working on healthy diet  Try to get most of your carbohydrates from produce (with the exception of white potatoes)  Eat less bread/pasta/rice/snack foods/cereals/sweets and other items from the middle of the grocery store (processed carbs)  Please schedule your diabetic eye exam  Have them send [place] a copy   Watch the spot on your left foot  Keep me posted   Schedule annual exam in 3 months Try to fast for 4 hours prior / or eat light

## 2022-04-03 NOTE — Assessment & Plan Note (Signed)
bp in fair control at this time  BP Readings from Last 1 Encounters:  04/03/22 106/68   No changes needed Most recent labs reviewed  Disc lifstyle change with low sodium diet and exercise  Plan to continue losartan 25 mg daily  Metoprolol xl 12.5 mg daily  Under care of cardiology and nephrology

## 2022-04-03 NOTE — Assessment & Plan Note (Signed)
Disc goals for lipids and reasons to control them Rev last labs with pt Rev low sat fat diet in detail  Plan to continue atorvastatin 40 mg daily  F/u 3 mo for annual exam with labs

## 2022-04-03 NOTE — Assessment & Plan Note (Signed)
From RLE- now resolved after stopping her trimethoprim (? Fixed drug rxn) Has a new area on L foot - less than 1 cm and appears more mole or plaque like  Will monitor closely for growth or itching

## 2022-04-03 NOTE — Assessment & Plan Note (Signed)
Lab Results  Component Value Date   HGBA1C 8.2 (A) 04/03/2022   This is down from 9.8  Pt notes imp glucose readings and some wt loss  Plan to continue Glipizide xl 10 mg daily  Semaglutide 1 mg weekly until she runs out, then will disc inc to 2 mg with her cardiologist  (tolerates well, does not control appetite very much) Inst to schedule her eye exam Nl foot exam  Taking statin and arb F/u for annual exam in 3 mo

## 2022-04-11 ENCOUNTER — Encounter: Payer: Self-pay | Admitting: Gastroenterology

## 2022-04-13 ENCOUNTER — Encounter: Payer: Self-pay | Admitting: Pharmacist

## 2022-04-13 DIAGNOSIS — E669 Obesity, unspecified: Secondary | ICD-10-CM

## 2022-04-14 MED ORDER — OZEMPIC (2 MG/DOSE) 8 MG/3ML ~~LOC~~ SOPN: 2.0000 mg | PEN_INJECTOR | SUBCUTANEOUS | 5 refills | Status: DC

## 2022-04-22 ENCOUNTER — Other Ambulatory Visit: Payer: Self-pay | Admitting: Family Medicine

## 2022-04-22 ENCOUNTER — Other Ambulatory Visit: Payer: Self-pay | Admitting: Gastroenterology

## 2022-04-26 ENCOUNTER — Ambulatory Visit: Payer: 59 | Attending: Cardiovascular Disease | Admitting: Cardiovascular Disease

## 2022-04-26 ENCOUNTER — Encounter: Payer: Self-pay | Admitting: Cardiovascular Disease

## 2022-04-26 VITALS — BP 116/58 | HR 83 | Ht 62.5 in | Wt 192.0 lb

## 2022-04-26 DIAGNOSIS — E119 Type 2 diabetes mellitus without complications: Secondary | ICD-10-CM | POA: Diagnosis not present

## 2022-04-26 DIAGNOSIS — E669 Obesity, unspecified: Secondary | ICD-10-CM

## 2022-04-26 DIAGNOSIS — E785 Hyperlipidemia, unspecified: Secondary | ICD-10-CM

## 2022-04-26 DIAGNOSIS — G4733 Obstructive sleep apnea (adult) (pediatric): Secondary | ICD-10-CM

## 2022-04-26 DIAGNOSIS — I152 Hypertension secondary to endocrine disorders: Secondary | ICD-10-CM

## 2022-04-26 DIAGNOSIS — E1159 Type 2 diabetes mellitus with other circulatory complications: Secondary | ICD-10-CM | POA: Diagnosis not present

## 2022-04-26 DIAGNOSIS — R002 Palpitations: Secondary | ICD-10-CM | POA: Diagnosis not present

## 2022-04-26 DIAGNOSIS — E1169 Type 2 diabetes mellitus with other specified complication: Secondary | ICD-10-CM

## 2022-04-26 DIAGNOSIS — I493 Ventricular premature depolarization: Secondary | ICD-10-CM

## 2022-04-26 MED ORDER — METOPROLOL SUCCINATE ER 25 MG PO TB24: 25.0000 mg | ORAL_TABLET | Freq: Every day | ORAL | 3 refills | Status: DC

## 2022-04-26 NOTE — Progress Notes (Signed)
Cardiology Office Note    Date:  05/01/2022   ID:  Kelli Cruz, DOB 29-Jan-1961, MRN [id number]  PCP:  Abner Greenspan, MD  Cardiologist:  Shelva Majestic, MD (sleep); Dr. Ali Lowe  New sleep consultation following initiation of CPAP therapy initially referred by Dr. Ali Lowe ARI   History of Present Illness:  Kelli Cruz is a 62 y.o. female who is followed by Dr. Rica Mote for primary care and sees Dr. Lenna Sciara for cardiology care.  Kelli Cruz has a history of hypertension, hyperlipidemia, type 2 diabetes mellitus, obesity, and remotely over 10 years ago had been evaluated for sleep apnea.  Recently, she has had symptoms of snoring, awakening gasping for breath, witnessed apnea, palpitations, and has experienced nocturia approximately 3 times per night.  She was referred for a diagnostic polysomnogram which was done on January 06, 2022 at [facility] sleep disorder center.  She was found to have mild overall sleep apnea with an AHI of 8.5; however her respiratory disturbance index was significantly elevated at 30.2/h.  AHI during REM sleep was 12.5/h and she had mild oxygen desaturation to a nadir of 85%.  Throughout the study there was evidence for moderate snoring.  She ultimately underwent AutoPap therapy and received a new ResMed AirSense 11 AutoSet unit on February 09, 2022 with [employer] as her DME company.  Initial download was obtained from February 12, 2022 through March 13, 2022.  She met compliance standards with 83% of usage days and usage greater than 4 hours just meeting compliance at 70%.  Average use on days used was 5 hours and 48 minutes.  Her initial pressure has been set at a range of 6 to 16 cm of water and an AHI is excellent at 1.4 with her 95th percentile pressure 12.2 and maximum average pressure 13.3.  A subsequent download from February 12 through April 25, 2022 shows improved compliance with 100% usage days and average use now improved but still suboptimal at 6  hours and 6 minutes.  AHI is 1.6 and her 95th percentile pressure was 11.2 with maximum average pressure 12.2.  Since initiating CPAP therapy, she feels her sleep is improved.  She has noticed improvement in previous nocturia now down to 1 time per night.  She is unaware of breakthrough snoring.  She has been using a fullface mask and she has noticed some mild irritation on the bridge of her nose.  An Epworth Sleepiness Scale score was calculated in the office today and this endorsed at 5 arguing against residual daytime sleepiness.  Presently, she is on losartan 25 mg and metoprolol succinate 12.5 mg for blood pressure.  She is diabetic on metformin, weekly Ozempic injection in addition to [place].  She is no longer on glipizide.  She is on atorvastatin 40 mg for hyperlipidemia and levothyroxine 125 mcg for hypothyroidism.  She has GERD on pantoprazole.  She presents for her initial sleep evaluation and consultation.   Past Medical History:  Diagnosis Date   Anemia    Anxiety    Arthritis    Bipolar 1 disorder ([place])    Cancer ([place])    sigmoid colon cancer   Depression    Diabetes mellitus    type II   Endometriosis    Family history of malignant neoplasm of gastrointestinal tract    Ganglion cyst    GERD (gastroesophageal reflux disease)    Hyperlipidemia    Hypothyroidism    Insomnia due to mental condition  Past Surgical History:  Procedure Laterality Date   ABDOMINAL HYSTERECTOMY     ABDOMINAL HYSTERECTOMY     BIOPSY  12/31/2018   Procedure: BIOPSY;  Surgeon: Doran Stabler, MD;  Location: [facility] ENDOSCOPY;  Service: Gastroenterology;;   COLON RESECTION  June 2010   COLON SURGERY     COLONOSCOPY     DILATION AND CURETTAGE OF UTERUS  2006   miscarriage    ESOPHAGOGASTRODUODENOSCOPY N/A 12/31/2018   Procedure: ESOPHAGOGASTRODUODENOSCOPY (EGD);  Surgeon: Doran Stabler, MD;  Location: [facility];  Service: Gastroenterology;  Laterality: N/A;   GANGLION CYST  EXCISION     HERNIA REPAIR     INTERSTIM IMPLANT PLACEMENT Left 12/11/2012   stimulator is on the left but the electrodes go to the right   [place]   D & C for endometriosis    Current Medications: Outpatient Medications Prior to Visit  Medication Sig Dispense Refill   ALPRAZolam (XANAX) 0.5 MG tablet Take 0.5 mg by mouth at bedtime as needed for anxiety (panic attack).      amphetamine-dextroamphetamine (ADDERALL XR) 30 MG 24 hr capsule Take 30 mg by mouth daily at 6 (six) AM.      amphetamine-dextroamphetamine (ADDERALL) 15 MG tablet Take 15 mg by mouth daily at 2 PM.      aspirin EC 81 MG tablet Take 1 tablet (81 mg total) by mouth daily. Swallow whole. 30 tablet 11   atorvastatin (LIPITOR) 40 MG tablet Take 1 tablet (40 mg total) by mouth daily. 90 tablet 3   Blood Glucose Monitoring Suppl (ONETOUCH VERIO FLEX SYSTEM) w/Device KIT USE TO CHECK BLOOD SUGAR TWICE A DAY (DX. E11.9) 1 kit 0   cariprazine (VRAYLAR) capsule Take 3 mg by mouth every 3 (three) days.      Cholecalciferol (VITAMIN D3) 125 MCG (5000 UT) CAPS Take 5,000 Units by mouth daily.     citalopram (CELEXA) 40 MG tablet Take 40 mg by mouth at bedtime.      cyanocobalamin (,VITAMIN B-12,) 1000 MCG/ML injection Inject 1 mL (1,000 mcg total) into the muscle every 3 (three) months. every 8 weeks 1 mL 3   Cyanocobalamin (VITAMIN B-12) 5000 MCG TBDP Take 5,000 mcg by mouth daily.     doxepin (SINEQUAN) 50 MG capsule Take 50 mg by mouth at bedtime.   1   empagliflozin (JARDIANCE) 25 MG TABS tablet Take 1 tablet (25 mg total) by mouth daily before breakfast. 90 tablet 3   famotidine (PEPCID) 20 MG tablet      ferrous sulfate 325 (65 FE) MG tablet Take 325 mg by mouth daily with breakfast.     fluticasone (FLONASE) 50 MCG/ACT nasal spray Place 2 sprays into both nostrils daily.     gabapentin (NEURONTIN) 300 MG capsule TAKE 1 CAPSULE TWICE DAILY 180 capsule 1   glipiZIDE (GLUCOTROL XL) 10 MG  24 hr tablet Take 1 tablet (10 mg total) by mouth daily with breakfast. 90 tablet 3   glucose blood (ONETOUCH VERIO) test strip Use to check blood sugar twice daily 200 strip 1   HYDROcodone bit-homatropine (HYCODAN) 5-1.5 MG/5ML syrup Take 5 mLs by mouth every 6 (six) hours as needed for cough. 240 mL 0   Lancets (ONETOUCH DELICA PLUS 123XX123) MISC USE TO CHECK BLOOD SUGAR TWICE A DAY 200 each 1   levothyroxine (SYNTHROID) 125 MCG tablet Take 1 tablet (125 mcg total) by mouth daily before breakfast. 90 tablet  0   loratadine (CLARITIN) 10 MG tablet TAKE 1 TABLET BY MOUTH EVERY DAY 90 tablet 1   losartan (COZAAR) 25 MG tablet Take 1 tablet (25 mg total) by mouth daily. 90 tablet 3   metFORMIN (GLUCOPHAGE) 1000 MG tablet TAKE 1/2 TABLET TWICE A DAYWITH MEALS 90 tablet 2   pantoprazole (PROTONIX) 40 MG tablet TAKE 1 TABLET EVERY MORNING 90 tablet 0   Semaglutide, 2 MG/DOSE, (OZEMPIC, 2 MG/DOSE,) 8 MG/3ML SOPN Inject 2 mg into the skin once a week. 3 mL 5   triamcinolone cream (KENALOG) 0.1 % Apply 1 Application topically 2 (two) times daily. To affected area 30 g 0   metoprolol succinate (TOPROL XL) 25 MG 24 hr tablet Take 0.5 tablets (12.5 mg total) by mouth daily. 45 tablet 90   No facility-administered medications prior to visit.     Allergies:   Ace inhibitors, Keflex [cephalexin], and Mirabegron   Social History   Socioeconomic History   Marital status: Married    Spouse name: Not on file   Number of children: 0   Years of education: Not on file   Highest education level: Not on file  Occupational History   Occupation: Enviroment health & Solicitor    Comment: [employer]   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   Tobacco Use   Smoking status: Never    Passive exposure: Yes   Smokeless tobacco: Never  Vaping Use   Vaping Use: Never used  Substance and Sexual Activity   Alcohol use: Yes    Alcohol/week: 0.0 standard drinks of alcohol    Comment: rare   Drug use: No    Sexual activity: Not Currently  Other Topics Concern   Not on file  Social History Narrative   Daily caffeine use   Social Determinants of Health   Financial Resource Strain: Not on file  Food Insecurity: Not on file  Transportation Needs: Not on file  Physical Activity: Not on file  Stress: Not on file  Social Connections: Not on file    Socially she was born in [place].  She is married for 23 years.  She has no children.  She works as Chief Executive Officer.  There is no tobacco history.   Family History:  The Kelli's family history includes Breast cancer in her maternal aunt, mother, and paternal aunt; Colon cancer in her father and paternal grandmother; Coronary artery disease in her father and mother; Diabetes in her father and mother; Hyperlipidemia in her mother; Kidney disease in her mother.  Her father died at age 2 and had lung cancer and had suffered a myocardial infarction.  Mother died at age 2 and had undergone CABG revascularization surgery and had suffered an MI.   ROS General: Negative; No fevers, chills, or night sweats;  HEENT: Negative; No changes in vision or hearing, sinus congestion, difficulty swallowing Pulmonary: Negative; No cough, wheezing, shortness of breath, hemoptysis Cardiovascular: Positive for hypertension, occasional palpitations, hyperlipidemia GI: Negative; No nausea, vomiting, diarrhea, or abdominal pain GU: Negative; No dysuria, hematuria, or difficulty voiding Musculoskeletal: Negative; no myalgias, joint pain, or weakness Hematologic/Oncology: Negative; no easy bruising, bleeding Endocrine: Positive for diabetes mellitus Neuro: Negative; no changes in balance, headaches Skin: Negative; No rashes or skin lesions Psychiatric: Negative; No behavioral problems, depression Sleep: OSA, now on CPAP therapy with set up date February 09, 2022  Epworth Sleepiness Scale: Situation   Chance of Dozing/Sleeping (0 = never , 1 =  slight chance ,  2 = moderate chance , 3 = high chance )   sitting and reading 1   watching TV 1   sitting inactive in a public place 1   being a passenger in a motor vehicle for an hour or more 0   lying down in the afternoon 2   sitting and talking to someone 0   sitting quietly after lunch (no alcohol) 0   while stopped for a few minutes in traffic as the driver 0   Total Score  5    Other comprehensive 14 point system review is negative.   PHYSICAL EXAM:   VS:  BP (!) 116/58   Pulse 83   Ht 5' 2.5" (1.588 m)   Wt 192 lb (87.1 kg)   LMP 07/15/2006   SpO2 97%   BMI 34.56 kg/m     Repeat blood pressure by me 114/62.  Wt Readings from Last 3 Encounters:  04/26/22 192 lb (87.1 kg)  04/03/22 190 lb 2 oz (86.2 kg)  03/23/22 194 lb (88 kg)    General: Alert, oriented, no distress.  Skin: normal turgor, no rashes, warm and dry HEENT: Normocephalic, atraumatic. Pupils equal round and reactive to light; sclera anicteric; extraocular muscles intact;  Nose without nasal septal hypertrophy Mouth/Parynx: Large tongue; Mallinpatti scale 3/4 Neck: No JVD, no carotid bruits; normal carotid upstroke Lungs: clear to ausculatation and percussion; no wheezing or rales Chest wall: without tenderness to palpitation Heart: PMI not displaced, RRR, s1 s2 normal, 1/6 systolic murmur, no diastolic murmur, no rubs, gallops, thrills, or heaves Abdomen: soft, nontender; no hepatosplenomehaly, BS+; abdominal aorta nontender and not dilated by palpation. Back: no CVA tenderness Pulses 2+ Musculoskeletal: full range of motion, normal strength, no joint deformities Extremities: no clubbing cyanosis or edema, Homan's sign negative  Neurologic: grossly nonfocal; Cranial nerves grossly wnl Psychologic: Normal mood and affect   Studies/Labs Reviewed:   April 26, 2022 ECG (independently read by me): Sinus rhythm at 83 with ventricular bigeminy, NSSTT change  Recent Labs:    Latest Ref Rng & Units  12/02/2021    7:13 AM 05/11/2021    4:28 PM 11/02/2020    4:20 PM  BMP  Glucose 70 - 99 mg/dL 184  92  114   BUN 6 - 24 mg/dL 19  23  31    Creatinine 0.57 - 1.00 mg/dL 1.40  1.43  1.53   BUN/Creat Ratio 9 - 23 14     Sodium 134 - 144 mmol/L 142  139  139   Potassium 3.5 - 5.2 mmol/L 5.3  4.3  4.6   Chloride 96 - 106 mmol/L 106  103  106   CO2 20 - 29 mmol/L 23  26  23    Calcium 8.7 - 10.2 mg/dL 9.5  9.5  10.0         Latest Ref Rng & Units 02/08/2022    7:14 AM 12/02/2021    7:13 AM 05/11/2021    4:28 PM  Hepatic Function  Total Protein 6.0 - 8.5 g/dL 6.1  6.6  6.9   Albumin 3.8 - 4.9 g/dL 4.0  3.9  4.2   AST 0 - 40 IU/L 28  17  16    ALT 0 - 32 IU/L 21  16  11    Alk Phosphatase 44 - 121 IU/L 88  105  81   Total Bilirubin 0.0 - 1.2 mg/dL 0.3  0.3  0.7   Bilirubin, Direct 0.00 - 0.40 mg/dL 0.10  0.12  Latest Ref Rng & Units 11/02/2020    4:20 PM 11/13/2019    4:12 PM 08/13/2019    4:02 PM  CBC  WBC 4.0 - 10.5 K/uL 9.3  8.3  9.1   Hemoglobin 12.0 - 15.0 g/dL 13.0  11.9  11.9   Hematocrit 36.0 - 46.0 % 40.1  36.5  36.1   Platelets 150.0 - 400.0 K/uL 325.0  345.0  377.0    Lab Results  Component Value Date   MCV 97.2 11/02/2020   MCV 95.5 11/13/2019   MCV 96.6 08/13/2019   Lab Results  Component Value Date   TSH 1.06 05/11/2021   Lab Results  Component Value Date   HGBA1C 8.2 (A) 04/03/2022     BNP No results found for: "BNP"  ProBNP No results found for: "PROBNP"   Lipid Panel     Component Value Date/Time   CHOL 171 02/08/2022 0714   CHOL 167 05/10/2021 1150   TRIG 261 (H) 02/08/2022 0714   TRIG 218 (H) 05/10/2021 1150   HDL 36 (L) 02/08/2022 0714   CHOLHDL 4.8 (H) 02/08/2022 0714   CHOLHDL 5 05/11/2021 1628   VLDL 41.2 (H) 05/11/2021 1628   LDLCALC 91 02/08/2022 0714   LDLDIRECT 104.0 05/11/2021 1628   LABVLDL 44 (H) 02/08/2022 0714     RADIOLOGY: No results found.   Additional studies/ records that were reviewed today include:      Kelli Cruz, Kelli Cruz Date: 01/06/2022 Gender: Female D.O.B: [birth year] Age (years): 61 Referring Provider: Lenna Sciara Height (inches): 63 Interpreting Physician: Shelva Majestic MD, ABSM Weight (lbs): 207 RPSGT: Peak, Robert BMI: 37 MRN: [id number] Neck Size: <br>   CLINICAL INFORMATION Sleep Study Type: NPSG   Indication for sleep study: snoring, fatigue, palpitations   Epworth Sleepiness Score: 9   SLEEP STUDY TECHNIQUE As per the AASM Manual for the Scoring of Sleep and Associated Events v2.3 (April 2016) with a hypopnea requiring 4% desaturations.   The channels recorded and monitored were frontal, central and occipital EEG, electrooculogram (EOG), submentalis EMG (chin), nasal and oral airflow, thoracic and abdominal wall motion, anterior tibialis EMG, snore microphone, electrocardiogram, and pulse oximetry.   MEDICATIONS ALPRAZolam (XANAX) 0.5 MG table amphetamine-dextroamphetamine (ADDERALL XR) 30 MG 24 hr capsule amphetamine-dextroamphetamine (ADDERALL) 15 MG tablet aspirin EC 81 MG tablet atorvastatin (LIPITOR) 40 MG tablet Blood Glucose Monitoring Suppl (ONETOUCH VERIO FLEX SYSTEM) w/Device KIT cariprazine (VRAYLAR) capsule Cholecalciferol (VITAMIN D3) 125 MCG (5000 UT) CAPS citalopram (CELEXA) 40 MG tablet cyanocobalamin (,VITAMIN B-12,) 1000 MCG/ML injection Cyanocobalamin (VITAMIN B-12) 5000 MCG TBDP doxepin (SINEQUAN) 50 MG capsule doxycycline (VIBRA-TABS) 100 MG tablet empagliflozin (JARDIANCE) 25 MG TABS tablet famotidine (PEPCID) 20 MG tablet ferrous sulfate 325 (65 FE) MG tablet fluticasone (FLONASE) 50 MCG/ACT nasal spra gabapentin (NEURONTIN) 300 MG capsule glipiZIDE (GLUCOTROL XL) 10 MG 24 hr tablet glucose blood (ONETOUCH VERIO) test strip HYDROcodone bit-homatropine (HYCODAN) 5-1.5 MG/5ML syrup Lancets (ONETOUCH DELICA PLUS 123XX123) MISC loratadine (CLARITIN) 10 MG tablet losartan (COZAAR) 25 MG tablet metFORMIN  (GLUCOPHAGE) 1000 MG tablet metoprolol succinate (TOPROL XL) 25 MG 24 hr tablet pantoprazole (PROTONIX) 40 MG tablet Semaglutide,0.25 or 0.5MG /DOS, (OZEMPIC, 0.25 OR 0.5 MG/DOSE,) 2 MG/3ML SOPN SYNTHROID 125 MCG tablet trimethoprim (TRIMPEX) 100 MG tablet Medications self-administered by Kelli taken the night of the study : N/A   SLEEP ARCHITECTURE The study was initiated at 9:37:22 PM and ended at 5:30:20 AM.   Sleep onset time was 6.2 minutes and the sleep efficiency was 87.1%. The total  sleep time was 411.8 minutes.   Stage REM latency was 200.0 minutes.   The Kelli spent 9.47% of the night in stage N1 sleep, 75.59% in stage N2 sleep, 0.97% in stage N3 and 14% in REM.   Alpha intrusion was absent.   Supine sleep was 100.00%.   RESPIRATORY PARAMETERS The overall apnea/hypopnea index (AHI) was 8.5 per hour. The respiratory disturbance index was significantly elevated at 30.2/h  There were 28 total apneas, including 14 obstructive, 14 central and 0 mixed apneas. There were 30 hypopneas and 149 RERAs.   The AHI during Stage REM sleep was 12.5 per hour.   AHI while supine was 8.5 per hour.   The mean oxygen saturation was 94.02%. The minimum SpO2 during sleep was 85.00%.   Moderate snoring was noted during this study.   CARDIAC DATA The 2 lead EKG demonstrated sinus rhythm. The mean heart rate was 87.71 beats per minute. Other EKG findings include: None.   LEG MOVEMENT DATA The total PLMS were 0 with a resulting PLMS index of 0.00. Associated arousal with leg movement index was 0.0 .   IMPRESSIONS - Mild obstructive sleep apnea overall ( AHI8.5/h; with RDI 30.2/h). Sleep apnea was worse during REM sleep (AHI 12.5/h).  - No significant central sleep apnea occurred during this study (CAI  2/h). - Mild oxygen desaturation to a nadir of 85.00%. - The Kelli snored with moderate snoring volume. - No cardiac abnormalities were noted during this study. - Clinically significant  periodic limb movements did not occur during sleep. No significant associated arousals.   DIAGNOSIS - Obstructive Sleep Apnea (G47.33)   RECOMMENDATIONS - Therapeutic CPAP titration to determine optimal pressure required to alleviate sleep disordered breathing. If unable to obtain an in-lab titration, recommend Auto-PAP with EPR of 3 at 6 - 16 cm of water. - Effort should be made to optimize nasal and oropharyngeal patency. - Positional therapy avoiding supine position during sleep. - Avoid alcohol, sedatives and other CNS depressants that may worsen sleep apnea and disrupt normal sleep architecture. - Sleep hygiene should be reviewed to assess factors that may improve sleep quality. - Weight management (BMI 37) and regular exercise should be initiated or continued if appropriate.      ASSESSMENT:    1. OSA (obstructive sleep apnea)   2. Palpitations   3. Controlled type 2 diabetes mellitus without complication, without long-term current use of insulin ([place])   4. Hypertension associated with diabetes ([place])   5. Hyperlipidemia associated with type 2 diabetes mellitus ([facility])   6. Obesity, mild     PLAN:  Kelli Cruz is a 62 year old female who is followed by Dr. Glori Bickers for primary care and sees Dr. Milas Hock for cardiology care.  Kelli has a history of mild obesity in addition to hypertension, hyperlipidemia, palpitations, type 2 diabetes mellitus, and remotely had undergone a prior evaluation for sleep apnea over 10 years ago.  She has had symptoms suggestive of sleep apnea including snoring, awakening gasping for breath, witnessed apnea, nocturnal palpitations as well as nocturia at least 2-3 times per night.  On physical exam she has a large tongue and Mallampati scale of 3/4.  She was referred for a diagnostic polysomnogram and was found to have mild overall sleep apnea with an AHI of 8.5/h and events were worse during REM sleep with an AHI of 12.5/h.  However her respiratory  disturbance index was significantly elevated at 30.2/h.  During her sleep study she had moderate snoring  throughout the study and dropped her oxygen to a nadir of 85%.  She is subsequently been started on CPAP auto therapy and received a new ResMed AirSense 11 AutoSet unit on February 09, 2022.  Her initial settings had been 6 to 16 cm of wate  She has hadr.  I obtained to downloads since inception of therapy and she is meeting compliance standards.  Her most recent download from February 12 through April 25, 2022 shows average use at 6 hours and 6 minutes per night.  AHI is excellent at 1.6 and her 95th percentile pressure is 11.2 with maximum average pressure 12.2.  With this being her initial sleep evaluation I had a lengthy discussion with her and discussed normal sleep architecture and the disruptive sleep architecture associated with obstructive sleep apnea.  I reviewed in detail the effect on cardiovascular health if patients have untreated sleep apnea with particular reference to its effects on hypertension, nocturnal arrhythmias with increased sympathetic tone contributing to palpitations as well as increased atrial fibrillation risk.  I discussed its effects on insulin resistance contributing to increased glucose, nocturnal GERD, as well as potential increased inflammation.  In addition I discussed nocturnal hypoxemia contributing potentially to nocturnal ischemia both in  the cardiac as well as cerebrovascular circulation.  I also discussed the pathophysiology associated with frequent nocturia seen in patients with untreated sleep apnea and the benefit of therapy.  Although she is compliant with CPAP therapy, I discussed optimal sleep duration at 7 and 9 hours.  She feels improved with initiation of CPAP therapy and presently is unaware of any breakthrough snoring, and she is no longer awakening gasping for breath.  Her nocturia has improved to 0-1 time per night.  I discussed the benefits of weight loss  and increased exercise.  Presently, I will make changes to her AutoPap therapy and with her 95th percentile pressure at 11.2 I will change her pressure parameters to a range of 9 to 16 cm of water.  She has been using a fullface mask and at times notes some irritation on the bridge of her nose.  I have suggested that she try a ResMed AirFit F30i mask where the nasal portion will be under the nose rather than on her nasal bridge.  I answered all her questions.  She was very grateful of the time I spent with her reviewing pathophysiology as well as benefits of therapy.  As long as she is stable I will see her in 1 year for follow-up evaluation or sooner as needed   Medication Adjustments/Labs and Tests Ordered: Current medicines are reviewed at length with the Kelli today.  Concerns regarding medicines are outlined above.  Medication changes, Labs and Tests ordered today are listed in the Kelli Instructions below. Kelli Instructions  Medication Instructions:  INCREASE metoprolol succinate (Toprol XL) to 25 mg daily  *If you need a refill on your cardiac medications before your next appointment, please call your pharmacy*  Follow-Up: At [facility], you and your health needs are our priority.  As part of our continuing mission to provide you with exceptional heart care, we have created designated Provider Care Teams.  These Care Teams include your primary Cardiologist (physician) and Advanced Practice Providers (APPs -  Physician Assistants and Nurse Practitioners) who all work together to provide you with the care you need, when you need it.  We recommend signing up for the Kelli portal called "MyChart".  Sign up information is provided on this After Visit  Summary.  MyChart is used to connect with patients for Virtual Visits (Telemedicine).  Patients are able to view lab/test results, encounter notes, upcoming appointments, etc.  Non-urgent messages can be sent to your provider as well.    To learn more about what you can do with MyChart, go to [email or web address].    Your next appointment:   12 month(s)  Provider:   Dr. Claiborne Billings (sleep clinic)   Signed, Shelva Majestic, MD, [facility], [place], American Board of Sleep Medicine  05/01/2022 12:36 PM    [facility] [address] [place], [place], [place]  [address] Phone: [phone]

## 2022-04-26 NOTE — Patient Instructions (Signed)
Medication Instructions:  INCREASE metoprolol succinate (Toprol XL) to 25 mg daily  *If you need a refill on your cardiac medications before your next appointment, please call your pharmacy*  Follow-Up: At [facility], you and your health needs are our priority.  As part of our continuing mission to provide you with exceptional heart care, we have created designated Provider Care Teams.  These Care Teams include your primary Cardiologist (physician) and Advanced Practice Providers (APPs -  Physician Assistants and Nurse Practitioners) who all work together to provide you with the care you need, when you need it.  We recommend signing up for the patient portal called "MyChart".  Sign up information is provided on this After Visit Summary.  MyChart is used to connect with patients for Virtual Visits (Telemedicine).  Patients are able to view lab/test results, encounter notes, upcoming appointments, etc.  Non-urgent messages can be sent to your provider as well.   To learn more about what you can do with MyChart, go to [email or web address].    Your next appointment:   12 month(s)  Provider:   Dr. Claiborne Billings (sleep clinic)

## 2022-05-01 ENCOUNTER — Encounter: Payer: Self-pay | Admitting: Cardiovascular Disease

## 2022-05-03 ENCOUNTER — Telehealth: Payer: Self-pay | Admitting: *Deleted

## 2022-05-03 NOTE — Telephone Encounter (Signed)
Order for Resmed F30i mask sent to [facility] via [employer] portal.

## 2022-05-03 NOTE — Telephone Encounter (Signed)
-----   Message from Silverio Lay, RN sent at 04/26/2022  5:35 PM EDT ----- Regarding: mask order Please send order for new mask: Resmed F30i per Dr. Claiborne Billings.   Thanks!

## 2022-06-06 ENCOUNTER — Other Ambulatory Visit: Payer: Self-pay | Admitting: *Deleted

## 2022-06-06 DIAGNOSIS — E1169 Type 2 diabetes mellitus with other specified complication: Secondary | ICD-10-CM

## 2022-06-06 NOTE — Progress Notes (Signed)
Called and left a message for patient (DPR) to call to schedule appointment for blood work.  Her last lipids/liver were in Dec on 10 mg atorvastatin.  At that time she increased her dose to 40 mg daily.     She needs lab appointment.

## 2022-06-07 ENCOUNTER — Ambulatory Visit: Payer: 59 | Admitting: Gastroenterology

## 2022-06-07 ENCOUNTER — Encounter: Payer: Self-pay | Admitting: Gastroenterology

## 2022-06-07 VITALS — BP 118/68 | HR 78 | Ht 62.0 in | Wt 186.0 lb

## 2022-06-07 DIAGNOSIS — Z8719 Personal history of other diseases of the digestive system: Secondary | ICD-10-CM | POA: Diagnosis not present

## 2022-06-07 DIAGNOSIS — K7581 Nonalcoholic steatohepatitis (NASH): Secondary | ICD-10-CM

## 2022-06-07 NOTE — Progress Notes (Signed)
[facility] Progress Note  Chief Complaint:  Chief Complaint  Patient presents with   Diarrhea    Pt state she has no complaint.    Subjective  History: From Dr. Cala Cruz beavers office note from March 2023: "IMPRESSION:  Postoperative pelvic floor dysfunction    - improved after completing PT with Kelli Cruz NSAID associated PUD with associated GI bleeding 12/2018    - resolution of ulcers on EGD 2021 Suspect slight/early fatty infiltration of the liver on ultrasound 06/04/19    - normal AST/ALT    - NASH Fibrosure 07/22/19: F0(no fibrosis), S2-3(moderate steatosis), NASH score of N2 Personal history of colon cancer s/p resection 2010 Hyperplastic polyps on colonoscopy 10/09/2018   Altered bowel habits with suspected pelvic floor dysfunction: She has experienced significant improvement with pelvic floor PT. Consider anorectal manometry if symptoms worsen.    Symptomatic gastritis and history of PUD: Continue to avoid all NSAIDs. Resume pantoprazole 40 mg QAM. Discussed the potential long-term risks of PPI therapy.    Suspected NASH: Liver enzymes 9/22 remain normal.  Given concurrent diabetes, there is increased risk for disease progression. Plan NASH Fibrosure to restage.   Personal history of colon cancer and polyps: Colonoscopy due 2025. Would consider an earlier study with new symptoms.   PLAN: - Continue pantoprazole  QAM (3 months with 3 refills) - Continue to avoid NSAIDs - NASH FibroSURE - Colonoscopy 2025 given the personal history of colon cancer - Office follow-up at least annually, earlier as needed" ______________________________________  Today, she complains of irregular bowel habits. She states that she has to either strain to have a BM or would just have one if she's not careful.  She attributes these problems to her pelvic floor dysfunction.   She reports taking pantoprazole 40 mg daily and states that she hasn't tried to reduce it. She denies  taking any NSAID'S.  It appears she has been on this medicine since I saw her in the hospital for a severe duodenal ulcer in 2020.  She states that she was diagnosed with heart arrhythmia and thus has ben seen a cardiologist who has her on Ozempic for weight management and diabetes.  Reportedly been helpful for weight reduction and control of diabetes.   Kelli Cruz denies nausea, blood in stool, black stool, vomiting, abdominal pain, bloating, unintentional weight loss, reflux, dysphagia.  ROS: Review of Systems  Constitutional:  Negative for appetite change and fever.  HENT:  Negative for trouble swallowing.   Respiratory:  Negative for cough and shortness of breath.   Cardiovascular:  Negative for chest pain.  Gastrointestinal:  Positive for constipation and diarrhea. Negative for abdominal distention, abdominal pain, anal bleeding, blood in stool, nausea, rectal pain and vomiting.  Genitourinary:  Negative for dysuria.  Musculoskeletal:  Negative for back pain.  Skin:  Negative for rash.  Neurological:  Negative for weakness.  All other systems reviewed and are negative.    [name] Past Medical, Family and Social History were reviewed and are on file in the EMR. Past Medical History:  Diagnosis Date   Anemia    Anxiety    Arthritis    Bipolar 1 disorder (HCC)    Cancer (HCC)    sigmoid colon cancer   Depression    Diabetes mellitus    type II   Endometriosis    Family history of malignant neoplasm of gastrointestinal tract    Ganglion cyst    GERD (gastroesophageal reflux disease)    Hyperlipidemia  Hypothyroidism    Insomnia due to mental condition     Objective:  Med list reviewed  Current Outpatient Medications:    ALPRAZolam (XANAX) 0.5 MG tablet, Take 0.5 mg by mouth at bedtime as needed for anxiety (panic attack). , Disp: , Rfl:    amphetamine-dextroamphetamine (ADDERALL XR) 30 MG 24 hr capsule, Take 30 mg by mouth daily at 6 (six) AM. , Disp: , Rfl:     amphetamine-dextroamphetamine (ADDERALL) 15 MG tablet, Take 15 mg by mouth daily at 2 PM. , Disp: , Rfl:    aspirin EC 81 MG tablet, Take 1 tablet (81 mg total) by mouth daily. Swallow whole., Disp: 30 tablet, Rfl: 11   atorvastatin (LIPITOR) 40 MG tablet, Take 1 tablet (40 mg total) by mouth daily., Disp: 90 tablet, Rfl: 3   Blood Glucose Monitoring Suppl (ONETOUCH VERIO FLEX SYSTEM) w/Device KIT, USE TO CHECK BLOOD SUGAR TWICE A DAY (DX. E11.9), Disp: 1 kit, Rfl: 0   cariprazine (VRAYLAR) capsule, Take 3 mg by mouth every 3 (three) days. , Disp: , Rfl:    Cholecalciferol (VITAMIN D3) 125 MCG (5000 UT) CAPS, Take 5,000 Units by mouth daily., Disp: , Rfl:    citalopram (CELEXA) 40 MG tablet, Take 40 mg by mouth at bedtime. , Disp: , Rfl:    cyanocobalamin (,VITAMIN B-12,) 1000 MCG/ML injection, Inject 1 mL (1,000 mcg total) into the muscle every 3 (three) months. every 8 weeks, Disp: 1 mL, Rfl: 3   Cyanocobalamin (VITAMIN B-12) 5000 MCG TBDP, Take 5,000 mcg by mouth daily., Disp: , Rfl:    doxepin (SINEQUAN) 50 MG capsule, Take 50 mg by mouth at bedtime. , Disp: , Rfl: 1   empagliflozin (JARDIANCE) 25 MG TABS tablet, Take 1 tablet (25 mg total) by mouth daily before breakfast., Disp: 90 tablet, Rfl: 3   famotidine (PEPCID) 20 MG tablet, , Disp: , Rfl:    ferrous sulfate 325 (65 FE) MG tablet, Take 325 mg by mouth daily with breakfast., Disp: , Rfl:    fluticasone (FLONASE) 50 MCG/ACT nasal spray, Place 2 sprays into both nostrils daily., Disp: , Rfl:    gabapentin (NEURONTIN) 300 MG capsule, TAKE 1 CAPSULE TWICE DAILY, Disp: 180 capsule, Rfl: 1   glipiZIDE (GLUCOTROL XL) 10 MG 24 hr tablet, Take 1 tablet (10 mg total) by mouth daily with breakfast., Disp: 90 tablet, Rfl: 3   glucose blood (ONETOUCH VERIO) test strip, Use to check blood sugar twice daily, Disp: 200 strip, Rfl: 1   HYDROcodone bit-homatropine (HYCODAN) 5-1.5 MG/5ML syrup, Take 5 mLs by mouth every 6 (six) hours as needed for cough.,  Disp: 240 mL, Rfl: 0   Lancets (ONETOUCH DELICA PLUS LANCET33G) MISC, USE TO CHECK BLOOD SUGAR TWICE A DAY, Disp: 200 each, Rfl: 1   levothyroxine (SYNTHROID) 125 MCG tablet, Take 1 tablet (125 mcg total) by mouth daily before breakfast., Disp: 90 tablet, Rfl: 0   loratadine (CLARITIN) 10 MG tablet, TAKE 1 TABLET BY MOUTH EVERY DAY, Disp: 90 tablet, Rfl: 1   losartan (COZAAR) 25 MG tablet, Take 1 tablet (25 mg total) by mouth daily., Disp: 90 tablet, Rfl: 3   metFORMIN (GLUCOPHAGE) 1000 MG tablet, TAKE 1/2 TABLET TWICE A DAYWITH MEALS, Disp: 90 tablet, Rfl: 2   metoprolol succinate (TOPROL XL) 25 MG 24 hr tablet, Take 1 tablet (25 mg total) by mouth daily., Disp: 90 tablet, Rfl: 3   pantoprazole (PROTONIX) 40 MG tablet, TAKE 1 TABLET EVERY MORNING, Disp: 90 tablet, Rfl:  0   Semaglutide, 2 MG/DOSE, (OZEMPIC, 2 MG/DOSE,) 8 MG/3ML SOPN, Inject 2 mg into the skin once a week., Disp: 3 mL, Rfl: 5   triamcinolone cream (KENALOG) 0.1 %, Apply 1 Application topically 2 (two) times daily. To affected area, Disp: 30 g, Rfl: 0   Vital signs in last 24 hrs: There were no vitals filed for this visit. Wt Readings from Last 3 Encounters:  04/26/22 192 lb (87.1 kg)  04/03/22 190 lb 2 oz (86.2 kg)  03/23/22 194 lb (88 kg)    Physical Exam  General: well-appearing   Eyes: sclera anicteric, no redness ENT: oral mucosa moist without lesions, no cervical or supraclavicular lymphadenopathy CV: RRR, no JVD, no peripheral edema Resp: clear to auscultation bilaterally, normal RR and effort noted GI: soft, no tenderness, with active bowel sounds. No guarding or palpable organomegaly noted. Skin; warm and dry, no rash or jaundice noted Neuro: awake, alert and oriented x 3. Normal gross motor function and fluent speech   Labs:  March 2023 FibroSure result suggested likely no fibrosis. ___________________________________________ Radiologic  studies:   ____________________________________________ Other:   _____________________________________________ Assessment & Plan  Assessment: No diagnosis found. Metabolic associated fatty liver- -low risk FibroSure 1 year ago, will recheck next year.  That condition might improve with the metabolic improvement she has had on GLP-1 agonist.  Chronic constipation with some intermittent diarrhea pelvic floor dysfunction  History of NSAID induced duodenal ulcer, healed on follow-up EGD in 2021, has not taken NSAIDs for years.  I would like to try de-escalating her acid suppression therapy and perhaps taper her off it if possible. Plan:  -advised to taper pantoprazole to 40 mg every other day for 7 to 10 days, then twice a week for the same period time, then contact me with how she feels.  We can then decide whether or not to continue it or perhaps change to an H2 blocker.  Otherwise plan to see [place] in 1 year or sooner if needed.   Kelli Cruz  I,Kelli Cruz,acting as a scribe for Kelli Pitter III, MD.,have documented all relevant documentation on the behalf of Kelli Rist, MD,as directed by  Kelli Rist, MD while in the presence of Kelli Rist, MD.   Kelli Repress III, MD, have reviewed all documentation for this visit. The documentation on 06/07/22 for the exam, diagnosis, procedures, and orders are all accurate and complete.

## 2022-06-07 NOTE — Progress Notes (Deleted)
[facility] Progress Note  Chief Complaint: Fatty liver  Subjective  History: From Dr. Cala Bradford beavers office note from March 2023: "IMPRESSION:  Postoperative pelvic floor dysfunction    - improved after completing PT with Eulis Foster NSAID associated PUD with associated GI bleeding 12/2018    - resolution of ulcers on EGD 2021 Suspect slight/early fatty infiltration of the liver on ultrasound 06/04/19    - normal AST/ALT    - NASH Fibrosure 07/22/19: F0(no fibrosis), S2-3(moderate steatosis), NASH score of N2 Personal history of colon cancer s/p resection 2010 Hyperplastic polyps on colonoscopy 10/09/2018   Altered bowel habits with suspected pelvic floor dysfunction: She has experienced significant improvement with pelvic floor PT. Consider anorectal manometry if symptoms worsen.    Symptomatic gastritis and history of PUD: Continue to avoid all NSAIDs. Resume pantoprazole 40 mg QAM. Discussed the potential long-term risks of PPI therapy.    Suspected NASH: Liver enzymes 9/22 remain normal.  Given concurrent diabetes, there is increased risk for disease progression. Plan NASH Fibrosure to restage.   Personal history of colon cancer and polyps: Colonoscopy due 2025. Would consider an earlier study with new symptoms.   PLAN: - Continue pantoprazole  QAM (3 months with 3 refills) - Continue to avoid NSAIDs - NASH FibroSURE - Colonoscopy 2025 given the personal history of colon cancer - Office follow-up at least annually, earlier as needed" ______________________________________  ***  ROS: Cardiovascular:  no chest pain Respiratory: no dyspnea  [name] Past Medical, Family and Social History were reviewed and are on file in the EMR. Past Medical History:  Diagnosis Date   Anemia    Anxiety    Arthritis    Bipolar 1 disorder (HCC)    Cancer (HCC)    sigmoid colon cancer   Depression    Diabetes mellitus    type II   Endometriosis    Family history  of malignant neoplasm of gastrointestinal tract    Ganglion cyst    GERD (gastroesophageal reflux disease)    Hyperlipidemia    Hypothyroidism    Insomnia due to mental condition     Objective:  Med list reviewed  Current Outpatient Medications:    ALPRAZolam (XANAX) 0.5 MG tablet, Take 0.5 mg by mouth at bedtime as needed for anxiety (panic attack). , Disp: , Rfl:    amphetamine-dextroamphetamine (ADDERALL XR) 30 MG 24 hr capsule, Take 30 mg by mouth daily at 6 (six) AM. , Disp: , Rfl:    amphetamine-dextroamphetamine (ADDERALL) 15 MG tablet, Take 15 mg by mouth daily at 2 PM. , Disp: , Rfl:    aspirin EC 81 MG tablet, Take 1 tablet (81 mg total) by mouth daily. Swallow whole., Disp: 30 tablet, Rfl: 11   atorvastatin (LIPITOR) 40 MG tablet, Take 1 tablet (40 mg total) by mouth daily., Disp: 90 tablet, Rfl: 3   Blood Glucose Monitoring Suppl (ONETOUCH VERIO FLEX SYSTEM) w/Device KIT, USE TO CHECK BLOOD SUGAR TWICE A DAY (DX. E11.9), Disp: 1 kit, Rfl: 0   cariprazine (VRAYLAR) capsule, Take 3 mg by mouth every 3 (three) days. , Disp: , Rfl:    Cholecalciferol (VITAMIN D3) 125 MCG (5000 UT) CAPS, Take 5,000 Units by mouth daily., Disp: , Rfl:    citalopram (CELEXA) 40 MG tablet, Take 40 mg by mouth at bedtime. , Disp: , Rfl:    cyanocobalamin (,VITAMIN B-12,) 1000 MCG/ML injection, Inject 1 mL (1,000 mcg total) into the muscle every 3 (three) months. every  8 weeks, Disp: 1 mL, Rfl: 3   Cyanocobalamin (VITAMIN B-12) 5000 MCG TBDP, Take 5,000 mcg by mouth daily., Disp: , Rfl:    doxepin (SINEQUAN) 50 MG capsule, Take 50 mg by mouth at bedtime. , Disp: , Rfl: 1   empagliflozin (JARDIANCE) 25 MG TABS tablet, Take 1 tablet (25 mg total) by mouth daily before breakfast., Disp: 90 tablet, Rfl: 3   famotidine (PEPCID) 20 MG tablet, , Disp: , Rfl:    ferrous sulfate 325 (65 FE) MG tablet, Take 325 mg by mouth daily with breakfast., Disp: , Rfl:    fluticasone (FLONASE) 50 MCG/ACT nasal spray, Place  2 sprays into both nostrils daily., Disp: , Rfl:    gabapentin (NEURONTIN) 300 MG capsule, TAKE 1 CAPSULE TWICE DAILY, Disp: 180 capsule, Rfl: 1   glipiZIDE (GLUCOTROL XL) 10 MG 24 hr tablet, Take 1 tablet (10 mg total) by mouth daily with breakfast., Disp: 90 tablet, Rfl: 3   glucose blood (ONETOUCH VERIO) test strip, Use to check blood sugar twice daily, Disp: 200 strip, Rfl: 1   HYDROcodone bit-homatropine (HYCODAN) 5-1.5 MG/5ML syrup, Take 5 mLs by mouth every 6 (six) hours as needed for cough., Disp: 240 mL, Rfl: 0   Lancets (ONETOUCH DELICA PLUS LANCET33G) MISC, USE TO CHECK BLOOD SUGAR TWICE A DAY, Disp: 200 each, Rfl: 1   levothyroxine (SYNTHROID) 125 MCG tablet, Take 1 tablet (125 mcg total) by mouth daily before breakfast., Disp: 90 tablet, Rfl: 0   loratadine (CLARITIN) 10 MG tablet, TAKE 1 TABLET BY MOUTH EVERY DAY, Disp: 90 tablet, Rfl: 1   losartan (COZAAR) 25 MG tablet, Take 1 tablet (25 mg total) by mouth daily., Disp: 90 tablet, Rfl: 3   metFORMIN (GLUCOPHAGE) 1000 MG tablet, TAKE 1/2 TABLET TWICE A DAYWITH MEALS, Disp: 90 tablet, Rfl: 2   metoprolol succinate (TOPROL XL) 25 MG 24 hr tablet, Take 1 tablet (25 mg total) by mouth daily., Disp: 90 tablet, Rfl: 3   pantoprazole (PROTONIX) 40 MG tablet, TAKE 1 TABLET EVERY MORNING, Disp: 90 tablet, Rfl: 0   Semaglutide, 2 MG/DOSE, (OZEMPIC, 2 MG/DOSE,) 8 MG/3ML SOPN, Inject 2 mg into the skin once a week., Disp: 3 mL, Rfl: 5   triamcinolone cream (KENALOG) 0.1 %, Apply 1 Application topically 2 (two) times daily. To affected area, Disp: 30 g, Rfl: 0   Vital signs in last 24 hrs: There were no vitals filed for this visit. Wt Readings from Last 3 Encounters:  04/26/22 192 lb (87.1 kg)  04/03/22 190 lb 2 oz (86.2 kg)  03/23/22 194 lb (88 kg)    Physical Exam  *** HEENT: sclera anicteric, oral mucosa moist without lesions Neck: supple, no thyromegaly, JVD or lymphadenopathy Cardiac: ***,  no peripheral edema Pulm: clear to  auscultation bilaterally, normal RR and effort noted Abdomen: soft, *** tenderness, with active bowel sounds. No guarding or palpable hepatosplenomegaly. Skin; warm and dry, no jaundice or rash  Labs:  March 2023 FibroSure result suggested likely no fibrosis. ___________________________________________ Radiologic studies:   ____________________________________________ Other:   _____________________________________________ Assessment & Plan  Assessment: No diagnosis found.    Plan:   *** minutes were spent on this encounter (including chart review, history/exam, counseling/coordination of care, and documentation) > 50% of that time was spent on counseling and coordination of care.   Charlie Pitter III

## 2022-06-07 NOTE — Patient Instructions (Signed)
_______________________________________________________  If your blood pressure at your visit was 140/90 or greater, please contact your primary care physician to follow up on this.  _______________________________________________________  If you are age 60 or older, your body mass index should be between 23-30. Your Body mass index is 34.02 kg/m. If this is out of the aforementioned range listed, please consider follow up with your Primary Care Provider.  If you are age 68 or younger, your body mass index should be between 19-25. Your Body mass index is 34.02 kg/m. If this is out of the aformentioned range listed, please consider follow up with your Primary Care Provider.   ________________________________________________________  The [facility] providers would like to encourage you to use [facility] to communicate with providers for non-urgent requests or questions.  Due to long hold times on the telephone, sending your provider a message by [facility] may be a faster and more efficient way to get a response.  Please allow 48 business hours for a response.  Please remember that this is for non-urgent requests.  _______________________________________________________  It was a pleasure to see you today!  Thank you for trusting me with your gastrointestinal care!

## 2022-06-15 ENCOUNTER — Other Ambulatory Visit: Payer: Self-pay | Admitting: Internal Medicine

## 2022-06-15 DIAGNOSIS — E1169 Type 2 diabetes mellitus with other specified complication: Secondary | ICD-10-CM

## 2022-06-15 DIAGNOSIS — E119 Type 2 diabetes mellitus without complications: Secondary | ICD-10-CM

## 2022-07-01 ENCOUNTER — Other Ambulatory Visit: Payer: Self-pay | Admitting: Family Medicine

## 2022-07-03 ENCOUNTER — Ambulatory Visit (INDEPENDENT_AMBULATORY_CARE_PROVIDER_SITE_OTHER): Payer: 59 | Admitting: Family Medicine

## 2022-07-03 ENCOUNTER — Encounter: Payer: Self-pay | Admitting: Family Medicine

## 2022-07-03 VITALS — BP 120/70 | HR 72 | Temp 97.8°F | Ht 62.0 in | Wt 184.5 lb

## 2022-07-03 DIAGNOSIS — E785 Hyperlipidemia, unspecified: Secondary | ICD-10-CM | POA: Diagnosis not present

## 2022-07-03 DIAGNOSIS — R809 Proteinuria, unspecified: Secondary | ICD-10-CM

## 2022-07-03 DIAGNOSIS — E559 Vitamin D deficiency, unspecified: Secondary | ICD-10-CM

## 2022-07-03 DIAGNOSIS — Z7985 Long-term (current) use of injectable non-insulin antidiabetic drugs: Secondary | ICD-10-CM

## 2022-07-03 DIAGNOSIS — D500 Iron deficiency anemia secondary to blood loss (chronic): Secondary | ICD-10-CM

## 2022-07-03 DIAGNOSIS — E039 Hypothyroidism, unspecified: Secondary | ICD-10-CM | POA: Diagnosis not present

## 2022-07-03 DIAGNOSIS — Z0000 Encounter for general adult medical examination without abnormal findings: Secondary | ICD-10-CM

## 2022-07-03 DIAGNOSIS — I700 Atherosclerosis of aorta: Secondary | ICD-10-CM

## 2022-07-03 DIAGNOSIS — Z8719 Personal history of other diseases of the digestive system: Secondary | ICD-10-CM

## 2022-07-03 DIAGNOSIS — E1169 Type 2 diabetes mellitus with other specified complication: Secondary | ICD-10-CM | POA: Diagnosis not present

## 2022-07-03 DIAGNOSIS — Z6833 Body mass index (BMI) 33.0-33.9, adult: Secondary | ICD-10-CM

## 2022-07-03 DIAGNOSIS — E538 Deficiency of other specified B group vitamins: Secondary | ICD-10-CM

## 2022-07-03 DIAGNOSIS — E118 Type 2 diabetes mellitus with unspecified complications: Secondary | ICD-10-CM

## 2022-07-03 DIAGNOSIS — I10 Essential (primary) hypertension: Secondary | ICD-10-CM

## 2022-07-03 DIAGNOSIS — N1832 Chronic kidney disease, stage 3b: Secondary | ICD-10-CM | POA: Diagnosis not present

## 2022-07-03 DIAGNOSIS — F418 Other specified anxiety disorders: Secondary | ICD-10-CM

## 2022-07-03 NOTE — Progress Notes (Unsigned)
Subjective:    Patient ID: Kelli Cruz, female    DOB: [birth year], 62 y.o.   MRN: [id number]  HPI Here for health maintenance exam and to review chronic medical problems    Wt Readings from Last 3 Encounters:  07/03/22 184 lb 8 oz (83.7 kg)  06/07/22 186 lb (84.4 kg)  04/26/22 192 lb (87.1 kg)   33.75 kg/m  Still losing weight slowly   Needs more exercising  Vitals:   07/03/22 1533  BP: 136/74  Pulse: 72  Temp: 97.8 F (36.6 C)  SpO2: 97%   Immunization History  Administered Date(s) Administered   Influenza Inj Mdck Quad Pf 12/05/2021   Influenza Split 12/05/2010, 10/24/2011, 11/14/2017   Influenza Whole 02/14/2004, 12/08/2009   Influenza, Seasonal, Injecte, Preservative Fre 12/08/2014   Influenza,inj,Quad PF,6+ Mos 10/16/2012, 01/09/2017, 10/05/2018, 11/02/2020   Influenza-Unspecified 10/29/2015, 11/13/2019   Moderna Sars-Covid-2 Vaccination 05/12/2019, 06/09/2019, 01/16/2020   Pneumococcal Polysaccharide-23 12/05/2010, 09/12/2017   Td 01/03/2005   Tdap 08/04/2015   Health Maintenance Due  Topic Date Due   MAMMOGRAM  01/25/2018   PAP SMEAR-Modifier  01/26/2020   OPHTHALMOLOGY EXAM  03/11/2020   Mammogram 2018 Self breast exam  Pap 01/2017 normal at [facility]/ has had one since  Hysterectomy in past/ total   Stays up to date   Eye exam- was scheduled and then had to re schedule   Shingix- not yet , weill consider next year   Colonoscopy 09/2018 with 5 y recall Past h/o adenocarcinoma of rectum    Mood  H/o dep and anx Has been fair [place]      07/03/2022    4:12 PM 04/03/2022    3:38 PM 06/27/2021   12:03 PM 11/02/2020    5:00 PM 11/17/2019    5:09 PM  Depression screen PHQ 2/9  Decreased Interest 0 0 0 0 1  Down, Depressed, Hopeless 1 1 0 1 1  PHQ - 2 Score 1 1 0 1 2  Altered sleeping 1 0  1 0  Tired, decreased energy 1 1  1 1   Change in appetite 0 0  1 2  Feeling bad or failure about yourself  0 0  0 1  Trouble concentrating 0 0  0 0   Moving slowly or fidgety/restless 0 0  0 0  Suicidal thoughts 0 0   0  PHQ-9 Score 3 2  4 6   Difficult doing work/chores Not difficult at all Not difficult at all  Not difficult at all       HTN bp is stable today  No cp or palpitations or headaches or edema  No side effects to medicines  BP Readings from Last 3 Encounters:  07/03/22 136/74  06/07/22 118/68  04/26/22 (!) 116/58     Pulse Readings from Last 3 Encounters:  07/03/22 72  06/07/22 78  04/26/22 83    Losartan 25 mg daily  Metoprolol xl 12.5 mg daily   CKD Last metabolic panel Lab Results  Component Value Date   GLUCOSE 184 (H) 12/02/2021   NA 142 12/02/2021   K 5.3 (H) 12/02/2021   CL 106 12/02/2021   CO2 23 12/02/2021   BUN 19 12/02/2021   CREATININE 1.40 (H) 12/02/2021   EGFR 44 (L) 12/02/2021   CALCIUM 9.5 12/02/2021   PHOS 3.7 09/12/2019   PROT 6.1 02/08/2022   ALBUMIN 4.0 02/08/2022   LABGLOB 2.7 12/02/2021   AGRATIO 1.4 12/02/2021   BILITOT 0.3 02/08/2022  ALKPHOS 88 02/08/2022   AST 28 02/08/2022   ALT 21 02/08/2022   ANIONGAP 15 06/03/2019   Sees nephrology - is due for that visit  Cr is stable    Anemia  Lab Results  Component Value Date   WBC 9.3 11/02/2020   HGB 13.0 11/02/2020   HCT 40.1 11/02/2020   MCV 97.2 11/02/2020   PLT 325.0 11/02/2020    Lab Results  Component Value Date   VITAMINB12 681 05/11/2021   Due for labs   Hypothyroidism  Pt has no clinical changes No change in energy level/ hair or skin/ edema and no tremor Lab Results  Component Value Date   TSH 1.06 05/11/2021    Levothyroxine 125 mcg daily   Dm2 Lab Results  Component Value Date   HGBA1C 8.2 (A) 04/03/2022   Thinks it will be much better Usually in the low 100s  Never over 200    Hyperlipidemia     Lab Results  Component Value Date   CHOL 171 02/08/2022   CHOL 208 (H) 12/02/2021   CHOL 164 05/11/2021   Lab Results  Component Value Date   HDL 36 (L) 02/08/2022   HDL 37  (L) 12/02/2021   HDL 35.70 (L) 05/11/2021   Lab Results  Component Value Date   LDLCALC 91 02/08/2022   LDLCALC 135 (H) 12/02/2021   LDLCALC 75 03/08/2018   Lab Results  Component Value Date   TRIG 261 (H) 02/08/2022   TRIG 202 (H) 12/02/2021   TRIG 206.0 (H) 05/11/2021   Lab Results  Component Value Date   CHOLHDL 4.8 (H) 02/08/2022   CHOLHDL 5.6 (H) 12/02/2021   CHOLHDL 5 05/11/2021   Lab Results  Component Value Date   LDLDIRECT 104.0 05/11/2021   LDLDIRECT 117.0 11/02/2020   LDLDIRECT 99.0 11/13/2019   Atorvastatin 40 mg daily  No missed doses  Aware not fasting- she comes to far for that    Patient Active Problem List   Diagnosis Date Noted   Frequent UTI 03/23/2022   Chronic cough 06/08/2021   Class 1 obesity due to excess calories with serious comorbidity and body mass index (BMI) of 33.0 to 33.9 in adult 05/11/2021   Irregular heart beat 05/11/2021   Loose stools 04/10/2020   History of total abdominal hysterectomy 01/22/2020   At high risk for breast cancer 01/22/2020   Hypertensive disorder 01/22/2020   Fatigue 09/12/2019   Leg cramps 08/15/2019   Chronic kidney disease, stage 3b (HCC) 02/03/2019   History of GI bleed 12/29/2018   Kidney stone 07/23/2018   Anemia 02/19/2017   Vitamin D deficiency 02/19/2017   Knee pain, right 11/10/2016   Proteinuria 02/18/2016   Exposure to communicable disease 08/04/2015   Hypersomnia with sleep apnea 01/06/2015   Insomnia due to mental condition    Tingling 08/12/2014   Burning sensation of mouth 08/12/2014   Numbness 10/24/2011   Routine general medical examination at a health care facility 10/24/2011   TMJ arthralgia 08/14/2011   Poor posture 07/26/2011   B12 deficiency 04/13/2010   History of colon cancer 08/11/2008   Hyperlipidemia associated with type 2 diabetes mellitus (HCC) 07/08/2008   Adenocarcinoma of rectum (HCC) 02/14/2008   Hypothyroidism 09/12/2006   Controlled diabetes mellitus type 2 with  complications (HCC) 09/12/2006   Depression with anxiety 09/12/2006   ALLERGIC RHINITIS, SEASONAL 09/12/2006   FIBROCYSTIC BREAST DISEASE 09/12/2006   MIGRAINES, HX OF 09/12/2006   Past Medical History:  Diagnosis Date  Allergy 2013   When took medicine for overactive bladder   Anemia    Anxiety    Arthritis    Bipolar 1 disorder (HCC)    Blood transfusion without reported diagnosis 12/2018   Cancer [facility])    sigmoid colon cancer   Chronic kidney disease    Clotting disorder (HCC) 12/2018   Bleeding ulcer   Depression    Diabetes mellitus    type II   Endometriosis    Family history of malignant neoplasm of gastrointestinal tract    Ganglion cyst    GERD (gastroesophageal reflux disease)    Hyperlipidemia    Hypothyroidism    Insomnia due to mental condition    Irregular heart beat    Myocardial infarction (HCC) 2023   Sleep apnea    Ulcer 12/2018   Bleeding ulcer   Past Surgical History:  Procedure Laterality Date   ABDOMINAL HYSTERECTOMY     ABDOMINAL HYSTERECTOMY     BIOPSY  12/31/2018   Procedure: BIOPSY;  Surgeon: Sherrilyn Rist, MD;  Location: [facility] ENDOSCOPY;  Service: Gastroenterology;;   BREAST SURGERY     Had a small lump in left breast checked. Was not cancer   COLON RESECTION  07/14/2008   COLON SURGERY     COLONOSCOPY     DILATION AND CURETTAGE OF UTERUS  02/14/2004   miscarriage    ESOPHAGOGASTRODUODENOSCOPY N/A 12/31/2018   Procedure: ESOPHAGOGASTRODUODENOSCOPY (EGD);  Surgeon: Sherrilyn Rist, MD;  Location: [facility] ENDOSCOPY;  Service: Gastroenterology;  Laterality: N/A;   GANGLION CYST EXCISION     HERNIA REPAIR     INTERSTIM IMPLANT PLACEMENT Left 12/11/2012   stimulator is on the left but the electrodes go to the right   KNEE DISLOCATION SURGERY     LAPAROSCOPY  02/14/1995   D & C for endometriosis   Social History   Tobacco Use   Smoking status: Never    Passive exposure: Yes   Smokeless tobacco: Never  Vaping Use   Vaping Use:  Never used  Substance Use Topics   Alcohol use: Yes    Comment: 1-2 / year   Drug use: No   Family History  Problem Relation Age of Onset   Breast cancer Mother    Diabetes Mother    Coronary artery disease Mother    Kidney disease Mother        renal insufficiency   Hyperlipidemia Mother    Anxiety disorder Mother    Arthritis Mother    Cancer Mother    Depression Mother    Heart disease Mother    Miscarriages / Stillbirths Mother    Obesity Mother    Diabetes Father    Coronary artery disease Father    Colon cancer Father    Anxiety disorder Father    Arthritis Father    Hearing loss Father    Colon cancer Paternal Grandmother    Breast cancer Maternal Aunt    Breast cancer Paternal Aunt    Anxiety disorder Brother    Depression Brother    Diabetes Brother    Kidney disease Brother    Cancer Maternal Aunt    Depression Brother    Diabetes Brother    Early death Brother    Esophageal cancer Neg Hx    Rectal cancer Neg Hx    Stomach cancer Neg Hx    Allergies  Allergen Reactions   Ace Inhibitors Cough   Keflex [Cephalexin] Nausea And Vomiting and Other (See  Comments)    Light-headed/dizziness/weakness   Mirabegron Other (See Comments)    Trouble breathing and swollen tongue   Current Outpatient Medications on File Prior to Visit  Medication Sig Dispense Refill   ALPRAZolam (XANAX) 0.5 MG tablet Take 0.5 mg by mouth at bedtime as needed for anxiety (panic attack).      amphetamine-dextroamphetamine (ADDERALL XR) 30 MG 24 hr capsule Take 30 mg by mouth daily at 6 (six) AM.      amphetamine-dextroamphetamine (ADDERALL) 15 MG tablet Take 15 mg by mouth daily at 2 PM.      aspirin EC 81 MG tablet Take 1 tablet (81 mg total) by mouth daily. Swallow whole. 30 tablet 11   atorvastatin (LIPITOR) 40 MG tablet Take 1 tablet (40 mg total) by mouth daily. 90 tablet 3   Blood Glucose Monitoring Suppl (ONETOUCH VERIO FLEX SYSTEM) w/Device KIT USE TO CHECK BLOOD SUGAR TWICE A  DAY (DX. E11.9) 1 kit 0   cariprazine (VRAYLAR) capsule Take 3 mg by mouth every 3 (three) days.      Cholecalciferol (VITAMIN D3) 125 MCG (5000 UT) CAPS Take 5,000 Units by mouth daily.     citalopram (CELEXA) 40 MG tablet Take 40 mg by mouth at bedtime.      cyanocobalamin (,VITAMIN B-12,) 1000 MCG/ML injection Inject 1 mL (1,000 mcg total) into the muscle every 3 (three) months. every 8 weeks 1 mL 3   Cyanocobalamin (VITAMIN B-12) 5000 MCG TBDP Take 5,000 mcg by mouth daily.     doxepin (SINEQUAN) 50 MG capsule Take 50 mg by mouth at bedtime.   1   empagliflozin (JARDIANCE) 25 MG TABS tablet Take 1 tablet (25 mg total) by mouth daily before breakfast. 90 tablet 3   famotidine (PEPCID) 20 MG tablet      ferrous sulfate 325 (65 FE) MG tablet Take 325 mg by mouth daily with breakfast.     fluticasone (FLONASE) 50 MCG/ACT nasal spray Place 2 sprays into both nostrils daily.     gabapentin (NEURONTIN) 300 MG capsule TAKE 1 CAPSULE TWICE DAILY 180 capsule 1   glipiZIDE (GLUCOTROL XL) 10 MG 24 hr tablet Take 1 tablet (10 mg total) by mouth daily with breakfast. 90 tablet 3   glucose blood (ONETOUCH VERIO) test strip Use to check blood sugar twice daily 200 strip 1   HYDROcodone bit-homatropine (HYCODAN) 5-1.5 MG/5ML syrup Take 5 mLs by mouth every 6 (six) hours as needed for cough. 240 mL 0   Lancets (ONETOUCH DELICA PLUS LANCET33G) MISC USE TO CHECK BLOOD SUGAR TWICE A DAY 200 each 1   levothyroxine (SYNTHROID) 125 MCG tablet Take 1 tablet (125 mcg total) by mouth daily before breakfast. 90 tablet 0   loratadine (CLARITIN) 10 MG tablet TAKE 1 TABLET BY MOUTH EVERY DAY 90 tablet 1   losartan (COZAAR) 25 MG tablet Take 1 tablet (25 mg total) by mouth daily. 90 tablet 3   metFORMIN (GLUCOPHAGE) 1000 MG tablet TAKE 1/2 TABLET TWICE A DAYWITH MEALS 90 tablet 2   metoprolol succinate (TOPROL XL) 25 MG 24 hr tablet Take 1 tablet (25 mg total) by mouth daily. 90 tablet 3   pantoprazole (PROTONIX) 40 MG  tablet TAKE 1 TABLET EVERY MORNING 90 tablet 0   Semaglutide, 2 MG/DOSE, (OZEMPIC, 2 MG/DOSE,) 8 MG/3ML SOPN Inject 2 mg into the skin once a week. 3 mL 5   triamcinolone cream (KENALOG) 0.1 % Apply 1 Application topically 2 (two) times daily. To affected area 30  g 0   No current facility-administered medications on file prior to visit.    Review of Systems  Constitutional:  Negative for activity change, appetite change, fatigue, fever and unexpected weight change.  HENT:  Negative for congestion, ear pain, rhinorrhea, sinus pressure and sore throat.   Eyes:  Negative for pain, redness and visual disturbance.  Respiratory:  Negative for cough, shortness of breath and wheezing.   Cardiovascular:  Negative for chest pain and palpitations.  Gastrointestinal:  Negative for abdominal pain, blood in stool, constipation and diarrhea.  Endocrine: Negative for polydipsia and polyuria.  Genitourinary:  Negative for dysuria, frequency and urgency.  Musculoskeletal:  Positive for arthralgias. Negative for back pain and myalgias.  Skin:  Negative for pallor and rash.  Allergic/Immunologic: Negative for environmental allergies.  Neurological:  Negative for dizziness, syncope and headaches.  Hematological:  Negative for adenopathy. Does not bruise/bleed easily.  Psychiatric/Behavioral:  Positive for dysphoric mood. Negative for decreased concentration. The patient is not nervous/anxious.        Objective:   Physical Exam Constitutional:      General: She is not in acute distress.    Appearance: Normal appearance. She is well-developed. She is obese. She is not ill-appearing or diaphoretic.  HENT:     Head: Normocephalic and atraumatic.     Right Ear: Tympanic membrane, ear canal and external ear normal.     Left Ear: Tympanic membrane, ear canal and external ear normal.     Nose: Nose normal. No congestion.     Mouth/Throat:     Mouth: Mucous membranes are moist.     Pharynx: Oropharynx is  clear. No posterior oropharyngeal erythema.  Eyes:     General: No scleral icterus.    Extraocular Movements: Extraocular movements intact.     Conjunctiva/sclera: Conjunctivae normal.     Pupils: Pupils are equal, round, and reactive to light.  Neck:     Thyroid: No thyromegaly.     Vascular: No carotid bruit or JVD.  Cardiovascular:     Rate and Rhythm: Normal rate and regular rhythm.     Pulses: Normal pulses.     Heart sounds: Normal heart sounds.     No gallop.  Pulmonary:     Effort: Pulmonary effort is normal. No respiratory distress.     Breath sounds: Normal breath sounds. No wheezing.     Comments: Good air exch Chest:     Chest wall: No tenderness.  Abdominal:     General: Bowel sounds are normal. There is no distension or abdominal bruit.     Palpations: Abdomen is soft. There is no mass.     Tenderness: There is no abdominal tenderness.     Hernia: No hernia is present.  Genitourinary:    Comments: Breast and pelvic exams are done by gyn provider  Musculoskeletal:        General: No tenderness. Normal range of motion.     Cervical back: Normal range of motion and neck supple. No rigidity. No muscular tenderness.     Right lower leg: No edema.     Left lower leg: No edema.     Comments: No kyphosis   Lymphadenopathy:     Cervical: No cervical adenopathy.  Skin:    General: Skin is warm and dry.     Coloration: Skin is not pale.     Findings: No erythema or rash.     Comments: Solar lentigines diffusely   Neurological:  Mental Status: She is alert. Mental status is at baseline.     Cranial Nerves: No cranial nerve deficit.     Motor: No abnormal muscle tone.     Coordination: Coordination normal.     Gait: Gait normal.     Deep Tendon Reflexes: Reflexes are normal and symmetric. Reflexes normal.  Psychiatric:        Mood and Affect: Mood normal.        Cognition and Memory: Cognition and memory normal.           Assessment & Plan:   Problem  List Items Addressed This Visit       Cardiovascular and Mediastinum   Aortic atherosclerosis (HCC)    No symptoms Continue to work on good HTN and lipid control       Hypertensive disorder    bp is stable today  No cp or palpitations or headaches or edema  No side effects to medicines  BP Readings from Last 3 Encounters:  07/03/22 120/70  06/07/22 118/68  04/26/22 (!) 116/58    Plan to continue Losartan 25 mg daily  Metoprolol xl 12.5 mg daily      Relevant Orders   TSH   Lipid panel   Comprehensive metabolic panel   CBC with Differential/Platelet     Endocrine   Controlled diabetes mellitus type 2 with complications (HCC)    A1C today  Per pt glucose levels are much improved  Tolerating semaglutide and losing wt Continues glipizide xl 10 mg daily  Sees nephrologist for nephropathy  Eye exam is due  No microalbumin due to fact she sees nephrology  No change in foot exam  F/u pending depending on lab On statin and arb      Relevant Orders   Hemoglobin A1c   Hyperlipidemia associated with type 2 diabetes mellitus (HCC)    Disc goals for lipids and reasons to control them Rev last labs with pt Rev low sat fat diet in detail  Lab today Aware not fasting so trig may be up (she cannot come fasting with work schedule) Continues atorvastatin 40 mg daily       Relevant Orders   Lipid panel   Hypothyroidism    Hypothyroidism  Pt has no clinical changes No change in energy level/ hair or skin/ edema and no tremor Lab Results  Component Value Date   TSH 1.06 05/11/2021    Plan to continue levothyroxine 125 mcg daily        Relevant Orders   TSH     Genitourinary   Chronic kidney disease, stage 3b (HCC)    Utd nephrology care  Is diabetic       Relevant Orders   Comprehensive metabolic panel     Other   Anemia    Anemia of chronic dz in past  Ckd   Cbc and iron added to labs today      Relevant Orders   CBC with Differential/Platelet    Iron   B12 deficiency    B12 level today Gets B12 shot every 6 months       Relevant Orders   Vitamin B12   Class 1 obesity due to excess calories with serious comorbidity and body mass index (BMI) of 33.0 to 33.9 in adult    Discussed how this problem influences overall health and the risks it imposes  Reviewed plan for weight loss with lower calorie diet (via better food choices and also portion control or program  like weight watchers) and exercise building up to or more than 30 minutes 5 days per week including some aerobic activity  Some wt loss with semaglutide and she feels better Encouraged to add strength training       Depression with anxiety    Continues psychiatric care  PHQ score of 3       History of GI bleed    Sees GI yearly now       Proteinuria    Continues nephrology care       Routine general medical examination at a health care facility - Primary    Reviewed health habits including diet and exercise and skin cancer prevention Reviewed appropriate screening tests for age  Also reviewed health mt list, fam hx and immunization status , as well as social and family history   See HPI Labs reviewed and ordered Sent for mammogram, pap (had hyst) , dexa reports from gyn  Will consider shingrix next year-info given  Colonoscopy 09/2018 with 5 y recall  Mood is stable under care of psychiatry  Needs to schedule her eye exam/pt is aware       Vitamin D deficiency    D level today Discussed importance to bone and overall health        Relevant Orders   VITAMIN D 25 Hydroxy (Vit-D Deficiency, Fractures)

## 2022-07-03 NOTE — Assessment & Plan Note (Signed)
No symptoms Continue to work on good HTN and lipid control

## 2022-07-03 NOTE — Assessment & Plan Note (Signed)
Hypothyroidism  Pt has no clinical changes No change in energy level/ hair or skin/ edema and no tremor Lab Results  Component Value Date   TSH 1.06 05/11/2021    Plan to continue levothyroxine 125 mcg daily   

## 2022-07-03 NOTE — Assessment & Plan Note (Signed)
Continues psychiatric care  PHQ score of 3

## 2022-07-03 NOTE — Assessment & Plan Note (Signed)
Sees GI yearly now

## 2022-07-03 NOTE — Assessment & Plan Note (Signed)
A1C today  Per pt glucose levels are much improved  Tolerating semaglutide and losing wt Continues glipizide xl 10 mg daily  Sees nephrologist for nephropathy  Eye exam is due  No microalbumin due to fact she sees nephrology  No change in foot exam  F/u pending depending on lab On statin and arb

## 2022-07-03 NOTE — Assessment & Plan Note (Signed)
Reviewed health habits including diet and exercise and skin cancer prevention Reviewed appropriate screening tests for age  Also reviewed health mt list, fam hx and immunization status , as well as social and family history   See HPI Labs reviewed and ordered Sent for mammogram, pap (had hyst) , dexa reports from gyn  Will consider shingrix next year-info given  Colonoscopy 09/2018 with 5 y recall  Mood is stable under care of psychiatry  Needs to schedule her eye exam/pt is aware

## 2022-07-03 NOTE — Assessment & Plan Note (Signed)
Discussed how this problem influences overall health and the risks it imposes  Reviewed plan for weight loss with lower calorie diet (via better food choices and also portion control or program like weight watchers) and exercise building up to or more than 30 minutes 5 days per week including some aerobic activity  Some wt loss with semaglutide and she feels better Encouraged to add strength training

## 2022-07-03 NOTE — Assessment & Plan Note (Signed)
Disc goals for lipids and reasons to control them Rev last labs with pt Rev low sat fat diet in detail  Lab today Aware not fasting so trig may be up (she cannot come fasting with work schedule) Continues atorvastatin 40 mg daily

## 2022-07-03 NOTE — Assessment & Plan Note (Signed)
Utd nephrology care  Is diabetic

## 2022-07-03 NOTE — Assessment & Plan Note (Signed)
D level today Discussed importance to bone and overall health

## 2022-07-03 NOTE — Assessment & Plan Note (Signed)
Continues nephrology care  

## 2022-07-03 NOTE — Assessment & Plan Note (Signed)
bp is stable today  No cp or palpitations or headaches or edema  No side effects to medicines  BP Readings from Last 3 Encounters:  07/03/22 120/70  06/07/22 118/68  04/26/22 (!) 116/58    Plan to continue Losartan 25 mg daily  Metoprolol xl 12.5 mg daily

## 2022-07-03 NOTE — Patient Instructions (Addendum)
Add some strength training to your routine, this is important for bone and brain health and can reduce your risk of falls and help your body use insulin properly and regulate weight  Light weights, exercise bands , and internet videos are a good way to start  Yoga (chair or regular), machines , floor exercises or a gym with machines are also good options   Re schedule your eye exam  If you need to go somewhere else and you need a referral lt [place] know  Diabetic exam cannot be done virtually    If you are interested in the new shingles vaccine (Shingrix) - call your local pharmacy to check on coverage and availability  If affordable, get on a wait list at your pharmacy to get the vaccine.   We will send for your gyn reports   Labs today

## 2022-07-03 NOTE — Telephone Encounter (Signed)
Last filled on 01/09/22 #180 caps 1 refill, CPE is scheduled today at 3:30pm

## 2022-07-03 NOTE — Assessment & Plan Note (Signed)
B12 level today Gets B12 shot every 6 months

## 2022-07-03 NOTE — Assessment & Plan Note (Signed)
Anemia of chronic dz in past  Ckd   Cbc and iron added to labs today

## 2022-07-04 LAB — CBC WITH DIFFERENTIAL/PLATELET
Basophils Absolute: 0.1 10*3/uL (ref 0.0–0.1)
Basophils Relative: 1.1 % (ref 0.0–3.0)
Eosinophils Absolute: 0.1 10*3/uL (ref 0.0–0.7)
Eosinophils Relative: 2 % (ref 0.0–5.0)
HCT: 39.7 % (ref 36.0–46.0)
Hemoglobin: 12.8 g/dL (ref 12.0–15.0)
Lymphocytes Relative: 33.3 % (ref 12.0–46.0)
Lymphs Abs: 2.2 10*3/uL (ref 0.7–4.0)
MCHC: 32.3 g/dL (ref 30.0–36.0)
MCV: 95.2 fl (ref 78.0–100.0)
Monocytes Absolute: 0.4 10*3/uL (ref 0.1–1.0)
Monocytes Relative: 5.3 % (ref 3.0–12.0)
Neutro Abs: 3.9 10*3/uL (ref 1.4–7.7)
Neutrophils Relative %: 58.3 % (ref 43.0–77.0)
Platelets: 274 10*3/uL (ref 150.0–400.0)
RBC: 4.17 Mil/uL (ref 3.87–5.11)
RDW: 15.3 % (ref 11.5–15.5)
WBC: 6.7 10*3/uL (ref 4.0–10.5)

## 2022-07-04 LAB — COMPREHENSIVE METABOLIC PANEL
ALT: 33 U/L (ref 0–35)
AST: 23 U/L (ref 0–37)
Albumin: 3.9 g/dL (ref 3.5–5.2)
Alkaline Phosphatase: 84 U/L (ref 39–117)
BUN: 20 mg/dL (ref 6–23)
CO2: 28 mEq/L (ref 19–32)
Calcium: 9.7 mg/dL (ref 8.4–10.5)
Chloride: 102 mEq/L (ref 96–112)
Creatinine, Ser: 1.17 mg/dL (ref 0.40–1.20)
GFR: 51.05 mL/min — ABNORMAL LOW (ref >60.00)
Glucose, Bld: 151 mg/dL — ABNORMAL HIGH (ref 70–99)
Potassium: 4.3 mEq/L (ref 3.5–5.1)
Sodium: 140 mEq/L (ref 135–145)
Total Bilirubin: 0.3 mg/dL (ref 0.2–1.2)
Total Protein: 6.8 g/dL (ref 6.0–8.3)

## 2022-07-04 LAB — VITAMIN D 25 HYDROXY (VIT D DEFICIENCY, FRACTURES): VITD: 62.52 ng/mL (ref 30.00–100.00)

## 2022-07-04 LAB — LIPID PANEL
Cholesterol: 113 mg/dL (ref 0–200)
HDL: 37.6 mg/dL — ABNORMAL LOW (ref >39.00)
LDL Cholesterol: 42 mg/dL (ref 0–99)
NonHDL: 75.05
Total CHOL/HDL Ratio: 3
Triglycerides: 166 mg/dL — ABNORMAL HIGH (ref 0.0–149.0)
VLDL: 33.2 mg/dL (ref 0.0–40.0)

## 2022-07-04 LAB — VITAMIN B12: Vitamin B-12: 237 pg/mL (ref 211–911)

## 2022-07-04 LAB — TSH: TSH: 0.08 u[IU]/mL — ABNORMAL LOW (ref 0.35–5.50)

## 2022-07-04 LAB — IRON: Iron: 33 ug/dL — ABNORMAL LOW (ref 42–145)

## 2022-07-04 LAB — HEMOGLOBIN A1C: Hgb A1c MFr Bld: 7.6 % — ABNORMAL HIGH (ref 4.6–6.5)

## 2022-07-07 ENCOUNTER — Telehealth: Payer: Self-pay | Admitting: *Deleted

## 2022-07-07 MED ORDER — LEVOTHYROXINE SODIUM 112 MCG PO TABS: 112.0000 ug | ORAL_TABLET | Freq: Every day | ORAL | 1 refills | Status: DC

## 2022-07-07 NOTE — Telephone Encounter (Signed)
Pt notified of lab results and Dr. Royden Purl comments. Pt also viewed results on mychart. New dose of meds sent in and lab appt and f/u appt with PCP scheduled

## 2022-07-07 NOTE — Telephone Encounter (Signed)
-----   Message from Judy Pimple, MD sent at 07/04/2022  6:26 PM EDT ----- We need to go down on levothyroxine from 125 mcg daily to 112 mcg and re check TSH in 4-6 weeks Please send in #30 with 1 refill and change on med list Stable cholesterol A1c is down to 7.6 - improving  Please monitor blood sugar and follow up in 3 months  Iron is a little low -continue your daily iron (let us know if you stopped it) Continue vitamin B12 shots (level is lower but still in the norrmal range) -I think you get them every 6 months now

## 2022-07-17 ENCOUNTER — Other Ambulatory Visit: Payer: Self-pay | Admitting: Family Medicine

## 2022-07-24 ENCOUNTER — Other Ambulatory Visit: Payer: Self-pay | Admitting: Internal Medicine

## 2022-08-05 ENCOUNTER — Other Ambulatory Visit: Payer: Self-pay | Admitting: Family Medicine

## 2022-08-06 ENCOUNTER — Encounter: Payer: Self-pay | Admitting: Pharmacist

## 2022-08-06 ENCOUNTER — Telehealth: Payer: Self-pay | Admitting: Family Medicine

## 2022-08-06 ENCOUNTER — Other Ambulatory Visit: Payer: Self-pay | Admitting: Gastroenterology

## 2022-08-06 DIAGNOSIS — E118 Type 2 diabetes mellitus with unspecified complications: Secondary | ICD-10-CM

## 2022-08-06 DIAGNOSIS — E1169 Type 2 diabetes mellitus with other specified complication: Secondary | ICD-10-CM

## 2022-08-06 DIAGNOSIS — E039 Hypothyroidism, unspecified: Secondary | ICD-10-CM

## 2022-08-06 NOTE — Telephone Encounter (Signed)
-----   Message from Alvina Chou sent at 07/24/2022 12:06 PM EDT ----- Regarding: Lab orders for Monday, 6.24.24 Lab orders for tsh? thanks

## 2022-08-07 ENCOUNTER — Other Ambulatory Visit (INDEPENDENT_AMBULATORY_CARE_PROVIDER_SITE_OTHER): Payer: 59

## 2022-08-07 DIAGNOSIS — E039 Hypothyroidism, unspecified: Secondary | ICD-10-CM

## 2022-08-07 LAB — TSH: TSH: 0.11 u[IU]/mL — ABNORMAL LOW (ref 0.35–5.50)

## 2022-08-07 NOTE — Telephone Encounter (Signed)
Please change to 100 mcg daily  See result note Thanks

## 2022-08-07 NOTE — Telephone Encounter (Signed)
Refill request but pt had f/u labs today and it looks like TSH still abnormal, please advise

## 2022-08-08 ENCOUNTER — Telehealth: Payer: Self-pay

## 2022-08-08 LAB — HM DIABETES EYE EXAM

## 2022-08-08 MED ORDER — LEVOTHYROXINE SODIUM 100 MCG PO TABS: 100.0000 ug | ORAL_TABLET | Freq: Every day | ORAL | 1 refills | Status: DC

## 2022-08-08 NOTE — Telephone Encounter (Signed)
-----   Message from Judy Pimple, MD sent at 08/07/2022  7:20 PM EDT ----- TSH is still low (getting too much thyroid hormone) Currently on levothyroxine  I need to cut it to 100 mcg (please send #30 1 refill) Re check TSH 1 month

## 2022-08-11 ENCOUNTER — Other Ambulatory Visit (HOSPITAL_COMMUNITY): Payer: Self-pay

## 2022-08-11 NOTE — Telephone Encounter (Signed)
Per test claim Greggory Keen is covered without a PA.

## 2022-08-14 MED ORDER — MOUNJARO 10 MG/0.5ML ~~LOC~~ SOAJ
10.0000 mg | SUBCUTANEOUS | 0 refills | Status: DC
Start: 2022-08-14 — End: 2022-09-12

## 2022-08-14 NOTE — Addendum Note (Signed)
Addended by: Cheree Ditto on: 08/14/2022 10:24 AM   Modules accepted: Orders

## 2022-08-16 ENCOUNTER — Encounter: Payer: Self-pay | Admitting: Family Medicine

## 2022-08-28 ENCOUNTER — Encounter: Payer: Self-pay | Admitting: Family Medicine

## 2022-08-28 ENCOUNTER — Ambulatory Visit: Payer: 59 | Admitting: Family Medicine

## 2022-08-28 VITALS — BP 116/68 | HR 74 | Temp 97.8°F | Ht 62.0 in | Wt 180.2 lb

## 2022-08-28 DIAGNOSIS — H539 Unspecified visual disturbance: Secondary | ICD-10-CM

## 2022-08-28 DIAGNOSIS — E039 Hypothyroidism, unspecified: Secondary | ICD-10-CM | POA: Diagnosis not present

## 2022-08-28 DIAGNOSIS — H6990 Unspecified Eustachian tube disorder, unspecified ear: Secondary | ICD-10-CM | POA: Insufficient documentation

## 2022-08-28 DIAGNOSIS — H6993 Unspecified Eustachian tube disorder, bilateral: Secondary | ICD-10-CM | POA: Diagnosis not present

## 2022-08-28 LAB — TSH: TSH: 0.86 u[IU]/mL (ref 0.35–5.50)

## 2022-08-28 MED ORDER — FLUTICASONE PROPIONATE 50 MCG/ACT NA SUSP: 2.0000 | Freq: Every day | NASAL | 6 refills | Status: DC

## 2022-08-28 NOTE — Assessment & Plan Note (Signed)
Recent  Went to 20/40 bilateral per pt ? If possible worsening DM retinop from the GLP 1 med or something else Thinks she needs new glasses  Also history of cataracts No history of glaucoma Has oph exam planned next week

## 2022-08-28 NOTE — Patient Instructions (Addendum)
See the eye doctor as planned  ? If this is diabetes related or not   I think you have ETD causing the ear issues Use flonase as directed daily and let us know if not improved in 1-2 weeks   Thyroid labs today   Have a good trip!

## 2022-08-28 NOTE — Progress Notes (Signed)
Subjective:    Patient ID: Kelli Cruz, female    DOB: [birth year], 62 y.o.   MRN: [id number]  HPI  Wt Readings from Last 3 Encounters:  08/28/22 180 lb 4 oz (81.8 kg)  07/03/22 184 lb 8 oz (83.7 kg)  06/07/22 186 lb (84.4 kg)   32.97 kg/m  Vitals:   08/28/22 1153  BP: 116/68  Pulse: 74  Temp: 97.8 F (36.6 C)  SpO2: 98%   Pt presents with ear issues and for thyroid check   Pt called early this month noting her ears feel different  Like there is water in ears/ hollow feeling  They feel full  Occational pain in ears   Some popping   Nose has been more runny also   Some discomfort on right side to swallow   Takes claritin  No nasal sprays    Vision has been worse - 20/40 recently  She has issues with glasses - has to tilt head up to see better (has progressive lenses)  Goes to eye doctor next week   Has known early cataracts    On mounjaro for DM2 Lab Results  Component Value Date   HGBA1C 7.6 (H) 07/03/2022     Sees nephrology    Lab Results  Component Value Date   TSH 0.11 (L) 08/07/2022   Went down from 112 to 100 mcg daily  Wants to check today (due for labs next week and will be out of town)      Patient Active Problem List   Diagnosis Date Noted   ETD (eustachian tube dysfunction) 08/28/2022   Vision changes 08/28/2022   Aortic atherosclerosis (HCC) 07/03/2022   Frequent UTI 03/23/2022   Chronic cough 06/08/2021   Class 1 obesity due to excess calories with serious comorbidity and body mass index (BMI) of 33.0 to 33.9 in adult 05/11/2021   Irregular heart beat 05/11/2021   Loose stools 04/10/2020   History of total abdominal hysterectomy 01/22/2020   At high risk for breast cancer 01/22/2020   Hypertensive disorder 01/22/2020   Fatigue 09/12/2019   Leg cramps 08/15/2019   Chronic kidney disease, stage 3b (HCC) 02/03/2019   History of GI bleed 12/29/2018   Kidney stone 07/23/2018   Anemia 02/19/2017   Vitamin D deficiency  02/19/2017   Knee pain, right 11/10/2016   Proteinuria 02/18/2016   Exposure to communicable disease 08/04/2015   Hypersomnia with sleep apnea 01/06/2015   Insomnia due to mental condition    Tingling 08/12/2014   Burning sensation of mouth 08/12/2014   Numbness 10/24/2011   Routine general medical examination at a health care facility 10/24/2011   TMJ arthralgia 08/14/2011   Poor posture 07/26/2011   B12 deficiency 04/13/2010   History of colon cancer 08/11/2008   Hyperlipidemia associated with type 2 diabetes mellitus (HCC) 07/08/2008   Hypothyroidism 09/12/2006   Controlled diabetes mellitus type 2 with complications (HCC) 09/12/2006   Depression with anxiety 09/12/2006   ALLERGIC RHINITIS, SEASONAL 09/12/2006   FIBROCYSTIC BREAST DISEASE 09/12/2006   MIGRAINES, HX OF 09/12/2006   Past Medical History:  Diagnosis Date   Allergy 2013   When took medicine for overactive bladder   Anemia    Anxiety    Arthritis    Bipolar 1 disorder (HCC)    Blood transfusion without reported diagnosis 12/2018   Cancer [facility])    sigmoid colon cancer   Chronic kidney disease    Clotting disorder (HCC) 12/2018   Bleeding ulcer  Depression    Diabetes mellitus    type II   Endometriosis    Family history of malignant neoplasm of gastrointestinal tract    Ganglion cyst    GERD (gastroesophageal reflux disease)    Hyperlipidemia    Hypothyroidism    Insomnia due to mental condition    Irregular heart beat    Myocardial infarction [facility]) 2023   Sleep apnea    Ulcer 12/2018   Bleeding ulcer   Past Surgical History:  Procedure Laterality Date   ABDOMINAL HYSTERECTOMY     ABDOMINAL HYSTERECTOMY     BIOPSY  12/31/2018   Procedure: BIOPSY;  Surgeon: Sherrilyn Rist, MD;  Location: [facility] ENDOSCOPY;  Service: Gastroenterology;;   BREAST SURGERY     Had a small lump in left breast checked. Was not cancer   COLON RESECTION  07/14/2008   COLON SURGERY     COLONOSCOPY     DILATION AND  CURETTAGE OF UTERUS  02/14/2004   miscarriage    ESOPHAGOGASTRODUODENOSCOPY N/A 12/31/2018   Procedure: ESOPHAGOGASTRODUODENOSCOPY (EGD);  Surgeon: Sherrilyn Rist, MD;  Location: [facility] ENDOSCOPY;  Service: Gastroenterology;  Laterality: N/A;   GANGLION CYST EXCISION     HERNIA REPAIR     INTERSTIM IMPLANT PLACEMENT Left 12/11/2012   stimulator is on the left but the electrodes go to the right   KNEE DISLOCATION SURGERY     LAPAROSCOPY  02/14/1995   D & C for endometriosis   Social History   Tobacco Use   Smoking status: Never    Passive exposure: Yes   Smokeless tobacco: Never  Vaping Use   Vaping status: Never Used  Substance Use Topics   Alcohol use: Yes    Comment: 1-2 / year   Drug use: No   Family History  Problem Relation Age of Onset   Breast cancer Mother    Diabetes Mother    Coronary artery disease Mother    Kidney disease Mother        renal insufficiency   Hyperlipidemia Mother    Anxiety disorder Mother    Arthritis Mother    Cancer Mother    Depression Mother    Heart disease Mother    Miscarriages / Stillbirths Mother    Obesity Mother    Diabetes Father    Coronary artery disease Father    Colon cancer Father    Anxiety disorder Father    Arthritis Father    Hearing loss Father    Colon cancer Paternal Grandmother    Breast cancer Maternal Aunt    Breast cancer Paternal Aunt    Anxiety disorder Brother    Depression Brother    Diabetes Brother    Kidney disease Brother    Cancer Maternal Aunt    Depression Brother    Diabetes Brother    Early death Brother    Esophageal cancer Neg Hx    Rectal cancer Neg Hx    Stomach cancer Neg Hx    Allergies  Allergen Reactions   Ace Inhibitors Cough   Keflex [Cephalexin] Nausea And Vomiting and Other (See Comments)    Light-headed/dizziness/weakness   Mirabegron Other (See Comments)    Trouble breathing and swollen tongue   Current Outpatient Medications on File Prior to Visit  Medication Sig  Dispense Refill   ALPRAZolam (XANAX) 0.5 MG tablet Take 0.5 mg by mouth at bedtime as needed for anxiety (panic attack).      amphetamine-dextroamphetamine (ADDERALL XR) 30 MG  24 hr capsule Take 30 mg by mouth daily at 6 (six) AM.      amphetamine-dextroamphetamine (ADDERALL) 15 MG tablet Take 15 mg by mouth daily at 2 PM.      aspirin EC 81 MG tablet Take 1 tablet (81 mg total) by mouth daily. Swallow whole. 30 tablet 11   atorvastatin (LIPITOR) 40 MG tablet Take 1 tablet (40 mg total) by mouth daily. 90 tablet 3   Blood Glucose Monitoring Suppl (ONETOUCH VERIO FLEX SYSTEM) w/Device KIT USE TO CHECK BLOOD SUGAR TWICE A DAY (DX. E11.9) 1 kit 0   cariprazine (VRAYLAR) capsule Take 3 mg by mouth every 3 (three) days.      Cholecalciferol (VITAMIN D3) 125 MCG (5000 UT) CAPS Take 5,000 Units by mouth daily.     citalopram (CELEXA) 40 MG tablet Take 40 mg by mouth at bedtime.      cyanocobalamin (,VITAMIN B-12,) 1000 MCG/ML injection Inject 1 mL (1,000 mcg total) into the muscle every 3 (three) months. every 8 weeks 1 mL 3   Cyanocobalamin (VITAMIN B-12) 5000 MCG TBDP Take 5,000 mcg by mouth daily.     doxepin (SINEQUAN) 50 MG capsule Take 50 mg by mouth at bedtime.   1   empagliflozin (JARDIANCE) 25 MG TABS tablet Take 1 tablet (25 mg total) by mouth daily before breakfast. 90 tablet 3   famotidine (PEPCID) 20 MG tablet      ferrous sulfate 325 (65 FE) MG tablet Take 325 mg by mouth daily with breakfast.     gabapentin (NEURONTIN) 300 MG capsule TAKE 1 CAPSULE TWICE DAILY 180 capsule 1   glipiZIDE (GLUCOTROL XL) 10 MG 24 hr tablet Take 1 tablet (10 mg total) by mouth daily with breakfast. 90 tablet 3   glucose blood (ONETOUCH VERIO) test strip Use to check blood sugar twice daily 200 strip 1   Lancets (ONETOUCH DELICA PLUS LANCET33G) MISC USE TO CHECK BLOOD SUGAR TWICE A DAY 200 each 1   levothyroxine (SYNTHROID) 100 MCG tablet Take 1 tablet (100 mcg total) by mouth daily before breakfast. 30 tablet  1   loratadine (CLARITIN) 10 MG tablet TAKE 1 TABLET BY MOUTH EVERY DAY 90 tablet 1   losartan (COZAAR) 25 MG tablet Take 1 tablet (25 mg total) by mouth daily. 90 tablet 3   metFORMIN (GLUCOPHAGE) 1000 MG tablet TAKE 1/2 TABLET TWICE A DAYWITH MEALS 90 tablet 2   metoprolol succinate (TOPROL XL) 25 MG 24 hr tablet Take 1 tablet (25 mg total) by mouth daily. 90 tablet 3   pantoprazole (PROTONIX) 40 MG tablet TAKE 1 TABLET EVERY MORNING 90 tablet 0   tirzepatide (MOUNJARO) 10 MG/0.5ML Pen Inject 10 mg into the skin once a week. 2 mL 0   triamcinolone cream (KENALOG) 0.1 % Apply 1 Application topically 2 (two) times daily. To affected area 30 g 0   No current facility-administered medications on file prior to visit.    Review of Systems  Constitutional:  Negative for activity change, appetite change, fatigue, fever and unexpected weight change.  HENT:  Positive for ear pain and hearing loss. Negative for congestion, ear discharge, rhinorrhea, sinus pressure and sore throat.   Eyes:  Positive for visual disturbance. Negative for pain and redness.  Respiratory:  Negative for cough, shortness of breath and wheezing.   Cardiovascular:  Negative for chest pain and palpitations.  Gastrointestinal:  Negative for abdominal pain, blood in stool, constipation and diarrhea.  Endocrine: Negative for polydipsia and polyuria.  Genitourinary:  Negative for dysuria, frequency and urgency.  Musculoskeletal:  Negative for arthralgias, back pain and myalgias.  Skin:  Negative for pallor and rash.  Allergic/Immunologic: Negative for environmental allergies.  Neurological:  Negative for dizziness, syncope and headaches.  Hematological:  Negative for adenopathy. Does not bruise/bleed easily.  Psychiatric/Behavioral:  Negative for decreased concentration and dysphoric mood. The patient is not nervous/anxious.        Objective:   Physical Exam Constitutional:      General: She is not in acute distress.     Appearance: Normal appearance. She is obese. She is not ill-appearing or diaphoretic.  HENT:     Head: Normocephalic and atraumatic.     Right Ear: Ear canal and external ear normal.     Left Ear: Ear canal and external ear normal.     Ears:     Comments: TMs are dull and retracted bilaterally   Scant cerumen    Nose:     Comments: Boggy nares  Mildly congested     Mouth/Throat:     Mouth: Mucous membranes are moist.     Pharynx: Oropharynx is clear.     Comments: Mild clear pnd Eyes:     General:        Right eye: No discharge.        Left eye: No discharge.     Conjunctiva/sclera: Conjunctivae normal.     Pupils: Pupils are equal, round, and reactive to light.  Cardiovascular:     Rate and Rhythm: Normal rate and regular rhythm.  Pulmonary:     Effort: No respiratory distress.     Breath sounds: Normal breath sounds. No wheezing.  Musculoskeletal:     Cervical back: Neck supple.  Lymphadenopathy:     Cervical: No cervical adenopathy.  Skin:    Findings: No erythema or rash.  Neurological:     Mental Status: She is alert.     Cranial Nerves: No cranial nerve deficit.     Sensory: No sensory deficit.  Psychiatric:        Mood and Affect: Mood normal.           Assessment & Plan:   Problem List Items Addressed This Visit       Endocrine   Hypothyroidism    Lab Results  Component Value Date   TSH 0.11 (L) 08/07/2022   We went down on levothyroxine to 100 mcg Some fatigue Labs today      Relevant Orders   TSH     Nervous and Auditory   ETD (eustachian tube dysfunction) - Primary    Bilateral  Some rhinorrhea and congestion   Encouraged to start back on flonase ns daily  Update if not starting to improve in a week or if worsening   Earlier if pain or other symptoms  Exam is reassuring today         Other   Vision changes    Recent  Went to 20/40 bilateral per pt ? If possible worsening DM retinop from the GLP 1 med or something  else Thinks she needs new glasses  Also history of cataracts No history of glaucoma Has oph exam planned next week

## 2022-08-28 NOTE — Assessment & Plan Note (Signed)
Lab Results  Component Value Date   TSH 0.11 (L) 08/07/2022   We went down on levothyroxine to 100 mcg Some fatigue Labs today

## 2022-08-28 NOTE — Assessment & Plan Note (Signed)
Bilateral  Some rhinorrhea and congestion   Encouraged to start back on flonase ns daily  Update if not starting to improve in a week or if worsening   Earlier if pain or other symptoms  Exam is reassuring today

## 2022-08-30 ENCOUNTER — Other Ambulatory Visit: Payer: Self-pay | Admitting: Family Medicine

## 2022-09-06 ENCOUNTER — Telehealth: Payer: Self-pay | Admitting: Family Medicine

## 2022-09-06 DIAGNOSIS — E039 Hypothyroidism, unspecified: Secondary | ICD-10-CM

## 2022-09-06 NOTE — Telephone Encounter (Signed)
-----   Message from Alvina Chou sent at 08/18/2022  3:49 PM EDT ----- Regarding: Lab orders for Friday, 7.26.24 Lab orders, thanks, t

## 2022-09-08 ENCOUNTER — Other Ambulatory Visit: Payer: 59

## 2022-09-12 ENCOUNTER — Other Ambulatory Visit: Payer: Self-pay | Admitting: Internal Medicine

## 2022-09-12 DIAGNOSIS — E1169 Type 2 diabetes mellitus with other specified complication: Secondary | ICD-10-CM

## 2022-09-12 DIAGNOSIS — E118 Type 2 diabetes mellitus with unspecified complications: Secondary | ICD-10-CM

## 2022-09-13 ENCOUNTER — Encounter: Payer: Self-pay | Admitting: Pharmacist

## 2022-09-13 ENCOUNTER — Encounter (INDEPENDENT_AMBULATORY_CARE_PROVIDER_SITE_OTHER): Payer: Self-pay

## 2022-09-13 ENCOUNTER — Encounter: Payer: Self-pay | Admitting: Family Medicine

## 2022-09-13 DIAGNOSIS — E118 Type 2 diabetes mellitus with unspecified complications: Secondary | ICD-10-CM

## 2022-09-13 DIAGNOSIS — H6993 Unspecified Eustachian tube disorder, bilateral: Secondary | ICD-10-CM

## 2022-09-13 DIAGNOSIS — E1169 Type 2 diabetes mellitus with other specified complication: Secondary | ICD-10-CM

## 2022-09-13 MED ORDER — MOUNJARO 10 MG/0.5ML ~~LOC~~ SOAJ
10.0000 mg | SUBCUTANEOUS | 0 refills | Status: DC
Start: 2022-09-13 — End: 2022-10-04

## 2022-09-29 ENCOUNTER — Other Ambulatory Visit: Payer: Self-pay | Admitting: Family Medicine

## 2022-10-03 ENCOUNTER — Other Ambulatory Visit: Payer: Self-pay | Admitting: Internal Medicine

## 2022-10-03 DIAGNOSIS — E1169 Type 2 diabetes mellitus with other specified complication: Secondary | ICD-10-CM

## 2022-10-03 DIAGNOSIS — E118 Type 2 diabetes mellitus with unspecified complications: Secondary | ICD-10-CM

## 2022-10-04 ENCOUNTER — Other Ambulatory Visit: Payer: Self-pay | Admitting: Oncology

## 2022-10-04 DIAGNOSIS — Z006 Encounter for examination for normal comparison and control in clinical research program: Secondary | ICD-10-CM

## 2022-10-04 MED ORDER — MOUNJARO 12.5 MG/0.5ML ~~LOC~~ SOAJ: 12.5000 mg | SUBCUTANEOUS | 0 refills | Status: DC

## 2022-10-04 NOTE — Addendum Note (Signed)
Addended by: Cheree Ditto on: 10/04/2022 04:14 PM   Modules accepted: Orders

## 2022-10-10 ENCOUNTER — Ambulatory Visit: Payer: 59 | Admitting: Family Medicine

## 2022-10-10 ENCOUNTER — Encounter: Payer: Self-pay | Admitting: Family Medicine

## 2022-10-10 VITALS — BP 128/78 | HR 86 | Temp 97.9°F | Ht 62.0 in | Wt 181.4 lb

## 2022-10-10 DIAGNOSIS — E1169 Type 2 diabetes mellitus with other specified complication: Secondary | ICD-10-CM | POA: Diagnosis not present

## 2022-10-10 DIAGNOSIS — Z7985 Long-term (current) use of injectable non-insulin antidiabetic drugs: Secondary | ICD-10-CM

## 2022-10-10 DIAGNOSIS — Z23 Encounter for immunization: Secondary | ICD-10-CM | POA: Diagnosis not present

## 2022-10-10 DIAGNOSIS — I10 Essential (primary) hypertension: Secondary | ICD-10-CM

## 2022-10-10 DIAGNOSIS — E118 Type 2 diabetes mellitus with unspecified complications: Secondary | ICD-10-CM | POA: Diagnosis not present

## 2022-10-10 DIAGNOSIS — E039 Hypothyroidism, unspecified: Secondary | ICD-10-CM | POA: Diagnosis not present

## 2022-10-10 DIAGNOSIS — N1832 Chronic kidney disease, stage 3b: Secondary | ICD-10-CM

## 2022-10-10 DIAGNOSIS — E785 Hyperlipidemia, unspecified: Secondary | ICD-10-CM

## 2022-10-10 LAB — POCT GLYCOSYLATED HEMOGLOBIN (HGB A1C): Hemoglobin A1C: 7 % — AB (ref 4.0–5.6)

## 2022-10-10 NOTE — Assessment & Plan Note (Signed)
Lab Results  Component Value Date   HGBA1C 7.0 (A) 10/10/2022   Improving  Continues mounjaro at 10 mg weekly , will increase to 12.5 mg in another week  Jardiance 25 mg daily  Metformin 500 mg bid  Watched also by nephrology  Discussed goals for diet/exercise  Taking statin and arb

## 2022-10-10 NOTE — Progress Notes (Signed)
Subjective:    Patient ID: Kelli Cruz, female    DOB: 1961/01/20, 62 y.o.   MRN: [id number]  HPI  Wt Readings from Last 3 Encounters:  10/10/22 181 lb 6 oz (82.3 kg)  08/28/22 180 lb 4 oz (81.8 kg)  07/03/22 184 lb 8 oz (83.7 kg)   33.17 kg/m  Vitals:   10/10/22 1550  BP: 128/78  Pulse: 86  Temp: 97.9 F (36.6 C)  SpO2: 97%    Pt presents for follow up of DM2 and chronic medical problems   Working A little travel   Feeling pretty good   Brother has heart and kidney issues- helping with him  She gets tired at times      HTN bp is stable today  No cp or palpitations or headaches or edema  No side effects to medicines  BP Readings from Last 3 Encounters:  10/10/22 128/78  08/28/22 116/68  07/03/22 120/70     Losartan 25 mg daily  Metoprolol xl 12.5 mg daily   Lab Results  Component Value Date   NA 140 07/03/2022   K 4.3 07/03/2022   CO2 28 07/03/2022   GLUCOSE 151 (H) 07/03/2022   BUN 20 07/03/2022   CREATININE 1.17 07/03/2022   CALCIUM 9.7 07/03/2022   GFR 51.05 (L) 07/03/2022   EGFR 44 (L) 12/02/2021   GFRNONAA 38 (L) 06/03/2019   DM2 Lab Results  Component Value Date   HGBA1C 7.0 (A) 10/10/2022   Lab Results  Component Value Date   HGBA1C 7.0 (A) 10/10/2022   Making progress  Semaglutide, then mounjaro due to availability  10  mg weekly Has not noticed much difference  Will go up to 12.5 next week  Jardiance 25 mg  Metformiin 500 mg bid   Occational tingling in feet but not numb   Some glucose readings in 90s  105-135 in the am fasting  One of 186 - highest   Appetite is about the same   Is trying to eat healthy  Making effort when she can Occational fast food/not often  Smaller portions   Exercise -has not started a regimen  Has a gazelle -using for 10 minutes at a time  Wants to get more regular  Had eye exam-sent for report    Sees nephrologist for nephropathy so we do not do microalb  On statin and arb    Hyperlipidemia Lab Results  Component Value Date   CHOL 113 07/03/2022   HDL 37.60 (L) 07/03/2022   LDLCALC 42 07/03/2022   LDLDIRECT 104.0 05/11/2021   TRIG 166.0 (H) 07/03/2022   CHOLHDL 3 07/03/2022   Atorvastatin 40 mg daily   Hypothyroid Lab Results  Component Value Date   TSH 0.86 08/28/2022   Levothyroxine 100 mcg daily   Mammogram is scheduled for October  Had her eye exam    Patient Active Problem List   Diagnosis Date Noted   ETD (eustachian tube dysfunction) 08/28/2022   Vision changes 08/28/2022   Aortic atherosclerosis (HCC) 07/03/2022   Frequent UTI 03/23/2022   Chronic cough 06/08/2021   Class 1 obesity due to excess calories with serious comorbidity and body mass index (BMI) of 33.0 to 33.9 in adult 05/11/2021   Irregular heart beat 05/11/2021   Loose stools 04/10/2020   History of total abdominal hysterectomy 01/22/2020   At high risk for breast cancer 01/22/2020   Hypertensive disorder 01/22/2020   Fatigue 09/12/2019   Leg cramps 08/15/2019   Chronic kidney  disease, stage 3b (HCC) 02/03/2019   History of GI bleed 12/29/2018   Kidney stone 07/23/2018   Anemia 02/19/2017   Vitamin D deficiency 02/19/2017   Knee pain, right 11/10/2016   Proteinuria 02/18/2016   Exposure to communicable disease 08/04/2015   Hypersomnia with sleep apnea 01/06/2015   Insomnia due to mental condition    Tingling 08/12/2014   Burning sensation of mouth 08/12/2014   Numbness 10/24/2011   Routine general medical examination at a health care facility 10/24/2011   TMJ arthralgia 08/14/2011   Poor posture 07/26/2011   B12 deficiency 04/13/2010   History of colon cancer 08/11/2008   Hyperlipidemia associated with type 2 diabetes mellitus (HCC) 07/08/2008   Hypothyroidism 09/12/2006   Controlled diabetes mellitus type 2 with complications (HCC) 09/12/2006   Depression with anxiety 09/12/2006   ALLERGIC RHINITIS, SEASONAL 09/12/2006   FIBROCYSTIC BREAST DISEASE  09/12/2006   MIGRAINES, HX OF 09/12/2006   Past Medical History:  Diagnosis Date   Allergy 2013   When took medicine for overactive bladder   Anemia    Anxiety    Arthritis    Bipolar 1 disorder (HCC)    Blood transfusion without reported diagnosis 12/2018   Cancer [facility])    sigmoid colon cancer   Chronic kidney disease    Clotting disorder (HCC) 12/2018   Bleeding ulcer   Depression    Diabetes mellitus    type II   Endometriosis    Family history of malignant neoplasm of gastrointestinal tract    Ganglion cyst    GERD (gastroesophageal reflux disease)    Hyperlipidemia    Hypothyroidism    Insomnia due to mental condition    Irregular heart beat    Myocardial infarction (HCC) 2023   Sleep apnea    Ulcer 12/2018   Bleeding ulcer   Past Surgical History:  Procedure Laterality Date   ABDOMINAL HYSTERECTOMY     ABDOMINAL HYSTERECTOMY     BIOPSY  12/31/2018   Procedure: BIOPSY;  Surgeon: Sherrilyn Rist, MD;  Location: [facility] ENDOSCOPY;  Service: Gastroenterology;;   BREAST SURGERY     Had a small lump in left breast checked. Was not cancer   COLON RESECTION  07/14/2008   COLON SURGERY     COLONOSCOPY     DILATION AND CURETTAGE OF UTERUS  02/14/2004   miscarriage    ESOPHAGOGASTRODUODENOSCOPY N/A 12/31/2018   Procedure: ESOPHAGOGASTRODUODENOSCOPY (EGD);  Surgeon: Sherrilyn Rist, MD;  Location: [facility] ENDOSCOPY;  Service: Gastroenterology;  Laterality: N/A;   GANGLION CYST EXCISION     HERNIA REPAIR     INTERSTIM IMPLANT PLACEMENT Left 12/11/2012   stimulator is on the left but the electrodes go to the right   KNEE DISLOCATION SURGERY     LAPAROSCOPY  02/14/1995   D & C for endometriosis   Social History   Tobacco Use   Smoking status: Never    Passive exposure: Yes   Smokeless tobacco: Never  Vaping Use   Vaping status: Never Used  Substance Use Topics   Alcohol use: Yes    Comment: 1-2 / year   Drug use: No   Family History  Problem Relation Age of  Onset   Breast cancer Mother    Diabetes Mother    Coronary artery disease Mother    Kidney disease Mother        renal insufficiency   Hyperlipidemia Mother    Anxiety disorder Mother    Arthritis Mother  Cancer Mother    Depression Mother    Heart disease Mother    Miscarriages / Stillbirths Mother    Obesity Mother    Diabetes Father    Coronary artery disease Father    Colon cancer Father    Anxiety disorder Father    Arthritis Father    Hearing loss Father    Colon cancer Paternal Grandmother    Breast cancer Maternal Aunt    Breast cancer Paternal Aunt    Anxiety disorder Brother    Depression Brother    Diabetes Brother    Kidney disease Brother    Cancer Maternal Aunt    Depression Brother    Diabetes Brother    Early death Brother    Esophageal cancer Neg Hx    Rectal cancer Neg Hx    Stomach cancer Neg Hx    Allergies  Allergen Reactions   Ace Inhibitors Cough   Keflex [Cephalexin] Nausea And Vomiting and Other (See Comments)    Light-headed/dizziness/weakness   Mirabegron Other (See Comments)    Trouble breathing and swollen tongue   Current Outpatient Medications on File Prior to Visit  Medication Sig Dispense Refill   ALPRAZolam (XANAX) 0.5 MG tablet Take 0.5 mg by mouth at bedtime as needed for anxiety (panic attack).      amphetamine-dextroamphetamine (ADDERALL XR) 30 MG 24 hr capsule Take 30 mg by mouth daily at 6 (six) AM.      amphetamine-dextroamphetamine (ADDERALL) 15 MG tablet Take 15 mg by mouth daily at 2 PM.      aspirin EC 81 MG tablet Take 1 tablet (81 mg total) by mouth daily. Swallow whole. 30 tablet 11   atorvastatin (LIPITOR) 40 MG tablet Take 1 tablet (40 mg total) by mouth daily. 90 tablet 3   Blood Glucose Monitoring Suppl (ONETOUCH VERIO FLEX SYSTEM) w/Device KIT USE TO CHECK BLOOD SUGAR TWICE A DAY (DX. E11.9) 1 kit 0   cariprazine (VRAYLAR) capsule Take 3 mg by mouth every 3 (three) days.      Cholecalciferol (VITAMIN D3) 125  MCG (5000 UT) CAPS Take 5,000 Units by mouth daily.     citalopram (CELEXA) 40 MG tablet Take 40 mg by mouth at bedtime.      cyanocobalamin (,VITAMIN B-12,) 1000 MCG/ML injection Inject 1 mL (1,000 mcg total) into the muscle every 3 (three) months. every 8 weeks 1 mL 3   Cyanocobalamin (VITAMIN B-12) 5000 MCG TBDP Take 5,000 mcg by mouth daily.     doxepin (SINEQUAN) 50 MG capsule Take 50 mg by mouth at bedtime.   1   empagliflozin (JARDIANCE) 25 MG TABS tablet Take 1 tablet (25 mg total) by mouth daily before breakfast. 90 tablet 3   famotidine (PEPCID) 20 MG tablet      ferrous sulfate 325 (65 FE) MG tablet Take 325 mg by mouth daily with breakfast.     fluticasone (FLONASE) 50 MCG/ACT nasal spray Place 2 sprays into both nostrils daily. 16 g 6   gabapentin (NEURONTIN) 300 MG capsule TAKE 1 CAPSULE TWICE DAILY 180 capsule 1   glucose blood (ONETOUCH VERIO) test strip Use to check blood sugar twice daily 200 strip 1   Lancets (ONETOUCH DELICA PLUS LANCET33G) MISC USE TO CHECK BLOOD SUGAR TWICE A DAY 200 each 1   levothyroxine (SYNTHROID) 100 MCG tablet TAKE 1 TABLET BY MOUTH DAILY BEFORE BREAKFAST. 90 tablet 2   loratadine (CLARITIN) 10 MG tablet TAKE 1 TABLET BY MOUTH EVERY DAY 90 tablet  1   losartan (COZAAR) 25 MG tablet Take 1 tablet (25 mg total) by mouth daily. 90 tablet 3   metFORMIN (GLUCOPHAGE) 1000 MG tablet TAKE 1/2 TABLET TWICE A DAYWITH MEALS 90 tablet 0   metoprolol succinate (TOPROL XL) 25 MG 24 hr tablet Take 1 tablet (25 mg total) by mouth daily. 90 tablet 3   pantoprazole (PROTONIX) 40 MG tablet TAKE 1 TABLET EVERY MORNING 90 tablet 0   tirzepatide (MOUNJARO) 12.5 MG/0.5ML Pen Inject 12.5 mg into the skin once a week. 2 mL 0   triamcinolone cream (KENALOG) 0.1 % Apply 1 Application topically 2 (two) times daily. To affected area 30 g 0   No current facility-administered medications on file prior to visit.    Review of Systems  Constitutional:  Negative for activity change,  appetite change, fatigue, fever and unexpected weight change.  HENT:  Negative for congestion, rhinorrhea, sore throat and trouble swallowing.   Eyes:  Negative for pain, redness, itching and visual disturbance.  Respiratory:  Negative for cough, chest tightness, shortness of breath and wheezing.   Cardiovascular:  Negative for chest pain and palpitations.  Gastrointestinal:  Negative for abdominal pain, blood in stool, constipation, diarrhea and nausea.  Endocrine: Negative for cold intolerance, heat intolerance, polydipsia and polyuria.  Genitourinary:  Negative for difficulty urinating, dysuria, frequency and urgency.  Musculoskeletal:  Negative for arthralgias, joint swelling and myalgias.  Skin:  Negative for pallor and rash.  Neurological:  Negative for dizziness, tremors, weakness, numbness and headaches.       Some tingling in feet   Hematological:  Negative for adenopathy. Does not bruise/bleed easily.  Psychiatric/Behavioral:  Negative for decreased concentration and dysphoric mood. The patient is not nervous/anxious.        Objective:   Physical Exam Constitutional:      General: She is not in acute distress.    Appearance: Normal appearance. She is well-developed. She is obese. She is not ill-appearing or diaphoretic.  HENT:     Head: Normocephalic and atraumatic.  Eyes:     Conjunctiva/sclera: Conjunctivae normal.     Pupils: Pupils are equal, round, and reactive to light.  Neck:     Thyroid: No thyromegaly.     Vascular: No carotid bruit or JVD.  Cardiovascular:     Rate and Rhythm: Normal rate and regular rhythm.     Heart sounds: Normal heart sounds.     No gallop.  Pulmonary:     Effort: Pulmonary effort is normal. No respiratory distress.     Breath sounds: Normal breath sounds. No wheezing or rales.  Abdominal:     General: There is no distension or abdominal bruit.     Palpations: Abdomen is soft.     Tenderness: There is no abdominal tenderness.   Musculoskeletal:     Cervical back: Normal range of motion and neck supple.     Right lower leg: No edema.     Left lower leg: No edema.  Lymphadenopathy:     Cervical: No cervical adenopathy.  Skin:    General: Skin is warm and dry.     Coloration: Skin is not pale.     Findings: No rash.  Neurological:     Mental Status: She is alert.     Coordination: Coordination normal.     Deep Tendon Reflexes: Reflexes are normal and symmetric. Reflexes normal.  Psychiatric:        Mood and Affect: Mood normal.  Assessment & Plan:   Problem List Items Addressed This Visit       Cardiovascular and Mediastinum   Hypertensive disorder    bp is stable today  No cp or palpitations or headaches or edema  No side effects to medicines  BP Readings from Last 3 Encounters:  10/10/22 128/78  08/28/22 116/68  07/03/22 120/70    Plan to continue Losartan 25 mg daily  Metoprolol xl 12.5 mg daily        Endocrine   Controlled diabetes mellitus type 2 with complications (HCC) - Primary    Lab Results  Component Value Date   HGBA1C 7.0 (A) 10/10/2022   Improving  Continues mounjaro at 10 mg weekly , will increase to 12.5 mg in another week  Jardiance 25 mg daily  Metformin 500 mg bid  Watched also by nephrology  Discussed goals for diet/exercise  Taking statin and arb       Relevant Orders   POCT HgB A1C (Completed)   Hyperlipidemia associated with type 2 diabetes mellitus (HCC)    Disc goals for lipids and reasons to control them Rev last labs with pt Rev low sat fat diet in detail  Lab planned in 3 mo (asked to eat light- has to come mid day)  Continues atorvastatin 40 mg daily       Hypothyroidism    Lab Results  Component Value Date   TSH 0.86 08/28/2022   Plan to continue levothyroxine 100 mcg daily          Genitourinary   Chronic kidney disease, stage 3b (HCC)    Continues nephrology f/u      Other Visit Diagnoses     Need for influenza  vaccination       Relevant Orders   Flu vaccine trivalent PF, 6mos and older(Flulaval,Afluria,Fluarix,Fluzone) (Completed)

## 2022-10-10 NOTE — Assessment & Plan Note (Signed)
Continues nephrology f/u

## 2022-10-10 NOTE — Patient Instructions (Addendum)
Aim to work up to 30 or more minutes of exercise 5 days per week Some cardio  Add some strength training to your routine, this is important for bone and brain health and can reduce your risk of falls and help your body use insulin properly and regulate weight  Light weights, exercise bands , and internet videos are a good way to start  Yoga (chair or regular), machines , floor exercises or a gym with machines are also good options     Go up on mounjaro (generic) as planned and let us know if any problems  If any low blood sugars (70 or below ) also let us know   Follow up in 3 months with labs prior (don't eat a lot for the 4 hours prior)   Check out some recipes for overnight oats     [facility] Lab Locations :  The lab will be ready for your visit 24 hours after your consent has been completed. No formal laboratory appointment is required. You do not need to fast for the sample collection. If you already have labs scheduled, you may be able to complete this step as part of your existing visit.   The laboratories currently offering walk-in appointments are:   [facility], [address] [place], [place], [place] [address].   Hours:?Monday-Friday, 10 a.m. - 10 p.m.   [facility], [address] [place], [place] [address]   Hours:?Monday-Friday, 8 a.m. - 4 p.m.   [facility], [address] [place], [place] [address].   Hours:?Monday-Friday, 8 a.m. - 4 p.m. Saturday-Sunday 9 a.m. - 2 p.m.   [facility], [address], [place], [place] [address].   Hours:?Monday-Friday, 8 a.m. - 4 p.m.    [facility], [place]. [address], [place], [place] [address] (Basement level)   Hours:?Monday-Friday, 7:30 a.m. to 5:30 p.m.   You must be a [facility] patient to use this lab.

## 2022-10-10 NOTE — Assessment & Plan Note (Signed)
bp is stable today  No cp or palpitations or headaches or edema  No side effects to medicines  BP Readings from Last 3 Encounters:  10/10/22 128/78  08/28/22 116/68  07/03/22 120/70    Plan to continue Losartan 25 mg daily  Metoprolol xl 12.5 mg daily

## 2022-10-10 NOTE — Assessment & Plan Note (Signed)
Disc goals for lipids and reasons to control them Rev last labs with pt Rev low sat fat diet in detail  Lab planned in 3 mo (asked to eat light- has to come mid day)  Continues atorvastatin 40 mg daily

## 2022-10-10 NOTE — Assessment & Plan Note (Signed)
Lab Results  Component Value Date   TSH 0.86 08/28/2022   Plan to continue levothyroxine 100 mcg daily

## 2022-10-19 ENCOUNTER — Other Ambulatory Visit (HOSPITAL_COMMUNITY)
Admission: RE | Admit: 2022-10-19 | Discharge: 2022-10-19 | Disposition: A | Discharge status: Home / Self Care | Payer: 59 | Source: Other Acute Inpatient Hospital | Attending: Oncology | Admitting: Oncology

## 2022-10-19 ENCOUNTER — Ambulatory Visit (INDEPENDENT_AMBULATORY_CARE_PROVIDER_SITE_OTHER): Payer: 59 | Admitting: Otolaryngology

## 2022-10-19 ENCOUNTER — Encounter (INDEPENDENT_AMBULATORY_CARE_PROVIDER_SITE_OTHER): Payer: Self-pay | Admitting: Otolaryngology

## 2022-10-19 VITALS — BP 109/73 | HR 75 | Ht 62.5 in | Wt 184.0 lb

## 2022-10-19 DIAGNOSIS — Z006 Encounter for examination for normal comparison and control in clinical research program: Secondary | ICD-10-CM | POA: Insufficient documentation

## 2022-10-19 DIAGNOSIS — H6993 Unspecified Eustachian tube disorder, bilateral: Secondary | ICD-10-CM

## 2022-10-19 DIAGNOSIS — R0981 Nasal congestion: Secondary | ICD-10-CM | POA: Diagnosis not present

## 2022-10-19 DIAGNOSIS — H8113 Benign paroxysmal vertigo, bilateral: Secondary | ICD-10-CM | POA: Diagnosis not present

## 2022-10-19 DIAGNOSIS — R42 Dizziness and giddiness: Secondary | ICD-10-CM

## 2022-10-19 DIAGNOSIS — H9193 Unspecified hearing loss, bilateral: Secondary | ICD-10-CM

## 2022-10-19 DIAGNOSIS — J3089 Other allergic rhinitis: Secondary | ICD-10-CM

## 2022-10-19 MED ORDER — METHYLPREDNISOLONE 4 MG PO TBPK: ORAL_TABLET | ORAL | 1 refills | Status: DC

## 2022-10-19 MED ORDER — DESLORATADINE 5 MG PO TABS: 5.0000 mg | ORAL_TABLET | Freq: Every day | ORAL | 3 refills | Status: DC

## 2022-10-19 MED ORDER — FLUTICASONE PROPIONATE 50 MCG/ACT NA SUSP: 2.0000 | Freq: Every day | NASAL | 6 refills | Status: DC

## 2022-10-19 NOTE — Progress Notes (Signed)
ENT CONSULT:  Reason for Consult: muffled hearing and sensation of fluid in the ears x 3 months   HPI: Kelli Cruz is an 62 y.o. female with hx T2DM, GERD on Famotidine, here for persistent sensation of fluid in her ears and muffled hearing x 3 months.  She initially developed sx 3 months ago, then saw PCP got Flonase and her sx began to improve. She does have environmental allergies with nasal congestion and rhinorrhea, never been tested for allergies or had allergy shots. She reports her hearing is muffled. She is able to pop her ears, and at times it provides relief. She noticed that a couple of days ago she had eft ear discomfort when using ear plugs. She reports some hearing loss on a recent hearing test at work. Gets tested annually. Was told it was mild and related to noise exposure. Unfortunately, results are not available to review.  She works around noise and gets annual hearing test. She is still on Flonase. No allergy testing. She uses CPAP at night. She has bilateral sx, left is worse. She has balance issues when getting up or standing up too fast, she gets seconds or minutes of vertigo. It self-resolves.   Records Reviewed:  08/28/22 Pt presents with ear issues and for thyroid check    Pt called early this month noting her ears feel different  Like there is water in ears/ hollow feeling  They feel full  Occational pain in ears    Some popping    Nose has been more runny also    Some discomfort on right side to swallow    Takes claritin  No nasal sprays  Diagnosed with ETD  Hx of OSA    Past Medical History:  Diagnosis Date   Allergy 2013   When took medicine for overactive bladder   Anemia    Anxiety    Arthritis    Bipolar 1 disorder (HCC)    Blood transfusion without reported diagnosis 12/2018   Cancer [facility])    sigmoid colon cancer   Chronic kidney disease    Clotting disorder (HCC) 12/2018   Bleeding ulcer   Depression    Diabetes mellitus    type II    Endometriosis    Family history of malignant neoplasm of gastrointestinal tract    Ganglion cyst    GERD (gastroesophageal reflux disease)    Hyperlipidemia    Hypothyroidism    Insomnia due to mental condition    Irregular heart beat    Myocardial infarction (HCC) 2023   Sleep apnea    Ulcer 12/2018   Bleeding ulcer    Past Surgical History:  Procedure Laterality Date   ABDOMINAL HYSTERECTOMY     ABDOMINAL HYSTERECTOMY     BIOPSY  12/31/2018   Procedure: BIOPSY;  Surgeon: Sherrilyn Rist, MD;  Location: [facility] ENDOSCOPY;  Service: Gastroenterology;;   BREAST SURGERY     Had a small lump in left breast checked. Was not cancer   COLON RESECTION  07/14/2008   COLON SURGERY     COLONOSCOPY     DILATION AND CURETTAGE OF UTERUS  02/14/2004   miscarriage    ESOPHAGOGASTRODUODENOSCOPY N/A 12/31/2018   Procedure: ESOPHAGOGASTRODUODENOSCOPY (EGD);  Surgeon: Sherrilyn Rist, MD;  Location: [facility] ENDOSCOPY;  Service: Gastroenterology;  Laterality: N/A;   GANGLION CYST EXCISION     HERNIA REPAIR     INTERSTIM IMPLANT PLACEMENT Left 12/11/2012   stimulator is on the left but the  electrodes go to the right   KNEE DISLOCATION SURGERY     LAPAROSCOPY  02/14/1995   D & C for endometriosis    Family History  Problem Relation Age of Onset   Breast cancer Mother    Diabetes Mother    Coronary artery disease Mother    Kidney disease Mother        renal insufficiency   Hyperlipidemia Mother    Anxiety disorder Mother    Arthritis Mother    Cancer Mother    Depression Mother    Heart disease Mother    Miscarriages / Stillbirths Mother    Obesity Mother    Diabetes Father    Coronary artery disease Father    Colon cancer Father    Anxiety disorder Father    Arthritis Father    Hearing loss Father    Colon cancer Paternal Grandmother    Breast cancer Maternal Aunt    Breast cancer Paternal Aunt    Anxiety disorder Brother    Depression Brother    Diabetes Brother    Kidney  disease Brother    Cancer Maternal Aunt    Depression Brother    Diabetes Brother    Early death Brother    Esophageal cancer Neg Hx    Rectal cancer Neg Hx    Stomach cancer Neg Hx     Social History:  reports that she has never smoked. She has been exposed to tobacco smoke. She has never used smokeless tobacco. She reports current alcohol use. She reports that she does not use drugs.  Allergies:  Allergies  Allergen Reactions   Ace Inhibitors Cough   Keflex [Cephalexin] Nausea And Vomiting and Other (See Comments)    Light-headed/dizziness/weakness   Mirabegron Other (See Comments)    Trouble breathing and swollen tongue    Medications: I have reviewed the patient's current medications.  The PMH, PSH, Medications, Allergies, and SH were reviewed and updated.  ROS: Constitutional: Negative for fever, weight loss and weight gain. Cardiovascular: Negative for chest pain and dyspnea on exertion. Respiratory: Is not experiencing shortness of breath at rest. Gastrointestinal: Negative for nausea and vomiting. Neurological: Negative for headaches. Psychiatric: The patient is not nervous/anxious  Blood pressure 109/73, pulse 75, height 5' 2.5" (1.588 m), weight 184 lb (83.5 kg), last menstrual period 07/15/2006, SpO2 96%.  PHYSICAL EXAM:  Exam: General: Well-developed, well-nourished Respiratory Respiratory effort: Equal inspiration and expiration without stridor Cardiovascular Peripheral Vascular: Warm extremities with equal color/perfusion Eyes: No nystagmus with equal extraocular motion bilaterally Neuro/Psych/Balance: Patient oriented to person, place, and time; Appropriate mood and affect; Gait is intact with no imbalance; Cranial nerves I-XII are intact Head and Face Inspection: Normocephalic and atraumatic without mass or lesion Palpation: Facial skeleton intact without bony stepoffs Salivary Glands: No mass or tenderness Facial Strength: Facial motility symmetric  and full bilaterally ENT Pinna: External ear intact and fully developed External canal: Canal is patent with intact skin Tympanic Membrane: Clear and mobile External Nose: No scar or anatomic deformity Internal Nose: Septum intact and midline. No edema, polyp, or rhinorrhea Lips, Teeth, and gums: Mucosa and teeth intact and viable TMJ: No pain to palpation with full mobility Oral cavity/oropharynx: No erythema or exudate, no lesions present Neck Neck and Trachea: Midline trachea without mass or lesion Thyroid: No mass or nodularity Lymphatics: No lymphadenopathy  Studies Reviewed:  2013 CT chest IMPRESSION:   1. Mild superior plate compression deformity at T11.  Likely  acute.  No significant  canal compromise.  2.  Otherwise, no acute or post-traumatic deformity identified. CT HEAD   Findings: The brain has a normal appearance without evidence of  atrophy, old or acute infarction, mass lesion, hemorrhage,  hydrocephalus or extra-axial collection.  No skull fracture.  No  fluid in the sinuses.   IMPRESSION:  Negative head CT   CT CERVICAL SPINE   Findings: Alignment is normal.  No soft tissue swelling.  There is  degenerative spondylosis at C5-6 and C6-7 with disc space narrowing  and marginal osteophytes.  There is facet degeneration most  pronounced on the right at C4-5 and on the left at C3-4.     Assessment/Plan: Encounter Diagnoses  Name Primary?   Vertigo Yes   Benign paroxysmal positional vertigo due to bilateral vestibular disorder    Decreased hearing of both ears    Dysfunction of both eustachian tubes    Nasal congestion    Environmental and seasonal allergies    62 year old female history of type 2 diabetes chronic back and cervical spine pain, degenerative changes on CT of the spine several years ago, history of suspected environmental allergies, no prior testing or allergy shots, who is here for evaluation of persistent left worse than right muffled  hearing, ear discomfort, and sensation of fluid in her ears.  Also reports seconds to minutes of vertigo triggered by standing up or moving too fast.  I suspect her symptoms are likely related to ETD in the setting of nasal congestion, especially since she reports improvement of her symptoms with both time and Flonase.  Her vertigo could be related to BPPV versus balance problems from cervicalgia and chronic neck problems.  Will refer to vestibular rehab for Dix-Hallpike possible Epley and other interventions.  We discussed management of ETD and muffled hearing and I advised the patient that otherwise her ear exam was unremarkable.  Will do a trial of Medrol Dosepak and continue Flonase.  Will start antihistamine.  Will perform hearing test including tympanograms after she completes steroids.  She will return after testing.   - Medrol pack, continue Flonase and start Clarinex - vestibular rehab  - hearing test - nasal endoscopy when she returns   Thank you for allowing me to participate in the care of this patient. Please do not hesitate to contact me with any questions or concerns.   Ashok Croon, MD Otolaryngology [facility] Phone: [phone] Fax: [phone]    10/19/2022, 7:36 PM

## 2022-10-19 NOTE — Patient Instructions (Signed)

## 2022-10-23 ENCOUNTER — Other Ambulatory Visit: Payer: Self-pay | Admitting: *Deleted

## 2022-10-23 MED ORDER — METOPROLOL SUCCINATE ER 25 MG PO TB24: 12.5000 mg | ORAL_TABLET | Freq: Every day | ORAL | 0 refills | Status: DC

## 2022-10-24 ENCOUNTER — Telehealth: Payer: Self-pay | Admitting: *Deleted

## 2022-10-24 LAB — GENECONNECT MOLECULAR SCREEN

## 2022-10-24 NOTE — Telephone Encounter (Signed)
Research officer, trade union received notification today that this participant's sample was -TNP- test not performed.  The participant was contacted about needing to provide another sample.  The participant informed the nurse that she went to the lab to have her sample obtained on 10/19/22, but her sample was not collected.  The pt said that she was contacted by the laboratory staff.  She was thanked for her participation in the study and for her efforts to have her blood obtained.  The participant was told that at the moment, the study is under internal review and temporarily unable to process new samples.  The participant was told that she would be contacted when she is able to provide a new sample for this study.  The participant thanked the nurse for this additional information.  She was told that if she has any questions, to contact [employer] at [email or web address] for [phone].   Janan Ridge RN, BSN, CCRP Clinical Research Nurse Lead 10/24/2022 4:01 PM

## 2022-10-28 ENCOUNTER — Other Ambulatory Visit: Payer: Self-pay | Admitting: Gastroenterology

## 2022-10-31 ENCOUNTER — Other Ambulatory Visit: Payer: Self-pay | Admitting: Internal Medicine

## 2022-10-31 DIAGNOSIS — E118 Type 2 diabetes mellitus with unspecified complications: Secondary | ICD-10-CM

## 2022-10-31 DIAGNOSIS — E1169 Type 2 diabetes mellitus with other specified complication: Secondary | ICD-10-CM

## 2022-11-01 ENCOUNTER — Encounter: Payer: Self-pay | Admitting: Internal Medicine

## 2022-11-17 NOTE — Progress Notes (Unsigned)
Cardiology Office Note:   Date:  11/20/2022  ID:  Kelli Cruz, DOB 01-08-1961, MRN [id number] PCP:  Judy Pimple, MD  [facility] Providers Cardiologist:  Alverda Skeans, MD Referring MD: Judy Pimple, MD   Chief Complaint/Reason for Referral: Cardiology follow-up ASSESSMENT:    1. Type 2 diabetes mellitus with complication, without long-term current use of insulin (HCC)   2. CKD (chronic kidney disease) stage 2, GFR 60-89 ml/min   3. Hyperlipidemia associated with type 2 diabetes mellitus (HCC)   4. Hypertension associated with diabetes (HCC)   5. BMI 38.0-38.9,adult   6. Aortic atherosclerosis (HCC)   7. OSA (obstructive sleep apnea)     PLAN:   In order of problems listed above: 1.  Type 2 diabetes: Continue aspirin, atorvastatin 40 mg, Jardiance 25 mg, Mounjaro, losartan 25 mg.   2.  CKD stage II: Continue Jardiance and losartan for renal protection. 3.  Hyperlipidemia: Check lipid panel and LFTs today. 4.  Hypertension: Blood pressure is well-controlled today. 5.  Elevated BMI: Continue Mounjaro  6.  Aortic atherosclerosis: Continue aspirin, statin, and strict blood pressure control. 7.  Obstructive sleep apnea: Continue CPAP.      Dispo:  Return in about 9 months (around 08/20/2023).      Medication Adjustments/Labs and Tests Ordered: Current medicines are reviewed at length with the patient today.  Concerns regarding medicines are outlined above.  The following changes have been made:  No change  Labs/tests ordered: Orders Placed This Encounter  Procedures   Lipid panel   Hepatic function panel    Medication Changes: No orders of the defined types were placed in this encounter.   Current medicines are reviewed at length with the patient today.  The patient does not have concerns regarding medicines.  History of Present Illness:      FOCUSED PROBLEM LIST:   Type 2 diabetes on metformin Hypertension Hyperlipidemia; low LP(a) BMI of 38 Aortic  atherosclerosis on CT 2022 PFO without history of CVA Cough due to ACE inhibitor Obstructive sleep apnea on CPAP CKD stage II  April 2023 consultation: The patient is a 62 y.o. female with the indicated medical history here for recommendations regarding palpitations.  The patient was seen by her primary care provider recently.  She reported an irregular heartbeat.  An EKG was done which demonstrated sinus rhythm with occasional PVCs.  She denies any palpitations.  When she is on a blood pressure monitor she notes an irregular heartbeat.  She denies any shortness of breath.  She occasionally gets chest pain when she walks at work.  She does a good deal of walking.  This does not happen every time she walks.  She has occasionally gotten chest pain sitting down as well.  It does not seem to be related to p.o. intake.  She denies any significant shortness of breath, syncope, severe bleeding, paroxysmal nocturnal dyspnea, orthopnea.  She does have orthostatic symptoms when she goes from sitting to standing quickly.  She recently had an elective procedure to replace the battery on her bladder stimulator which was uncomplicated.  Plan: Monitor, echo, start ASA 81mg  and Jardiance 10mg , increase Lipitor to 40mg , and obtain coronary CTA.   October 2023:  In the interim, she could not get a CTA so a Lexiscan was obtained with low risk findings.  Echocardiogram and monitor were also reassuring with occasional SVT; she was started on Toprol XL.  She has noticed that after starting the Toprol she has been  more fatigued.  She has been plagued with chronic fatigue for quite a while.  She does admit to snoring and has some daytime somnolence.  She had been evaluated for sleep apnea about 10 years ago.  Since that time she has gained about 20 to 30 pounds.  She denies any chest pain, severe dyspnea, presyncope, syncope, or severe edema.  Her palpitations are much improved on Toprol.  She fortunately has not required any  emergency room visits or hospitalizations.  She is otherwise well.  Plan: Start losartan 25 mg daily, check lipid panel LFTs and LP(a); obtain sleep study and decrease Toprol to 12.5 mg due to fatigue.  October 2024: In the interim the patient's LDL was above goal and her atorvastatin was increased to 40 mg.  Repeat level was at goal of 42 in May of this year.  The patient was diagnosed with obstructive sleep apnea and started on CPAP.  In the interim since I saw her last she has lost close to 50 pounds.  She feels very well.  She is less tired after starting CPAP.  She feels like she has much more energy now that she has lost a good amount of weight.  She denies any shortness of breath or chest pain.  She will occasionally get lightheaded when she goes from sitting to standing quickly but has had no frank syncope.  She has required no emergency room visits or hospitalizations.  She is tolerating her medications well without any issues.  Overall she is very pleased with the way she is feeling and how much weight she has lost.        Current Medications: Current Meds  Medication Sig   ALPRAZolam (XANAX) 0.5 MG tablet Take 0.5 mg by mouth at bedtime as needed for anxiety (panic attack).    amphetamine-dextroamphetamine (ADDERALL XR) 15 MG 24 hr capsule Take 15 mg by mouth daily before lunch.   amphetamine-dextroamphetamine (ADDERALL XR) 30 MG 24 hr capsule Take 30 mg by mouth daily at 6 (six) AM.    aspirin EC 81 MG tablet Take 1 tablet (81 mg total) by mouth daily. Swallow whole.   atorvastatin (LIPITOR) 40 MG tablet Take 1 tablet (40 mg total) by mouth daily.   Blood Glucose Monitoring Suppl (ONETOUCH VERIO FLEX SYSTEM) w/Device KIT USE TO CHECK BLOOD SUGAR TWICE A DAY (DX. E11.9)   cariprazine (VRAYLAR) capsule Take 3 mg by mouth every 3 (three) days.    Cholecalciferol (VITAMIN D3) 125 MCG (5000 UT) CAPS Take 5,000 Units by mouth daily.   citalopram (CELEXA) 40 MG tablet Take 40 mg by mouth at  bedtime.    cyanocobalamin (,VITAMIN B-12,) 1000 MCG/ML injection Inject 1 mL (1,000 mcg total) into the muscle every 3 (three) months. every 8 weeks   Cyanocobalamin (VITAMIN B-12) 5000 MCG TBDP Take 5,000 mcg by mouth daily.   desloratadine (CLARINEX) 5 MG tablet Take 1 tablet (5 mg total) by mouth daily.   doxepin (SINEQUAN) 50 MG capsule Take 50 mg by mouth at bedtime.    empagliflozin (JARDIANCE) 25 MG TABS tablet Take 1 tablet (25 mg total) by mouth daily before breakfast.   famotidine (PEPCID) 20 MG tablet    ferrous sulfate 325 (65 FE) MG tablet Take 325 mg by mouth daily with breakfast.   fluticasone (FLONASE) 50 MCG/ACT nasal spray Place 2 sprays into both nostrils daily.   gabapentin (NEURONTIN) 300 MG capsule TAKE 1 CAPSULE TWICE DAILY   glucose blood (ONETOUCH VERIO) test  strip Use to check blood sugar twice daily   Lancets (ONETOUCH DELICA PLUS LANCET33G) MISC USE TO CHECK BLOOD SUGAR TWICE A DAY   levothyroxine (SYNTHROID) 100 MCG tablet TAKE 1 TABLET BY MOUTH DAILY BEFORE BREAKFAST.   loratadine (CLARITIN) 10 MG tablet TAKE 1 TABLET BY MOUTH EVERY DAY   losartan (COZAAR) 25 MG tablet Take 1 tablet (25 mg total) by mouth daily.   metFORMIN (GLUCOPHAGE) 1000 MG tablet TAKE 1/2 TABLET TWICE A DAYWITH MEALS   methylPREDNISolone (MEDROL DOSEPAK) 4 MG TBPK tablet Take with signs of chronic sinusitis and take as directed   metoprolol succinate (TOPROL XL) 25 MG 24 hr tablet Take 0.5 tablets (12.5 mg total) by mouth daily.   pantoprazole (PROTONIX) 40 MG tablet TAKE 1 TABLET EVERY MORNING   tirzepatide (MOUNJARO) 15 MG/0.5ML Pen Inject 15 mg into the skin once a week.   triamcinolone cream (KENALOG) 0.1 % Apply 1 Application topically 2 (two) times daily. To affected area      Review of Systems:   Please see the history of present illness.    All other systems reviewed and are negative.      EKGs/Labs/Other Test Reviewed:   EKG: EKG performed March 2024 demonstrates sinus  rhythm with with trigeminy  EKG Interpretation Date/Time:    Ventricular Rate:    PR Interval:    QRS Duration:    QT Interval:    QTC Calculation:   R Axis:      Text Interpretation:           Risk Assessment/Calculations:          Physical Exam:   VS:  BP 102/78   Pulse 72   Ht 5' 2.5" (1.588 m)   Wt 171 lb 12.8 oz (77.9 kg)   LMP 07/15/2006   SpO2 96%   BMI 30.92 kg/m        Wt Readings from Last 3 Encounters:  11/20/22 171 lb 12.8 oz (77.9 kg)  10/19/22 184 lb (83.5 kg)  10/10/22 181 lb 6 oz (82.3 kg)      GENERAL:  No apparent distress, AOx3 HEENT:  No carotid bruits, +2 carotid impulses, no scleral icterus CAR: RRR no murmurs, gallops, rubs, or thrills RES:  Clear to auscultation bilaterally ABD:  Soft, nontender, nondistended, positive bowel sounds x 4 VASC:  +2 radial pulses, +2 carotid pulses NEURO:  CN 2-12 grossly intact; motor and sensory grossly intact PSYCH:  No active depression or anxiety EXT:  No edema, ecchymosis, or cyanosis  Signed, Orbie Pyo, MD  11/20/2022 4:53 PM    [facility] [address] [place], [place], [place]  [address] Phone: [phone]; Fax: [phone]   Note:  This document was prepared using Dragon voice recognition software and may include unintentional dictation errors.

## 2022-11-20 ENCOUNTER — Ambulatory Visit: Payer: 59 | Attending: Internal Medicine | Admitting: Internal Medicine

## 2022-11-20 ENCOUNTER — Encounter: Payer: Self-pay | Admitting: Internal Medicine

## 2022-11-20 VITALS — BP 102/78 | HR 72 | Ht 62.5 in | Wt 171.8 lb

## 2022-11-20 DIAGNOSIS — I152 Hypertension secondary to endocrine disorders: Secondary | ICD-10-CM

## 2022-11-20 DIAGNOSIS — E1159 Type 2 diabetes mellitus with other circulatory complications: Secondary | ICD-10-CM

## 2022-11-20 DIAGNOSIS — Z6838 Body mass index (BMI) 38.0-38.9, adult: Secondary | ICD-10-CM

## 2022-11-20 DIAGNOSIS — E118 Type 2 diabetes mellitus with unspecified complications: Secondary | ICD-10-CM

## 2022-11-20 DIAGNOSIS — N182 Chronic kidney disease, stage 2 (mild): Secondary | ICD-10-CM

## 2022-11-20 DIAGNOSIS — E1169 Type 2 diabetes mellitus with other specified complication: Secondary | ICD-10-CM | POA: Diagnosis not present

## 2022-11-20 DIAGNOSIS — I700 Atherosclerosis of aorta: Secondary | ICD-10-CM

## 2022-11-20 DIAGNOSIS — G4733 Obstructive sleep apnea (adult) (pediatric): Secondary | ICD-10-CM

## 2022-11-20 DIAGNOSIS — E785 Hyperlipidemia, unspecified: Secondary | ICD-10-CM

## 2022-11-20 NOTE — Patient Instructions (Addendum)
Medication Instructions:  Your physician recommends that you continue on your current medications as directed. Please refer to the Current Medication list given to you today.  *If you need a refill on your cardiac medications before your next appointment, please call your pharmacy*  Lab Work: TODAY: Lipid, LFTs If you have labs (blood work) drawn today and your tests are completely normal, you will receive your results only by: MyChart Message (if you have MyChart) OR A paper copy in the mail If you have any lab test that is abnormal or we need to change your treatment, we will call you to review the results.  Testing/Procedures: None ordered  Follow-Up: At [facility], you and your health needs are our priority.  As part of our continuing mission to provide you with exceptional heart care, we have created designated Provider Care Teams.  These Care Teams include your primary Cardiologist (physician) and Advanced Practice Providers (APPs -  Physician Assistants and Nurse Practitioners) who all work together to provide you with the care you need, when you need it.  We recommend signing up for the patient portal called "MyChart".  Sign up information is provided on this After Visit Summary.  MyChart is used to connect with patients for Virtual Visits (Telemedicine).  Patients are able to view lab/test results, encounter notes, upcoming appointments, etc.  Non-urgent messages can be sent to your provider as well.   To learn more about what you can do with MyChart, go to [email or web address].    Your next appointment:   9 month(s)  The format for your next appointment:   In Person  Provider:   Orbie Pyo, MD {

## 2022-12-04 ENCOUNTER — Other Ambulatory Visit: Payer: Self-pay | Admitting: Medical Genetics

## 2022-12-04 DIAGNOSIS — Z006 Encounter for examination for normal comparison and control in clinical research program: Secondary | ICD-10-CM

## 2022-12-04 NOTE — Progress Notes (Signed)
Additional order as first was a TNP

## 2022-12-13 LAB — HM MAMMOGRAPHY

## 2022-12-25 ENCOUNTER — Other Ambulatory Visit: Payer: Self-pay | Admitting: Family Medicine

## 2022-12-25 NOTE — Telephone Encounter (Signed)
Refill request for  gabapentin (NEURONTIN) 300 MG capsule  LOV - 10/10/22 Next OV - 01/17/23 Last refill - 07/03/22 #180/1

## 2023-01-06 ENCOUNTER — Other Ambulatory Visit: Payer: Self-pay | Admitting: Internal Medicine

## 2023-01-08 ENCOUNTER — Other Ambulatory Visit: Payer: Self-pay | Admitting: Internal Medicine

## 2023-01-08 ENCOUNTER — Other Ambulatory Visit: Payer: Self-pay | Admitting: Family Medicine

## 2023-01-10 ENCOUNTER — Other Ambulatory Visit: Payer: 59

## 2023-01-10 DIAGNOSIS — E039 Hypothyroidism, unspecified: Secondary | ICD-10-CM | POA: Diagnosis not present

## 2023-01-10 DIAGNOSIS — E118 Type 2 diabetes mellitus with unspecified complications: Secondary | ICD-10-CM

## 2023-01-10 DIAGNOSIS — I10 Essential (primary) hypertension: Secondary | ICD-10-CM | POA: Diagnosis not present

## 2023-01-10 DIAGNOSIS — E785 Hyperlipidemia, unspecified: Secondary | ICD-10-CM

## 2023-01-10 DIAGNOSIS — E1169 Type 2 diabetes mellitus with other specified complication: Secondary | ICD-10-CM

## 2023-01-10 LAB — LIPID PANEL
Cholesterol: 114 mg/dL (ref 0–200)
HDL: 35.2 mg/dL — ABNORMAL LOW (ref >39.00)
LDL Cholesterol: 53 mg/dL (ref 0–99)
NonHDL: 78.66
Total CHOL/HDL Ratio: 3
Triglycerides: 127 mg/dL (ref 0.0–149.0)
VLDL: 25.4 mg/dL (ref 0.0–40.0)

## 2023-01-10 LAB — COMPREHENSIVE METABOLIC PANEL
ALT: 18 U/L (ref 0–35)
AST: 18 U/L (ref 0–37)
Albumin: 3.9 g/dL (ref 3.5–5.2)
Alkaline Phosphatase: 75 U/L (ref 39–117)
BUN: 25 mg/dL — ABNORMAL HIGH (ref 6–23)
CO2: 27 meq/L (ref 19–32)
Calcium: 9.3 mg/dL (ref 8.4–10.5)
Chloride: 105 meq/L (ref 96–112)
Creatinine, Ser: 1.21 mg/dL — ABNORMAL HIGH (ref 0.40–1.20)
GFR: 48.86 mL/min — ABNORMAL LOW (ref >60.00)
Glucose, Bld: 103 mg/dL — ABNORMAL HIGH (ref 70–99)
Potassium: 4.5 meq/L (ref 3.5–5.1)
Sodium: 140 meq/L (ref 135–145)
Total Bilirubin: 0.4 mg/dL (ref 0.2–1.2)
Total Protein: 6.2 g/dL (ref 6.0–8.3)

## 2023-01-10 LAB — HEMOGLOBIN A1C: Hgb A1c MFr Bld: 7 % — ABNORMAL HIGH (ref 4.6–6.5)

## 2023-01-10 LAB — TSH: TSH: 1.44 u[IU]/mL (ref 0.35–5.50)

## 2023-01-17 ENCOUNTER — Encounter: Payer: Self-pay | Admitting: Family Medicine

## 2023-01-17 ENCOUNTER — Ambulatory Visit: Payer: 59 | Admitting: Family Medicine

## 2023-01-17 VITALS — BP 98/60 | HR 74 | Temp 98.4°F | Ht 62.5 in | Wt 175.4 lb

## 2023-01-17 DIAGNOSIS — E559 Vitamin D deficiency, unspecified: Secondary | ICD-10-CM

## 2023-01-17 DIAGNOSIS — E538 Deficiency of other specified B group vitamins: Secondary | ICD-10-CM

## 2023-01-17 DIAGNOSIS — Z7984 Long term (current) use of oral hypoglycemic drugs: Secondary | ICD-10-CM | POA: Diagnosis not present

## 2023-01-17 DIAGNOSIS — E785 Hyperlipidemia, unspecified: Secondary | ICD-10-CM

## 2023-01-17 DIAGNOSIS — Z7985 Long-term (current) use of injectable non-insulin antidiabetic drugs: Secondary | ICD-10-CM

## 2023-01-17 DIAGNOSIS — E039 Hypothyroidism, unspecified: Secondary | ICD-10-CM

## 2023-01-17 DIAGNOSIS — M810 Age-related osteoporosis without current pathological fracture: Secondary | ICD-10-CM | POA: Insufficient documentation

## 2023-01-17 DIAGNOSIS — E118 Type 2 diabetes mellitus with unspecified complications: Secondary | ICD-10-CM | POA: Diagnosis not present

## 2023-01-17 DIAGNOSIS — I10 Essential (primary) hypertension: Secondary | ICD-10-CM | POA: Diagnosis not present

## 2023-01-17 DIAGNOSIS — E1169 Type 2 diabetes mellitus with other specified complication: Secondary | ICD-10-CM

## 2023-01-17 HISTORY — DX: Age-related osteoporosis without current pathological fracture: M81.0

## 2023-01-17 NOTE — Assessment & Plan Note (Signed)
Last vitamin D Lab Results  Component Value Date   VD25OH 62.52 07/03/2022   In setting of CKD and osteoporosis  Encouraged to continue current supplementation for bone and overall health

## 2023-01-17 NOTE — Assessment & Plan Note (Signed)
Hypothyroidism  Pt has no clinical changes No change in energy level/ hair or skin/ edema and no tremor Lab Results  Component Value Date   TSH 1.44 01/10/2023    Plan to continue levothyroxine 100 mcg daily

## 2023-01-17 NOTE — Patient Instructions (Addendum)
Make sure you take at least 2000 international units of vitamin D3 every day   Also exercise  Add some strength training to your routine, this is important for bone and brain health and can reduce your risk of falls and help your body use insulin properly and regulate weight  Light weights, exercise bands , and internet videos are a good way to start  Yoga (chair or regular), machines , floor exercises or a gym with machines are also good options   See endocrinology as planned   Keep working on healthy diabetic diet  Try to get most of your carbohydrates from produce (with the exception of white potatoes) and whole grains Eat less bread/pasta/rice/snack foods/cereals/sweets and other items from the middle of the grocery store (processed carbs)   Check glucose at different times of day   Call your insurance co or ask your pharmacist to see if a CGM (continuous glucose monitor) is covered  If it is- let me know what brand and I will send it in   Your blood pressure is borderline low -if you get more light headedness let us know and we may need to cut the metoprolol   Take care of yourself   Follow up in about 3 months

## 2023-01-17 NOTE — Assessment & Plan Note (Signed)
Sent for dexa from gyn Discussed fall prevention, supplements and exercise for bone density    Planning endocrinology visit to discuss treatment options  She does have ckd as well Encouraged her to continue vit D3

## 2023-01-17 NOTE — Assessment & Plan Note (Signed)
Lab Results  Component Value Date   HGBA1C 7.0 (H) 01/10/2023   Suspect this would be lower if she did not have a medrol dose pack in the 3 mo period of time Continues Mounjaro 15 mg weekly  Jardiance 25 mg daily  Metformin 500 mg bid Eating less and eating better Will check into coverage of cgm potentially  Encouraged to continue low glycemic choices and weight loss

## 2023-01-17 NOTE — Assessment & Plan Note (Signed)
BP: 98/60  This is low  Pt admits to occational light headedness with quick position changes If this worsens would consider holding metoprolol but apprehensive to do this since she also has palpitations/sees cardiology as well  Losartan 25 mg daily  Metoprolol xl 12.5 mg daily  Pulse of 74 stable

## 2023-01-17 NOTE — Assessment & Plan Note (Signed)
Earlier today  Hit in front making left turn  No head injury  Some muscle soreness but overall ok  No bruising / reassuring exam  Encouraged use of analgesics/ heat /stretching  Update if not starting to improve in a week or if worsening
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
## 2023-01-17 NOTE — Assessment & Plan Note (Signed)
Disc goals for lipids and reasons to control them Rev last labs with pt Rev low sat fat diet in detail LLD of 53 HDL low - encouraged exercise as tolerated  Plan to continue atorvastatin 40 mg daily

## 2023-01-23 ENCOUNTER — Ambulatory Visit: Payer: 59 | Admitting: "Endocrinology

## 2023-02-01 ENCOUNTER — Other Ambulatory Visit (HOSPITAL_COMMUNITY): Payer: Self-pay | Attending: Medical Genetics

## 2023-02-13 ENCOUNTER — Other Ambulatory Visit: Payer: Self-pay | Admitting: Internal Medicine

## 2023-03-07 ENCOUNTER — Encounter: Payer: Self-pay | Admitting: "Endocrinology

## 2023-03-07 ENCOUNTER — Ambulatory Visit: Payer: 59 | Admitting: "Endocrinology

## 2023-03-07 VITALS — BP 114/60 | HR 84 | Ht 62.0 in | Wt 177.8 lb

## 2023-03-07 DIAGNOSIS — Z8781 Personal history of (healed) traumatic fracture: Secondary | ICD-10-CM

## 2023-03-07 NOTE — Progress Notes (Signed)
[facility] Note Kelli Carbondale, MD    Referring Provider: Harold Hedge, MD Primary Care Provider: Judy Pimple, MD Chief Complaint  Patient presents with   Osteoporosis     Assessment & Plan  Kelli Cruz was seen today for osteoporosis.  Diagnoses and all orders for this visit:  History of compression fracture of spine -     VITAMIN D 25 Hydroxy (Vit-D Deficiency, Fractures) -     Renal function panel     Patient referred for osteoporosis but do not see T-score in osteoporotic range, although the report uses the word osteoporosis but reports the T-score in positive range for the spine.  Also patient reports a history of compression fracture of the L-spine and hands that site may not be accurate for bone density measurements.  Patient has a history of 4 DEXA scans in the past, and all of them looking at the femur show T-score in normal range. BMD results suggest: normal bone density unlike quoted "osteoporosis". Recommend to use calcium 600 mg twice daily and vitamin D 2000 units OTC supplements.  Discussed weight bearing exercise options and dietary supplements. Will evaluate for secondary causes of osteoporosis.  Advised fall precautions, adequate dairy in diet and exercises (aerobic, balancing and weight bearing) as tolerated.   Return in about 1 year (around 03/06/2024) for visit, labs today.  I have reviewed current medications, nurse's notes, allergies, vital signs, past medical and surgical history, family medical history, and social history for this encounter. Counseled patient on symptoms, examination findings, lab findings, imaging results, treatment decisions and monitoring and prognosis. The patient understood the recommendations and agrees with the treatment plan. All questions regarding treatment plan were fully answered.   Kelli Waller, MD   03/07/23    History of Present Illness Kelli Cruz is a 63 y.o. year old female who presents to our  clinic to rule out osteoporosis. She is currently not taking Calcium and vitamin D 5000 international units once daily.  Risk Factors screening:  History of low trauma fractures: No Family history of osteoporosis: No Hip fracture in first-degree relatives: No Smoking history: No Excessive alcohol intake >2 drinks/day: No Excessive caffeine intake >2 drinks/day: Yes Glucocorticoid use >5mg  prednisone/day for >3 months: No Rheumatoid arthritis history: No Premature/Surgical Menopause: Yes, at 47 had hysterectomy and BSO  History of colon cancer at age 45, no chemo/radiation    Anti-epileptic drugs No  Celiac disease/signs of malabsorption No  Gastric bypass/gastrectomy No  PPI use Yes       Physical Exam  BP 114/60   Pulse 84   Ht 5\' 2"  (1.575 m)   Wt 177 lb 12.8 oz (80.6 kg)   LMP 07/15/2006   SpO2 98%   BMI 32.52 kg/m  Constitutional: well developed, well nourished Head: normocephalic, atraumatic Eyes: sclera anicteric, no redness Neck: supple Lungs: normal respiratory effort Neurology: alert and oriented Skin: dry, no appreciable rashes Musculoskeletal: no appreciable defects Psychiatric: normal mood and affect  Allergies Allergies  Allergen Reactions   Ace Inhibitors Cough   Keflex [Cephalexin] Nausea And Vomiting and Other (See Comments)    Light-headed/dizziness/weakness   Mirabegron Other (See Comments)    Trouble breathing and swollen tongue    Current Medications Patient's Medications  New Prescriptions   No medications on file  Previous Medications   ALPRAZOLAM (XANAX) 0.5 MG TABLET    Take 0.5 mg by mouth at bedtime as needed for anxiety (panic attack).    AMPHETAMINE-DEXTROAMPHETAMINE (ADDERALL  XR) 15 MG 24 HR CAPSULE    Take 15 mg by mouth daily before lunch.   AMPHETAMINE-DEXTROAMPHETAMINE (ADDERALL XR) 30 MG 24 HR CAPSULE    Take 30 mg by mouth daily at 6 (six) AM.    ASPIRIN EC 81 MG TABLET    Take 1 tablet (81 mg total) by mouth daily.  Swallow whole.   ATORVASTATIN (LIPITOR) 40 MG TABLET    TAKE 1 TABLET DAILY   BLOOD GLUCOSE MONITORING SUPPL (ONETOUCH VERIO FLEX SYSTEM) W/DEVICE KIT    USE TO CHECK BLOOD SUGAR TWICE A DAY (DX. E11.9)   CARIPRAZINE (VRAYLAR) CAPSULE    Take 3 mg by mouth every 3 (three) days.    CHOLECALCIFEROL (VITAMIN D3) 125 MCG (5000 UT) CAPS    Take 5,000 Units by mouth daily.   CITALOPRAM (CELEXA) 40 MG TABLET    Take 40 mg by mouth at bedtime.    CYANOCOBALAMIN (,VITAMIN B-12,) 1000 MCG/ML INJECTION    Inject 1 mL (1,000 mcg total) into the muscle every 3 (three) months. every 8 weeks   CYANOCOBALAMIN (VITAMIN B-12) 5000 MCG TBDP    Take 5,000 mcg by mouth daily.   DESLORATADINE (CLARINEX) 5 MG TABLET    Take 1 tablet (5 mg total) by mouth daily.   DOXEPIN (SINEQUAN) 50 MG CAPSULE    Take 50 mg by mouth at bedtime.    EMPAGLIFLOZIN (JARDIANCE) 25 MG TABS TABLET    Take 1 tablet (25 mg total) by mouth daily before breakfast.   FAMOTIDINE (PEPCID) 20 MG TABLET       FERROUS SULFATE 325 (65 FE) MG TABLET    Take 325 mg by mouth daily with breakfast.   FLUTICASONE (FLONASE) 50 MCG/ACT NASAL SPRAY    Place 2 sprays into both nostrils daily.   GABAPENTIN (NEURONTIN) 300 MG CAPSULE    TAKE 1 CAPSULE TWICE DAILY   GLUCOSE BLOOD (ONETOUCH VERIO) TEST STRIP    Use to check blood sugar twice daily   LANCETS (ONETOUCH DELICA PLUS LANCET33G) MISC    USE TO CHECK BLOOD SUGAR TWICE A DAY   LEVOTHYROXINE (SYNTHROID) 100 MCG TABLET    TAKE 1 TABLET BY MOUTH DAILY BEFORE BREAKFAST.   LORATADINE (CLARITIN) 10 MG TABLET    TAKE 1 TABLET BY MOUTH EVERY DAY   LOSARTAN (COZAAR) 25 MG TABLET    TAKE 1 TABLET DAILY   METFORMIN (GLUCOPHAGE) 1000 MG TABLET    TAKE 1/2 TABLET TWICE A DAYWITH MEALS   METHYLPREDNISOLONE (MEDROL DOSEPAK) 4 MG TBPK TABLET    Take with signs of chronic sinusitis and take as directed   METOPROLOL SUCCINATE (TOPROL-XL) 25 MG 24 HR TABLET    TAKE 1/2 TABLET DAILY (DOSEDECREASE)   PANTOPRAZOLE (PROTONIX)  40 MG TABLET    TAKE 1 TABLET EVERY MORNING   TIRZEPATIDE (MOUNJARO) 15 MG/0.5ML PEN    Inject 15 mg into the skin once a week.   TRIAMCINOLONE CREAM (KENALOG) 0.1 %    Apply 1 Application topically 2 (two) times daily. To affected area  Modified Medications   No medications on file  Discontinued Medications   No medications on file     Past Medical History Past Medical History:  Diagnosis Date   Allergy 2013   When took medicine for overactive bladder   Anemia    Anxiety    Arthritis    Bipolar 1 disorder (HCC)    Blood transfusion without reported diagnosis 12/2018   Cancer (HCC)  sigmoid colon cancer   Chronic kidney disease    Clotting disorder (HCC) 12/2018   Bleeding ulcer   Depression    Diabetes mellitus    type II   Endometriosis    Family history of malignant neoplasm of gastrointestinal tract    Ganglion cyst    GERD (gastroesophageal reflux disease)    Hyperlipidemia    Hypothyroidism    Insomnia due to mental condition    Irregular heart beat    Myocardial infarction [facility]) 2023   Osteoporosis 01/17/2023   Sleep apnea    Ulcer 12/2018   Bleeding ulcer    Past Surgical History Past Surgical History:  Procedure Laterality Date   ABDOMINAL HYSTERECTOMY     ABDOMINAL HYSTERECTOMY     BIOPSY  12/31/2018   Procedure: BIOPSY;  Surgeon: Sherrilyn Rist, MD;  Location: [facility] END[facility];  Service: Gastroenterology;;   BREAST SURGERY     Had a small lump in left breast checked. Was not cancer   COLON RESECTION  07/14/2008   COLON SURGERY     COLONOSCOPY     DILATION AND CURETTAGE OF UTERUS  02/14/2004   miscarriage    ESOPHAGOGASTRODUODENOSCOPY N/A 12/31/2018   Procedure: ESOPHAGOGASTRODUODENOSCOPY (EGD);  Surgeon: Sherrilyn Rist, MD;  Location: [facility] ENDOSCOPY;  Service: Gastroenterology;  Laterality: N/A;   GANGLION CYST EXCISION     HERNIA REPAIR     INTERSTIM IMPLANT PLACEMENT Left 12/11/2012   stimulator is on the left but the electrodes go to the  right   KNEE DISLOCATION SURGERY     LAPAROSCOPY  02/14/1995   D & C for endometriosis    Family History family history includes Anxiety disorder in her brother, father, and mother; Arthritis in her father and mother; Breast cancer in her maternal aunt, mother, and paternal aunt; Cancer in her maternal aunt and mother; Colon cancer in her father and paternal grandmother; Coronary artery disease in her father and mother; Depression in her brother, brother, and mother; Diabetes in her brother, brother, father, and mother; Early death in her brother; Hearing loss in her father; Heart disease in her mother; Hyperlipidemia in her mother; Kidney disease in her brother and mother; Miscarriages / Stillbirths in her mother; Obesity in her mother.  Social History Social History   Socioeconomic History   Marital status: Married    Spouse name: Not on file   Number of children: 0   Years of education: Not on file   Highest education level: Bachelor's degree (e.g., BA, AB, BS)  Occupational History   Occupation: Teaching laboratory technician    Comment: [employer]   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   Tobacco Use   Smoking status: Never    Passive exposure: Yes   Smokeless tobacco: Never  Vaping Use   Vaping status: Never Used  Substance and Sexual Activity   Alcohol use: Yes    Comment: 1-2 / year   Drug use: No   Sexual activity: Yes    Comment: Had hysterectomy in 2008  Other Topics Concern   Not on file  Social History Narrative   Daily caffeine use   Social Drivers of Health   Financial Resource Strain: Low Risk  (01/13/2023)   Overall Financial Resource Strain (CARDIA)    Difficulty of Paying Living Expenses: Not hard at all  Food Insecurity: No Food Insecurity (01/13/2023)   Hunger Vital Sign    Worried About Running Out of Food in the Last  Year: Never true    Ran Out of Food in the Last Year: Never true  Transportation Needs: No Transportation Needs (01/13/2023)    PRAPARE - Administrator, Civil Service (Medical): No    Lack of Transportation (Non-Medical): No  Physical Activity: Insufficiently Active (01/13/2023)   Exercise Vital Sign    Days of Exercise per Week: 1 day    Minutes of Exercise per Session: 10 min  Stress: Stress Concern Present (01/13/2023)   [employer] of Occupational Health - Occupational Stress Questionnaire    Feeling of Stress : To some extent  Social Connections: Socially Integrated (01/13/2023)   Social Connection and Isolation Panel [NHANES]    Frequency of Communication with Friends and Family: More than three times a week    Frequency of Social Gatherings with Friends and Family: Once a week    Attends Religious Services: 1 to 4 times per year    Active Member of [employer] or Organizations: No    Attends Engineer, structural: More than 4 times per year    Marital Status: Married  Catering manager Violence: Not on file    Laboratory Investigations No components found for: "CMP" No components found for: "BMP" Lab Results  Component Value Date   GFR 48.86 (L) 01/10/2023   Lab Results  Component Value Date   CREATININE 1.21 (H) 01/10/2023   No results found for: "CBC" No components found for: "LFT" No components found for: "VITD" No results found for: "PTH"  Lab Results  Component Value Date   TSH 1.44 01/10/2023   No components found for: "RENAL FUNCTION" No components found for: "MAGNESIUM"  Parts of this note may have been dictated using voice recognition software. There may be variances in spelling and vocabulary which are unintentional. Not all errors are proofread. Please notify the Thereasa Parkin if any discrepancies are noted or if the meaning of any statement is not clear.

## 2023-03-08 LAB — RENAL FUNCTION PANEL
Albumin: 4 g/dL (ref 3.6–5.1)
BUN/Creatinine Ratio: 23 (calc) — ABNORMAL HIGH (ref 6–22)
BUN: 28 mg/dL — ABNORMAL HIGH (ref 7–25)
CO2: 24 mmol/L (ref 20–32)
Calcium: 9.4 mg/dL (ref 8.6–10.4)
Chloride: 104 mmol/L (ref 98–110)
Creat: 1.23 mg/dL — ABNORMAL HIGH (ref 0.50–1.05)
Glucose, Bld: 83 mg/dL (ref 65–99)
Phosphorus: 4.2 mg/dL (ref 2.5–4.5)
Potassium: 4.8 mmol/L (ref 3.5–5.3)
Sodium: 138 mmol/L (ref 135–146)

## 2023-03-08 LAB — VITAMIN D 25 HYDROXY (VIT D DEFICIENCY, FRACTURES): Vit D, 25-Hydroxy: 75 ng/mL (ref 30–100)

## 2023-04-08 ENCOUNTER — Other Ambulatory Visit: Payer: Self-pay | Admitting: Family Medicine

## 2023-04-18 ENCOUNTER — Ambulatory Visit: Payer: 59 | Admitting: Family Medicine

## 2023-04-18 ENCOUNTER — Encounter: Payer: Self-pay | Admitting: Family Medicine

## 2023-04-18 VITALS — BP 118/74 | HR 65 | Temp 97.7°F | Ht 62.0 in | Wt 170.5 lb

## 2023-04-18 DIAGNOSIS — M810 Age-related osteoporosis without current pathological fracture: Secondary | ICD-10-CM

## 2023-04-18 DIAGNOSIS — E039 Hypothyroidism, unspecified: Secondary | ICD-10-CM

## 2023-04-18 DIAGNOSIS — E118 Type 2 diabetes mellitus with unspecified complications: Secondary | ICD-10-CM | POA: Diagnosis not present

## 2023-04-18 DIAGNOSIS — Z7985 Long-term (current) use of injectable non-insulin antidiabetic drugs: Secondary | ICD-10-CM

## 2023-04-18 DIAGNOSIS — E1169 Type 2 diabetes mellitus with other specified complication: Secondary | ICD-10-CM

## 2023-04-18 DIAGNOSIS — N1832 Chronic kidney disease, stage 3b: Secondary | ICD-10-CM

## 2023-04-18 DIAGNOSIS — E785 Hyperlipidemia, unspecified: Secondary | ICD-10-CM

## 2023-04-18 DIAGNOSIS — I10 Essential (primary) hypertension: Secondary | ICD-10-CM

## 2023-04-18 DIAGNOSIS — E119 Type 2 diabetes mellitus without complications: Secondary | ICD-10-CM

## 2023-04-18 LAB — POCT GLYCOSYLATED HEMOGLOBIN (HGB A1C): Hemoglobin A1C: 6.1 % — AB (ref 4.0–5.6)

## 2023-04-18 MED ORDER — ONETOUCH VERIO VI STRP: ORAL_STRIP | 3 refills | Status: AC

## 2023-04-18 NOTE — Assessment & Plan Note (Signed)
 BP: 118/74  This is low  Pt admits to occational light headedness with quick position changes If this worsens would consider holding metoprolol but apprehensive to do this since she also has palpitations/sees cardiology as well  Losartan 25 mg daily  Metoprolol xl 12.5 mg daily (pt has been taking 25- plans to call cardiology to see which dose she should be taking) Pulse of 65 and stable
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
## 2023-04-18 NOTE — Assessment & Plan Note (Addendum)
 Last GFR 48.8 Under care of nephrology

## 2023-04-18 NOTE — Assessment & Plan Note (Signed)
 Pt saw endocrinology  and noted dexa was relatively normal  Has had spinal compression fracture  Discussed fall prevention, supplements and exercise for bone density

## 2023-04-18 NOTE — Assessment & Plan Note (Signed)
 Lab Results  Component Value Date   HGBA1C 6.1 (A) 04/18/2023   HGBA1C 7.0 (H) 01/10/2023   HGBA1C 7.0 (A) 10/10/2022   Improved  Mounjaro 15 mg weekly  Jardiance 25 mg daily  Metformin 500 mg bid  If low glucose levels can consider d/c metformin  Ref for eye exam/interested in new practice Sees nephrology   Commended lifestyle change Encouraged more strength building exercise

## 2023-04-18 NOTE — Patient Instructions (Addendum)
 Send a my chart message to cardiology about the metoprolol and how much you should take   If low glucose readings continue- stop the metformin  Continue to watch  Don't skip meals   Get protein with every meal  The following are examples of protein in diet  Meat  Fish  Eggs  Dairy products  Soy products  Oat milk  Almond milk Nuts and nut butters  Dried beans  Legumes   Make sure to get your eye exam scheduled  I put the referral in  Please let us know if you don't hear in 1-2 weeks    Keep exercising  Add some strength (resistance) training to your routine, this is important for bone and brain health and can reduce your risk of falls and help your body use insulin properly and regulate weight  Light weights, exercise bands , and internet videos are a good way to start  Yoga (chair or regular), machines , floor exercises or a gym with machines are also good options

## 2023-04-18 NOTE — Assessment & Plan Note (Signed)
 Hypothyroidism  Pt has no clinical changes No change in energy level/ hair or skin/ edema and no tremor Lab Results  Component Value Date   TSH 1.44 01/10/2023    Plan to continue levothyroxine 100 mcg daily

## 2023-04-18 NOTE — Assessment & Plan Note (Signed)
 Disc goals for lipids and reasons to control them Rev last labs with pt Rev low sat fat diet in detail LLD of 53 HDL low - encouraged exercise as tolerated  Plan to continue atorvastatin 40 mg daily

## 2023-04-23 ENCOUNTER — Encounter (INDEPENDENT_AMBULATORY_CARE_PROVIDER_SITE_OTHER): Payer: Self-pay | Admitting: Otolaryngology

## 2023-04-23 ENCOUNTER — Encounter: Payer: Self-pay | Admitting: Internal Medicine

## 2023-04-27 ENCOUNTER — Other Ambulatory Visit: Payer: Self-pay | Admitting: Internal Medicine

## 2023-04-27 DIAGNOSIS — E118 Type 2 diabetes mellitus with unspecified complications: Secondary | ICD-10-CM

## 2023-04-27 DIAGNOSIS — E1169 Type 2 diabetes mellitus with other specified complication: Secondary | ICD-10-CM

## 2023-04-30 ENCOUNTER — Other Ambulatory Visit: Payer: Self-pay | Admitting: Family Medicine

## 2023-04-30 ENCOUNTER — Other Ambulatory Visit (INDEPENDENT_AMBULATORY_CARE_PROVIDER_SITE_OTHER): Payer: Self-pay | Admitting: Otolaryngology

## 2023-05-06 ENCOUNTER — Encounter: Payer: Self-pay | Admitting: Family Medicine

## 2023-05-07 ENCOUNTER — Encounter: Payer: Self-pay | Admitting: *Deleted

## 2023-05-21 ENCOUNTER — Encounter: Payer: Self-pay | Admitting: Internal Medicine

## 2023-06-06 ENCOUNTER — Other Ambulatory Visit: Payer: Self-pay | Admitting: Family Medicine

## 2023-06-07 NOTE — Telephone Encounter (Signed)
 Last filled on 12/25/22 #180 caps/ 1 refill  CPE scheduled 07/20/23

## 2023-07-02 ENCOUNTER — Telehealth: Payer: Self-pay | Admitting: Family Medicine

## 2023-07-02 DIAGNOSIS — E559 Vitamin D deficiency, unspecified: Secondary | ICD-10-CM

## 2023-07-02 DIAGNOSIS — I10 Essential (primary) hypertension: Secondary | ICD-10-CM

## 2023-07-02 DIAGNOSIS — N1832 Chronic kidney disease, stage 3b: Secondary | ICD-10-CM

## 2023-07-02 DIAGNOSIS — E1169 Type 2 diabetes mellitus with other specified complication: Secondary | ICD-10-CM

## 2023-07-02 DIAGNOSIS — D500 Iron deficiency anemia secondary to blood loss (chronic): Secondary | ICD-10-CM

## 2023-07-02 DIAGNOSIS — Z7985 Long-term (current) use of injectable non-insulin antidiabetic drugs: Secondary | ICD-10-CM

## 2023-07-02 DIAGNOSIS — M810 Age-related osteoporosis without current pathological fracture: Secondary | ICD-10-CM

## 2023-07-02 DIAGNOSIS — E538 Deficiency of other specified B group vitamins: Secondary | ICD-10-CM

## 2023-07-02 DIAGNOSIS — E039 Hypothyroidism, unspecified: Secondary | ICD-10-CM

## 2023-07-02 NOTE — Telephone Encounter (Signed)
-----   Message from Gerry Krone sent at 06/29/2023  4:07 PM EDT ----- Regarding: Lab orders for, Fri, 5.30.25 Patient is scheduled for CPX labs, please order future labs, Thanks , Anselmo Kings

## 2023-07-03 ENCOUNTER — Encounter: Payer: Self-pay | Admitting: Emergency Medicine

## 2023-07-03 ENCOUNTER — Ambulatory Visit
Admission: EM | Admit: 2023-07-03 | Discharge: 2023-07-03 | Disposition: A | Discharge status: Home / Self Care | Attending: Nurse Practitioner | Admitting: Nurse Practitioner

## 2023-07-03 ENCOUNTER — Other Ambulatory Visit: Payer: Self-pay

## 2023-07-03 DIAGNOSIS — R079 Chest pain, unspecified: Secondary | ICD-10-CM

## 2023-07-03 MED ORDER — ALUM & MAG HYDROXIDE-SIMETH 200-200-20 MG/5ML PO SUSP
30.0000 mL | Freq: Once | ORAL | Status: AC
  Administered 2023-07-03: 30 mL via ORAL

## 2023-07-03 MED ORDER — LIDOCAINE VISCOUS HCL 2 % MT SOLN
15.0000 mL | Freq: Once | OROMUCOSAL | Status: AC
  Administered 2023-07-03: 15 mL via OROMUCOSAL
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
## 2023-07-03 NOTE — Discharge Instructions (Signed)
 Your EKG did not show any abnormalities. Continue your current medications. Increase fluids and allow for plenty of rest. Go to the emergency department immediately if you experience worsening chest pain, shortness of breath, difficulty breathing, nausea, vomiting, or other concerns. If symptoms fail to improve, please follow-up with your primary care physician for further evaluation. Follow-up as needed.

## 2023-07-03 NOTE — ED Triage Notes (Signed)
 Chest pressure x 1 week.  Feel nauseated with headache

## 2023-07-05 ENCOUNTER — Ambulatory Visit: Payer: Self-pay | Admitting: Family Medicine

## 2023-07-05 ENCOUNTER — Encounter: Payer: Self-pay | Admitting: Family Medicine

## 2023-07-05 ENCOUNTER — Ambulatory Visit: Admitting: Family Medicine

## 2023-07-05 VITALS — BP 110/68 | HR 67 | Temp 97.6°F | Ht 62.0 in | Wt 177.4 lb

## 2023-07-05 DIAGNOSIS — N1832 Chronic kidney disease, stage 3b: Secondary | ICD-10-CM

## 2023-07-05 DIAGNOSIS — R0789 Other chest pain: Secondary | ICD-10-CM | POA: Diagnosis not present

## 2023-07-05 DIAGNOSIS — R112 Nausea with vomiting, unspecified: Secondary | ICD-10-CM | POA: Diagnosis not present

## 2023-07-05 DIAGNOSIS — R42 Dizziness and giddiness: Secondary | ICD-10-CM | POA: Insufficient documentation

## 2023-07-05 DIAGNOSIS — Z7985 Long-term (current) use of injectable non-insulin antidiabetic drugs: Secondary | ICD-10-CM

## 2023-07-05 DIAGNOSIS — R519 Headache, unspecified: Secondary | ICD-10-CM | POA: Diagnosis not present

## 2023-07-05 DIAGNOSIS — E119 Type 2 diabetes mellitus without complications: Secondary | ICD-10-CM

## 2023-07-05 LAB — BASIC METABOLIC PANEL WITH GFR
BUN: 35 mg/dL — ABNORMAL HIGH (ref 6–23)
CO2: 30 meq/L (ref 19–32)
Calcium: 9.5 mg/dL (ref 8.4–10.5)
Chloride: 102 meq/L (ref 96–112)
Creatinine, Ser: 1.29 mg/dL — ABNORMAL HIGH (ref 0.40–1.20)
GFR: 45.09 mL/min — ABNORMAL LOW (ref >60.00)
Glucose, Bld: 96 mg/dL (ref 70–99)
Potassium: 4.8 meq/L (ref 3.5–5.1)
Sodium: 136 meq/L (ref 135–145)

## 2023-07-05 LAB — CBC WITH DIFFERENTIAL/PLATELET
Basophils Absolute: 0.1 10*3/uL (ref 0.0–0.1)
Basophils Relative: 1 % (ref 0.0–3.0)
Eosinophils Absolute: 0.1 10*3/uL (ref 0.0–0.7)
Eosinophils Relative: 2.4 % (ref 0.0–5.0)
HCT: 42.1 % (ref 36.0–46.0)
Hemoglobin: 13.9 g/dL (ref 12.0–15.0)
Lymphocytes Relative: 33.1 % (ref 12.0–46.0)
Lymphs Abs: 2.1 10*3/uL (ref 0.7–4.0)
MCHC: 33.1 g/dL (ref 30.0–36.0)
MCV: 93.1 fl (ref 78.0–100.0)
Monocytes Absolute: 0.3 10*3/uL (ref 0.1–1.0)
Monocytes Relative: 5.4 % (ref 3.0–12.0)
Neutro Abs: 3.7 10*3/uL (ref 1.4–7.7)
Neutrophils Relative %: 58.1 % (ref 43.0–77.0)
Platelets: 239 10*3/uL (ref 150.0–400.0)
RBC: 4.52 Mil/uL (ref 3.87–5.11)
RDW: 15.1 % (ref 11.5–15.5)
WBC: 6.3 10*3/uL (ref 4.0–10.5)

## 2023-07-05 MED ORDER — ONDANSETRON HCL 4 MG PO TABS: 4.0000 mg | ORAL_TABLET | Freq: Three times a day (TID) | ORAL | 0 refills | Status: DC | PRN

## 2023-07-05 NOTE — Assessment & Plan Note (Signed)
 Seen in UC Reviewed records and EKG -reassuring Suspect this was from a vomiting episode Overall much improved now  Mild tenderness on exam Can try heat/analgesic prn  Update if not starting to improve in a week or if worsening   Call back and Er precautions noted in detail today

## 2023-07-05 NOTE — Patient Instructions (Addendum)
 Tell your counselor about emotional eating   I wonder if your dizziness/nausea were from a virus  And if the chest discomfort was from vomiting  Headache from dehydration    Drink lots of fluids If nausea returns, try the zofran  and be careful of sedation    Labs today for kidney function and blood count   If symptoms do not continue to improve let us  know or if they worsen

## 2023-07-05 NOTE — Assessment & Plan Note (Signed)
 After n/v Migraine vs dehydration  Also possible viral   Overall improved Normal neuro exam  Encouraged hydration  Lab today

## 2023-07-05 NOTE — Assessment & Plan Note (Signed)
 Episode of spinning/positional dizziness followed by n/v and then headache Improved now Reviewed UC notes  Suspect viral/stress induced or migranous   Reassuring exam May have been viral  Instructed to call if symptoms do not continue to improve  Encouraged hydration for this and headache  Lab today

## 2023-07-05 NOTE — Assessment & Plan Note (Signed)
 Lab today after episode of n/v and headache   Under nephrology care
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
## 2023-07-05 NOTE — Assessment & Plan Note (Signed)
 Now resolved With spinning/dizziness (vertigo) May have been viral   Encouraged to hydrate  Zofran  prescription to keep on hand Lab today

## 2023-07-05 NOTE — Assessment & Plan Note (Signed)
 Ate poorly for stress for a month  This will likely reflect in next A1c  Lab Results  Component Value Date   HGBA1C 6.1 (A) 04/18/2023   HGBA1C 7.0 (H) 01/10/2023   HGBA1C 7.0 (A) 10/10/2022     Encouraged to d/w her psychiatric care team

## 2023-07-06 ENCOUNTER — Ambulatory Visit: Admitting: Family Medicine

## 2023-07-13 ENCOUNTER — Other Ambulatory Visit

## 2023-07-13 DIAGNOSIS — E1169 Type 2 diabetes mellitus with other specified complication: Secondary | ICD-10-CM

## 2023-07-13 DIAGNOSIS — E538 Deficiency of other specified B group vitamins: Secondary | ICD-10-CM | POA: Diagnosis not present

## 2023-07-13 DIAGNOSIS — M810 Age-related osteoporosis without current pathological fracture: Secondary | ICD-10-CM | POA: Diagnosis not present

## 2023-07-13 DIAGNOSIS — E559 Vitamin D deficiency, unspecified: Secondary | ICD-10-CM

## 2023-07-13 DIAGNOSIS — E039 Hypothyroidism, unspecified: Secondary | ICD-10-CM | POA: Diagnosis not present

## 2023-07-13 DIAGNOSIS — I10 Essential (primary) hypertension: Secondary | ICD-10-CM

## 2023-07-13 DIAGNOSIS — E119 Type 2 diabetes mellitus without complications: Secondary | ICD-10-CM | POA: Diagnosis not present

## 2023-07-13 DIAGNOSIS — Z7985 Long-term (current) use of injectable non-insulin antidiabetic drugs: Secondary | ICD-10-CM | POA: Diagnosis not present

## 2023-07-13 DIAGNOSIS — D500 Iron deficiency anemia secondary to blood loss (chronic): Secondary | ICD-10-CM | POA: Diagnosis not present

## 2023-07-13 DIAGNOSIS — E785 Hyperlipidemia, unspecified: Secondary | ICD-10-CM

## 2023-07-13 LAB — COMPREHENSIVE METABOLIC PANEL WITH GFR
ALT: 57 U/L — ABNORMAL HIGH (ref 0–35)
AST: 30 U/L (ref 0–37)
Albumin: 3.9 g/dL (ref 3.5–5.2)
Alkaline Phosphatase: 74 U/L (ref 39–117)
BUN: 35 mg/dL — ABNORMAL HIGH (ref 6–23)
CO2: 26 meq/L (ref 19–32)
Calcium: 9 mg/dL (ref 8.4–10.5)
Chloride: 105 meq/L (ref 96–112)
Creatinine, Ser: 1.26 mg/dL — ABNORMAL HIGH (ref 0.40–1.20)
GFR: 46.38 mL/min — ABNORMAL LOW (ref >60.00)
Glucose, Bld: 106 mg/dL — ABNORMAL HIGH (ref 70–99)
Potassium: 4.6 meq/L (ref 3.5–5.1)
Sodium: 140 meq/L (ref 135–145)
Total Bilirubin: 0.5 mg/dL (ref 0.2–1.2)
Total Protein: 6.1 g/dL (ref 6.0–8.3)

## 2023-07-13 LAB — LIPID PANEL
Cholesterol: 127 mg/dL (ref 0–200)
HDL: 47.2 mg/dL (ref >39.00)
LDL Cholesterol: 60 mg/dL (ref 0–99)
NonHDL: 79.33
Total CHOL/HDL Ratio: 3
Triglycerides: 97 mg/dL (ref 0.0–149.0)
VLDL: 19.4 mg/dL (ref 0.0–40.0)

## 2023-07-13 LAB — TSH: TSH: 6.32 u[IU]/mL — ABNORMAL HIGH (ref 0.35–5.50)

## 2023-07-13 LAB — CBC WITH DIFFERENTIAL/PLATELET
Basophils Absolute: 0 10*3/uL (ref 0.0–0.1)
Basophils Relative: 0.5 % (ref 0.0–3.0)
Eosinophils Absolute: 0.1 10*3/uL (ref 0.0–0.7)
Eosinophils Relative: 2.3 % (ref 0.0–5.0)
HCT: 35.8 % — ABNORMAL LOW (ref 36.0–46.0)
Hemoglobin: 11.6 g/dL — ABNORMAL LOW (ref 12.0–15.0)
Lymphocytes Relative: 35.4 % (ref 12.0–46.0)
Lymphs Abs: 2.3 10*3/uL (ref 0.7–4.0)
MCHC: 32.3 g/dL (ref 30.0–36.0)
MCV: 93 fl (ref 78.0–100.0)
Monocytes Absolute: 0.3 10*3/uL (ref 0.1–1.0)
Monocytes Relative: 4.2 % (ref 3.0–12.0)
Neutro Abs: 3.7 10*3/uL (ref 1.4–7.7)
Neutrophils Relative %: 57.6 % (ref 43.0–77.0)
Platelets: 230 10*3/uL (ref 150.0–400.0)
RBC: 3.85 Mil/uL — ABNORMAL LOW (ref 3.87–5.11)
RDW: 14.8 % (ref 11.5–15.5)
WBC: 6.4 10*3/uL (ref 4.0–10.5)

## 2023-07-13 LAB — HEMOGLOBIN A1C: Hgb A1c MFr Bld: 7 % — ABNORMAL HIGH (ref 4.6–6.5)

## 2023-07-13 LAB — IRON: Iron: 84 ug/dL (ref 42–145)

## 2023-07-13 LAB — FERRITIN: Ferritin: 22.7 ng/mL (ref 10.0–291.0)

## 2023-07-13 LAB — VITAMIN D 25 HYDROXY (VIT D DEFICIENCY, FRACTURES): VITD: 58.57 ng/mL (ref 30.00–100.00)

## 2023-07-13 LAB — VITAMIN B12: Vitamin B-12: 397 pg/mL (ref 211–911)

## 2023-07-15 ENCOUNTER — Ambulatory Visit: Payer: Self-pay | Admitting: Family Medicine

## 2023-07-17 ENCOUNTER — Other Ambulatory Visit (INDEPENDENT_AMBULATORY_CARE_PROVIDER_SITE_OTHER): Payer: Self-pay

## 2023-07-17 DIAGNOSIS — Z7985 Long-term (current) use of injectable non-insulin antidiabetic drugs: Secondary | ICD-10-CM | POA: Diagnosis not present

## 2023-07-17 DIAGNOSIS — E119 Type 2 diabetes mellitus without complications: Secondary | ICD-10-CM

## 2023-07-18 LAB — MICROALBUMIN / CREATININE URINE RATIO
Creatinine,U: 89.8 mg/dL
Microalb Creat Ratio: UNDETERMINED mg/g (ref 0.0–30.0)
Microalb, Ur: <0.7 mg/dL

## 2023-07-20 ENCOUNTER — Encounter: Payer: Self-pay | Admitting: Family Medicine

## 2023-07-20 ENCOUNTER — Ambulatory Visit: Admitting: Family Medicine

## 2023-07-20 VITALS — BP 112/68 | HR 71 | Temp 98.7°F | Ht 62.0 in | Wt 181.4 lb

## 2023-07-20 DIAGNOSIS — E559 Vitamin D deficiency, unspecified: Secondary | ICD-10-CM

## 2023-07-20 DIAGNOSIS — E039 Hypothyroidism, unspecified: Secondary | ICD-10-CM

## 2023-07-20 DIAGNOSIS — Z85038 Personal history of other malignant neoplasm of large intestine: Secondary | ICD-10-CM

## 2023-07-20 DIAGNOSIS — E1169 Type 2 diabetes mellitus with other specified complication: Secondary | ICD-10-CM | POA: Diagnosis not present

## 2023-07-20 DIAGNOSIS — K219 Gastro-esophageal reflux disease without esophagitis: Secondary | ICD-10-CM

## 2023-07-20 DIAGNOSIS — Z0000 Encounter for general adult medical examination without abnormal findings: Secondary | ICD-10-CM | POA: Diagnosis not present

## 2023-07-20 DIAGNOSIS — I10 Essential (primary) hypertension: Secondary | ICD-10-CM

## 2023-07-20 DIAGNOSIS — E785 Hyperlipidemia, unspecified: Secondary | ICD-10-CM

## 2023-07-20 DIAGNOSIS — Z6833 Body mass index (BMI) 33.0-33.9, adult: Secondary | ICD-10-CM

## 2023-07-20 DIAGNOSIS — Z23 Encounter for immunization: Secondary | ICD-10-CM | POA: Diagnosis not present

## 2023-07-20 DIAGNOSIS — I700 Atherosclerosis of aorta: Secondary | ICD-10-CM

## 2023-07-20 DIAGNOSIS — M810 Age-related osteoporosis without current pathological fracture: Secondary | ICD-10-CM

## 2023-07-20 DIAGNOSIS — F418 Other specified anxiety disorders: Secondary | ICD-10-CM

## 2023-07-20 DIAGNOSIS — E538 Deficiency of other specified B group vitamins: Secondary | ICD-10-CM

## 2023-07-20 DIAGNOSIS — E66811 Obesity, class 1: Secondary | ICD-10-CM

## 2023-07-20 DIAGNOSIS — N1832 Chronic kidney disease, stage 3b: Secondary | ICD-10-CM

## 2023-07-20 DIAGNOSIS — E119 Type 2 diabetes mellitus without complications: Secondary | ICD-10-CM

## 2023-07-20 DIAGNOSIS — Z7985 Long-term (current) use of injectable non-insulin antidiabetic drugs: Secondary | ICD-10-CM

## 2023-07-20 DIAGNOSIS — E118 Type 2 diabetes mellitus with unspecified complications: Secondary | ICD-10-CM

## 2023-07-20 DIAGNOSIS — E6609 Other obesity due to excess calories: Secondary | ICD-10-CM

## 2023-07-20 MED ORDER — EMPAGLIFLOZIN 25 MG PO TABS: 25.0000 mg | ORAL_TABLET | Freq: Every day | ORAL | 3 refills | Status: DC

## 2023-07-20 NOTE — Patient Instructions (Addendum)
 Get back to exercise  Add some strength training to your routine, this is important for bone and brain health and can reduce your risk of falls and help your body use insulin  properly and regulate weight  Light weights, exercise bands , and internet videos are a good way to start  Yoga (chair or regular), machines , floor exercises or a gym with machines are also good options    Try going back to jardiance  25 mg daily  If it causes utis or yeast infections let us  know   Stay off metformin  for now    Take your thyroid  medicine (levothyroxine ) first thing in am at least 30 minutes before other medicines or food or vitamins   Get back to regular eating  Avoid excessive tylenol  for liver function    In 3 months- follow up  A1c, liver thyroid  check   Pneumonia shot today

## 2023-07-20 NOTE — Progress Notes (Signed)
 Subjective:    Patient ID: Kelli Cruz, female    DOB: Jan 30, 1961, 63 y.o.   MRN: [id number]  HPI  Here for health maintenance exam and to review chronic medical problems   Wt Readings from Last 3 Encounters:  07/20/23 181 lb 6 oz (82.3 kg)  07/05/23 177 lb 6 oz (80.5 kg)  04/18/23 170 lb 8 oz (77.3 kg)   33.17 kg/m  Vitals:   07/20/23 1602  BP: 112/68  Pulse: 71  Temp: 98.7 F (37.1 C)  SpO2: 99%    Immunization History  Administered Date(s) Administered   Influenza Inj Mdck Quad Pf 12/05/2021   Influenza Split 12/05/2010, 10/24/2011, 11/14/2017   Influenza Whole 02/14/2004, 12/08/2009   Influenza, Seasonal, Injecte, Preservative Fre 12/08/2014, 10/10/2022   Influenza,inj,Quad PF,6+ Mos 10/16/2012, 01/09/2017, 10/05/2018, 11/02/2020   Influenza-Unspecified 10/29/2015, 11/13/2019   Moderna Covid-19 Fall Seasonal Vaccine 31yrs & older 12/05/2021   Moderna Sars-Covid-2 Vaccination 05/12/2019, 06/09/2019, 01/16/2020   PNEUMOCOCCAL CONJUGATE-20 07/20/2023   Pneumococcal Polysaccharide-23 12/05/2010, 09/12/2017   Td 01/03/2005   Tdap 08/04/2015    Health Maintenance Due  Topic Date Due   Colonoscopy  10/09/2023   Shingrix vaccine -not ready for yet- will consider later   Last pneumovax 23 was 2019  Wants to update    Mammogram 11/2022  Self breast exam-no lumps   Gyn health= had hysterectomy in the past  Sees gyn   Colonoscopy 09/2018 with 5 year recall  - aug 29th already has it scheduled  Colon cancer screening  Personal history of colon cancer   Bone health  ? Osteoporosis in past with compression fracture  Last exa was fairly normal (endocrinology)  Dexa 01/2022 from gyn lowest T score -0.9 in [place]- none  Fractures-none  Supplements  Last vitamin D  Lab Results  Component Value Date   VD25OH 58.57 07/13/2023    Exercise  Wants to do more  Has a kettle ball and sit up machine -has not used much  Has a bo flex kind of machine in house  -just fixed it up      Mood    07/20/2023    4:07 PM 07/05/2023   12:06 PM 04/18/2023    3:38 PM 01/17/2023    3:47 PM 10/10/2022    4:02 PM  Depression screen PHQ 2/9  Decreased Interest 0 1 0 0 0  Down, Depressed, Hopeless 1 1 1 1  0  PHQ - 2 Score 1 2 1 1  0  Altered sleeping 1 0 0 0 0  Tired, decreased energy 1 1 1 1 1   Change in appetite 1 2 0 1 1  Feeling bad or failure about yourself  1 0 0 0 0  Trouble concentrating 0 2 0 1 1  Moving slowly or fidgety/restless 0 0 0 0 0  Suicidal thoughts 0 0 0 0 0  PHQ-9 Score 5 7 2 4 3   Difficult doing work/chores Not difficult at all Very difficult Not difficult at all Not difficult at all Not difficult at all   Under psychiatric care Better than last time     HTN bp is stable today  No cp or palpitations or headaches or edema  No side effects to medicines  BP Readings from Last 3 Encounters:  07/20/23 112/68  07/05/23 110/68  07/03/23 121/78     Lab Results  Component Value Date   NA 140 07/13/2023   K 4.6 07/13/2023   CO2 26 07/13/2023   GLUCOSE 106 (  H) 07/13/2023   BUN 35 (H) 07/13/2023   CREATININE 1.26 (H) 07/13/2023   CALCIUM  9.0 07/13/2023   GFR 46.38 (L) 07/13/2023   EGFR 44 (L) 12/02/2021   GFRNONAA 38 (L) 06/03/2019   Losartan  25 mg daily  Metoprolol  xl 12.5 mg daily   Pulse Readings from Last 3 Encounters:  07/20/23 71  07/05/23 67  07/03/23 79   Trying to drink more water    DM2 Lab Results  Component Value Date   HGBA1C 7.0 (H) 07/13/2023   HGBA1C 6.1 (A) 04/18/2023   HGBA1C 7.0 (H) 01/10/2023   Prior to last labs-more stress eating   Moujaro 15 weekly  Jardiance  25 mg - prior -wants to try again  Metformin  -off   Gained weight  Addicted to almonds   Watched also by nephrology   Lab Results  Component Value Date   MICROALBUR <0.7 07/17/2023   MICROALBUR 10.0 (H) 02/03/2015   Hyperlipidemia Lab Results  Component Value Date   CHOL 127 07/13/2023   CHOL 114 01/10/2023   CHOL  113 07/03/2022   Lab Results  Component Value Date   HDL 47.20 07/13/2023   HDL 35.20 (L) 01/10/2023   HDL 37.60 (L) 07/03/2022   Lab Results  Component Value Date   LDLCALC 60 07/13/2023   LDLCALC 53 01/10/2023   LDLCALC 42 07/03/2022   Lab Results  Component Value Date   TRIG 97.0 07/13/2023   TRIG 127.0 01/10/2023   TRIG 166.0 (H) 07/03/2022   Lab Results  Component Value Date   CHOLHDL 3 07/13/2023   CHOLHDL 3 01/10/2023   CHOLHDL 3 07/03/2022   Lab Results  Component Value Date   LDLDIRECT 104.0 05/11/2021   LDLDIRECT 117.0 11/02/2020   LDLDIRECT 99.0 11/13/2019   Atorvastatin  40 mg daily   Hypothyroidism  Pt has no clinical changes No change in energy level/ hair or skin/ edema and no tremor Lab Results  Component Value Date   TSH 6.32 (H) 07/13/2023     Levothyroxine  100 mcg daily -no missed doses but takes all medicine at the same time   GERD Protonix  40 mg daily   B12 def  Lab Results  Component Value Date   VITAMINB12 397 07/13/2023   B12 shot every 6 mo   Mild anemia Lab Results  Component Value Date   WBC 6.4 07/13/2023   HGB 11.6 (L) 07/13/2023   HCT 35.8 (L) 07/13/2023   MCV 93.0 07/13/2023   PLT 230.0 07/13/2023   Lab Results  Component Value Date   IRON 84 07/13/2023   FERRITIN 22.7 07/13/2023    Lab Results  Component Value Date   ALT 57 (H) 07/13/2023   AST 30 07/13/2023   ALKPHOS 74 07/13/2023   BILITOT 0.5 07/13/2023    One liver test up  Was taking some tylenol  Also eating more  No etoh      Patient Active Problem List   Diagnosis Date Noted   GERD (gastroesophageal reflux disease) 07/20/2023   Dizziness 07/05/2023   Headache 07/05/2023   Osteoporosis 01/17/2023   Vision changes 08/28/2022   Aortic atherosclerosis (HCC) 07/03/2022   Frequent UTI 03/23/2022   Chest wall pain 06/27/2021   Chronic cough 06/08/2021   Class 1 obesity due to excess calories with serious comorbidity and body mass index (BMI) of  33.0 to 33.9 in adult 05/11/2021   Irregular heart beat 05/11/2021   History of total abdominal hysterectomy 01/22/2020   At high risk for breast  cancer 01/22/2020   Hypertensive disorder 01/22/2020   Chronic kidney disease, stage 3b (HCC) 02/03/2019   History of GI bleed 12/29/2018   Anemia 02/19/2017   Vitamin D  deficiency 02/19/2017   Proteinuria 02/18/2016   Hypersomnia with sleep apnea 01/06/2015   Insomnia due to mental condition    Burning sensation of mouth 08/12/2014   Routine general medical examination at a health care facility 10/24/2011   B12 deficiency 04/13/2010   History of colon cancer 08/11/2008   Hyperlipidemia associated with type 2 diabetes mellitus (HCC) 07/08/2008   Hypothyroidism 09/12/2006   Diabetes mellitus treated with injections of non-insulin  medication (HCC) 09/12/2006   Depression with anxiety 09/12/2006   ALLERGIC RHINITIS, SEASONAL 09/12/2006   FIBROCYSTIC BREAST DISEASE 09/12/2006   MIGRAINES, HX OF 09/12/2006   Past Medical History:  Diagnosis Date   Allergy 2013   When took medicine for overactive bladder   Anemia    Anxiety    Arthritis    Bipolar 1 disorder (HCC)    Blood transfusion without reported diagnosis 12/2018   Cancer (HCC) 2010   sigmoid colon cancer   Chronic kidney disease    Clotting disorder (HCC) 12/2018   Bleeding ulcer   Depression    Diabetes mellitus    type II   Endometriosis    Family history of malignant neoplasm of gastrointestinal tract    Ganglion cyst    GERD (gastroesophageal reflux disease)    Hyperlipidemia    Bleeding ulcer   Hypothyroidism    Insomnia due to mental condition    Irregular heart beat    Myocardial infarction (HCC) 2023   Nausea & vomiting 12/29/2018   Osteoporosis 01/17/2023   Oxygen deficiency    Sleep apnea    Ulcer 12/2018   Bleeding ulcer   Past Surgical History:  Procedure Laterality Date   ABDOMINAL HYSTERECTOMY     ABDOMINAL HYSTERECTOMY  2008   BIOPSY  12/31/2018    Procedure: BIOPSY;  Surgeon: Albertina Hugger, MD;  Location: [facility] ENDOSCOPY;  Service: Gastroenterology;;   BREAST SURGERY     Had a small lump in left breast checked. Was not cancer   COLON RESECTION  07/14/2008   COLON SURGERY  2010   COLONOSCOPY     DILATION AND CURETTAGE OF UTERUS  02/14/2004   miscarriage    ESOPHAGOGASTRODUODENOSCOPY N/A 12/31/2018   Procedure: ESOPHAGOGASTRODUODENOSCOPY (EGD);  Surgeon: Albertina Hugger, MD;  Location[facility] ENDOSCOPY;  Service: Gastroenterology;  Laterality: N/A;   GANGLION CYST EXCISION     HERNIA REPAIR     Hernia after colon cancer   INTERSTIM IMPLANT PLACEMENT Left 12/11/2012   stimulator is on the left but the electrodes go to the right   KNEE DISLOCATION SURGERY     LAPAROSCOPY  02/14/1995   D & C for endometriosis   Social History   Tobacco Use   Smoking status: Never    Passive exposure: Yes   Smokeless tobacco: Never  Vaping Use   Vaping status: Never Used  Substance Use Topics   Alcohol use: Yes    Comment: 1-2 / year   Drug use: No   Family History  Problem Relation Age of Onset   Breast cancer Mother    Diabetes Mother    Coronary artery disease Mother    Kidney disease Mother        renal insufficiency   Hyperlipidemia Mother    Anxiety disorder Mother    Arthritis Mother  Cancer Mother    Depression Mother    Heart disease Mother    Miscarriages / Stillbirths Mother    Obesity Mother    Vision loss Mother    Diabetes Father    Coronary artery disease Father    Colon cancer Father    Anxiety disorder Father    Arthritis Father    Hearing loss Father    Colon cancer Paternal Grandmother    Breast cancer Maternal Aunt    Breast cancer Paternal Aunt    Anxiety disorder Brother    Depression Brother    Diabetes Brother    Kidney disease Brother    Cancer Maternal Aunt    Depression Brother    Diabetes Brother    Cruz death Brother    Anxiety disorder Brother    Esophageal cancer Neg Hx     Rectal cancer Neg Hx    Stomach cancer Neg Hx    Allergies  Allergen Reactions   Ace Inhibitors Cough   Keflex [Cephalexin] Nausea And Vomiting and Other (See Comments)    Light-headed/dizziness/weakness   Mirabegron Other (See Comments)    Trouble breathing and swollen tongue   Current Outpatient Medications on File Prior to Visit  Medication Sig Dispense Refill   ALPRAZolam  (XANAX ) 0.5 MG tablet Take 0.5 mg by mouth at bedtime as needed for anxiety (panic attack).      amphetamine -dextroamphetamine  (ADDERALL  XR) 15 MG 24 hr capsule Take 15 mg by mouth daily before lunch.     amphetamine -dextroamphetamine  (ADDERALL  XR) 30 MG 24 hr capsule Take 30 mg by mouth daily at 6 (six) AM.      aspirin  EC 81 MG tablet Take 1 tablet (81 mg total) by mouth daily. Swallow whole. 30 tablet 11   atorvastatin  (LIPITOR) 40 MG tablet TAKE 1 TABLET DAILY 90 tablet 3   Blood Glucose Monitoring Suppl (ONETOUCH VERIO FLEX SYSTEM) w/Device KIT USE TO CHECK BLOOD SUGAR TWICE A DAY (DX. E11.9) 1 kit 0   cariprazine  (VRAYLAR ) capsule Take 3 mg by mouth every 3 (three) days.      Cholecalciferol (VITAMIN D3) 125 MCG (5000 UT) CAPS Take 5,000 Units by mouth daily.     citalopram  (CELEXA ) 40 MG tablet Take 40 mg by mouth at bedtime.      cyanocobalamin  (,VITAMIN B-12,) 1000 MCG/ML injection Inject 1 mL (1,000 mcg total) into the muscle every 3 (three) months. every 8 weeks 1 mL 3   Cyanocobalamin  (VITAMIN B-12) 5000 MCG TBDP Take 5,000 mcg by mouth daily.     doxepin  (SINEQUAN ) 50 MG capsule Take 50 mg by mouth at bedtime.   1   ferrous sulfate 325 (65 FE) MG tablet Take 325 mg by mouth daily with breakfast.     fluticasone  (FLONASE ) 50 MCG/ACT nasal spray SPRAY 2 SPRAYS INTO EACH NOSTRIL EVERY DAY 48 mL 2   glucose blood (ONETOUCH VERIO) test strip Use to check blood sugar twice daily 200 strip 3   Lancets (ONETOUCH DELICA PLUS LANCET33G) MISC USE TO CHECK BLOOD SUGAR TWICE A DAY 200 each 1   levothyroxine   (SYNTHROID ) 100 MCG tablet TAKE 1 TABLET BY MOUTH EVERY DAY BEFORE BREAKFAST 90 tablet 1   loratadine  (CLARITIN ) 10 MG tablet TAKE 1 TABLET BY MOUTH EVERY DAY 90 tablet 1   losartan  (COZAAR ) 25 MG tablet TAKE 1 TABLET DAILY 90 tablet 3   metoprolol  succinate (TOPROL -XL) 25 MG 24 hr tablet TAKE 1/2 TABLET DAILY (DOSEDECREASE) 45 tablet 3   ondansetron  (  ZOFRAN ) 4 MG tablet Take 1 tablet (4 mg total) by mouth every 8 (eight) hours as needed for nausea or vomiting. Caution of sedatoin 20 tablet 0   pantoprazole  (PROTONIX ) 40 MG tablet TAKE 1 TABLET EVERY MORNING 90 tablet 0   tirzepatide  (MOUNJARO ) 15 MG/0.5ML Pen INJECT 15 MG INTO THE SKIN ONCE A WEEK. 2 mL 5   No current facility-administered medications on file prior to visit.    Review of Systems  Constitutional:  Negative for activity change, appetite change, fatigue, fever and unexpected weight change.  HENT:  Negative for congestion, ear pain, rhinorrhea, sinus pressure and sore throat.   Eyes:  Negative for pain, redness and visual disturbance.  Respiratory:  Negative for cough, shortness of breath and wheezing.   Cardiovascular:  Negative for chest pain and palpitations.  Gastrointestinal:  Negative for abdominal pain, blood in stool, constipation and diarrhea.  Endocrine: Negative for polydipsia and polyuria.  Genitourinary:  Negative for dysuria, frequency and urgency.  Musculoskeletal:  Positive for arthralgias. Negative for back pain and myalgias.  Skin:  Negative for pallor and rash.  Allergic/Immunologic: Negative for environmental allergies.  Neurological:  Negative for dizziness, syncope and headaches.  Hematological:  Negative for adenopathy. Does not bruise/bleed easily.  Psychiatric/Behavioral:  Positive for sleep disturbance. Negative for decreased concentration, dysphoric mood and suicidal ideas. The patient is nervous/anxious.        Mood is improved       Objective:   Physical Exam Constitutional:      General:  She is not in acute distress.    Appearance: Normal appearance. She is well-developed. She is obese. She is not ill-appearing or diaphoretic.  HENT:     Head: Normocephalic and atraumatic.     Right Ear: Tympanic membrane, ear canal and external ear normal.     Left Ear: Tympanic membrane, ear canal and external ear normal.     Nose: Nose normal. No congestion.     Mouth/Throat:     Mouth: Mucous membranes are moist.     Pharynx: Oropharynx is clear. No posterior oropharyngeal erythema.  Eyes:     General: No scleral icterus.    Extraocular Movements: Extraocular movements intact.     Conjunctiva/sclera: Conjunctivae normal.     Pupils: Pupils are equal, round, and reactive to light.  Neck:     Thyroid : No thyromegaly.     Vascular: No carotid bruit or JVD.  Cardiovascular:     Rate and Rhythm: Normal rate and regular rhythm.     Pulses: Normal pulses.     Heart sounds: Normal heart sounds.     No gallop.  Pulmonary:     Effort: Pulmonary effort is normal. No respiratory distress.     Breath sounds: Normal breath sounds. No wheezing.     Comments: Good air exch Chest:     Chest wall: No tenderness.  Abdominal:     General: Bowel sounds are normal. There is no distension or abdominal bruit.     Palpations: Abdomen is soft. There is no mass.     Tenderness: There is no abdominal tenderness.     Hernia: No hernia is present.  Genitourinary:    Comments: Breast and pelvic exam are done by gyn provider   Musculoskeletal:        General: No tenderness. Normal range of motion.     Cervical back: Normal range of motion and neck supple. No rigidity. No muscular tenderness.     Right lower  leg: No edema.     Left lower leg: No edema.     Comments: No kyphosis   Lymphadenopathy:     Cervical: No cervical adenopathy.  Skin:    General: Skin is warm and dry.     Coloration: Skin is not pale.     Findings: No erythema or rash.     Comments: Solar lentigines diffusely Fair    Neurological:     Mental Status: She is alert. Mental status is at baseline.     Cranial Nerves: No cranial nerve deficit.     Motor: No abnormal muscle tone.     Coordination: Coordination normal.     Gait: Gait normal.     Deep Tendon Reflexes: Reflexes are normal and symmetric. Reflexes normal.  Psychiatric:        Mood and Affect: Mood normal.        Cognition and Memory: Cognition and memory normal.           Assessment & Plan:   Problem List Items Addressed This Visit       Cardiovascular and Mediastinum   Hypertensive disorder   bp in fair control at this time  BP Readings from Last 1 Encounters:  07/20/23 112/68   No changes needed Most recent labs reviewed  Disc lifstyle change with low sodium diet and exercise  Continues Losartan  25 mg daily  Metoprolol  xl 12.5 mg daily        Aortic atherosclerosis (HCC)   No symptoms Continue to work on good HTN and lipid control         Digestive   GERD (gastroesophageal reflux disease)   Protonix  40 mg daily  Unable to get off Watching vitamin levels          Endocrine   Hypothyroidism   Hypothyroidism  Pt has no clinical changes No change in energy level/ hair or skin/ edema and no tremor Lab Results  Component Value Date   TSH 6.32 (H) 07/13/2023    This is up - pt noted she takes all her medicine at the same time Encouraged to take levothyroxine  100 mcg daily in am at least 30 min separated from food/med/supplements  Will re check at 3 mo f/u       Hyperlipidemia associated with type 2 diabetes mellitus (HCC)   Disc goals for lipids and reasons to control them Rev last labs with pt Rev low sat fat diet in detail LDL of 60 HDL low but improved - encouraged exercise as tolerated  Plan to continue atorvastatin  40 mg daily        Relevant Medications   empagliflozin  (JARDIANCE ) 25 MG TABS tablet   Diabetes mellitus treated with injections of non-insulin  medication (HCC)   Lab Results   Component Value Date   HGBA1C 7.0 (H) 07/13/2023   HGBA1C 6.1 (A) 04/18/2023   HGBA1C 7.0 (H) 01/10/2023   Continues Mounjaro  15 mg weekly  Wants to try jardiance  25 mg daily again  A1c went up after emotional eating, that has improved  Microalb utd  Taking ace and statin   Follow up 3 mo      Relevant Medications   empagliflozin  (JARDIANCE ) 25 MG TABS tablet     Musculoskeletal and Integument   Osteoporosis   History of comp fracture but had normal dexa 01/2022  No falls or new fracture  D level normal  Discussed fall prevention, supplements and exercise for bone density  Genitourinary   Chronic kidney disease, stage 3b (HCC)   GFR 46.38 Sees nephrology  Wants to try jardiance  again  Good blood pressure control         Other   Vitamin D  deficiency   Last vitamin D  Lab Results  Component Value Date   VD25OH 58.57 07/13/2023   Vitamin D  level is therapeutic with current supplementation Disc importance of this to bone and overall health       Routine general medical examination at a health care facility - Primary   Reviewed health habits including diet and exercise and skin cancer prevention Reviewed appropriate screening tests for age  Also reviewed health mt list, fam hx and immunization status , as well as social and family history   See HPI Labs reviewed and ordered Health Maintenance  Topic Date Due   Colon Cancer Screening  10/09/2023   Zoster (Shingles) Vaccine (1 of 2) 10/20/2023*   COVID-19 Vaccine (5 - 2024-25 season) 05/03/2024*   Eye exam for diabetics  08/08/2023   Flu Shot  09/14/2023   Mammogram  12/13/2023   Hemoglobin A1C  01/13/2024   Complete foot exam   04/17/2024   Yearly kidney function blood test for diabetes  07/12/2024   Yearly kidney health urinalysis for diabetes  07/16/2024   DTaP/Tdap/Td vaccine (3 - Td or Tdap) 08/03/2025   Pneumococcal Vaccination  Completed   Hepatitis C Screening  Completed   HIV Screening   Completed   HPV Vaccine  Aged Out   Meningitis B Vaccine  Aged Out  *Topic was postponed. The date shown is not the original due date.    Declines shingrix/may consider later  Pna 20 updated today  Sees gyn  Colonoscopy scheduled for August  Dexa utd  Discussed fall prevention, supplements and exercise for bone density  PHQ 7 under psychiatric care       History of colon cancer   Colonoscopy due for 5 y recall in aug-already scheduled In setting of personal history of colon cancer        Depression with anxiety   PHQ 7  Under psychiatric care Improved since last visit       Class 1 obesity due to excess calories with serious comorbidity and body mass index (BMI) of 33.0 to 33.9 in adult   Continues mounjaro   Eating better  Discussed plan for exercise       Relevant Medications   empagliflozin  (JARDIANCE ) 25 MG TABS tablet   B12 deficiency   Lab Results  Component Value Date   VITAMINB12 397 07/13/2023   Continues shot every 6 months       Other Visit Diagnoses       Controlled type 2 diabetes mellitus with complication, without long-term current use of insulin  (HCC)       Relevant Medications   empagliflozin  (JARDIANCE ) 25 MG TABS tablet     Need for pneumococcal 20-valent conjugate vaccination       Relevant Orders   Pneumococcal conjugate vaccine 20-valent (Prevnar 20) (Completed)

## 2023-07-22 NOTE — Assessment & Plan Note (Signed)
 History of comp fracture but had normal dexa 01/2022  No falls or new fracture  D level normal  Discussed fall prevention, supplements and exercise for bone density

## 2023-07-22 NOTE — Assessment & Plan Note (Signed)
 Lab Results  Component Value Date   VITAMINB12 397 07/13/2023   Continues shot every 6 months

## 2023-07-22 NOTE — Assessment & Plan Note (Signed)
 Hypothyroidism  Pt has no clinical changes No change in energy level/ hair or skin/ edema and no tremor Lab Results  Component Value Date   TSH 6.32 (H) 07/13/2023    This is up - pt noted she takes all her medicine at the same time Encouraged to take levothyroxine  100 mcg daily in am at least 30 min separated from food/med/supplements  Will re check at 3 mo f/u

## 2023-07-22 NOTE — Assessment & Plan Note (Signed)
 Continues mounjaro   Eating better  Discussed plan for exercise

## 2023-07-22 NOTE — Assessment & Plan Note (Signed)
 PHQ 7  Under psychiatric care Improved since last visit

## 2023-07-22 NOTE — Assessment & Plan Note (Signed)
 Reviewed health habits including diet and exercise and skin cancer prevention Reviewed appropriate screening tests for age  Also reviewed health mt list, fam hx and immunization status , as well as social and family history   See HPI Labs reviewed and ordered Health Maintenance  Topic Date Due   Colon Cancer Screening  10/09/2023   Zoster (Shingles) Vaccine (1 of 2) 10/20/2023*   COVID-19 Vaccine (5 - 2024-25 season) 05/03/2024*   Eye exam for diabetics  08/08/2023   Flu Shot  09/14/2023   Mammogram  12/13/2023   Hemoglobin A1C  01/13/2024   Complete foot exam   04/17/2024   Yearly kidney function blood test for diabetes  07/12/2024   Yearly kidney health urinalysis for diabetes  07/16/2024   DTaP/Tdap/Td vaccine (3 - Td or Tdap) 08/03/2025   Pneumococcal Vaccination  Completed   Hepatitis C Screening  Completed   HIV Screening  Completed   HPV Vaccine  Aged Out   Meningitis B Vaccine  Aged Out  *Topic was postponed. The date shown is not the original due date.    Declines shingrix/may consider later  Pna 20 updated today  Sees gyn  Colonoscopy scheduled for August  Dexa utd  Discussed fall prevention, supplements and exercise for bone density  PHQ 7 under psychiatric care

## 2023-07-22 NOTE — Assessment & Plan Note (Signed)
No symptoms Continue to work on good HTN and lipid control

## 2023-07-22 NOTE — Assessment & Plan Note (Signed)
 bp in fair control at this time  BP Readings from Last 1 Encounters:  07/20/23 112/68   No changes needed Most recent labs reviewed  Disc lifstyle change with low sodium diet and exercise  Continues Losartan  25 mg daily  Metoprolol  xl 12.5 mg daily

## 2023-07-22 NOTE — Assessment & Plan Note (Signed)
 GFR 46.38 Sees nephrology  Wants to try jardiance  again  Good blood pressure control

## 2023-07-22 NOTE — Assessment & Plan Note (Signed)
 Lab Results  Component Value Date   HGBA1C 7.0 (H) 07/13/2023   HGBA1C 6.1 (A) 04/18/2023   HGBA1C 7.0 (H) 01/10/2023   Continues Mounjaro  15 mg weekly  Wants to try jardiance  25 mg daily again  A1c went up after emotional eating, that has improved  Microalb utd  Taking ace and statin   Follow up 3 mo

## 2023-07-22 NOTE — Assessment & Plan Note (Addendum)
 Disc goals for lipids and reasons to control them Rev last labs with pt Rev low sat fat diet in detail LDL of 60 HDL low but improved - encouraged exercise as tolerated  Plan to continue atorvastatin  40 mg daily

## 2023-07-22 NOTE — Assessment & Plan Note (Signed)
 Protonix  40 mg daily  Unable to get off Watching vitamin levels

## 2023-07-22 NOTE — Assessment & Plan Note (Signed)
 Last vitamin D  Lab Results  Component Value Date   VD25OH 58.57 07/13/2023   Vitamin D  level is therapeutic with current supplementation Disc importance of this to bone and overall health

## 2023-07-22 NOTE — Assessment & Plan Note (Signed)
 Colonoscopy due for 5 y recall in aug-already scheduled In setting of personal history of colon cancer

## 2023-07-26 ENCOUNTER — Encounter: Payer: Self-pay | Admitting: Internal Medicine

## 2023-08-08 ENCOUNTER — Other Ambulatory Visit (HOSPITAL_COMMUNITY)

## 2023-08-11 ENCOUNTER — Other Ambulatory Visit
Admission: RE | Admit: 2023-08-11 | Discharge: 2023-08-11 | Disposition: A | Discharge status: Home / Self Care | Payer: Self-pay | Source: Ambulatory Visit | Attending: Medical Genetics | Admitting: Medical Genetics

## 2023-08-11 DIAGNOSIS — Z006 Encounter for examination for normal comparison and control in clinical research program: Secondary | ICD-10-CM | POA: Insufficient documentation

## 2023-08-28 LAB — GENECONNECT MOLECULAR SCREEN: Genetic Analysis Overall Interpretation: NEGATIVE

## 2023-09-10 ENCOUNTER — Encounter: Payer: Self-pay | Admitting: Cardiology

## 2023-09-10 ENCOUNTER — Ambulatory Visit: Attending: Cardiology | Admitting: Cardiology

## 2023-09-10 ENCOUNTER — Telehealth: Payer: Self-pay | Admitting: *Deleted

## 2023-09-10 VITALS — BP 100/60 | HR 71 | Ht 62.0 in | Wt 177.0 lb

## 2023-09-10 DIAGNOSIS — I152 Hypertension secondary to endocrine disorders: Secondary | ICD-10-CM

## 2023-09-10 DIAGNOSIS — E1159 Type 2 diabetes mellitus with other circulatory complications: Secondary | ICD-10-CM

## 2023-09-10 DIAGNOSIS — I700 Atherosclerosis of aorta: Secondary | ICD-10-CM

## 2023-09-10 DIAGNOSIS — Q2112 Patent foramen ovale: Secondary | ICD-10-CM

## 2023-09-10 DIAGNOSIS — N182 Chronic kidney disease, stage 2 (mild): Secondary | ICD-10-CM

## 2023-09-10 DIAGNOSIS — R002 Palpitations: Secondary | ICD-10-CM

## 2023-09-10 DIAGNOSIS — E785 Hyperlipidemia, unspecified: Secondary | ICD-10-CM

## 2023-09-10 DIAGNOSIS — E1169 Type 2 diabetes mellitus with other specified complication: Secondary | ICD-10-CM

## 2023-09-10 DIAGNOSIS — G4733 Obstructive sleep apnea (adult) (pediatric): Secondary | ICD-10-CM

## 2023-09-10 DIAGNOSIS — E118 Type 2 diabetes mellitus with unspecified complications: Secondary | ICD-10-CM | POA: Diagnosis not present

## 2023-09-10 NOTE — Patient Instructions (Signed)
 Medication Instructions:  NO CHANGES *If you need a refill on your cardiac medications before your next appointment, please call your pharmacy*  Lab Work: NO LABS If you have labs (blood work) drawn today and your tests are completely normal, you will receive your results only by: MyChart Message (if you have MyChart) OR A paper copy in the mail If you have any lab test that is abnormal or we need to change your treatment, we will call you to review the results.  Testing/Procedures: NO TESTING  Follow-Up: At [facility], you and your health needs are our priority.  As part of our continuing mission to provide you with exceptional heart care, our providers are all part of one team.  This team includes your primary Cardiologist (physician) and Advanced Practice Providers or APPs (Physician Assistants and Nurse Practitioners) who all work together to provide you with the care you need, when you need it.  Your next appointment:   1 year(s)  Provider:   Arun K Thukkani, MD

## 2023-09-10 NOTE — Progress Notes (Signed)
 Cardiology Office Note:   Date:  09/10/2023  ID:  Lucresia Simic Hirsch, DOB 12/11/1960, MRN [id number] PCP: Randeen Laine LABOR, MD  [facility] Providers Cardiologist:  Arun K Thukkani, MD    History of Present Illness:   Discussed the use of AI scribe software for clinical note transcription with the patient, who gave verbal consent to proceed.  History of Present Illness Kelli Cruz is a 63 year old female with palpitations who presents for a follow-up.   She has a history of palpitations, initially evaluated with an echo that found preserved ejection fraction and a patent foramen ovale. A cardiac monitor in 2023 revealed frequent premature ventricular contractions, leading to the initiation of Toprol  XL 25 mg daily. The dose was later reduced to 12.5 mg after she experienced dizziness and lightheadedness. She reports that she did not notice a big change in her palpitations after the dose was reduced. Palpitations are more noticeable during periods of stress but are otherwise not a major concern.  In May 2025, she visited urgent care for chest pain, where a GI cocktail was administered without symptom relief. She was advised to go to the emergency department but instead followed up with her primary care physician, who attributed the symptoms to a viral illness. She experiences chest pressure primarily during stress, which subsides when stress levels decrease. No chest pressure during physical exertion.  She has type two diabetes and is currently on Mounjaro  and Cozaar . Jardiance  was discontinued approximately four to five months ago, and she is awaiting further evaluation by her primary care physician to potentially restart it. Her last A1c was 7.0%.  She reports stress eating due to family issues and frequent travel, which has impacted her weight. Her weight increased to 180 lbs but has since decreased to 175 lbs. She acknowledges the need for regular physical activity and is considering  participating in water aerobics.  She has obstructive sleep apnea and uses a CPAP machine periodically, though she experiences difficulties with it.  She is on atorvastatin  40 mg daily for hyperlipidemia, with recent cholesterol levels showing a total cholesterol of 127 mg/dL and LDL of 60 mg/dL. She continues to take aspirin  81 mg daily due to aortic atherosclerosis and PFO.   Studies Reviewed:    EKG:          Risk Assessment/Calculations:              Physical Exam:   VS:  BP 100/60   Pulse 71   Ht 5' 2 (1.575 m)   Wt 177 lb (80.3 kg)   LMP 07/15/2006   SpO2 96%   BMI 32.37 kg/m    Wt Readings from Last 3 Encounters:  09/10/23 177 lb (80.3 kg)  07/20/23 181 lb 6 oz (82.3 kg)  07/05/23 177 lb 6 oz (80.5 kg)     Physical Exam Vitals reviewed.  Constitutional:      Appearance: Normal appearance.  HENT:     Head: Normocephalic.     Nose: Nose normal.  Eyes:     Pupils: Pupils are equal, round, and reactive to light.  Cardiovascular:     Rate and Rhythm: Normal rate and regular rhythm.     Pulses: Normal pulses.     Heart sounds: Normal heart sounds. No murmur heard.    No friction rub. No gallop.  Pulmonary:     Effort: Pulmonary effort is normal.     Breath sounds: Normal breath sounds.  Abdominal:  General: Abdomen is flat.  Musculoskeletal:     Right lower leg: No edema.     Left lower leg: No edema.  Skin:    General: Skin is warm and dry.     Capillary Refill: Capillary refill takes less than 2 seconds.  Neurological:     General: No focal deficit present.     Mental Status: She is alert and oriented to person, place, and time.  Psychiatric:        Mood and Affect: Mood normal.        Behavior: Behavior normal.        Thought Content: Thought content normal.        Judgment: Judgment normal.     ASSESSMENT AND PLAN:    Assessment & Plan Palpitations with PVCs Palpitations with PVCs, more noticeable during stress. Symptoms are well  controlled with metoprolol  succinate 12.5 mg. PVCs are not harmful at current frequency but can cause discomfort. - Continue metoprolol  succinate 12.5 mg daily. - She can increase to 25 mg if palpitations worsen. - Monitor symptoms and adjust dose as needed.  Aortic atherosclerosis Aortic atherosclerosis with good control of risk factors. - Continue aspirin  81 mg daily. - Monitor cardiovascular risk factors.  PFO Noted on 2023 echocardiogram.  - Continue ASA 81mg .  Hypertension Hypertension well controlled with current medication regimen. Recent blood pressure readings are lower than usual but within acceptable range. - Continue current antihypertensive regimen.  Chronic kidney disease, stage 2 Chronic kidney disease, stage 2. Blood pressure well controlled with Cozaar , which also provides renal protection. - Continue Cozaar  per PCP  Hyperlipidemia Hyperlipidemia well controlled with atorvastatin . Recent lipid panel shows total cholesterol of 127 and LDL of 60. - Continue atorvastatin  40 mg daily. - Monitor lipid levels regularly.  Type 2 diabetes mellitus Type 2 diabetes mellitus with recent A1c of 7.0. Weight has increased due to stress eating but is now decreasing. - Continue Mounjaro . - Discuss with PCP about resuming Jardiance .  Obstructive sleep apnea Obstructive sleep apnea with intermittent CPAP use. Weight loss may improve severity. Untreated sleep apnea can negatively affect cardiac health. - Encourage regular use of CPAP.           Signed, Artist Pouch, PA-C

## 2023-09-10 NOTE — Telephone Encounter (Signed)
 Charmaine,  This pt is scheduled with Dr. Legrand on 8/29 and has a PFO.  Thanks,  Norleen EMERSON Schillings

## 2023-09-17 ENCOUNTER — Ambulatory Visit (AMBULATORY_SURGERY_CENTER)

## 2023-09-17 VITALS — Ht 62.0 in | Wt 175.0 lb

## 2023-09-17 DIAGNOSIS — Z85038 Personal history of other malignant neoplasm of large intestine: Secondary | ICD-10-CM

## 2023-09-17 MED ORDER — NA SULFATE-K SULFATE-MG SULF 17.5-3.13-1.6 GM/177ML PO SOLN: 1.0000 | Freq: Once | ORAL | 0 refills | Status: AC

## 2023-09-17 NOTE — Patient Instructions (Signed)
 ORAL DIABETIC MEDICATION INSTRUCTIONS Metformin  Glipizide  Jardiance  Glimepiride Farixga Januvia  Rybelsus , Xigduo, Sitagliptin, Synjardy Tradjenta Actos, Alogliptin Invokana  The day before your procedure: 8/28  Do not take your diabetic pill   The day of your procedure: 8/29  Do not take your diabetic pill   We will check your blood sugar levels during the admission process and again in Recovery before discharging you home  _____________________________________________________________________________   ONCE A WEEK INJECTIONS Ozempic ,  Mounjaro , Wegovy , Trulicity, Tanzeum, Byetta, Victoza, Bydureon, & SymlinPen  -DO NOT TAKE 7 days prior to the procedure.  Last dose on or before Thursday 8/21 failure to hold this medication will result in a cancellation or rescheduling of your procedure

## 2023-09-17 NOTE — Progress Notes (Signed)
 No egg or soy allergy known to patient  PONV - with past sedation with any surgeries or procedures Patient denies ever being told they had issues or difficulty with intubation  No FH of Malignant Hyperthermia Pt is not on diet pills Pt is not on  home 02  Pt is not on blood thinners  Pt denies issues with constipation  No A fib or A flutter Have any cardiac testing pending-- no  LOA: independent  Prep: suprep   Patient's chart reviewed by Rogena Class CNRA prior to previsit and patient appropriate for the [facility].  Previsit completed and red dot placed by patient's name on their procedure day (on provider's schedule).     PV completed with patient. Prep instructions sent via mychart and home address.

## 2023-10-12 ENCOUNTER — Encounter: Admitting: Gastroenterology

## 2023-10-15 ENCOUNTER — Other Ambulatory Visit: Payer: Self-pay | Admitting: Internal Medicine

## 2023-10-15 DIAGNOSIS — E118 Type 2 diabetes mellitus with unspecified complications: Secondary | ICD-10-CM

## 2023-10-15 DIAGNOSIS — E1169 Type 2 diabetes mellitus with other specified complication: Secondary | ICD-10-CM

## 2023-10-22 ENCOUNTER — Encounter: Payer: Self-pay | Admitting: Gastroenterology

## 2023-10-22 ENCOUNTER — Ambulatory Visit: Admitting: Gastroenterology

## 2023-10-22 VITALS — BP 133/60 | HR 61 | Temp 97.3°F | Resp 11 | Ht 62.0 in | Wt 175.0 lb

## 2023-10-22 DIAGNOSIS — Z800 Family history of malignant neoplasm of digestive organs: Secondary | ICD-10-CM

## 2023-10-22 DIAGNOSIS — K635 Polyp of colon: Secondary | ICD-10-CM

## 2023-10-22 DIAGNOSIS — Z980 Intestinal bypass and anastomosis status: Secondary | ICD-10-CM

## 2023-10-22 DIAGNOSIS — Z1211 Encounter for screening for malignant neoplasm of colon: Secondary | ICD-10-CM

## 2023-10-22 DIAGNOSIS — D122 Benign neoplasm of ascending colon: Secondary | ICD-10-CM

## 2023-10-22 DIAGNOSIS — Z85038 Personal history of other malignant neoplasm of large intestine: Secondary | ICD-10-CM

## 2023-10-22 DIAGNOSIS — D124 Benign neoplasm of descending colon: Secondary | ICD-10-CM

## 2023-10-22 MED ORDER — SODIUM CHLORIDE 0.9 % IV SOLN: 500.0000 mL | Freq: Once | INTRAVENOUS | Status: DC

## 2023-10-22 NOTE — Patient Instructions (Signed)
 YOU HAD AN ENDOSCOPIC PROCEDURE TODAY AT THE [facility]:   Refer to the procedure report that was given to you for any specific questions about what was found during the examination.  If the procedure report does not answer your questions, please call your gastroenterologist to clarify.  If you requested that your care partner not be given the details of your procedure findings, then the procedure report has been included in a sealed envelope for you to review at your convenience later.  YOU SHOULD EXPECT: Some feelings of bloating in the abdomen. Passage of more gas than usual.  Walking can help get rid of the air that was put into your GI tract during the procedure and reduce the bloating. If you had a lower endoscopy (such as a colonoscopy or flexible sigmoidoscopy) you may notice spotting of blood in your stool or on the toilet paper. If you underwent a bowel prep for your procedure, you may not have a normal bowel movement for a few days.  Please Note:  You might notice some irritation and congestion in your nose or some drainage.  This is from the oxygen used during your procedure.  There is no need for concern and it should clear up in a day or so.  SYMPTOMS TO REPORT IMMEDIATELY:  Following lower endoscopy (colonoscopy or flexible sigmoidoscopy):  Excessive amounts of blood in the stool  Significant tenderness or worsening of abdominal pains  Swelling of the abdomen that is new, acute  Fever of 100F or higher   For urgent or emergent issues, a gastroenterologist can be reached at any hour by calling [phone]. Do not use MyChart messaging for urgent concerns.    DIET:  We do recommend a small meal at first, but then you may proceed to your regular diet.  Drink plenty of fluids but you should avoid alcoholic beverages for 24 hours.  MEDICATIONS: Continue present medications.  FOLLOW UP: Await pathology results. Repeat colonoscopy in 5 years for surveillance as well  as personal and family history.  Handouts given to patient: Polyps.  Thank you for allowing us  to provide for your healthcare needs today.  ACTIVITY:  You should plan to take it easy for the rest of today and you should NOT DRIVE or use heavy machinery until tomorrow (because of the sedation medicines used during the test).    FOLLOW UP: Our staff will call the number listed on your records the next business day following your procedure.  We will call around 7:15- 8:00 am to check on you and address any questions or concerns that you may have regarding the information given to you following your procedure. If we do not reach you, we will leave a message.     If any biopsies were taken you will be contacted by phone or by letter within the next 1-3 weeks.  Please call us  at [phone] if you have not heard about the biopsies in 3 weeks.    SIGNATURES/CONFIDENTIALITY: You and/or your care partner have signed paperwork which will be entered into your electronic medical record.  These signatures attest to the fact that that the information above on your After Visit Summary has been reviewed and is understood.  Full responsibility of the confidentiality of this discharge information lies with you and/or your care-partner.

## 2023-10-22 NOTE — Progress Notes (Signed)
 Vss nad trans to pacu

## 2023-10-22 NOTE — Progress Notes (Signed)
 Pt's states no medical or surgical changes since previsit or office visit.

## 2023-10-22 NOTE — Op Note (Signed)
 [facility] Patient Name: Kelli Cruz Procedure Date: 10/22/2023 1:29 PM MRN: [id number] Endoscopist: Victory L. Legrand , MD, [id number] Age: 63 Referring MD:  Date of Birth: 1960/05/25 Gender: Female Account #: [id number] Procedure:                Colonoscopy Indications:              High risk colon cancer surveillance: Personal                            history of colon cancer (2010 rectosigmoid                            resection)                           Family history of colorectal cancer in father and                            paternal grandfather in their 3s                           Hyperplastic polyps August 2020 (Dr. Eda) Medicines:                Monitored Anesthesia Care Procedure:                Pre-Anesthesia Assessment:                           - Prior to the procedure, a History and Physical                            was performed, and patient medications and                            allergies were reviewed. [name] tolerance of                            previous anesthesia was also reviewed. The risks                            and benefits of the procedure and the sedation                            options and risks were discussed with the patient.                            All questions were answered, and informed consent                            was obtained. Prior Anticoagulants: The patient has                            taken no anticoagulant or antiplatelet agents. ASA  Grade Assessment: III - A patient with severe                            systemic disease. After reviewing the risks and                            benefits, the patient was deemed in satisfactory                            condition to undergo the procedure.                           After obtaining informed consent, the colonoscope                            was passed under direct vision. Throughout the                             procedure, the patient's blood pressure, pulse, and                            oxygen saturations were monitored continuously. The                            Olympus Scope SN: [id number] was introduced through                            the anus and advanced to the the cecum, identified                            by appendiceal orifice and ileocecal valve. The                            colonoscopy was performed without difficulty. The                            patient tolerated the procedure well. The quality                            of the bowel preparation was excellent. The                            ileocecal valve, appendiceal orifice, and rectum                            were photographed. Scope In: 1:36:56 PM Scope Out: 1:54:25 PM Scope Withdrawal Time: 0 hours 14 minutes 15 seconds  Total Procedure Duration: 0 hours 17 minutes 29 seconds  Findings:                 The perianal and digital rectal examinations were                            normal.  Repeat examination of right colon under NBI                            performed.                           A diminutive polyp was found in the ascending                            colon. The polyp was sessile. The polyp was removed                            with a cold snare. Resection and retrieval were                            complete. (Jar 1)                           A diminutive polyp was found in the descending                            colon. The polyp was sessile. The polyp was removed                            with a cold snare. Resection and retrieval were                            complete. (Jar 2)                           There was evidence of a prior end-to-end                            colo-rectal anastomosis in the distal rectum. This                            was patent and was characterized by healthy                            appearing mucosa.                           The exam  was otherwise without abnormality on                            direct and retroflexion views. Complications:            No immediate complications. Estimated Blood Loss:     Estimated blood loss was minimal. Impression:               - One diminutive polyp in the ascending colon,                            removed with a cold snare. Resected and retrieved.                           -  One diminutive polyp in the descending colon,                            removed with a cold snare. Resected and retrieved.                           - Patent end-to-end colo-rectal anastomosis,                            characterized by healthy appearing mucosa.                           - The examination was otherwise normal on direct                            and retroflexion views. Recommendation:           - Patient has a contact number available for                            emergencies. The signs and symptoms of potential                            delayed complications were discussed with the                            patient. Return to normal activities tomorrow.                            Written discharge instructions were provided to the                            patient.                           - Resume previous diet.                           - Continue present medications.                           - Await pathology results.                           - Repeat colonoscopy in 5 years for surveillance as                            well as personal and family history. Veto Macqueen L. Legrand, MD 10/22/2023 2:00:31 PM This report has been signed electronically.

## 2023-10-22 NOTE — Progress Notes (Signed)
 History and Physical:  This patient presents for endoscopic testing for: Encounter Diagnoses  Name Primary?   Personal history of colon cancer Yes   Family history of colon cancer     Surveillance colonoscopy today for this patient with Hx CRC in 2010 and a family Hx of CRC (father and PGF) Hyperplastic polyps on last colonoscopy with Dr Eda in August 2020  Patient denies chronic abdominal pain, rectal bleeding, constipation or diarrhea.   Patient is otherwise without complaints or active issues today.   Past Medical History: Past Medical History:  Diagnosis Date   Allergy 2013   When took medicine for overactive bladder   Anemia    Anxiety    Arthritis    Bipolar 1 disorder (HCC)    Blood transfusion without reported diagnosis 12/2018   Cancer (HCC) 2010   sigmoid colon cancer   Chronic kidney disease    Clotting disorder (HCC) 12/2018   Bleeding ulcer   Depression    Diabetes mellitus    type II   Endometriosis    Family history of malignant neoplasm of gastrointestinal tract    Ganglion cyst    GERD (gastroesophageal reflux disease)    Hyperlipidemia    Bleeding ulcer   Hypothyroidism    Insomnia due to mental condition    Irregular heart beat    Myocardial infarction (HCC) 2023   Nausea & vomiting 12/29/2018   Osteoporosis 01/17/2023   Oxygen deficiency    Sleep apnea    Ulcer 12/2018   Bleeding ulcer     Past Surgical History: Past Surgical History:  Procedure Laterality Date   ABDOMINAL HYSTERECTOMY  2008   BIOPSY  12/31/2018   Procedure: BIOPSY;  Surgeon: Legrand Victory LITTIE DOUGLAS, MD;  Location: [facility];  Service: Gastroenterology;;   BREAST SURGERY     Had a small lump in left breast checked. Was not cancer   COLON RESECTION  07/14/2008   COLON SURGERY  2010   COLONOSCOPY     DILATION AND CURETTAGE OF UTERUS  02/14/2004   miscarriage    ESOPHAGOGASTRODUODENOSCOPY N/A 12/31/2018   Procedure: ESOPHAGOGASTRODUODENOSCOPY (EGD);  Surgeon:  Legrand Victory LITTIE DOUGLAS, MD;  [facility];  Service: Gastroenterology;  Laterality: N/A;   GANGLION CYST EXCISION     HERNIA REPAIR     Hernia after colon cancer   INTERSTIM IMPLANT PLACEMENT Left 12/11/2012   stimulator is on the left but the electrodes go to the right   KNEE DISLOCATION SURGERY     LAPAROSCOPY  02/14/1995   D & C for endometriosis    Allergies: Allergies  Allergen Reactions   Mirabegron Other (See Comments)    Trouble breathing and swollen tongue   Keflex [Cephalexin] Nausea And Vomiting and Other (See Comments)    Light-headed/dizziness/weakness   Ace Inhibitors Cough    Outpatient Meds: Current Outpatient Medications  Medication Sig Dispense Refill   ALPRAZolam  (XANAX ) 0.5 MG tablet Take 0.5 mg by mouth at bedtime as needed for anxiety (panic attack).      amphetamine -dextroamphetamine  (ADDERALL  XR) 15 MG 24 hr capsule Take 15 mg by mouth daily before lunch.     amphetamine -dextroamphetamine  (ADDERALL  XR) 30 MG 24 hr capsule Take 30 mg by mouth daily at 6 (six) AM.      aspirin  EC 81 MG tablet Take 1 tablet (81 mg total) by mouth daily. Swallow whole. 30 tablet 11   atorvastatin  (LIPITOR) 40 MG tablet TAKE 1 TABLET DAILY 90 tablet 3  Blood Glucose Monitoring Suppl (ONETOUCH VERIO FLEX SYSTEM) w/Device KIT USE TO CHECK BLOOD SUGAR TWICE A DAY (DX. E11.9) 1 kit 0   cariprazine  (VRAYLAR ) capsule Take 3 mg by mouth every 3 (three) days.      Cholecalciferol (VITAMIN D3) 125 MCG (5000 UT) CAPS Take 5,000 Units by mouth daily.     citalopram  (CELEXA ) 40 MG tablet Take 40 mg by mouth at bedtime.      doxepin  (SINEQUAN ) 50 MG capsule Take 50 mg by mouth at bedtime.   1   gabapentin  (NEURONTIN ) 300 MG capsule Take 300 mg by mouth 2 (two) times daily.     levothyroxine  (SYNTHROID ) 100 MCG tablet TAKE 1 TABLET BY MOUTH EVERY DAY BEFORE BREAKFAST 90 tablet 1   losartan  (COZAAR ) 25 MG tablet TAKE 1 TABLET DAILY 90 tablet 3   metoprolol  succinate (TOPROL -XL) 25 MG  24 hr tablet TAKE 1/2 TABLET DAILY (DOSEDECREASE) 45 tablet 3   pantoprazole  (PROTONIX ) 40 MG tablet TAKE 1 TABLET EVERY MORNING 90 tablet 0   cyanocobalamin  (,VITAMIN B-12,) 1000 MCG/ML injection Inject 1 mL (1,000 mcg total) into the muscle every 3 (three) months. every 8 weeks (Patient not taking: Reported on 09/17/2023) 1 mL 3   empagliflozin  (JARDIANCE ) 25 MG TABS tablet Take 1 tablet (25 mg total) by mouth daily before breakfast. (Patient not taking: Reported on 09/17/2023) 90 tablet 3   ferrous sulfate 325 (65 FE) MG tablet Take 325 mg by mouth daily with breakfast.     fluticasone  (FLONASE ) 50 MCG/ACT nasal spray SPRAY 2 SPRAYS INTO EACH NOSTRIL EVERY DAY 48 mL 2   glucose blood (ONETOUCH VERIO) test strip Use to check blood sugar twice daily 200 strip 3   Lancets (ONETOUCH DELICA PLUS LANCET33G) MISC USE TO CHECK BLOOD SUGAR TWICE A DAY 200 each 1   loratadine  (CLARITIN ) 10 MG tablet TAKE 1 TABLET BY MOUTH EVERY DAY 90 tablet 1   tirzepatide  (MOUNJARO ) 15 MG/0.5ML Pen INJECT 15 MG INTO THE SKIN ONCE A WEEK. 2 mL 5   Current Facility-Administered Medications  Medication Dose Route Frequency Provider Last Rate Last Admin   0.9 %  sodium chloride  infusion  500 mL Intravenous Once Danis, Victory CROME III, MD          ___________________________________________________________________ Objective   Exam:  BP (!) 116/56   Pulse 76   Temp (!) 97.3 F (36.3 C) (Temporal)   Ht 5' 2 (1.575 m)   Wt 175 lb (79.4 kg)   LMP 07/15/2006   SpO2 99%   BMI 32.01 kg/m   CV: regular , S1/S2 Resp: clear to auscultation bilaterally, normal RR and effort noted GI: soft, no tenderness, with active bowel sounds.   Assessment: Encounter Diagnoses  Name Primary?   Personal history of colon cancer Yes   Family history of colon cancer      Plan: Colonoscopy   The benefits and risks of the planned procedure(s) were described in detail with the patient or (when appropriate) their health care proxy.   Risks were outlined as including, but not limited to, bleeding, infection, perforation, adverse medication reaction leading to cardiac or pulmonary decompensation, pancreatitis (if ERCP).  The limitation of incomplete mucosal visualization was also discussed.  No guarantees or warranties were given.  The patient is appropriate for an endoscopic procedure in the ambulatory setting.   - Victory Brand, MD

## 2023-10-23 ENCOUNTER — Telehealth: Payer: Self-pay

## 2023-10-23 ENCOUNTER — Ambulatory Visit: Admitting: Family Medicine

## 2023-10-23 NOTE — Telephone Encounter (Signed)
  Follow up Call-     10/22/2023    1:03 PM  Call back number  Post procedure Call Back phone  # [phone]  Permission to leave phone message Yes     Patient questions:  Do you have a fever, pain , or abdominal swelling? No. Pain Score  0 *  Have you tolerated food without any problems? Yes.    Have you been able to return to your normal activities? Yes.    Do you have any questions about your discharge instructions: Diet   No. Medications  No. Follow up visit  No  Do you have questions or concerns about your Care? No.  Actions: * If pain score is 4 or above: No action needed, pain <4.

## 2023-10-25 ENCOUNTER — Other Ambulatory Visit: Payer: Self-pay | Admitting: Internal Medicine

## 2023-10-25 ENCOUNTER — Other Ambulatory Visit: Payer: Self-pay | Admitting: Gastroenterology

## 2023-10-25 ENCOUNTER — Ambulatory Visit: Payer: Self-pay | Admitting: Gastroenterology

## 2023-10-25 LAB — SURGICAL PATHOLOGY

## 2023-11-04 ENCOUNTER — Other Ambulatory Visit: Payer: Self-pay | Admitting: Family Medicine

## 2023-11-05 NOTE — Telephone Encounter (Signed)
 If she has enough pills to get by will refill tomorrow or when we get labs back  If she is totally out let mw know

## 2023-11-05 NOTE — Telephone Encounter (Signed)
 Has thyroid  f/u OV tomorrow at 8:00 w/ Dr Randeen.

## 2023-11-06 ENCOUNTER — Encounter: Payer: Self-pay | Admitting: Family Medicine

## 2023-11-06 ENCOUNTER — Ambulatory Visit: Payer: Self-pay | Admitting: Family Medicine

## 2023-11-06 ENCOUNTER — Ambulatory Visit: Admitting: Family Medicine

## 2023-11-06 VITALS — BP 116/66 | HR 78 | Temp 98.3°F | Ht 62.0 in | Wt 174.1 lb

## 2023-11-06 DIAGNOSIS — N1832 Chronic kidney disease, stage 3b: Secondary | ICD-10-CM

## 2023-11-06 DIAGNOSIS — E039 Hypothyroidism, unspecified: Secondary | ICD-10-CM

## 2023-11-06 DIAGNOSIS — E119 Type 2 diabetes mellitus without complications: Secondary | ICD-10-CM | POA: Diagnosis not present

## 2023-11-06 DIAGNOSIS — R7401 Elevation of levels of liver transaminase levels: Secondary | ICD-10-CM

## 2023-11-06 DIAGNOSIS — E1169 Type 2 diabetes mellitus with other specified complication: Secondary | ICD-10-CM | POA: Diagnosis not present

## 2023-11-06 DIAGNOSIS — Z23 Encounter for immunization: Secondary | ICD-10-CM

## 2023-11-06 DIAGNOSIS — Z7985 Long-term (current) use of injectable non-insulin antidiabetic drugs: Secondary | ICD-10-CM

## 2023-11-06 DIAGNOSIS — E785 Hyperlipidemia, unspecified: Secondary | ICD-10-CM

## 2023-11-06 LAB — BASIC METABOLIC PANEL WITH GFR
BUN: 26 mg/dL — ABNORMAL HIGH (ref 6–23)
CO2: 26 meq/L (ref 19–32)
Calcium: 9.6 mg/dL (ref 8.4–10.5)
Chloride: 103 meq/L (ref 96–112)
Creatinine, Ser: 1.18 mg/dL (ref 0.40–1.20)
GFR: 50.06 mL/min — ABNORMAL LOW (ref >60.00)
Glucose, Bld: 128 mg/dL — ABNORMAL HIGH (ref 70–99)
Potassium: 4.6 meq/L (ref 3.5–5.1)
Sodium: 138 meq/L (ref 135–145)

## 2023-11-06 LAB — HEPATIC FUNCTION PANEL
ALT: 12 U/L (ref 0–35)
AST: 15 U/L (ref 0–37)
Albumin: 4 g/dL (ref 3.5–5.2)
Alkaline Phosphatase: 70 U/L (ref 39–117)
Bilirubin, Direct: 0.1 mg/dL (ref 0.0–0.3)
Total Bilirubin: 0.5 mg/dL (ref 0.2–1.2)
Total Protein: 6.3 g/dL (ref 6.0–8.3)

## 2023-11-06 LAB — POCT GLYCOSYLATED HEMOGLOBIN (HGB A1C): Hemoglobin A1C: 6.3 % — AB (ref 4.0–5.6)

## 2023-11-06 LAB — TSH: TSH: 0.15 u[IU]/mL — ABNORMAL LOW (ref 0.35–5.50)

## 2023-11-06 MED ORDER — LEVOTHYROXINE SODIUM 88 MCG PO TABS: 88.0000 ug | ORAL_TABLET | Freq: Every day | ORAL | 0 refills | Status: DC

## 2023-11-06 NOTE — Progress Notes (Signed)
 Subjective:    Patient ID: Kelli Cruz, female    DOB: 13-Dec-1960, 63 y.o.   MRN: [id number]  HPI  Wt Readings from Last 3 Encounters:  11/06/23 174 lb 2 oz (79 kg)  10/22/23 175 lb (79.4 kg)  09/17/23 175 lb (79.4 kg)   31.85 kg/m  Vitals:   11/06/23 0753  BP: 116/66  Pulse: 78  Temp: 98.3 F (36.8 C)  SpO2: 98%    Pt presents for follow up of  DM2 Hypothyroidism HTN Hypothyroid  Flu shot   Ran out of her vraylar - waiting for refill  Had to cancel last appointment to take care of her brother   Had her colonoscopy done     HTN bp is stable today  No cp or palpitations or headaches or edema  No side effects to medicines  BP Readings from Last 3 Encounters:  11/06/23 116/66  10/22/23 133/60  09/10/23 100/60    Losartan  25 mg daily  Metoprolol  xl 12.5 mg daily  CKD 3b Lab Results  Component Value Date   NA 140 07/13/2023   K 4.6 07/13/2023   CO2 26 07/13/2023   GLUCOSE 106 (H) 07/13/2023   BUN 35 (H) 07/13/2023   CREATININE 1.26 (H) 07/13/2023   CALCIUM  9.0 07/13/2023   GFR 46.38 (L) 07/13/2023   EGFR 44 (L) 12/02/2021   GFRNONAA 38 (L) 06/03/2019  Fluid intake not as good as it should be   Hypothyroidism  Pt has no clinical changes No change in energy level/ hair or skin/ edema and no tremor Lab Results  Component Value Date   TSH 6.32 (H) 07/13/2023    Levothyroxine  100 mcg daily in am - last visit changed dosing of this to separate from other medicines or food     DM2 Glucose 86-120 at different times of day   DM diet  Trying to do better with eating  If she cheats- has just a bite  Needs more veggies   Gained some weight and lost some   Exercise  Not a lot - long hours -work /caring for her brother  Work -walking some day    Mounaro 15 mg weekly  Jardiance  25 mg daily- -planned to start this again (try) but has not yet    Lab Results  Component Value Date   HGBA1C 6.3 (A) 11/06/2023   HGBA1C 7.0 (H) 07/13/2023    HGBA1C 6.1 (A) 04/18/2023   Lab Results  Component Value Date   MICROALBUR <0.7 07/17/2023   MICROALBUR 0.7 06/08/2008    Renal protection- arb  Eye exam is planned for November    Hyperlipidemia Lab Results  Component Value Date   CHOL 127 07/13/2023   HDL 47.20 07/13/2023   LDLCALC 60 07/13/2023   LDLDIRECT 104.0 05/11/2021   TRIG 97.0 07/13/2023   CHOLHDL 3 07/13/2023   Atorvastatin  40 mg daily  ALT was elevated in may  Lab Results  Component Value Date   ALT 57 (H) 07/13/2023   AST 30 07/13/2023   ALKPHOS 74 07/13/2023   BILITOT 0.5 07/13/2023   Was taking tylenol   Less now    Patient Active Problem List   Diagnosis Date Noted   Elevated ALT measurement 11/06/2023   GERD (gastroesophageal reflux disease) 07/20/2023   Dizziness 07/05/2023   Headache 07/05/2023   Osteoporosis 01/17/2023   Vision changes 08/28/2022   Aortic atherosclerosis 07/03/2022   Frequent UTI 03/23/2022   Chest wall pain 06/27/2021   Chronic cough 06/08/2021  Class 1 obesity due to excess calories with serious comorbidity and body mass index (BMI) of 33.0 to 33.9 in adult 05/11/2021   Irregular heart beat 05/11/2021   History of total abdominal hysterectomy 01/22/2020   At high risk for breast cancer 01/22/2020   Hypertensive disorder 01/22/2020   Chronic kidney disease, stage 3b (HCC) 02/03/2019   History of GI bleed 12/29/2018   Anemia 02/19/2017   Vitamin D  deficiency 02/19/2017   Proteinuria 02/18/2016   Hypersomnia with sleep apnea 01/06/2015   Insomnia due to mental condition    Burning sensation of mouth 08/12/2014   Routine general medical examination at a health care facility 10/24/2011   B12 deficiency 04/13/2010   History of colon cancer 08/11/2008   Hyperlipidemia associated with type 2 diabetes mellitus (HCC) 07/08/2008   Hypothyroidism 09/12/2006   Diabetes mellitus treated with injections of non-insulin  medication (HCC) 09/12/2006   Depression with anxiety  09/12/2006   ALLERGIC RHINITIS, SEASONAL 09/12/2006   FIBROCYSTIC BREAST DISEASE 09/12/2006   MIGRAINES, HX OF 09/12/2006   Past Medical History:  Diagnosis Date   Allergy 2013   When took medicine for overactive bladder   Anemia    Anxiety    Arthritis    Bipolar 1 disorder (HCC)    Blood transfusion without reported diagnosis 12/2018   Cancer (HCC) 2010   sigmoid colon cancer   Chronic kidney disease    Clotting disorder 12/2018   Bleeding ulcer   Depression    Diabetes mellitus    type II   Endometriosis    Family history of malignant neoplasm of gastrointestinal tract    Ganglion cyst    GERD (gastroesophageal reflux disease)    Hyperlipidemia    Bleeding ulcer   Hypothyroidism    Insomnia due to mental condition    Irregular heart beat    Myocardial infarction (HCC) 2023   Nausea & vomiting 12/29/2018   Osteoporosis 01/17/2023   Oxygen deficiency    Sleep apnea    Ulcer 12/2018   Bleeding ulcer   Past Surgical History:  Procedure Laterality Date   ABDOMINAL HYSTERECTOMY  2008   BIOPSY  12/31/2018   Procedure: BIOPSY;  Surgeon: Legrand Victory LITTIE DOUGLAS, MD;  Location: [facility];  Service: Gastroenterology;;   BREAST SURGERY     Had a small lump in left breast checked. Was not cancer   COLON RESECTION  07/14/2008   COLON SURGERY  2010   COLONOSCOPY     DILATION AND CURETTAGE OF UTERUS  02/14/2004   miscarriage    ESOPHAGOGASTRODUODENOSCOPY N/A 12/31/2018   Procedure: ESOPHAGOGASTRODUODENOSCOPY (EGD);  Surgeon: Legrand Victory LITTIE DOUGLAS, MD;  Location: [facility] ENDOSCOPY;  Service: Gastroenterology;  Laterality: N/A;   GANGLION CYST EXCISION     HERNIA REPAIR     Hernia after colon cancer   INTERSTIM IMPLANT PLACEMENT Left 12/11/2012   stimulator is on the left but the electrodes go to the right   KNEE DISLOCATION SURGERY     LAPAROSCOPY  02/14/1995   D & C for endometriosis   Social History   Tobacco Use   Smoking status: Never    Passive exposure: Yes    Smokeless tobacco: Never  Vaping Use   Vaping status: Never Used  Substance Use Topics   Alcohol use: Yes    Comment: 1-2 / year   Drug use: No   Family History  Problem Relation Age of Onset   Breast cancer Mother    Diabetes Mother  Coronary artery disease Mother    Kidney disease Mother        renal insufficiency   Hyperlipidemia Mother    Anxiety disorder Mother    Arthritis Mother    Cancer Mother    Depression Mother    Heart disease Mother    Miscarriages / Stillbirths Mother    Obesity Mother    Vision loss Mother    Diabetes Father    Coronary artery disease Father    Colon cancer Father    Anxiety disorder Father    Arthritis Father    Hearing loss Father    Colon cancer Paternal Grandmother    Breast cancer Maternal Aunt    Breast cancer Paternal Aunt    Anxiety disorder Brother    Depression Brother    Diabetes Brother    Kidney disease Brother    Cancer Maternal Aunt    Depression Brother    Diabetes Brother    Early death Brother    Anxiety disorder Brother    Esophageal cancer Neg Hx    Rectal cancer Neg Hx    Stomach cancer Neg Hx    Allergies  Allergen Reactions   Mirabegron Other (See Comments)    Trouble breathing and swollen tongue   Keflex [Cephalexin] Nausea And Vomiting and Other (See Comments)    Light-headed/dizziness/weakness   Ace Inhibitors Cough   Current Outpatient Medications on File Prior to Visit  Medication Sig Dispense Refill   ALPRAZolam  (XANAX ) 0.5 MG tablet Take 0.5 mg by mouth at bedtime as needed for anxiety (panic attack).      amphetamine -dextroamphetamine  (ADDERALL  XR) 15 MG 24 hr capsule Take 15 mg by mouth daily before lunch.     amphetamine -dextroamphetamine  (ADDERALL  XR) 30 MG 24 hr capsule Take 30 mg by mouth daily at 6 (six) AM.      aspirin  EC 81 MG tablet Take 1 tablet (81 mg total) by mouth daily. Swallow whole. 30 tablet 11   atorvastatin  (LIPITOR) 40 MG tablet TAKE 1 TABLET DAILY 90 tablet 3   Blood  Glucose Monitoring Suppl (ONETOUCH VERIO FLEX SYSTEM) w/Device KIT USE TO CHECK BLOOD SUGAR TWICE A DAY (DX. E11.9) 1 kit 0   cariprazine  (VRAYLAR ) capsule Take 3 mg by mouth every 3 (three) days.      Cholecalciferol (VITAMIN D3) 125 MCG (5000 UT) CAPS Take 5,000 Units by mouth daily.     citalopram  (CELEXA ) 40 MG tablet Take 40 mg by mouth at bedtime.      cyanocobalamin  (,VITAMIN B-12,) 1000 MCG/ML injection Inject 1 mL (1,000 mcg total) into the muscle every 3 (three) months. every 8 weeks 1 mL 3   doxepin  (SINEQUAN ) 50 MG capsule Take 50 mg by mouth at bedtime.   1   ferrous sulfate 325 (65 FE) MG tablet Take 325 mg by mouth daily with breakfast.     fluticasone  (FLONASE ) 50 MCG/ACT nasal spray SPRAY 2 SPRAYS INTO EACH NOSTRIL EVERY DAY 48 mL 2   gabapentin  (NEURONTIN ) 300 MG capsule Take 300 mg by mouth 2 (two) times daily.     glucose blood (ONETOUCH VERIO) test strip Use to check blood sugar twice daily 200 strip 3   Lancets (ONETOUCH DELICA PLUS LANCET33G) MISC USE TO CHECK BLOOD SUGAR TWICE A DAY 200 each 1   levothyroxine  (SYNTHROID ) 100 MCG tablet TAKE 1 TABLET BY MOUTH EVERY DAY BEFORE BREAKFAST 90 tablet 1   loratadine  (CLARITIN ) 10 MG tablet TAKE 1 TABLET BY MOUTH EVERY DAY  90 tablet 1   losartan  (COZAAR ) 25 MG tablet TAKE 1 TABLET DAILY 90 tablet 3   metoprolol  succinate (TOPROL -XL) 25 MG 24 hr tablet Take 0.5 tablets (12.5 mg total) by mouth daily. 45 tablet 3   pantoprazole  (PROTONIX ) 40 MG tablet Take 1 tablet (40 mg total) by mouth every morning. NEEDS APPOINTMENT FOR ADDITIONAL REFILLS. 90 tablet 0   tirzepatide  (MOUNJARO ) 15 MG/0.5ML Pen INJECT 15 MG INTO THE SKIN ONCE A WEEK. 2 mL 5   empagliflozin  (JARDIANCE ) 25 MG TABS tablet Take 1 tablet (25 mg total) by mouth daily before breakfast. (Patient not taking: Reported on 11/06/2023) 90 tablet 3   No current facility-administered medications on file prior to visit.    Review of Systems  Constitutional:  Negative for  activity change, appetite change, fatigue, fever and unexpected weight change.  HENT:  Negative for congestion, ear pain, rhinorrhea, sinus pressure and sore throat.   Eyes:  Negative for pain, redness and visual disturbance.  Respiratory:  Negative for cough, shortness of breath and wheezing.   Cardiovascular:  Negative for chest pain and palpitations.  Gastrointestinal:  Negative for abdominal pain, blood in stool, constipation and diarrhea.  Endocrine: Negative for polydipsia and polyuria.  Genitourinary:  Negative for dysuria, frequency and urgency.  Musculoskeletal:  Negative for arthralgias, back pain and myalgias.  Skin:  Negative for pallor and rash.  Allergic/Immunologic: Negative for environmental allergies.  Neurological:  Negative for dizziness, syncope and headaches.  Hematological:  Negative for adenopathy. Does not bruise/bleed easily.  Psychiatric/Behavioral:  Negative for decreased concentration and dysphoric mood. The patient is nervous/anxious.        Waiting on refill of vraylar  Was doing well prior to that Under psychiatric care        Objective:   Physical Exam Constitutional:      General: She is not in acute distress.    Appearance: Normal appearance. She is well-developed. She is obese. She is not ill-appearing or diaphoretic.  HENT:     Head: Normocephalic and atraumatic.  Eyes:     Conjunctiva/sclera: Conjunctivae normal.     Pupils: Pupils are equal, round, and reactive to light.  Neck:     Thyroid : No thyromegaly.     Vascular: No carotid bruit or JVD.  Cardiovascular:     Rate and Rhythm: Normal rate and regular rhythm.     Heart sounds: Normal heart sounds.     No gallop.  Pulmonary:     Effort: Pulmonary effort is normal. No respiratory distress.     Breath sounds: Normal breath sounds. No wheezing or rales.  Abdominal:     General: There is no distension or abdominal bruit.     Palpations: Abdomen is soft.  Musculoskeletal:     Cervical  back: Normal range of motion and neck supple.     Right lower leg: No edema.     Left lower leg: No edema.  Lymphadenopathy:     Cervical: No cervical adenopathy.  Skin:    General: Skin is warm and dry.     Coloration: Skin is not pale.     Findings: No rash.  Neurological:     Mental Status: She is alert.     Coordination: Coordination normal.     Deep Tendon Reflexes: Reflexes are normal and symmetric. Reflexes normal.  Psychiatric:        Mood and Affect: Mood normal.           Assessment & Plan:  Problem List Items Addressed This Visit       Endocrine   Hypothyroidism   TSH today  Last visit encouraged to take her levothyroxine  100 mcg separately from other medicines and food  No clinical changes       Relevant Orders   TSH   Hyperlipidemia associated with type 2 diabetes mellitus (HCC)   Disc goals for lipids and reasons to control them Rev last labs with pt Rev low sat fat diet in detail LDL of 60 HDL low but improved - encouraged exercise as tolerated  Plan to continue atorvastatin  40 mg daily        Diabetes mellitus treated with injections of non-insulin  medication (HCC) - Primary   Lab Results  Component Value Date   HGBA1C 6.3 (A) 11/06/2023   HGBA1C 7.0 (H) 07/13/2023   HGBA1C 6.1 (A) 04/18/2023   Microalb utd  Taking mounjaro  15 mg weekly  Still holding jardiance   On statin and arb Eye exam planned for nov Normal foot exam   Bmet  today / ckd  Encouraged more fluids Encouraged to keep working on low glycemic diet       Relevant Orders   POCT glycosylated hemoglobin (Hb A1C) (Completed)     Genitourinary   Chronic kidney disease, stage 3b (HCC)   Bmet today Encouraged to drink more fluids  Holding jardiance  currently  On losartan  25 mg daily  Blood pressure is good   Has not been to nephrologist in a while       Relevant Orders   Basic metabolic panel with GFR     Other   Elevated ALT measurement   Last visit  57 Other live labs normal  Had taken more tylenol  at the time On less now   No etoh Does take a statin    Lab today      Relevant Orders   Hepatic function panel

## 2023-11-06 NOTE — Patient Instructions (Addendum)
 Add some strength training to your routine, this is important for bone and brain health and can reduce your risk of falls and help your body use insulin  properly and regulate weight  Light weights, exercise bands , and internet videos are a good way to start  Yoga (chair or regular), machines , floor exercises or a gym with machines are also good options   I don't want mounjaro  to cause you to loose muscle mass   Remember to try and drink more fluids for kidney health    Try and remember to take care of yourself   Labs today As soon as labs return I will send thyroid  medicine to the pharmacy   Hold off on jardiance  until we get kidney labs back

## 2023-11-06 NOTE — Assessment & Plan Note (Addendum)
 Last visit 57 Other live labs normal  Had taken more tylenol  at the time On less now   No etoh Does take a statin    Lab today

## 2023-11-06 NOTE — Assessment & Plan Note (Signed)
 Disc goals for lipids and reasons to control them Rev last labs with pt Rev low sat fat diet in detail LDL of 60 HDL low but improved - encouraged exercise as tolerated  Plan to continue atorvastatin  40 mg daily

## 2023-11-06 NOTE — Assessment & Plan Note (Addendum)
 Lab Results  Component Value Date   HGBA1C 6.3 (A) 11/06/2023   HGBA1C 7.0 (H) 07/13/2023   HGBA1C 6.1 (A) 04/18/2023   Microalb utd  Taking mounjaro  15 mg weekly  Still holding jardiance   On statin and arb Eye exam planned for nov Normal foot exam   Bmet  today / ckd  Encouraged more fluids Encouraged to keep working on low glycemic diet

## 2023-11-06 NOTE — Assessment & Plan Note (Signed)
 TSH today  Last visit encouraged to take her levothyroxine  100 mcg separately from other medicines and food  No clinical changes

## 2023-11-06 NOTE — Assessment & Plan Note (Signed)
 Bmet today Encouraged to drink more fluids  Holding jardiance  currently  On losartan  25 mg daily  Blood pressure is good   Has not been to nephrologist in a while

## 2023-11-07 ENCOUNTER — Other Ambulatory Visit: Payer: Self-pay | Admitting: Gastroenterology

## 2023-11-07 NOTE — Telephone Encounter (Signed)
 Rx sent 11/06/23, #90/0 refills to [facility], [place].   Request denied.

## 2023-12-04 ENCOUNTER — Telehealth: Payer: Self-pay | Admitting: Gastroenterology

## 2023-12-04 NOTE — Telephone Encounter (Signed)
 The pt wanted to discuss the pathology results letter she received.  All questions answered to the best of my ability. Nothing further needed.  She will reach out with any further questions.

## 2023-12-04 NOTE — Telephone Encounter (Signed)
 Left message on machine to call back

## 2023-12-04 NOTE — Telephone Encounter (Signed)
 Patient requesting f/u call in regards to results, please advise.

## 2023-12-13 ENCOUNTER — Ambulatory Visit: Payer: Self-pay | Admitting: Family Medicine

## 2023-12-13 ENCOUNTER — Other Ambulatory Visit (INDEPENDENT_AMBULATORY_CARE_PROVIDER_SITE_OTHER)

## 2023-12-13 ENCOUNTER — Other Ambulatory Visit: Payer: Self-pay | Admitting: Family Medicine

## 2023-12-13 DIAGNOSIS — E039 Hypothyroidism, unspecified: Secondary | ICD-10-CM | POA: Diagnosis not present

## 2023-12-13 LAB — TSH: TSH: 0.47 u[IU]/mL (ref 0.35–5.50)

## 2023-12-13 NOTE — Telephone Encounter (Signed)
 Looks like med was d/c at ER back in may but then added back as a historical entry. PCP last filled on 06/07/23 #180 caps/ 1 refill  Last OV was on 11/06/23

## 2024-01-08 LAB — OPHTHALMOLOGY REPORT-SCANNED

## 2024-01-25 ENCOUNTER — Ambulatory Visit: Admitting: Nurse Practitioner

## 2024-01-25 ENCOUNTER — Ambulatory Visit: Payer: Self-pay

## 2024-01-25 ENCOUNTER — Encounter: Payer: Self-pay | Admitting: Nurse Practitioner

## 2024-01-25 VITALS — BP 114/68 | HR 77 | Temp 98.1°F | Ht 62.0 in | Wt 176.4 lb

## 2024-01-25 DIAGNOSIS — L84 Corns and callosities: Secondary | ICD-10-CM

## 2024-01-25 NOTE — Telephone Encounter (Signed)
 Aware, will watch for correspondence Thanks for seeing her

## 2024-01-25 NOTE — Progress Notes (Signed)
 Established Patient Office Visit  Subjective:  Patient ID: Kelli Cruz, female    DOB: Jun 30, 1960  Age: 63 y.o. MRN: [id number]  CC:  Chief Complaint  Patient presents with   Acute Visit    Left lateral foot painful callus and left 4th toe x 1-2 months   Discussed the use of AI scribe software for clinical note transcription with the patient, who gave verbal consent to proceed.  History of Present Illness Discussed the use of AI scribe software for clinical note transcription with the patient, who gave verbal consent to proceed.  History of Present Illness   [name] is a 63 year old female with diabetes who presents with painful calluses and redness on her left foot.  She reports a painful callus on the lateral left foot with intermittent redness and swelling, making walking difficult and painful when removing her shoe at night. Changing shoes multiple times has not helped. She has tried Epsom salt soaks and a foot scraper without relief. The callus has swelling is worse at the curve of the foot. 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       Past Medical History:  Diagnosis Date   Allergy 2013   When took medicine for overactive bladder   Anemia    Anxiety    Arthritis    Bipolar 1 disorder (HCC)    Blood transfusion without reported diagnosis 12/2018   Cancer (HCC) 2010   sigmoid colon cancer   Chronic kidney disease    Clotting disorder 12/2018   Bleeding ulcer   Depression    Diabetes mellitus    type II   Endometriosis    Family history of malignant neoplasm of gastrointestinal tract    Ganglion cyst    GERD (gastroesophageal reflux disease)    Hyperlipidemia    Bleeding ulcer   Hypothyroidism    Insomnia due to mental condition    Irregular heart beat    Myocardial infarction (HCC) 2023   Nausea & vomiting 12/29/2018   Osteoporosis 01/17/2023   Oxygen  deficiency    Sleep apnea    Ulcer 12/2018   Bleeding ulcer    Past Surgical History:  Procedure Laterality Date   ABDOMINAL HYSTERECTOMY  2008   BIOPSY  12/31/2018   Procedure: BIOPSY;  Surgeon: Legrand Victory LITTIE DOUGLAS, MD;  Location: [facility] ENDOSCOPY;  Service: Gastroenterology;;   BREAST SURGERY     Had a small lump in left breast checked. Was not cancer   COLON RESECTION  07/14/2008   COLON SURGERY  2010   COLONOSCOPY     DILATION AND CURETTAGE OF UTERUS  02/14/2004   miscarriage    ESOPHAGOGASTRODUODENOSCOPY N/A 12/31/2018   Procedure: ESOPHAGOGASTRODUODENOSCOPY (EGD);  Surgeon: Legrand Victory LITTIE DOUGLAS, MD;  Location: [facility] ENDOSCOPY;  Service: Gastroenterology;  Laterality: N/A;   GANGLION CYST EXCISION     HERNIA REPAIR     Hernia after colon cancer   INTERSTIM IMPLANT PLACEMENT Left 12/11/2012   stimulator is on the left but the electrodes go to the right   KNEE DISLOCATION SURGERY     LAPAROSCOPY  02/14/1995   D & C for endometriosis    Family History  Problem Relation Age of Onset   Breast cancer Mother    Diabetes Mother    Coronary artery disease Mother    Kidney disease Mother  renal insufficiency   Hyperlipidemia Mother    Anxiety disorder Mother    Arthritis Mother    Cancer Mother    Depression Mother    Heart disease Mother    Miscarriages / Stillbirths Mother    Obesity Mother    Vision loss Mother    Diabetes Father    Coronary artery disease Father    Colon cancer Father    Anxiety disorder Father    Arthritis Father    Hearing loss Father    Colon cancer Paternal Grandmother    Breast cancer Maternal Aunt    Breast cancer Paternal Aunt    Anxiety disorder Brother    Depression Brother    Diabetes Brother    Kidney disease Brother    Cancer Maternal Aunt    Depression Brother    Diabetes Brother    Early death Brother    Anxiety disorder Brother    Esophageal cancer Neg Hx    Rectal cancer Neg Hx    Stomach cancer Neg Hx     Social History    Socioeconomic History   Marital status: Married    Spouse name: Not on file   Number of children: 0   Years of education: Not on file   Highest education level: Bachelor's degree (e.g., BA, AB, BS)  Occupational History   Occupation: Teaching laboratory technician    Comment: [employer]   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   Tobacco Use   Smoking status: Never    Passive exposure: Yes   Smokeless tobacco: Never  Vaping Use   Vaping status: Never Used  Substance and Sexual Activity   Alcohol use: Yes    Comment: 1-2 / year   Drug use: No   Sexual activity: Yes    Birth control/protection: Surgical, Post-menopausal    Comment: Had hysterectomy in 2008  Other Topics Concern   Not on file  Social History Narrative   Daily caffeine use   Social Drivers of Health   Tobacco Use: Medium Risk (01/25/2024)   Patient History    Smoking Tobacco Use: Never    Smokeless Tobacco Use: Never    Passive Exposure: Yes  Financial Resource Strain: Low Risk (11/02/2023)   Overall Financial Resource Strain (CARDIA)    Difficulty of Paying Living Expenses: Not hard at all  Food Insecurity: No Food Insecurity (11/02/2023)   Epic    Worried About Programme Researcher, Broadcasting/film/video in the Last Year: Never true    Ran Out of Food in the Last Year: Never true  Transportation Needs: No Transportation Needs (11/02/2023)   Epic    Lack of Transportation (Medical): No    Lack of Transportation (Non-Medical): No  Physical Activity: Insufficiently Active (11/02/2023)   Exercise Vital Sign    Days of Exercise per Week: 1 day    Minutes of Exercise per Session: 10 min  Stress: No Stress Concern Present (11/02/2023)   [employer] of Occupational Health - Occupational Stress Questionnaire    Feeling of Stress: Only a little  Social Connections: Moderately Isolated (11/02/2023)   Social Connection and Isolation Panel    Frequency of Communication with Friends and Family: More than three times a week     Frequency of Social Gatherings with Friends and Family: Once a week    Attends Religious Services: Never    Database Administrator or Organizations: No    Attends Banker Meetings: Not on file  Marital Status: Married  Catering Manager Violence: Not on file  Depression (PHQ2-9): Low Risk (01/25/2024)   Depression (PHQ2-9)    PHQ-2 Score: 2  Alcohol Screen: Not on file  Housing: Low Risk (11/02/2023)   Epic    Unable to Pay for Housing in the Last Year: No    Number of Times Moved in the Last Year: 0    Homeless in the Last Year: No  Utilities: Not on file  Health Literacy: Not on file     Outpatient Medications Prior to Visit  Medication Sig Dispense Refill   ALPRAZolam  (XANAX ) 0.5 MG tablet Take 0.5 mg by mouth at bedtime as needed for anxiety (panic attack).      amphetamine -dextroamphetamine  (ADDERALL  XR) 15 MG 24 hr capsule Take 15 mg by mouth daily before lunch.     amphetamine -dextroamphetamine  (ADDERALL  XR) 30 MG 24 hr capsule Take 30 mg by mouth daily at 6 (six) AM.      aspirin  EC 81 MG tablet Take 1 tablet (81 mg total) by mouth daily. Swallow whole. 30 tablet 11   atorvastatin  (LIPITOR) 40 MG tablet TAKE 1 TABLET DAILY 90 tablet 3   Blood Glucose Monitoring Suppl (ONETOUCH VERIO FLEX SYSTEM) w/Device KIT USE TO CHECK BLOOD SUGAR TWICE A DAY (DX. E11.9) 1 kit 0   cariprazine  (VRAYLAR ) capsule Take 3 mg by mouth every 3 (three) days.      Cholecalciferol (VITAMIN D3) 125 MCG (5000 UT) CAPS Take 5,000 Units by mouth daily.     citalopram  (CELEXA ) 40 MG tablet Take 40 mg by mouth at bedtime.      cyanocobalamin  (,VITAMIN B-12,) 1000 MCG/ML injection Inject 1 mL (1,000 mcg total) into the muscle every 3 (three) months. every 8 weeks 1 mL 3   doxepin  (SINEQUAN ) 50 MG capsule Take 50 mg by mouth at bedtime.   1   ferrous sulfate 325 (65 FE) MG tablet Take 325 mg by mouth daily with breakfast.     fluticasone  (FLONASE ) 50 MCG/ACT nasal spray SPRAY 2 SPRAYS INTO EACH  NOSTRIL EVERY DAY 48 mL 2   gabapentin  (NEURONTIN ) 300 MG capsule TAKE 1 CAPSULE TWICE DAILY 180 capsule 1   glucose blood (ONETOUCH VERIO) test strip Use to check blood sugar twice daily 200 strip 3   Lancets (ONETOUCH DELICA PLUS LANCET33G) MISC USE TO CHECK BLOOD SUGAR TWICE A DAY 200 each 1   levothyroxine  (SYNTHROID ) 88 MCG tablet Take 1 tablet (88 mcg total) by mouth daily. 90 tablet 0   loratadine  (CLARITIN ) 10 MG tablet TAKE 1 TABLET BY MOUTH EVERY DAY 90 tablet 1   losartan  (COZAAR ) 25 MG tablet TAKE 1 TABLET DAILY 90 tablet 3   metoprolol  succinate (TOPROL -XL) 25 MG 24 hr tablet Take 0.5 tablets (12.5 mg total) by mouth daily. 45 tablet 3   pantoprazole  (PROTONIX ) 40 MG tablet TAKE 1 TABLET EVERY MORNING. 90 tablet 3   tirzepatide  (MOUNJARO ) 15 MG/0.5ML Pen INJECT 15 MG INTO THE SKIN ONCE A WEEK. 2 mL 5   No facility-administered medications prior to visit.    Allergies[1]  ROS Review of Systems Negative unless indicated in HPI.    Objective:    Physical Exam Constitutional:      Appearance: Normal appearance.  HENT:     Mouth/Throat:     Mouth: Mucous membranes are moist.  Eyes:     Conjunctiva/sclera: Conjunctivae normal.     Pupils: Pupils are equal, round, and reactive to light.  Cardiovascular:  Rate and Rhythm: Normal rate and regular rhythm.     Pulses: Normal pulses.     Heart sounds: Normal heart sounds.  Pulmonary:     Effort: Pulmonary effort is normal.     Breath sounds: Normal breath sounds.  Musculoskeletal:     Comments: Well-defined thickened area of hyperkeratotic skin on left foot.  With surrounding dryness on the left lateral foot.  Skin:    General: Skin is warm.     Findings: No bruising.  Neurological:     General: No focal deficit present.     Mental Status: She is alert and oriented to person, place, and time. Mental status is at baseline.  Psychiatric:        Mood and Affect: Mood normal.        Behavior: Behavior normal.         Thought Content: Thought content normal.     BP 114/68   Pulse 77   Temp 98.1 F (36.7 C)   Ht 5' 2 (1.575 m)   Wt 176 lb 6.4 oz (80 kg)   LMP 07/15/2006   SpO2 99%   BMI 32.26 kg/m  Wt Readings from Last 3 Encounters:  01/25/24 176 lb 6.4 oz (80 kg)  11/06/23 174 lb 2 oz (79 kg)  10/22/23 175 lb (79.4 kg)     Health Maintenance  Topic Date Due   Zoster Vaccines- Shingrix (1 of 2) Never done   COVID-19 Vaccine (5 - 2025-26 season) 10/15/2023   Mammogram  12/13/2023   FOOT EXAM  04/17/2024   HEMOGLOBIN A1C  05/05/2024   Diabetic kidney evaluation - Urine ACR  07/16/2024   Diabetic kidney evaluation - eGFR measurement  11/05/2024   OPHTHALMOLOGY EXAM  01/07/2025   DTaP/Tdap/Td (3 - Td or Tdap) 08/03/2025   Colonoscopy  10/21/2028   Pneumococcal Vaccine: 50+ Years  Completed   Influenza Vaccine  Completed   Hepatitis C Screening  Completed   HIV Screening  Completed   Hepatitis B Vaccines 19-59 Average Risk  Aged Out   HPV VACCINES  Aged Out   Meningococcal B Vaccine  Aged Out    There are no preventive care reminders to display for this patient.  Lab Results  Component Value Date   TSH 0.47 12/13/2023   Lab Results  Component Value Date   WBC 6.4 07/13/2023   HGB 11.6 (L) 07/13/2023   HCT 35.8 (L) 07/13/2023   MCV 93.0 07/13/2023   PLT 230.0 07/13/2023   Lab Results  Component Value Date   NA 138 11/06/2023   K 4.6 11/06/2023   CO2 26 11/06/2023   GLUCOSE 128 (H) 11/06/2023   BUN 26 (H) 11/06/2023   CREATININE 1.18 11/06/2023   BILITOT 0.5 11/06/2023   ALKPHOS 70 11/06/2023   AST 15 11/06/2023   ALT 12 11/06/2023   PROT 6.3 11/06/2023   ALBUMIN 4.0 11/06/2023   CALCIUM  9.6 11/06/2023   ANIONGAP 15 06/03/2019   EGFR 44 (L) 12/02/2021   GFR 50.06 (L) 11/06/2023   Lab Results  Component Value Date   CHOL 127 07/13/2023   Lab Results  Component Value Date   HDL 47.20 07/13/2023   Lab Results  Component Value Date   LDLCALC 60 07/13/2023    Lab Results  Component Value Date   TRIG 97.0 07/13/2023   Lab Results  Component Value Date   CHOLHDL 3 07/13/2023   Lab Results  Component Value Date   HGBA1C 6.3 (A)  11/06/2023      Assessment & Plan:   Assessment & Plan Callus Painful callus on left foot with redness and swelling, no infection. Advised against self-treatment due to diabetes. - Referred to podiatry for evaluation and management. - Advised soaking feet in warm Epsom salt water. - Recommended wearing soft, loose-fitting shoes. - Suggested removing shoes periodically to rest feet. - Provided foot care instructions. Orders:   Ambulatory referral to Podiatry   Assessment and Plan Assessment & Plan    Follow-up: Return PCP if symptoms worsen or fail to improve.   Lateefah Mallery, NP     [1]  Allergies Allergen Reactions   Mirabegron Other (See Comments)    Trouble breathing and swollen tongue   Keflex [Cephalexin] Nausea And Vomiting and Other (See Comments)    Light-headed/dizziness/weakness   Ace Inhibitors Cough

## 2024-01-25 NOTE — Patient Instructions (Signed)
 Corns and Calluses: What to Know     Corns and calluses are skin conditions that can cause discomfort. Corns are small areas of thickened skin that usually form on a toe. They have a cone-shaped core that can press on nerves, causing pain. Calluses are larger areas of thickened skin that can form anywhere on the body. They often show up on the hands, soles of the feet, and heels. Calluses don't have a pointy core like corns do. What are the causes? Corns and calluses are caused by rubbing or pressure. Tight or bad-fitting shoes are often what cause these things. What increases the risk? Corns Corns are more likely to develop in people who have misshapen toes, such as hammertoes. Calluses Calluses are more likely to develop in people who: Work with their hands. Wear shoes that fit poorly, are too tight, or have high heels. Have misshapen toes. Some conditions make you more likely to get calluses and can lead to problems like ulceration or infection. These include: Diabetes. Poor blood flow. Numbness in your feet. What are the signs or symptoms? Symptoms of a corn or callus include: A hard growth on the skin. Pain or tenderness under the skin. Redness and swelling. More discomfort when wearing tight shoes, if your feet are affected. If a corn or callus becomes infected, symptoms may include: Redness and swelling that gets worse. Pain. Fluid, blood, or pus coming out of the corn or callus. How is this diagnosed? Corns and calluses may be diagnosed based on your: Symptoms. Medical history. Physical exam. How is this treated? Treatment for corns and calluses may include: Fixing the cause, like changing shoes, wearing gloves, or using shoe inserts or protective pads. Applying lotion to soften the skin. This may include using a medicated moisturizer. Gently removing dead layers of skin. Removing the corn or callus with a scalpel or laser. This is done by a health care provider who  specializes in skin or feet (dermatologist or podiatrist). Taking antibiotics, if it's infected. Having surgery for a misshapen toe. Follow these instructions at home:  Take your medicine only as told. If you were given antibiotics, take them as told. Do not stop taking them even if you start to feel better. Wear shoes that fit well. Avoid wearing shoes that have high heels or are too tight or too loose. Wear padding, protective layers, gloves, or orthotics as told. Soak your hands or feet and file the corn or callus with a file or pumice stone. Do this as told by your provider. Check your corn or callus every day for signs of infection. Contact a health care provider if: Your symptoms don't get better with treatment. You have redness or swelling that gets worse. Your corn or callus is painful. You have fluid, blood, or pus coming from your corn or callus. You have new symptoms. Get help right away if: You have very bad pain with redness. This information is not intended to replace advice given to you by your health care provider. Make sure you discuss any questions you have with your health care provider. Document Revised: 07/27/2022 Document Reviewed: 07/27/2022 Elsevier Patient Education  2024 [employer].

## 2024-01-25 NOTE — Telephone Encounter (Signed)
 FYI Only or Action Required?: FYI only for provider: appointment scheduled on 01/25/24.  Patient was last seen in primary care on 11/06/2023 by Randeen Laine LABOR, MD.  Called Nurse Triage reporting Foot Pain.  Symptoms began about a month ago.  Interventions attempted: Other: OTC callus shaver.  Symptoms are: gradually worsening.  Triage Disposition: See Physician Within 24 Hours  Patient/caregiver understands and will follow disposition?: Yes                Copied from CRM #[id number]. Topic: Clinical - Red Word Triage >> Jan 25, 2024  8:57 AM Emylou G wrote: [facility] that prompted transfer to Nurse Triage:  place on side of foot that is in pain ( callous or corn) by the end of day have hard time taking off shoe - she is diabetic.SABRA Reason for Disposition  Foot or toe pain  Answer Assessment - Initial Assessment Questions 1. SYMPTOM: What's the main symptom you're concerned about? (e.g., rash, sore, callus, drainage, numbness)     Callus and pain, with redness.  2. LOCATION: Where is the  protrusion and pain located? (e.g., foot/toe, top/bottom, left/right)     Left lateral foot side of pinky toe.  3. APPEARANCE: What does the area look like? (e.g., normal, red, swollen; size)     Red, larger than a pea.  4. ONSET: When did the  symptoms  start?     X1 month, gradually worsening.  5. PAIN: Is there any pain? If Yes, ask: How bad is it? (Scale: 0-10; none, mild, moderate, severe)     Yes, when walking 7/10 and if putting pressure 8-9/10.  6. CAUSE: What do you think is causing the symptoms?     Corn or callus.  7. BLOOD GLUCOSE: What is your blood glucose level?      Last checked a week ago was 106, battery died and has not checked.  8. USUAL RANGE: What is your blood glucose level usually? (e.g., usual fasting morning value, usual evening value)     90-100s.  9. OTHER SYMPTOMS: Do you have any other symptoms? (e.g., fever, weakness)     No  fever, red streaks, cold or blue look to foot.  Protocols used: Diabetes - Foot Problems and Questions-A-AH

## 2024-02-01 ENCOUNTER — Ambulatory Visit

## 2024-02-04 ENCOUNTER — Ambulatory Visit: Admitting: Podiatry

## 2024-02-04 ENCOUNTER — Encounter: Payer: Self-pay | Admitting: Podiatry

## 2024-02-04 VITALS — Ht 62.0 in | Wt 176.4 lb

## 2024-02-04 DIAGNOSIS — D2372 Other benign neoplasm of skin of left lower limb, including hip: Secondary | ICD-10-CM | POA: Diagnosis not present

## 2024-02-04 NOTE — Progress Notes (Signed)
 "  Chief Complaint  Patient presents with   Plantar Warts    Pt is here due to possible plantar wart on the side of the left foot that is causing some pain.    Subjective: 63 y.o. female presenting to the office today for evaluation of a symptomatic skin lesion to the plantar aspect of the left foot   Past Medical History:  Diagnosis Date   Allergy 2013   When took medicine for overactive bladder   Anemia    Anxiety    Arthritis    Bipolar 1 disorder (HCC)    Blood transfusion without reported diagnosis 12/2018   Cancer (HCC) 2010   sigmoid colon cancer   Chronic kidney disease    Clotting disorder 12/2018   Bleeding ulcer   Depression    Diabetes mellitus    type II   Endometriosis    Family history of malignant neoplasm of gastrointestinal tract    Ganglion cyst    GERD (gastroesophageal reflux disease)    Hyperlipidemia    Bleeding ulcer   Hypothyroidism    Insomnia due to mental condition    Irregular heart beat    Myocardial infarction (HCC) 2023   Nausea & vomiting 12/29/2018   Osteoporosis 01/17/2023   Oxygen deficiency    Sleep apnea    Ulcer 12/2018   Bleeding ulcer    Past Surgical History:  Procedure Laterality Date   ABDOMINAL HYSTERECTOMY  2008   BIOPSY  12/31/2018   Procedure: BIOPSY;  Surgeon: Legrand Victory LITTIE DOUGLAS, MD;  Location: [facility] ENDOSCOPY;  Service: Gastroenterology;;   BREAST SURGERY     Had a small lump in left breast checked. Was not cancer   COLON RESECTION  07/14/2008   COLON SURGERY  2010   COLONOSCOPY     DILATION AND CURETTAGE OF UTERUS  02/14/2004   miscarriage    ESOPHAGOGASTRODUODENOSCOPY N/A 12/31/2018   Procedure: ESOPHAGOGASTRODUODENOSCOPY (EGD);  Surgeon: Legrand Victory LITTIE DOUGLAS, MD;  Location: [facility] ENDOSCOPY;  Service: Gastroenterology;  Laterality: N/A;   GANGLION CYST EXCISION     HERNIA REPAIR     Hernia after colon cancer   INTERSTIM IMPLANT PLACEMENT Left 12/11/2012   stimulator is on the left but the electrodes go to the  right   KNEE DISLOCATION SURGERY     LAPAROSCOPY  02/14/1995   D & C for endometriosis    Allergies[1]   Objective:  Physical Exam General: Alert and oriented x3 in no acute distress  Dermatology: Hyperkeratotic lesion(s) present on the plantar aspect of the left foot. Pain on palpation with a central nucleated core noted. Skin is warm, dry and supple bilateral lower extremities. Negative for open lesions or macerations.  Vascular: Palpable pedal pulses bilaterally. No edema or erythema noted. Capillary refill within normal limits.  Neurological: Grossly intact via light touch  Musculoskeletal Exam: Pain on palpation at the keratotic lesion(s) noted. Range of motion within normal limits bilateral. Muscle strength 5/5 in all groups bilateral.  Assessment: 1.  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  -Salicylic acid applied with a bandaid -Return to the clinic PRN.   Thresa EMERSON Sar, DPM [facility]  Dr. Thresa EMERSON Sar, DPM    [address]  [place], [place] [address]                Office [phone]  Fax [phone]         [1]  Allergies Allergen Reactions   Mirabegron Other (See Comments)    Trouble breathing and swollen tongue   Keflex [Cephalexin] Nausea And Vomiting and Other (See Comments)    Light-headed/dizziness/weakness   Ace Inhibitors Cough   "

## 2024-02-06 ENCOUNTER — Other Ambulatory Visit: Payer: Self-pay | Admitting: Internal Medicine

## 2024-02-06 ENCOUNTER — Other Ambulatory Visit: Payer: Self-pay | Admitting: Family Medicine

## 2024-02-06 ENCOUNTER — Other Ambulatory Visit: Payer: Self-pay | Admitting: Gastroenterology

## 2024-02-06 DIAGNOSIS — Z85038 Personal history of other malignant neoplasm of large intestine: Secondary | ICD-10-CM

## 2024-02-08 NOTE — Telephone Encounter (Signed)
 Zofran  was d/c off med list in Sept,

## 2024-03-06 ENCOUNTER — Ambulatory Visit: Payer: 59 | Admitting: "Endocrinology
